# Patient Record
Sex: Female | Born: 1945 | Race: White | Hispanic: No | Marital: Married | Smoking: Former smoker
Health system: Southern US, Community
[De-identification: ages and names within clinical notes are randomized; demographics above are authoritative.]

## PROBLEM LIST (undated history)

## (undated) DIAGNOSIS — F32A Depression, unspecified: Secondary | ICD-10-CM

## (undated) DIAGNOSIS — M1990 Unspecified osteoarthritis, unspecified site: Secondary | ICD-10-CM

## (undated) DIAGNOSIS — Z973 Presence of spectacles and contact lenses: Secondary | ICD-10-CM

## (undated) DIAGNOSIS — F419 Anxiety disorder, unspecified: Secondary | ICD-10-CM

## (undated) DIAGNOSIS — E785 Hyperlipidemia, unspecified: Secondary | ICD-10-CM

## (undated) DIAGNOSIS — K5730 Diverticulosis of large intestine without perforation or abscess without bleeding: Secondary | ICD-10-CM

## (undated) DIAGNOSIS — K589 Irritable bowel syndrome without diarrhea: Secondary | ICD-10-CM

## (undated) DIAGNOSIS — I671 Cerebral aneurysm, nonruptured: Secondary | ICD-10-CM

## (undated) DIAGNOSIS — M1712 Unilateral primary osteoarthritis, left knee: Secondary | ICD-10-CM

## (undated) DIAGNOSIS — F329 Major depressive disorder, single episode, unspecified: Secondary | ICD-10-CM

## (undated) HISTORY — PX: VAGINAL HYSTERECTOMY: SUR661

## (undated) HISTORY — PX: TOTAL KNEE ARTHROPLASTY: SHX125

## (undated) HISTORY — PX: COLONOSCOPY: SHX174

## (undated) HISTORY — PX: TONSILLECTOMY AND ADENOIDECTOMY: SUR1326

## (undated) HISTORY — DX: Unspecified osteoarthritis, unspecified site: M19.90

## (undated) HISTORY — DX: Hyperlipidemia, unspecified: E78.5

## (undated) HISTORY — DX: Major depressive disorder, single episode, unspecified: F32.9

## (undated) HISTORY — DX: Depression, unspecified: F32.A

## (undated) HISTORY — PX: LUMBAR FUSION: SHX111

## (undated) HISTORY — DX: Anxiety disorder, unspecified: F41.9

## (undated) HISTORY — PX: ANEURYSM COILING: SHX5349

## (undated) HISTORY — PX: RECTAL PROLAPSE REPAIR: SHX759

---

## 1999-01-30 ENCOUNTER — Other Ambulatory Visit: Admission: RE | Admit: 1999-01-30 | Discharge: 1999-01-30 | Payer: Self-pay | Admitting: Family Medicine

## 2000-08-18 ENCOUNTER — Other Ambulatory Visit: Admission: RE | Admit: 2000-08-18 | Discharge: 2000-08-18 | Payer: Self-pay | Admitting: Family Medicine

## 2001-04-27 ENCOUNTER — Encounter: Admission: RE | Admit: 2001-04-27 | Discharge: 2001-04-27 | Payer: Self-pay | Admitting: Family Medicine

## 2001-04-27 ENCOUNTER — Encounter: Payer: Self-pay | Admitting: Family Medicine

## 2003-03-21 ENCOUNTER — Other Ambulatory Visit: Admission: RE | Admit: 2003-03-21 | Discharge: 2003-03-21 | Payer: Self-pay | Admitting: Family Medicine

## 2004-03-15 DIAGNOSIS — I671 Cerebral aneurysm, nonruptured: Secondary | ICD-10-CM

## 2004-03-15 HISTORY — DX: Cerebral aneurysm, nonruptured: I67.1

## 2004-08-27 ENCOUNTER — Ambulatory Visit: Payer: Self-pay | Admitting: Family Medicine

## 2004-09-02 ENCOUNTER — Encounter: Admission: RE | Admit: 2004-09-02 | Discharge: 2004-09-02 | Payer: Self-pay | Admitting: Family Medicine

## 2004-09-03 ENCOUNTER — Ambulatory Visit: Payer: Self-pay | Admitting: Family Medicine

## 2004-09-03 ENCOUNTER — Encounter: Admission: RE | Admit: 2004-09-03 | Discharge: 2004-09-03 | Payer: Self-pay | Admitting: Family Medicine

## 2004-12-03 ENCOUNTER — Ambulatory Visit: Payer: Self-pay | Admitting: Family Medicine

## 2004-12-23 ENCOUNTER — Encounter: Payer: Self-pay | Admitting: Family Medicine

## 2005-01-08 ENCOUNTER — Ambulatory Visit: Payer: Self-pay | Admitting: Family Medicine

## 2005-01-12 ENCOUNTER — Ambulatory Visit: Payer: Self-pay | Admitting: Family Medicine

## 2005-01-16 ENCOUNTER — Encounter: Admission: RE | Admit: 2005-01-16 | Discharge: 2005-01-16 | Payer: Self-pay | Admitting: Family Medicine

## 2005-01-21 ENCOUNTER — Ambulatory Visit (HOSPITAL_COMMUNITY): Admission: RE | Admit: 2005-01-21 | Discharge: 2005-01-22 | Payer: Self-pay | Admitting: Interventional Radiology

## 2005-04-06 ENCOUNTER — Encounter: Payer: Self-pay | Admitting: Family Medicine

## 2005-04-06 ENCOUNTER — Ambulatory Visit: Payer: Self-pay | Admitting: Family Medicine

## 2005-04-07 ENCOUNTER — Encounter: Payer: Self-pay | Admitting: Family Medicine

## 2005-04-07 ENCOUNTER — Other Ambulatory Visit: Admission: RE | Admit: 2005-04-07 | Discharge: 2005-04-07 | Payer: Self-pay | Admitting: Family Medicine

## 2005-04-07 LAB — CONVERTED CEMR LAB

## 2005-05-18 ENCOUNTER — Ambulatory Visit (HOSPITAL_COMMUNITY): Admission: RE | Admit: 2005-05-18 | Discharge: 2005-05-18 | Payer: Self-pay | Admitting: Interventional Radiology

## 2005-05-25 ENCOUNTER — Ambulatory Visit: Payer: Self-pay | Admitting: Family Medicine

## 2005-06-07 ENCOUNTER — Inpatient Hospital Stay (HOSPITAL_COMMUNITY): Admission: RE | Admit: 2005-06-07 | Discharge: 2005-06-11 | Payer: Self-pay | Admitting: Orthopedic Surgery

## 2005-07-14 ENCOUNTER — Ambulatory Visit: Payer: Self-pay | Admitting: Family Medicine

## 2005-07-28 ENCOUNTER — Ambulatory Visit: Payer: Self-pay | Admitting: Family Medicine

## 2005-10-27 ENCOUNTER — Ambulatory Visit: Payer: Self-pay | Admitting: Family Medicine

## 2006-01-07 ENCOUNTER — Ambulatory Visit (HOSPITAL_COMMUNITY): Admission: RE | Admit: 2006-01-07 | Discharge: 2006-01-07 | Payer: Self-pay | Admitting: Interventional Radiology

## 2006-01-19 ENCOUNTER — Ambulatory Visit: Payer: Self-pay | Admitting: Family Medicine

## 2006-01-26 ENCOUNTER — Ambulatory Visit: Payer: Self-pay | Admitting: Family Medicine

## 2006-01-26 LAB — CONVERTED CEMR LAB
AST: 34 units/L (ref 0–37)
BUN: 16 mg/dL (ref 6–23)
Basophils Relative: 1 % (ref 0.0–1.0)
Calcium: 9.5 mg/dL (ref 8.4–10.5)
Chloride: 103 meq/L (ref 96–112)
Cholesterol: 191 mg/dL (ref 0–200)
Creatinine, Ser: 0.8 mg/dL (ref 0.4–1.2)
Eosinophil percent: 1.7 % (ref 0.0–5.0)
HCT: 41.5 % (ref 36.0–46.0)
HDL: 74 mg/dL (ref >39.0)
Hemoglobin: 13.9 g/dL (ref 12.0–15.0)
Lymphocytes Relative: 31.7 % (ref 12.0–46.0)
MCHC: 33.4 g/dL (ref 30.0–36.0)
MCV: 98.7 fL (ref 78.0–100.0)
Monocytes Absolute: 0.5 10*3/uL (ref 0.2–0.7)
Neutro Abs: 3.5 10*3/uL (ref 1.4–7.7)
Neutrophils Relative %: 56.9 % (ref 43.0–77.0)
Triglyceride fasting, serum: 67 mg/dL (ref 0–149)
VLDL: 13 mg/dL (ref 0–40)

## 2006-02-09 ENCOUNTER — Ambulatory Visit: Payer: Self-pay | Admitting: Family Medicine

## 2006-04-13 ENCOUNTER — Ambulatory Visit: Payer: Self-pay | Admitting: Family Medicine

## 2006-04-13 LAB — CONVERTED CEMR LAB
Total CHOL/HDL Ratio: 3.1
Triglycerides: 83 mg/dL (ref 0–149)
VLDL: 17 mg/dL (ref 0–40)

## 2006-04-20 ENCOUNTER — Ambulatory Visit: Payer: Self-pay | Admitting: Family Medicine

## 2006-08-02 ENCOUNTER — Ambulatory Visit (HOSPITAL_COMMUNITY): Admission: RE | Admit: 2006-08-02 | Discharge: 2006-08-02 | Payer: Self-pay | Admitting: Interventional Radiology

## 2006-09-01 ENCOUNTER — Ambulatory Visit: Payer: Self-pay | Admitting: Family Medicine

## 2006-09-20 ENCOUNTER — Encounter: Payer: Self-pay | Admitting: Family Medicine

## 2006-10-21 ENCOUNTER — Ambulatory Visit: Payer: Self-pay | Admitting: Family Medicine

## 2006-10-26 ENCOUNTER — Encounter: Payer: Self-pay | Admitting: Family Medicine

## 2006-10-27 LAB — CONVERTED CEMR LAB
Cholesterol: 230 mg/dL (ref 0–200)
VLDL: 12 mg/dL (ref 0–40)

## 2006-11-02 ENCOUNTER — Telehealth: Payer: Self-pay | Admitting: Family Medicine

## 2007-01-24 ENCOUNTER — Telehealth: Payer: Self-pay | Admitting: Family Medicine

## 2007-02-17 ENCOUNTER — Ambulatory Visit: Payer: Self-pay | Admitting: Family Medicine

## 2007-02-17 LAB — CONVERTED CEMR LAB
Glucose, Urine, Semiquant: NEGATIVE
Ketones, urine, test strip: NEGATIVE
Urobilinogen, UA: 0.2
pH: 6

## 2007-02-28 ENCOUNTER — Encounter: Payer: Self-pay | Admitting: Family Medicine

## 2007-02-28 ENCOUNTER — Other Ambulatory Visit: Admission: RE | Admit: 2007-02-28 | Discharge: 2007-02-28 | Payer: Self-pay | Admitting: Family Medicine

## 2007-02-28 ENCOUNTER — Ambulatory Visit: Payer: Self-pay | Admitting: Family Medicine

## 2007-02-28 DIAGNOSIS — M67919 Unspecified disorder of synovium and tendon, unspecified shoulder: Secondary | ICD-10-CM | POA: Insufficient documentation

## 2007-02-28 DIAGNOSIS — M719 Bursopathy, unspecified: Secondary | ICD-10-CM

## 2007-02-28 LAB — CONVERTED CEMR LAB
Albumin: 4.3 g/dL (ref 3.5–5.2)
Alkaline Phosphatase: 47 units/L (ref 39–117)
BUN: 13 mg/dL (ref 6–23)
Basophils Absolute: 0 10*3/uL (ref 0.0–0.1)
Cholesterol: 174 mg/dL (ref 0–200)
Creatinine, Ser: 0.6 mg/dL (ref 0.4–1.2)
Eosinophils Absolute: 0.1 10*3/uL (ref 0.0–0.6)
GFR calc Af Amer: 132 mL/min
GFR calc non Af Amer: 109 mL/min
HCT: 39 % (ref 36.0–46.0)
HDL: 79.5 mg/dL (ref >39.0)
Hemoglobin: 13.4 g/dL (ref 12.0–15.0)
LDL Cholesterol: 84 mg/dL (ref 0–99)
Lymphocytes Relative: 31.3 % (ref 12.0–46.0)
MCHC: 34.3 g/dL (ref 30.0–36.0)
MCV: 96.5 fL (ref 78.0–100.0)
Monocytes Absolute: 0.4 10*3/uL (ref 0.2–0.7)
Monocytes Relative: 7.5 % (ref 3.0–11.0)
Neutro Abs: 3.5 10*3/uL (ref 1.4–7.7)
Neutrophils Relative %: 59.3 % (ref 43.0–77.0)
Potassium: 4.6 meq/L (ref 3.5–5.1)
RDW: 11.7 % (ref 11.5–14.6)
Sodium: 141 meq/L (ref 135–145)
TSH: 1.31 microintl units/mL (ref 0.35–5.50)
Total Bilirubin: 0.7 mg/dL (ref 0.3–1.2)

## 2007-03-07 ENCOUNTER — Encounter: Payer: Self-pay | Admitting: Family Medicine

## 2007-05-18 ENCOUNTER — Encounter: Payer: Self-pay | Admitting: Family Medicine

## 2007-05-18 ENCOUNTER — Encounter: Admission: RE | Admit: 2007-05-18 | Discharge: 2007-05-18 | Payer: Self-pay | Admitting: Family Medicine

## 2007-05-24 ENCOUNTER — Telehealth: Payer: Self-pay | Admitting: Family Medicine

## 2007-10-10 ENCOUNTER — Encounter: Payer: Self-pay | Admitting: Family Medicine

## 2007-10-10 ENCOUNTER — Ambulatory Visit: Payer: Self-pay | Admitting: Family Medicine

## 2007-10-10 ENCOUNTER — Other Ambulatory Visit: Admission: RE | Admit: 2007-10-10 | Discharge: 2007-10-10 | Payer: Self-pay | Admitting: Family Medicine

## 2007-11-08 ENCOUNTER — Telehealth: Payer: Self-pay | Admitting: Family Medicine

## 2007-12-19 ENCOUNTER — Telehealth: Payer: Self-pay | Admitting: Family Medicine

## 2008-03-27 ENCOUNTER — Telehealth: Payer: Self-pay | Admitting: Family Medicine

## 2008-03-29 ENCOUNTER — Telehealth: Payer: Self-pay | Admitting: Family Medicine

## 2008-03-29 ENCOUNTER — Ambulatory Visit: Payer: Self-pay | Admitting: Family Medicine

## 2008-04-03 ENCOUNTER — Ambulatory Visit: Payer: Self-pay | Admitting: Family Medicine

## 2008-04-03 ENCOUNTER — Encounter: Payer: Self-pay | Admitting: Family Medicine

## 2008-04-03 ENCOUNTER — Other Ambulatory Visit: Admission: RE | Admit: 2008-04-03 | Discharge: 2008-04-03 | Payer: Self-pay | Admitting: *Deleted

## 2008-04-03 ENCOUNTER — Telehealth: Payer: Self-pay

## 2008-04-03 DIAGNOSIS — S335XXA Sprain of ligaments of lumbar spine, initial encounter: Secondary | ICD-10-CM

## 2008-04-03 DIAGNOSIS — S339XXA Sprain of unspecified parts of lumbar spine and pelvis, initial encounter: Secondary | ICD-10-CM | POA: Insufficient documentation

## 2008-04-04 LAB — CONVERTED CEMR LAB
Albumin: 4.2 g/dL (ref 3.5–5.2)
BUN: 21 mg/dL (ref 6–23)
Basophils Absolute: 0 10*3/uL (ref 0.0–0.1)
Basophils Relative: 1.1 % (ref 0.0–3.0)
Calcium: 9.6 mg/dL (ref 8.4–10.5)
Cholesterol: 180 mg/dL (ref 0–200)
Creatinine, Ser: 0.7 mg/dL (ref 0.4–1.2)
Eosinophils Absolute: 0.1 10*3/uL (ref 0.0–0.7)
Eosinophils Relative: 3.3 % (ref 0.0–5.0)
GFR calc Af Amer: 109 mL/min
GFR calc non Af Amer: 90 mL/min
HCT: 38.4 % (ref 36.0–46.0)
HDL: 82.7 mg/dL (ref >39.0)
MCHC: 34.4 g/dL (ref 30.0–36.0)
MCV: 99.3 fL (ref 78.0–100.0)
Monocytes Absolute: 0.3 10*3/uL (ref 0.1–1.0)
Platelets: 251 10*3/uL (ref 150–400)
RBC: 3.86 M/uL — ABNORMAL LOW (ref 3.87–5.11)
TSH: 0.94 microintl units/mL (ref 0.35–5.50)
Total Bilirubin: 0.8 mg/dL (ref 0.3–1.2)
WBC: 4.5 10*3/uL (ref 4.5–10.5)

## 2008-04-09 ENCOUNTER — Encounter: Payer: Self-pay | Admitting: Family Medicine

## 2008-05-29 ENCOUNTER — Ambulatory Visit: Payer: Self-pay | Admitting: Family Medicine

## 2008-06-13 ENCOUNTER — Telehealth: Payer: Self-pay | Admitting: Family Medicine

## 2008-06-20 ENCOUNTER — Ambulatory Visit: Payer: Self-pay | Admitting: Family Medicine

## 2008-06-20 ENCOUNTER — Telehealth: Payer: Self-pay | Admitting: Family Medicine

## 2008-06-20 DIAGNOSIS — J069 Acute upper respiratory infection, unspecified: Secondary | ICD-10-CM | POA: Insufficient documentation

## 2008-06-21 ENCOUNTER — Ambulatory Visit: Payer: Self-pay | Admitting: Family Medicine

## 2008-06-27 ENCOUNTER — Ambulatory Visit: Payer: Self-pay | Admitting: Internal Medicine

## 2008-07-03 ENCOUNTER — Telehealth: Payer: Self-pay | Admitting: Family Medicine

## 2008-07-03 LAB — CONVERTED CEMR LAB
Bilirubin Urine: NEGATIVE
Blood in Urine, dipstick: NEGATIVE
Glucose, Urine, Semiquant: NEGATIVE
Protein, U semiquant: NEGATIVE
Specific Gravity, Urine: 1.02
pH: 7

## 2008-07-09 ENCOUNTER — Encounter: Payer: Self-pay | Admitting: Family Medicine

## 2008-07-10 ENCOUNTER — Encounter: Payer: Self-pay | Admitting: Family Medicine

## 2008-07-16 ENCOUNTER — Encounter: Payer: Self-pay | Admitting: Family Medicine

## 2008-08-09 ENCOUNTER — Encounter: Payer: Self-pay | Admitting: Family Medicine

## 2008-10-25 ENCOUNTER — Telehealth: Payer: Self-pay | Admitting: Family Medicine

## 2008-10-29 ENCOUNTER — Encounter: Payer: Self-pay | Admitting: Family Medicine

## 2008-10-29 ENCOUNTER — Encounter: Admission: RE | Admit: 2008-10-29 | Discharge: 2008-10-29 | Payer: Self-pay | Admitting: Internal Medicine

## 2008-11-06 ENCOUNTER — Ambulatory Visit: Payer: Self-pay | Admitting: Family Medicine

## 2008-11-06 DIAGNOSIS — M109 Gout, unspecified: Secondary | ICD-10-CM | POA: Insufficient documentation

## 2009-03-31 ENCOUNTER — Ambulatory Visit: Payer: Self-pay | Admitting: Family Medicine

## 2009-04-15 ENCOUNTER — Other Ambulatory Visit: Admission: RE | Admit: 2009-04-15 | Discharge: 2009-04-15 | Payer: Self-pay | Admitting: Internal Medicine

## 2009-04-15 ENCOUNTER — Ambulatory Visit: Payer: Self-pay | Admitting: Family Medicine

## 2009-04-15 DIAGNOSIS — B369 Superficial mycosis, unspecified: Secondary | ICD-10-CM | POA: Insufficient documentation

## 2009-04-15 DIAGNOSIS — N6019 Diffuse cystic mastopathy of unspecified breast: Secondary | ICD-10-CM | POA: Insufficient documentation

## 2009-04-15 LAB — HM PAP SMEAR

## 2009-04-24 LAB — CONVERTED CEMR LAB
Albumin: 4.5 g/dL (ref 3.5–5.2)
Basophils Relative: 1.2 % (ref 0.0–3.0)
Bilirubin, Direct: 0.1 mg/dL (ref 0.0–0.3)
CO2: 29 meq/L (ref 19–32)
Chloride: 104 meq/L (ref 96–112)
Cholesterol: 187 mg/dL (ref 0–200)
Creatinine, Ser: 0.7 mg/dL (ref 0.4–1.2)
Eosinophils Absolute: 0.1 10*3/uL (ref 0.0–0.7)
Glucose, Bld: 92 mg/dL (ref 70–99)
Glucose, Urine, Semiquant: NEGATIVE
MCHC: 33.2 g/dL (ref 30.0–36.0)
MCV: 101 fL — ABNORMAL HIGH (ref 78.0–100.0)
Monocytes Absolute: 0.3 10*3/uL (ref 0.1–1.0)
Neutro Abs: 2.6 10*3/uL (ref 1.4–7.7)
Neutrophils Relative %: 59.3 % (ref 43.0–77.0)
Nitrite: NEGATIVE
Pap Smear: NEGATIVE
RBC: 3.99 M/uL (ref 3.87–5.11)
Specific Gravity, Urine: 1.02
Total CHOL/HDL Ratio: 2
Total Protein: 6.8 g/dL (ref 6.0–8.3)
Triglycerides: 55 mg/dL (ref 0.0–149.0)
pH: 6.5

## 2009-05-01 ENCOUNTER — Ambulatory Visit: Payer: Self-pay | Admitting: Family Medicine

## 2009-05-21 ENCOUNTER — Telehealth: Payer: Self-pay | Admitting: Family Medicine

## 2009-05-29 ENCOUNTER — Telehealth: Payer: Self-pay | Admitting: Family Medicine

## 2009-06-03 ENCOUNTER — Telehealth: Payer: Self-pay | Admitting: Family Medicine

## 2009-10-09 ENCOUNTER — Ambulatory Visit: Payer: Self-pay | Admitting: Family Medicine

## 2009-10-09 DIAGNOSIS — T148XXA Other injury of unspecified body region, initial encounter: Secondary | ICD-10-CM | POA: Insufficient documentation

## 2009-11-23 ENCOUNTER — Ambulatory Visit (HOSPITAL_COMMUNITY): Admission: RE | Admit: 2009-11-23 | Discharge: 2009-11-23 | Payer: Self-pay | Admitting: Family Medicine

## 2009-11-24 ENCOUNTER — Telehealth (INDEPENDENT_AMBULATORY_CARE_PROVIDER_SITE_OTHER): Payer: Self-pay | Admitting: *Deleted

## 2010-02-02 ENCOUNTER — Telehealth: Payer: Self-pay | Admitting: Family Medicine

## 2010-03-26 ENCOUNTER — Encounter: Payer: Self-pay | Admitting: Family Medicine

## 2010-04-04 ENCOUNTER — Encounter: Payer: Self-pay | Admitting: Interventional Radiology

## 2010-04-06 ENCOUNTER — Encounter: Payer: Self-pay | Admitting: Internal Medicine

## 2010-04-14 NOTE — Assessment & Plan Note (Signed)
Summary: 2 wk rov/jlp   Vital Signs:  Patient profile:   65 year old female Menstrual status:  partial hysterectomy Weight:      117 pounds O2 Sat:      98 % on Room air Temp:     98.3 degrees F oral Pulse rate:   74 / minute Pulse rhythm:   regular BP sitting:   132 / 72  (left arm) Cuff size:   regular  O2 Flow:  Room air CC: check her breast, bump on her bottom & left shoulder pain Is Patient Diabetic? No     Menstrual Status partial hysterectomy Last PAP Result NEGATIVE FOR INTRAEPITHELIAL LESIONS OR MALIGNANCY.   History of Present Illness: This 65 year old white female returned for reexamination of the breast, 2 weeks previously she was found to have a cystic mastitis in the left breast and has returned for followup examination. She states she is improved and almost no tenderness at this time. The problem with the cellulitis on the left labia has resolved according to patient Her other complaint at this time is pain in the left shoulder on movement but also causes pain at night Headaches are much improved and less frequent Irritable bowel syndrome is well controlled with medication, hyocyaminee  Allergies: 1)  ! 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 Anxiety obesity  Past Surgical History: Last updated: 09/20/2006 Total knee replacement Tonsillectomy Fx Ankle Caesarean section Rectal Prolapse  Risk Factors: Smoking Status: quit (09/20/2006)  Review of Systems  The patient denies anorexia, fever, weight loss, weight gain, vision loss, decreased hearing, hoarseness, chest pain, syncope, dyspnea on exertion, peripheral edema, prolonged cough, headaches, hemoptysis, abdominal pain, melena, hematochezia, severe indigestion/heartburn, hematuria, incontinence, genital sores, muscle weakness, suspicious skin lesions,  transient blindness, difficulty walking, depression, unusual weight change, abnormal bleeding, enlarged lymph nodes, angioedema, breast masses, and testicular masses.    Physical Exam  General:  Well-developed,well-nourished,in no acute distress; alert,appropriate and cooperative throughout examination Breasts:  breast are normal to examination bilaterally very minimal tenderness in her right lower quadrant of the left wrist but no evidence of fibrocystic breast Lungs:  Normal respiratory effort, chest expands symmetrically. Lungs are clear to auscultation, no crackles or wheezes. Heart:  Normal rate and regular rhythm. S1 and S2 normal without gallop, murmur, click, rub or other extra sounds. Genitalia:  absent uterus, no evidence of infection the lesion on the left labia   Impression & Recommendations:  Problem # 1:  FUNGAL DERMATITIS (ICD-111.9) Assessment Improved  The following medications were removed from the medication list:    Lotrisone 1-0.05 % Crea (Clotrimazole-betamethasone) .Marland Kitchen... 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 Take 1 three times a day as needed 2)  Aspirin 81 Mg Tbec (Aspirin) .... Take 1 tablet by mouth every morning 3)  Imipramine Hcl  50 Mg Tabs (Imipramine hcl) .... Take 3 at bedtime 4)  Lipitor 20 Mg Tabs (Atorvastatin calcium) .... Once daily 5)  Diphenoxylate-atropine 2.5-0.025 Mg Tabs (Diphenoxylate-atropine) .... 2 tablets now,then 2 tablets four times day for diarrhea 6)  Diclofenac Sodium 75 Mg Tbec (Diclofenac sodium) .Marland Kitchen.. 1 tab two times a day after meals for arthritis 7)  Premarin 0.625 Mg/gm Crea (Estrogens, conjugated) .... Insert 1/2 applicator hs and apply externally as instructed 8)  Lorcet 10/650 10-650 Mg Tabs (Hydrocodone-acetaminophen) .Marland Kitchen.. 1 q4h as needed pain 9)  Doxycycline Hyclate 100 Mg Caps (Doxycycline hyclate) .Marland Kitchen.. 1 two times a day for infection breast 10)  Hyoscyamine Sulfate Cr 0.375 Mg Xr12h-tab (Hyoscyamine sulfate) .Marland Kitchen.. 1 two times a day for ibs  Patient Instructions: 1)  cystic mastitis and breast have improved and no further medication is needed 2)  Local dermatitis and saline this improved 3)  Injected the left a.c. bursa with Depo-Medrol 120 mg +1.5 cc lidocaine 1% 4)  Return when needed   Orders Added: 1)  Est. Patient Level IV [68864] 2)  Joint Aspirate / Injection, Large [20610] 3)  Depo- Medrol 39m [J1030] 4)  Depo- Medrol 842m[J1040]

## 2010-04-14 NOTE — Progress Notes (Signed)
Summary: LMTCB  Phone Note Call from Patient   Caller: Patient Call For: Emeterio Reeve MD Summary of Call: pt left message on voice mail to speak to [facility].  Called her back and left message to call back Triage. [phone] Initial call taken by: Deanna Artis CMA,  May 21, 2009 11:41 AM  Follow-up for Phone Call        duplicate mess see next mess.  Follow-up by: Levora Angel, RN,  May 21, 2009 12:17 PM

## 2010-04-14 NOTE — Progress Notes (Signed)
  Phone Note Outgoing Call   Summary of Call: Per MD called pt and suggested that she come in and see Dr Charlett Blake or someone else at the office. Patient states she will call back tomorrow if she is not feeling any better. Initial call taken by: Gardenia Phlegm RMA,  November 24, 2009 3:19 PM

## 2010-04-14 NOTE — Assessment & Plan Note (Signed)
Summary: neck injury   Vital Signs:  Patient profile:   65 year old female Menstrual status:  partial hysterectomy Weight:      115 pounds Temp:     97.6 degrees F Pulse rate:   80 / minute BP sitting:   134 / 76  (left arm)  Vitals Entered By: Levora Angel, RN (October 09, 2009 2:23 PM) CC: earlier today was closing car trunk lid and it hit her on back of neck.   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 Continues to need to take diclofenac Problem of diarrhea and then much improved Continues to the alprazolam for stress of work and marital problems     Allergies: 1)  ! 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 Anxiety obesity  Past Surgical History: Last updated: 09/20/2006 Total knee replacement Tonsillectomy Fx Ankle Caesarean section Rectal Prolapse  Risk Factors: Smoking Status: quit (09/20/2006)  Review of Systems      See HPI  The patient denies anorexia, fever, weight loss, weight gain, vision loss, decreased hearing, hoarseness, chest pain, syncope, dyspnea on exertion, peripheral edema, prolonged cough, headaches, hemoptysis, abdominal pain, melena, hematochezia, severe indigestion/heartburn, hematuria, incontinence, genital sores, muscle weakness, suspicious skin lesions, transient blindness, difficulty walking, depression, unusual weight change, abnormal bleeding, enlarged lymph nodes, angioedema, breast masses, and testicular masses.    Physical Exam  General:  Well-developed,well-nourished,in no acute distress;  alert,appropriate and cooperative throughout examination Head:  Normocephalic and atraumatic without obvious abnormalities. No apparent alopecia or balding. Eyes:  No corneal or conjunctival inflammation noted. EOMI. Perrla. Funduscopic exam benign, without hemorrhages, exudates or papilledema. Vision grossly normal. Ears:  External ear exam shows no significant lesions or deformities.  Otoscopic examination reveals clear canals, tympanic membranes are intact bilaterally without bulging, retraction, inflammation or discharge. Hearing is grossly normal bilaterally. Nose:  External nasal examination shows no deformity or inflammation. Nasal mucosa are pink and moist without lesions or exudates. Mouth:  Oral mucosa and oropharynx without lesions or exudates.  Teeth in good repair. Neck:  tenderness over the lower area of the cervical spine light tenderness but no peripheral elbow complete range of motion Lungs:  Normal respiratory effort, chest expands symmetrically. Lungs are clear to auscultation, no crackles or wheezes. Heart:  Normal rate and regular rhythm. S1 and S2 normal without gallop, murmur, click, rub or other extra sounds. Msk:  neck examination reveals slight tenderness lower cervical spine no hematoma no purpura #4 range of motion   Impression & Recommendations:  Problem # 1:  CONTUSION OF UNSPECIFIED SITE (ICD-924.9) Assessment New symptomatic treatment no major injury  Problem # 2:  ANXIETY (ICD-300.00) Assessment: Unchanged  Her updated medication list for this problem includes:    Alprazolam 1 Mg Tabs (Alprazolam) .Marland Kitchen... Take 1 three times a day as needed    Imipramine Hcl 50 Mg Tabs (Imipramine hcl) .Marland Kitchen... Take 3 at bedtime  Problem # 3:  HEADACHE (ICD-784.0) Assessment: Unchanged  Her updated medication list for this problem includes:    Aspirin 81 Mg Tbec (Aspirin) .Marland Kitchen... Take 1 tablet by mouth every morning  Diclofenac Sodium 75 Mg Tbec (Diclofenac sodium) .Marland Kitchen... 1 tab  two times a day after meals for arthritis    Lorcet 10/650 10-650 Mg Tabs (Hydrocodone-acetaminophen) .Marland Kitchen... 1 q4h as needed pain  Complete Medication List: 1)  Alprazolam 1 Mg Tabs (Alprazolam) .... Take 1 three times a day as needed 2)  Aspirin 81 Mg Tbec (Aspirin) .... Take 1 tablet by mouth every morning 3)  Imipramine Hcl 50 Mg Tabs (Imipramine hcl) .... Take 3 at bedtime 4)  Lipitor 20 Mg Tabs (Atorvastatin calcium) .... Once daily 5)  Diphenoxylate-atropine 2.5-0.025 Mg Tabs (Diphenoxylate-atropine) .... 2 tablets now,then 2 tablets four times day for diarrhea 6)  Diclofenac Sodium 75 Mg Tbec (Diclofenac sodium) .Marland Kitchen.. 1 tab two times a day after meals for arthritis 7)  Premarin 0.625 Mg/gm Crea (Estrogens, conjugated) .... Insert 1/2 applicator hs and apply externally as instructed 8)  Lorcet 10/650 10-650 Mg Tabs (Hydrocodone-acetaminophen) .Marland Kitchen.. 1 q4h as needed pain 9)  Doxycycline Hyclate 100 Mg Caps (Doxycycline hyclate) .Marland Kitchen.. 1 two times a day for infection breast 10)  Hyoscyamine Sulfate Cr 0.375 Mg Xr12h-tab (Hyoscyamine sulfate) .Marland Kitchen.. 1 two times a day for ibs  Patient Instructions: 1)  only have a contusion of the lower neck 2)  Use analgesics as needed pain 4 use ice packs 20 minutes rest and use heat 3)  Continue other medications as prescribed Prescriptions: LORCET 10/650 10-650 MG  TABS (HYDROCODONE-ACETAMINOPHEN) 1 q4h as needed pain  #100 x 5   Entered and Authorized by:   Emeterio Reeve MD   Signed by:   Emeterio Reeve MD on 10/09/2009   Method used:   Print then Give to Patient   RxID:   [id number] ALPRAZOLAM 1 MG TABS (ALPRAZOLAM) take 1 three times a day as needed  #90 x 5   Entered and Authorized by:   Emeterio Reeve MD   Signed by:   Emeterio Reeve MD on 10/09/2009   Method used:   Print then Give to Patient   RxID:   [id number]

## 2010-04-14 NOTE — Progress Notes (Signed)
Summary: Rx estradiol 1 mg   Phone Note Call from Patient Call back at Home Phone [phone]   Caller: Patient Call For: Emeterio Reeve MD Summary of Call: said Dr Joni Fears was going to give her Rx for hormone pills and she never got. [facility] Initial call taken by: Chipper Oman, RN,  May 21, 2009 11:46 AM  Follow-up for Phone Call        pt called by dr Joni Fears  Follow-up by: Levora Angel, RN,  May 21, 2009 5:14 PM

## 2010-04-14 NOTE — Assessment & Plan Note (Signed)
Summary: cpx/cjr/pt rescd//ccm   Vital Signs:  Patient profile:   65 year old female Height:      59.5 inches Weight:      117 pounds BMI:     23.32 Temp:     97.5 degrees F Pulse rate:   72 / minute Pulse rhythm:   regular Resp:     12 per minute BP sitting:   110 / 78  Vitals Entered By: Deanna Artis CMA (April 15, 2009 1:36 PM) CC: cpx Is Patient Diabetic? No Pain Assessment Patient in pain? no        History of Present Illness: this 65 year old white married female is in for complete physical examination and discuss her multiple problems plan refill meds for medications. She had good experience with colonoscopic exam at [facility] gastroenterology and no abnormalities were found. Patient continues to have problems with IBS with diarrhea being the primary problem Patient had right knee replacement and function has not returned to 100% as desired Continues to have some pain in addition to this she continues to have some arthritic pain in the shoulders back hips and both knees Instructed to schedule a mammogram since she has not had one in several years. Complains of some vaginal itching and perineal region approaching the anus Continues to have considerable stress with her job as well as with her husband  Current Medications (verified): 1)  Alprazolam 1 Mg Tabs (Alprazolam) .... Take 1 Three Times A Day As Needed 2)  Aspirin 81 Mg Tbec (Aspirin) .... Take 1 Tablet By Mouth Every Morning 3)  Imipramine Hcl 50 Mg Tabs (Imipramine Hcl) .... Take 3 At Bedtime 4)  Lipitor 20 Mg  Tabs (Atorvastatin Calcium) .... Once Daily 5)  Diphenoxylate-Atropine 2.5-0.025 Mg  Tabs (Diphenoxylate-Atropine) .... 2 Tablets Now,then 2 Tablets Four Times Day 6)  Diclofenac Sodium 75 Mg  Tbec (Diclofenac Sodium) .Marland Kitchen.. 1 Tab Two Times A Day After Meals For Arthritis 7)  Premarin 0.625 Mg/gm  Crea (Estrogens, Conjugated) .... Insert 1/2 Applicator Hs and Apply Externally As Instructed 8)  Lorcet 10/650  10-650 Mg  Tabs (Hydrocodone-Acetaminophen) .Marland Kitchen.. 1 Q4h As Needed Pain  Allergies (verified): 1)  ! 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 Eyes:  Denies blurring, discharge, double vision, eye irritation, eye pain, halos, itching, light sensitivity, red eye, vision loss-1 eye, and vision loss-both eyes. ENT:  Denies decreased hearing, difficulty swallowing, ear discharge, earache, hoarseness, nasal congestion, nosebleeds, postnasal drainage, ringing in ears, sinus pressure, and sore throat. CV:  Denies bluish discoloration of lips or nails, chest pain or discomfort, difficulty breathing at night, difficulty breathing while lying down, fainting, fatigue, leg cramps with exertion, lightheadness, near fainting, palpitations, shortness of breath with exertion, swelling of feet, swelling of hands, and weight gain. Resp:  Denies chest discomfort, chest pain with inspiration, cough, coughing up blood, excessive snoring, hypersomnolence, morning headaches, pleuritic, shortness of breath, sputum productive, and wheezing. GI:  Complains of diarrhea. GU:  Complains of discharge. MS:  See HPI; Complains of joint pain. Derm:  Denies changes in color of skin, changes  in nail beds, dryness, excessive perspiration, flushing, hair loss, insect bite(s), itching, lesion(s), poor wound healing, and rash. Neuro:  Denies brief paralysis, difficulty with concentration, disturbances in coordination, falling down,  headaches, inability to speak, memory loss, numbness, poor balance, seizures, sensation of room spinning, tingling, tremors, visual disturbances, and weakness. Psych:  Complains of anxiety and depression.  Physical Exam  General:  Well-developed,well-nourished,in no acute distress; alert,appropriate and cooperative throughout examination Head:  Normocephalic and atraumatic without obvious abnormalities. No apparent alopecia or balding. Eyes:  No corneal or conjunctival inflammation noted. EOMI. Perrla. Funduscopic exam benign, without hemorrhages, exudates or papilledema. Vision grossly normal. Ears:  External ear exam shows no significant lesions or deformities.  Otoscopic examination reveals clear canals, tympanic membranes are intact bilaterally without bulging, retraction, inflammation or discharge. Hearing is grossly normal bilaterally. Nose:  External nasal examination shows no deformity or inflammation. Nasal mucosa are pink and moist without lesions or exudates. Mouth:  Oral mucosa and oropharynx without lesions or exudates.  Teeth in good repair. Neck:  No deformities, masses, or tenderness noted. Chest Wall:  No deformities, masses, or tenderness noted. Breasts:  fibrocystic breast left inner lower quadrant Lungs:  Normal respiratory effort, chest expands symmetrically. Lungs are clear to auscultation, no crackles or wheezes. Heart:  Normal rate and regular rhythm. S1 and S2 normal without gallop, murmur, click, rub or other extra sounds. Abdomen:  slightly hyperactive bowel soundsno masses, no guarding, no rigidity, no rebound tenderness, no abdominal hernia, no inguinal hernia, no hepatomegaly, and no splenomegaly.   Rectal:  perianal erythema I. fungal in nature otherwise rectal negative Genitalia:  Normal introitus for age, no external lesions, no vaginal discharge, mucosa pink and moist, no vaginal or cervical lesions, no vaginal atrophy, no friaility or hemorrhage, normal uterus  size and position, no adnexal masses or tenderness Msk:  scar right knee prosthesis; minimal tenderness subdeltoid bursa on the right Pulses:  R and L carotid,radial,femoral,dorsalis pedis and posterior tibial pulses are full and equal bilaterally Extremities:  No clubbing, cyanosis, edema, or deformity noted with normal full range of motion of all joints.   Neurologic:  No cranial nerve deficits noted. Station and gait are normal. Plantar reflexes are down-going bilaterally. DTRs are symmetrical throughout. Sensory, motor and coordinative functions appear intact. Skin:  scaly erythematous perianal skin between the vagina and Cervical Nodes:  No lymphadenopathy noted Axillary Nodes:  No palpable lymphadenopathy Inguinal Nodes:  No significant adenopathy Psych:  Cognition and judgment appear intact. Alert and cooperative with normal attention span and concentration. No apparent delusions, illusions, hallucinations   Impression & Recommendations:  Problem # 1:  PHYSICAL EXAMINATION (ICD-V70.0) Assessment Unchanged  Problem # 2:  IRRITABLE BOWEL SYNDROME (ICD-564.1) Assessment: New Hyoscyamine .375 two times a day,also take Lomotil his diarrhea is  Problem # 3:  FIBROCYSTIC BREAST DISEASE (ICD-610.1) Assessment: New doxycycline 100 mg b.i.d. 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 Stormy Card  Problem # 5:  BURSITIS, SUBDELTOID (ICD-726.10) Assessment: Improved  Problem # 6:  ANXIETY (ICD-300.00) Assessment: Unchanged  Her updated medication list for this problem includes:    Alprazolam 1 Mg Tabs (Alprazolam) .Marland Kitchen... Take 1 three times a day as needed    Imipramine Hcl 50 Mg Tabs (Imipramine hcl) .Marland Kitchen... Take 3 at bedtime  Problem # 7:  FUNGAL DERMATITIS (ICD-111.9) Assessment: New  Her updated medication list for this problem includes:    Lotrisone 1-0.05 % Crea (Clotrimazole-betamethasone) .Marland KitchenMarland KitchenMarland KitchenMarland Kitchen  Apply two  times a day edge of vagina two times a day for 10 days  Orders: Prescription Created Electronically [phone])  Complete Medication List: 1)  Alprazolam 1 Mg Tabs (Alprazolam) .... Take 1 three times a day as needed 2)  Aspirin 81 Mg Tbec (Aspirin) .... Take 1 tablet by mouth every morning 3)  Imipramine Hcl 50 Mg Tabs (Imipramine hcl) .... Take 3 at bedtime 4)  Lipitor 20 Mg Tabs (Atorvastatin calcium) .... Once daily 5)  Diphenoxylate-atropine 2.5-0.025 Mg Tabs (Diphenoxylate-atropine) .... 2 tablets now,then 2 tablets four times day for diarrhea 6)  Diclofenac Sodium 75 Mg Tbec (Diclofenac sodium) .Marland Kitchen.. 1 tab two times a day after meals for arthritis 7)  Premarin 0.625 Mg/gm Crea (Estrogens, conjugated) .... Insert 1/2 applicator hs and apply externally as instructed 8)  Lorcet 10/650 10-650 Mg Tabs (Hydrocodone-acetaminophen) .Marland Kitchen.. 1 q4h as needed pain 9)  Lotrisone 1-0.05 % Crea (Clotrimazole-betamethasone) .... Apply two times a day edge of vagina two times a day for 10 days 10)  Doxycycline Hyclate 100 Mg Caps (Doxycycline hyclate) .Marland Kitchen.. 1 two times a day for infection breast 11)  Hyoscyamine Sulfate Cr 0.375 Mg Xr12h-tab (Hyoscyamine sulfate) .Marland Kitchen.. 1 two times a day for ibs  Patient Instructions: 1)  you have cystic mastitis and I am treating her with doxycycline as well as your decrease in caffeine intake 2)  Perianal itching to be treated with Lotrisone b.i.d. 3)  Continue other medications as instructed 4)  Return in 2 weeks for reexamination of breast and pelvic Prescriptions: HYOSCYAMINE SULFATE CR 0.375 MG XR12H-TAB (HYOSCYAMINE SULFATE) 1 two times a day for IBS  #60 x 11   Entered and Authorized by:   Emeterio Reeve MD   Signed by:   Emeterio Reeve MD on 04/15/2009   Method used:   Electronically to        [facility]. [phone]* (retail)       [address]       [place], [place]  [address]       Ph: [phone]       Fax:  [phone]   RxID:   [id number] DIPHENOXYLATE-ATROPINE 2.5-0.025 MG  TABS (DIPHENOXYLATE-ATROPINE) 2 tablets now,then 2 tablets four times day for diarrhea  #60 x 5   Entered and Authorized by:   Emeterio Reeve MD   Signed by:   Emeterio Reeve MD on 04/15/2009   Method used:   Print then Give to Patient   RxID:   [id number] LORCET 10/650 10-650 MG  TABS (HYDROCODONE-ACETAMINOPHEN) 1 q4h as needed pain  #100 x 5   Entered and Authorized by:   Emeterio Reeve MD   Signed by:   Emeterio Reeve MD on 04/15/2009   Method used:   Print then Give to Patient   RxID:   [id number] ALPRAZOLAM 1 MG TABS (ALPRAZOLAM) take 1 three times a day as needed  #90 x 5   Entered and Authorized by:   Emeterio Reeve MD   Signed by:   Emeterio Reeve MD on 04/15/2009   Method used:   Print then Give to Patient   RxID:   [id number] DOXYCYCLINE HYCLATE 100 MG CAPS (DOXYCYCLINE HYCLATE) 1 two times a day for infection breast  #30 x 1   Entered and Authorized by:   Emeterio Reeve MD   Signed by:  Emeterio Reeve MD on 04/15/2009   Method used:   Electronically to        [facility]. [phone]* (retail)       [address]       [place], [place]  [address]       Ph: [phone]       Fax: [phone]   RxID:   [id number] [place] 1-0.05 % CREA (CLOTRIMAZOLE-BETAMETHASONE) apply two times a day edge of vagina two times a day for 10 days  #30 gms x 5   Entered and Authorized by:   Emeterio Reeve MD   Signed by:   Emeterio Reeve MD on 04/15/2009   Method used:   Electronically to        [facility]. [phone]* (retail)       [address]       [place], [place]  [address]       Ph: [phone]       Fax: [phone]   RxID:   [id number] PREMARIN 0.625 MG/GM  CREA (ESTROGENS, CONJUGATED) insert 1/2 applicator hs and apply externally as instructed  #45 gms x  11   Entered and Authorized by:   Emeterio Reeve MD   Signed by:   Emeterio Reeve MD on 04/15/2009   Method used:   Electronically to        [facility] [phone]* (retail)       [address]       [place], [place]  [address]       Ph: [phone]       Fax: [phone]   RxID:   [id number] DICLOFENAC SODIUM 75 MG  TBEC (DICLOFENAC SODIUM) 1 tab two times a day after meals for arthritis  #60 x 11   Entered and Authorized by:   Emeterio Reeve MD   Signed by:   Emeterio Reeve MD on 04/15/2009   Method used:   Electronically to        [facility]. [phone]* (retail)       [place], [place]  [address]       Ph: [phone]       Fax: [phone]   RxID:   [id number] LIPITOR 20 MG  TABS (ATORVASTATIN CALCIUM) once daily  #30 x 11   Entered and Authorized by:   Emeterio Reeve MD   Signed by:   Emeterio Reeve MD on 04/15/2009   Method used:   Electronically to        [facility] [phone]* (retail)       [place], [place]  [address]       Ph: [phone]       Fax: [phone]   RxID:   [id number] IMIPRAMINE HCL 50 MG TABS (IMIPRAMINE HCL) take 3 at bedtime  #180 x 11   Entered and Authorized by:   Emeterio Reeve MD   Signed by:   Emeterio Reeve MD on 04/15/2009   Method used:   Electronically to  [facility] [address] (retail)       [address]       [place], [place]  [address]       Ph: [phone] or [phone]       Fax: [phone]   RxID:   [id number]

## 2010-04-14 NOTE — Progress Notes (Signed)
Summary: wants mammogram  Phone Note Call from Patient Call back at Home Phone [phone]   Caller: Patient---triage vm Reason for Call: Referral Summary of Call: wants an order to get a mammogram. having left breast soreness. please advise. Initial call taken by: Despina Arias,  February 02, 2010 1:24 PM  Follow-up for Phone Call        she needs an OV first to examine the breast  Follow-up by: Laurey Morale MD,  February 02, 2010 3:40 PM  Additional Follow-up for Phone Call Additional follow up Details #1::        Pt states she prefers to see Dr. Joni Fears when he returns.  States she will call [facility] to see if she can get an appoinment for mammogram without a referral. Additional Follow-up by: Candace Cruise,  February 02, 2010 3:52 PM

## 2010-04-14 NOTE — Progress Notes (Signed)
Summary: questions about premarin   Phone Note Call from Patient   Caller: Patient Summary of Call: questins about hormone pill premarin  Initial call taken by: Levora Angel, RN,  June 03, 2009 9:28 AM  Follow-up for Phone Call        call back number [phone]  Follow-up by: Levora Angel, RN,  June 03, 2009 9:29 AM  Additional Follow-up for Phone Call Additional follow up Details #1::        notified by dr Joni Fears and to stop the premarin  and use the generic estradiol  Additional Follow-up by: Levora Angel, RN,  June 03, 2009 9:40 AM

## 2010-04-14 NOTE — Progress Notes (Signed)
Summary: sadness on Estradiol  Phone Note Call from Patient   Caller: Patient Call For: Cassandra Reeve MD Summary of Call: [facility] from voice mail left earlier.  Pt. thinks Estradiol is making her sad.  Is there anything else she can take that will not do this? [phone] Initial call taken by: Deanna Artis CMA,  May 29, 2009 4:16 PM  Follow-up for Phone Call        CHANGE TO PREMARIN 0 0.625 instead of estradiol   Pt notifed.  Sent to [facility] ([facility]) Deanna Artis CMA  May 29, 2009 5:00 PM  Follow-up by: Levora Angel, RN,  May 29, 2009 4:57 PM    New/Updated Medications: PREMARIN 0.625 MG TABS (ESTROGENS CONJUGATED) one by mouth daily Prescriptions: PREMARIN 0.625 MG TABS (ESTROGENS CONJUGATED) one by mouth daily  #30 x 5   Entered by:   Deanna Artis CMA   Authorized by:   Cassandra Reeve MD   Signed by:   Deanna Artis CMA on 05/29/2009   Method used:   Electronically to        [facility] [place] (retail)       [address]       [place], [place]  [address]       Ph: [phone]       Fax: [phone]   RxID:   [id number]

## 2010-04-15 ENCOUNTER — Encounter: Payer: Self-pay | Admitting: Family Medicine

## 2010-04-15 ENCOUNTER — Telehealth: Payer: Self-pay | Admitting: *Deleted

## 2010-04-15 ENCOUNTER — Ambulatory Visit (INDEPENDENT_AMBULATORY_CARE_PROVIDER_SITE_OTHER): Payer: BC Managed Care – PPO | Admitting: Family Medicine

## 2010-04-15 VITALS — BP 168/104 | HR 122 | Wt 112.0 lb

## 2010-04-15 DIAGNOSIS — IMO0001 Reserved for inherently not codable concepts without codable children: Secondary | ICD-10-CM

## 2010-04-15 DIAGNOSIS — L309 Dermatitis, unspecified: Secondary | ICD-10-CM

## 2010-04-15 DIAGNOSIS — N390 Urinary tract infection, site not specified: Secondary | ICD-10-CM

## 2010-04-15 DIAGNOSIS — L280 Lichen simplex chronicus: Secondary | ICD-10-CM | POA: Insufficient documentation

## 2010-04-15 DIAGNOSIS — L259 Unspecified contact dermatitis, unspecified cause: Secondary | ICD-10-CM

## 2010-04-15 DIAGNOSIS — R030 Elevated blood-pressure reading, without diagnosis of hypertension: Secondary | ICD-10-CM

## 2010-04-15 LAB — POCT URINALYSIS DIPSTICK
Blood, UA: NEGATIVE
Leukocytes, UA: NEGATIVE
Protein, UA: NEGATIVE
Urobilinogen, UA: 0.2
pH, UA: 7.5

## 2010-04-15 MED ORDER — TRIAMCINOLONE ACETONIDE 0.5 % EX CREA: TOPICAL_CREAM | Freq: Three times a day (TID) | CUTANEOUS | Status: AC

## 2010-04-15 MED ORDER — METHYLPREDNISOLONE (PAK) 4 MG PO TABS: 4.0000 mg | ORAL_TABLET | Freq: Every day | ORAL | Status: DC

## 2010-04-15 NOTE — Patient Instructions (Signed)
Elevated blood pressure, to return for nurse visit in 1 week for BP Take  Medrol dosepak for rash, also apply cream to rash on back and leg 3 times daily thinly but thoroughly Refill medications  When needed

## 2010-04-15 NOTE — Telephone Encounter (Signed)
Please schedule pt for 3:30

## 2010-04-15 NOTE — Telephone Encounter (Signed)
Pt has a rash on her back that is painful and REALLY wants to see Dr. Joni Fears today.

## 2010-04-15 NOTE — Progress Notes (Signed)
  Subjective:    Patient ID: Cassandra Alexander, female    DOB: 18-Mar-1945, 65 y.o.   MRN: [id number]  HPIPt  Had onset rash , 2 lesions on left lateral thigh, nonpruritic then  today hd erythematous, nonvesicular rash over rt side of back, almost follow s dermatomes, slightly pruitic Relates she  Had suspecte lesion r brast under nipple, to repeat ultrasound i 6 months Also BP elevated today 168/104, repeated 160/100 Has some tenderness over left lower abdomen with episodes of diarrhea,alternate with constipation No bleeding Notes estradiol has been very helpful regarding libido, vaginal dryness, general well being    Review of Systems  Constitutional: Positive for fatigue.  Eyes: Negative.   Respiratory: Negative.   Cardiovascular: Negative.        Blood pressure elevaed withouy previous hpertension  Gastrointestinal:       Nx diverticulitis,has some tenderness left lower abdomen No masses  Genitourinary: Negative.   Musculoskeletal: Negative.   Neurological: Positive for headaches.  Hematological: Negative.   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  HENT:  Head: Normocephalic.  Cardiovascular: Normal rate, regular rhythm and normal heart sounds.  Exam reveals no gallop and no friction rub.   No murmur heard. Pulmonary/Chest: Right breast exhibits tenderness. Right breast exhibits no inverted nipple, no mass, no nipple discharge and no skin change. Left breast exhibits no inverted nipple, no mass, no nipple discharge, no skin change and no tenderness. Breasts are symmetrical.    Abdominal: There is tenderness.       Tenderness over descending colon, slightly incrased bowel sounds  Musculoskeletal:       Rt knee replaced, tender left knee  Skin: Rash noted.       Erythematous  neurodermatitis over back, 2 nonspecific rash left thigh          Assessment & Plan:  Neurodematitis, to tx with medrol dospk and triamcinilone cream 0.5% tid Anxiety ans stress to be treated with alprazolone . 0.25 am an midafternoon and 1.0 mg hs Irritable bowel take align once per day Elevated Blood pressure return 1 week for nurse recheck, low salt diet

## 2010-04-16 ENCOUNTER — Other Ambulatory Visit: Payer: Self-pay | Admitting: Family Medicine

## 2010-04-16 DIAGNOSIS — R52 Pain, unspecified: Secondary | ICD-10-CM

## 2010-04-21 ENCOUNTER — Ambulatory Visit (INDEPENDENT_AMBULATORY_CARE_PROVIDER_SITE_OTHER): Payer: BC Managed Care – PPO | Admitting: Family Medicine

## 2010-04-21 ENCOUNTER — Encounter: Payer: Self-pay | Admitting: Family Medicine

## 2010-04-21 VITALS — BP 120/92 | HR 66 | Temp 98.1°F

## 2010-04-21 DIAGNOSIS — F32A Depression, unspecified: Secondary | ICD-10-CM

## 2010-04-21 DIAGNOSIS — F419 Anxiety disorder, unspecified: Secondary | ICD-10-CM

## 2010-04-21 DIAGNOSIS — F329 Major depressive disorder, single episode, unspecified: Secondary | ICD-10-CM

## 2010-04-21 DIAGNOSIS — F341 Dysthymic disorder: Secondary | ICD-10-CM

## 2010-04-21 MED ORDER — CITALOPRAM HYDROBROMIDE 10 MG PO TABS: 10.0000 mg | ORAL_TABLET | Freq: Every day | ORAL | Status: AC

## 2010-04-22 NOTE — Progress Notes (Signed)
  Subjective:    Patient ID: Cassandra Alexander, female    DOB: 20-Jul-1945, 65 y.o.   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 I think this has some RA and her hypertension She continues to have pain of both knees, even the knee that was replaced We discussed the fact that medication might help in controlling her emotions as well as her alcoholic intake We spent 59-93 minutes discussing all the aspects of her life in the past and will we can expect in the future with her improvement in her relationship with her husband    Review of Systemssee history of present illness no new complaints     Objective:   Physical Exam  Constitutional: She is oriented to person, place, and time. She appears well-developed and well-nourished. No distress.  Cardiovascular: Normal rate, regular rhythm, normal heart sounds and intact distal pulses.  Exam reveals no gallop and no friction rub.   No murmur heard. Pulmonary/Chest: Effort normal and breath sounds normal. No respiratory distress. She has no wheezes. She has no rales. She exhibits no tenderness.  Musculoskeletal:       Tenderness both knees bilaterally  Neurological: She is alert and oriented to person, place, and time.  Skin: She is not diaphoretic.  Psychiatric:       Patient i  appears stressedand depressed          Assessment & Plan:  My impression is that her blood pressure is labile but recommend that she continue to take her blood pressure at home on a regular basis and notify me of the results if the pressure runs consistently above 140 the plan is that she decreases her alcoholic intake. I am to treat her with citalopramand that she starts attending [place] meetings and she is to return to see me in 6 weeks Continue her other medications as prescribed

## 2010-04-22 NOTE — Patient Instructions (Signed)
Continue to take your blood pressure on a regular basis which is usually in the evening. Start taking citalopram to help relieve some of the stress and depression and help with the intake of alcohol Start going  To  AA meetings SND as you can arrange to do so Return in 4-6 weeks

## 2010-05-19 ENCOUNTER — Ambulatory Visit (INDEPENDENT_AMBULATORY_CARE_PROVIDER_SITE_OTHER): Payer: BC Managed Care – PPO | Admitting: Family Medicine

## 2010-05-19 ENCOUNTER — Encounter: Payer: Self-pay | Admitting: Family Medicine

## 2010-05-19 VITALS — BP 112/82 | HR 89 | Temp 98.9°F | Wt 111.0 lb

## 2010-05-19 DIAGNOSIS — IMO0001 Reserved for inherently not codable concepts without codable children: Secondary | ICD-10-CM

## 2010-05-19 DIAGNOSIS — F419 Anxiety disorder, unspecified: Secondary | ICD-10-CM

## 2010-05-19 DIAGNOSIS — F341 Dysthymic disorder: Secondary | ICD-10-CM

## 2010-05-19 DIAGNOSIS — R030 Elevated blood-pressure reading, without diagnosis of hypertension: Secondary | ICD-10-CM

## 2010-05-19 DIAGNOSIS — F329 Major depressive disorder, single episode, unspecified: Secondary | ICD-10-CM

## 2010-05-21 ENCOUNTER — Other Ambulatory Visit: Payer: Self-pay | Admitting: Family Medicine

## 2010-05-27 NOTE — Patient Instructions (Signed)
Very pleased your blood pressure is normal and you do not need a hypertensive treatment Continue your regular medications as I have instructed you

## 2010-05-27 NOTE — Progress Notes (Signed)
  Subjective:    Patient ID: Cassandra Alexander, female    DOB: 1946/02/10, 65 y.o.   MRN: [id number] This 65 year old married female has had elevated blood pressure but is now normal and has been normal her readings at home. Today we have spent some time discussing alcohol problems or alcohol intake and some of the warnings of increased alcoholic intake and also some of the problems associated with this. HPI    Review of Systemsnegative review of system otherwise     Objective:   Physical Exampatient appears somewhat anxiousblood pressure 112/82 heart and lungs clear no edema      Assessment & Plan:  Patient does not have hypertension continue her medication for anxiety depression and continue medications for  Irritable bowel,Continue the alprazolam as stated above and be very careful about your alcoholic intake

## 2010-06-01 ENCOUNTER — Encounter: Payer: Self-pay | Admitting: Family Medicine

## 2010-06-02 ENCOUNTER — Other Ambulatory Visit: Payer: Self-pay | Admitting: Family Medicine

## 2010-06-03 MED ORDER — ALPRAZOLAM 1 MG PO TABS: 1.0000 mg | ORAL_TABLET | Freq: Three times a day (TID) | ORAL | Status: DC | PRN

## 2010-06-08 ENCOUNTER — Other Ambulatory Visit: Payer: Self-pay | Admitting: Family Medicine

## 2010-06-09 ENCOUNTER — Other Ambulatory Visit (INDEPENDENT_AMBULATORY_CARE_PROVIDER_SITE_OTHER): Payer: BC Managed Care – PPO | Admitting: Family Medicine

## 2010-06-09 DIAGNOSIS — Z0000 Encounter for general adult medical examination without abnormal findings: Secondary | ICD-10-CM

## 2010-06-09 DIAGNOSIS — E785 Hyperlipidemia, unspecified: Secondary | ICD-10-CM

## 2010-06-09 LAB — LIPID PANEL
Cholesterol: 211 mg/dL — ABNORMAL HIGH (ref 0–200)
HDL: 109.3 mg/dL (ref >39.00)
Total CHOL/HDL Ratio: 2
VLDL: 12.2 mg/dL (ref 0.0–40.0)

## 2010-06-09 LAB — BASIC METABOLIC PANEL
BUN: 14 mg/dL (ref 6–23)
Calcium: 9.1 mg/dL (ref 8.4–10.5)
GFR: 123.71 mL/min (ref >60.00)
Glucose, Bld: 101 mg/dL — ABNORMAL HIGH (ref 70–99)

## 2010-06-09 LAB — HEPATIC FUNCTION PANEL
AST: 21 U/L (ref 0–37)
Total Bilirubin: 0.4 mg/dL (ref 0.3–1.2)

## 2010-06-09 LAB — CBC WITH DIFFERENTIAL/PLATELET
Basophils Absolute: 0 10*3/uL (ref 0.0–0.1)
Eosinophils Absolute: 0.1 10*3/uL (ref 0.0–0.7)
HCT: 38.3 % (ref 36.0–46.0)
Lymphocytes Relative: 40 % (ref 12.0–46.0)
Lymphs Abs: 1.6 10*3/uL (ref 0.7–4.0)
Monocytes Relative: 9 % (ref 3.0–12.0)
Platelets: 239 10*3/uL (ref 150.0–400.0)
RDW: 13.1 % (ref 11.5–14.6)

## 2010-06-09 LAB — POCT URINALYSIS DIPSTICK
Blood, UA: NEGATIVE
Spec Grav, UA: 1.02
Urobilinogen, UA: 0.2
pH, UA: 7

## 2010-06-09 LAB — LDL CHOLESTEROL, DIRECT: Direct LDL: 83.4 mg/dL

## 2010-06-16 ENCOUNTER — Encounter: Payer: Self-pay | Admitting: Family Medicine

## 2010-06-16 ENCOUNTER — Ambulatory Visit (INDEPENDENT_AMBULATORY_CARE_PROVIDER_SITE_OTHER): Payer: BC Managed Care – PPO | Admitting: Family Medicine

## 2010-06-16 VITALS — BP 140/80 | HR 76 | Temp 98.2°F | Resp 16 | Ht 60.0 in | Wt 109.0 lb

## 2010-06-16 DIAGNOSIS — F32A Depression, unspecified: Secondary | ICD-10-CM

## 2010-06-16 DIAGNOSIS — F329 Major depressive disorder, single episode, unspecified: Secondary | ICD-10-CM

## 2010-06-16 DIAGNOSIS — Z0289 Encounter for other administrative examinations: Secondary | ICD-10-CM

## 2010-06-16 DIAGNOSIS — M1990 Unspecified osteoarthritis, unspecified site: Secondary | ICD-10-CM

## 2010-06-16 DIAGNOSIS — M129 Arthropathy, unspecified: Secondary | ICD-10-CM

## 2010-06-16 DIAGNOSIS — Z0282 Encounter for adoption services: Secondary | ICD-10-CM

## 2010-06-16 DIAGNOSIS — I671 Cerebral aneurysm, nonruptured: Secondary | ICD-10-CM

## 2010-06-16 DIAGNOSIS — Z780 Asymptomatic menopausal state: Secondary | ICD-10-CM

## 2010-06-16 DIAGNOSIS — F419 Anxiety disorder, unspecified: Secondary | ICD-10-CM

## 2010-06-16 DIAGNOSIS — F341 Dysthymic disorder: Secondary | ICD-10-CM

## 2010-06-16 DIAGNOSIS — N951 Menopausal and female climacteric states: Secondary | ICD-10-CM

## 2010-06-16 NOTE — Patient Instructions (Signed)
You are doing fine except for the arthritis in back and knees, try diclofenac again. Get Align to help IBS For the bruising get Vit C tabs 500 mg AM and PM Lab results are good Will refill medications

## 2010-06-23 ENCOUNTER — Other Ambulatory Visit: Payer: Self-pay | Admitting: Family Medicine

## 2010-07-05 NOTE — Progress Notes (Signed)
  Subjective:    Patient ID: Cassandra Alexander, female    DOB: November 22, 1945, 65 y.o.   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   HENT: Negative.   Eyes: Negative.   Cardiovascular: Negative.   Gastrointestinal: Positive for constipation.       Past past history of girdle bowel syndrome controlled with Levbid  Genitourinary: Negative.   Musculoskeletal: Positive for back pain and arthralgias.       History of knee replacement  Skin: Negative.   Neurological: Negative.   Hematological: Negative.   Psychiatric/Behavioral: Negative.        Objective:   Physical Exam the patient is a well well well-nourished white female who appears in no distress and very pleasant however appears somewhat depressed HEENT negative carotid pulses good thyroid is nonpalpable Lungs clear to palpation percussion and auscultation no rales are heard no dullness  Heart no cardiomegaly heart sounds are good without murmur for pulseless and good bilaterally Breast no masses palpable no tenderness nipples normal axilla clear Abdomen liver spleen and kidneys are nonpalpable no tenderness slightly increased bowel sounds Pelvic introitus small clitoris normal absent uterus no masses no tenderness Rectal examination negative Scar left knee from knee replacement, both knees are tender medially and laterally pain on flexion Neurological examination negative Skin negative Back examination  negative no point tenderness no SI tenderness          Assessment & Plan:  Preventative physical examination reveals a healthy female Arthritis knees and back to try diclofenac 75 mg b.i.d. If not relieved take hydrocodone Anxiety depression to be treated with Celexa and alprazolam

## 2010-07-28 ENCOUNTER — Other Ambulatory Visit: Payer: Self-pay

## 2010-07-28 MED ORDER — ESTRADIOL 1 MG PO TABS: 1.0000 mg | ORAL_TABLET | Freq: Every day | ORAL | Status: DC

## 2010-07-28 NOTE — Telephone Encounter (Signed)
Pt walked into the office requesting estradiol 10m; rx sent to [facility] on [address]

## 2010-07-31 NOTE — Discharge Summary (Signed)
NAMESAIDI, SANTACROCE                 ACCOUNT NO.:  [id number]   MEDICAL RECORD NO.:  [id number]          PATIENT TYPE:  INP   LOCATION:  3106                         FACILITY:  [facility]   PHYSICIAN:  Sanjeev K. Deveshwar, M.D.DATE OF BIRTH:  [birth year]   DATE OF ADMISSION:  01/21/2005  DATE OF DISCHARGE:  01/22/2005                                 DISCHARGE SUMMARY   CHIEF COMPLAINT:  Cerebral aneurysm.   HISTORY OF PRESENT ILLNESS:  This is a very pleasant, 65 year old female  referred to Dr. Estanislado Pandy from Drs. Joni Fears and [place].  The patient recently  had an MRI as well as an MRA due to memory problems.  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  The patient was  referred to Dr. Estanislado Pandy for possible treatment.  Dr. Estanislado Pandy saw the  patient in consultation on 12/23/2004.  Treatment options were discussed as  well as risks and benefits.  The patient made a decision to proceed with  coiling of the aneurysm.  She was admitted to [facility] on  01/21/2005 for that procedure.   PAST MEDICAL HISTORY:  Significant for anxiety and depression.  She has DJD.  Apparently she has been having a lot of problems with right knee pain.   PAST SURGICAL HISTORY:  Significant for a tonsillectomy.  She has a history  of a fractured ankle. She has had arthroscopic surgery of her knee.  She has  had two cesarean sections.  She has had surgical repair of a rectal  prolapse.   ALLERGIES:  PENICILLIN and MOBIC.   MEDICATIONS AT THE TIME OF ADMISSION:  1.  Included imipramine 150 mg daily.  2.  Diclofenac 75 mg b.i.d.  3.  Xanax 1 mg t.i.d. p.r.n.  4.  Hydrocodone q.4 h. p.r.n.  5.  Crestor daily.   SOCIAL HISTORY:  The patient is married. She has a son and a daughter.  She  lives in [place] with her husband.  She quit smoking 12 years ago.  She  has one or two beers per evening.  She works as a Publishing copy.  She has a very  stressful job.   FAMILY  HISTORY:  The patient's mother is alive at age 63; she has obesity  and hypertension.  Her father is alive at age 22.  He has arthritis.   HOSPITAL COURSE:  As noted, this patient was admitted to [facility]  on 01/21/2005 for coiling of a 6-to-7-mm, intracranial, basilar,  communicating, artery aneurysm.  The patient underwent coiling on the day of  admission performed by Dr. Estanislado Pandy under general anesthesia.  She  tolerated this well.  The patient was placed on IV heparin and admitted to  the neurointensive care unit.  Apparently she had some bleeding from her  groin following the procedure.  Sheath had to be replaced with a larger size  sheath in order to control the bleeding.  Afterwards the patient did fine,  except she had very severe right knee pain.  The patient had previously had  an MRI of  the right knee performed on January 16, 2005.  Dr. Estanislado Pandy  reviewed this study.  The official reading showed full thickness cartilage  loss in the medial tibial plateau as well as mid femoral condyle with  marginal spur formation and subchondral edema, as well as some erosion.  There was also a partially ruptured Baker's cyst.  Dr. Estanislado Pandy recommended  further followup with her primary care physician and possibly an orthopedic  surgeon for further treatment of her knee pain.  She did receive IV Demerol  in the hospital for her pain, as well as Vicodin.   On 01/21/2005 the right groin sheath was removed.  Hemostasis was obtained.  The patient remained on bed rest for 6 hours.  The plan, at this time is for  her to ambulate after her bedrest is completed.  If she is feeling able, and  she appears to be medically stable, the plan is to proceed with discharge.  The patient is significantly improved at this time.   LABORATORY DATA:  A PTT on 10th was 51, an INR was 0.9.  A CBC on the 10th  revealed a hemoglobin of 11.8, hematocrit 34.5, WBC is 7400, platelets  254,000.  Chemistries  on the 10th revealed BUN 6, creatinine 0.6, potassium  3.6, glucose 114.  A CBC on admission revealed a hemoglobin of 14.8,  hematocrit 43.3.  WBC 7500 and platelets 323,000.  Chemistry profile on  admission was normal except for a glucose of 110.   DISCHARGE MEDICATIONS:  The patient was told to resume her home medications.  She is to stay on Plavix 75 mg daily for at least 2 weeks.  She is to  continue on aspirin 325 mg daily indefinitely.  The patient was told not to  drive or do anything strenuous for 2 weeks.  She is to have a repeat  angiogram in 3 months.  She will followup with Dr. Estanislado Pandy at 2 p.m.  Friday, November 24.  She is to followup with Dr. Joni Fears her primary care  physician for routine medical problems as well as for further  recommendations regarding her knee pain.   PROBLEM LIST AT THE TIME OF DISCHARGE:  1.  Status post coiling of a 6.7 mm intracranial, posterior communicating      artery aneurysm with placement of a stent performed by Dr. Estanislado Pandy on      01/21/2005 under general anesthesia.  2.  History of anxiety and depression.  3.  History of memory problems, dizziness, and presyncope.  4.  History of multiple surgeries.  5.  Remote tobacco history.  6.  Mild anemia following her procedure.      Mikey Bussing, P.A.    ______________________________  Fritz Pickerel. Estanislado Pandy, M.D.    DR/MEDQ  D:  01/22/2005  T:  01/22/2005  Job:  [id number]   cc:   Shanna Cisco., M.D.  [address] [place]  [place] [address]   Elizabeth Sauer, M.D.  Fax: [phone]

## 2010-07-31 NOTE — Consult Note (Signed)
Cassandra Alexander, LANDSBERG                 ACCOUNT NO.:  [id number]   MEDICAL RECORD NO.:  [id number]          PATIENT TYPE:  OUT   LOCATION:  XRAY                         FACILITY:  [facility]   PHYSICIAN:  Sanjeev K. Deveshwar, M.D.DATE OF BIRTH:  July 12, 1945   DATE OF CONSULTATION:  12/23/2004  DATE OF DISCHARGE:                                   CONSULTATION   HISTORY OF PRESENT ILLNESS:  This is a very pleasant 65 year old female who  was referred to Dr. Estanislado Pandy from Dr. Joni Fears.  The patient recently had  an MRI as well as an MRA which was initially performed due to ongoing memory  problems.  The MRA showed a 6 x 7 mm intracranial posterior communicating  artery aneurysm.  Arrangements were made to have the patient evaluated by  Dr. Estanislado Pandy as well as by Elizabeth Sauer, M.D.   PAST MEDICAL HISTORY:  Significant for anxiety and depression.  She has some  degenerative joint disease, but otherwise has been fairly healthy.   PAST SURGICAL HISTORY:  The patient is status post tonsillectomy.  She has a  history of fractured ankle.  She has had arthroscopic surgery of her knee.  She has had two cesarean sections.  She has had surgical repair of a rectal  prolapse.   ALLERGIES:  PENICILLIN, MOBIC.   CURRENT MEDICATIONS:  1.  Wellbutrin XL 150 mg every other day.  2.  Tramadol 50 mg daily.  3.  Diclofenac 75 mg b.i.d.  4.  Imipramine 150 mg at bedtime as well as Xanax p.r.n.   SOCIAL HISTORY:  The patient is married.  She has a son and daughter.  She  lives in [place] with her husband.  She quit smoking 12 years ago.  She  has one to two beers per night.  She works as a Cabin crew.  She has a very  stressful job.   FAMILY HISTORY:  Her mother is a live at age 14.  She has obesity and  hypertension.  Her father is alive at age 15.  He has arthritis.   REVIEW OF SYSTEMS:  Essentially negative except for ongoing knee pain,  occasional dizziness and presyncope.   IMPRESSION:  1.  6-7 mm  intracranial posterior communicating artery aneurysm by MRA.  2.  History of anxiety and depression.  3.  Reported memory problems, dizziness, and presyncope.  4.  History of multiple surgeries.  5.  Remote tobacco history.   PLAN:  The patient met with Dr. Estanislado Pandy today.  They had a long talk  regarding the patient's aneurysm, the possible treatment options, as well as  risks and benefits.  Dr. Estanislado Pandy did review the patient's MRA and showed  her the location of the aneurysm.  Treatment options included continued  monitoring with yearly MRA's versus endovascular coiling, versus open  surgery.  The risks and benefits of all three options were discussed.  The  patient stated that she would like to proceed with coiling.  She is due to  see Dr. Carloyn Manner tomorrow.  We will be in touch with the patient within  the next  day or so to give her possible dates for endovascular coiling of the  aneurysm if she is firm in her decision to proceed.  She can be reached by  her cell phone, the number is [phone].   Greater than 40 minutes was spent on this consult today.      Mikey Bussing, P.A.    ______________________________  Fritz Pickerel. Estanislado Pandy, M.D.    DR/MEDQ  D:  12/23/2004  T:  12/23/2004  Job:  [id number]   cc:   Shanna Cisco., M.D.  [address] [place]  [place] [address]   Elizabeth Sauer, M.D.  Fax: [phone]

## 2010-07-31 NOTE — Op Note (Signed)
NAMEKHALESSI, BLOUGH                 ACCOUNT NO.:  [id number]   MEDICAL RECORD NO.:  [id number]           PATIENT TYPE:   LOCATION:                                 FACILITY:   PHYSICIAN:  Gaynelle Arabian, M.D.         DATE OF BIRTH:   DATE OF PROCEDURE:  06/07/2005  DATE OF DISCHARGE:                                 OPERATIVE REPORT   PREOPERATIVE DIAGNOSIS:  Osteoarthritis right knee.   POSTOPERATIVE DIAGNOSIS:  Osteoarthritis right knee.   PROCEDURE:  Right total knee arthroplasty.   SURGEON:  Gaynelle Arabian, M.D.   ASSISTANT:  Alexzandrew L. Dara Lords, P.A.   ANESTHESIA:  General with postop Marcaine pain pump.   ESTIMATED BLOOD LOSS:  Minimal.   DRAIN:  Hemovac x1   TOURNIQUET TIME:  34 minutes at 300 mmHg.   COMPLICATIONS:  None.   CONDITION:  Stable to recovery.   BRIEF CLINICAL NOTE:  Cassandra Alexander is a 65 year old female who has developed end-  stage osteoarthritis of the right knee.  She has failed nonoperative  management including injections.  She presents now for total knee  arthroplasty.   PROCEDURE IN DETAIL:  After successful administration of general anesthetic  a tourniquet is placed high in her right thigh and her right lower extremity  prepped and draped in the usual sterile fashion.  Extremity is wrapped in  Esmarch, knee flexed, tourniquet inflated to 300 mmHg.  Midline incision is  made with a 10-blade through subcutaneous tissue to the level of the  extensor mechanism.  Fresh blade is used to make a medial parapatellar  arthrotomy.  Then the soft tissue of the proximal medial tibia  subperiosteally elevated through the joint line with the knife and into the  semimembranosus bursa with the Cobb elevator.   Soft tissue laterally is elevated with attention being paid to avoid the  patellar tendon on tibial tubercle.  Patella subluxed laterally, knee flexed  90 degrees, ACL and PCL removed.  Drill was used to create a starting hole  in the distal femur and the  canal was thoroughly irrigated.  A 5-degree  right valgus alignment guide was placed; and referencing off the posterior  condyles rotation is marked on the block and it is removed 10 mm up the  distal femur.  Distal femoral resection is made an oscillating saw.  Sizing  blocks placed in a size 2 is most appropriate.  Size 2 cutting block is  placed and rotation is marked at the epicondylar axis.  The anterior,  posterior, and chamfer cuts were made.   Tibia subluxed forward and the menisci removed.  Extramedullary tibial  alignment guide is placed referencing proximally at the medial aspect of the  tibial tubercle and distally along the second metatarsal axis and tibial  crest.  The block is pinned to remove 10 mm of the nondeficient lateral  side.  Tibial resection is made with an oscillating saw.  Size 2 is the most  appropriate tibial component and the proximal tibia is prepared with the  modular drill and keel punch  for a size 2.  Femoral preparation is completed  the intercondylar cut.   Size 2 mobile bearing tibial trial with a size 2 posterior stabilized  femoral trial and a 10-mm, posterior stabilized, rotating platform insert  trial were placed.  With the 10 she fully extends, but has a tiny bit of  varus valgus placement with the 12.5 which still allows her full extension  with excellent stability throughout full range of motion.  Patella was then  everted and thickness measured to be 20 mm.  Freehand resection is taken to  11 mm, 35 template is placed, lug holes are drilled, trial patella is placed  and it tracks normally.  There were no osteophytes to remove off the  posterior femur.  All trials removed, and the cut bone surfaces are prepared  with pulsatile lavage.  They were cemented next with implantation in a size  2 mobile bearing tibial tray, size 2 posterior stabilized femur, and 35  patella are cemented into place; and the patella is held with a clamp.  A  trial 12/5  insert is placed and the knee held in full extension, and all  extruded cement removed.  Once the cement is fully hardened, then a  permanent 12.5-mm, posterior stabilized, rotating platform insert is placed  into the tibial tray.  Wound is copiously irrigated with saline solution;  and the extensor mechanism closed over Hemovac drain with interrupted #1  PDS.  Flexion against gravity is 135 degrees.  Tourniquet is released at a  total time of 34 minutes.  Minor bleeding stopped with cautery.  Subcu is  closed with interrupted 2-0 Vicryl and subcuticular running 4-0 Monocryl.  The catheter for Marcaine pain pump is placed; and the pump is initiated.  Steri-Strips and bulky sterile dressing are applied; and the drain is hooked  to suction.  She is placed into a knee immobilizer, awakened, and  transported to recovery in stable condition.      Gaynelle Arabian, M.D.  Electronically Signed     FA/MEDQ  D:  06/07/2005  T:  06/09/2005  Job:  [id number]

## 2010-07-31 NOTE — Discharge Summary (Signed)
Cassandra Alexander, Cassandra Alexander                 ACCOUNT NO.:  [id number]   MEDICAL RECORD NO.:  [id number]          PATIENT TYPE:  INP   LOCATION:  1509                         FACILITY:  [facility]   PHYSICIAN:  Gaynelle Arabian, M.D.    DATE OF BIRTH:  1945-06-02   DATE OF ADMISSION:  06/07/2005  DATE OF DISCHARGE:  06/11/2005                                 DISCHARGE SUMMARY   ADMISSION DIAGNOSES:  1.  Osteoarthritis, right knee.  2.  History of stress headaches.  3.  Depression.  4.  Hypercholesterolemia.  5.  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  6.  Postmenopausal.   DISCHARGE DIAGNOSES:  1.  Osteoarthritis, right knee status post right total knee arthroplasty.  2.  Postoperative blood loss anemia, did not require transfusion.  3.  Postoperative hyponatremia improved.  4.  History of stress headaches.  5.  Depression.  6.  Hypercholesterolemia.  7.  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   PROCEDURES:  On June 07, 2005, right total knee surgery by Dr. Wynelle Link and  assistant Dara Lords, PA-C.   ANESTHESIA:  General. .  Postoperative Marcaine pain pump.   TOURNIQUET TIME:  34 minutes.   CONSULTATIONS:  None.   BRIEF HISTORY:  Cassandra Alexander is a 65 year old female who developed end-stage  arthritis of the right knee, who failed nonoperative management, including  injections, and now presents for total knee arthroplasty.   LABORATORY DATA:  Preop hemoglobin 12.0, hematocrit 36.7, white cell count  4.9.  Serial H&H's are followed.  Hemoglobin dropped down to 9.7 to 8.4,  last noted at 8.2 with a hematocrit of 24.3.  PT/PTT 12.3 and 29,  respectively on admission with INR 0.9.  Serial pro times as follows:  PT/INR 20/7.2 and 2.5.  Chemistries on admission:  All within normal limits  except for elevated AST and ALT of 43 and 60, respectively.  B-mets are as  follows:  Sodium dropped from 139 to 129 back up to 139.  Glucose went up  from  84 to 190 back down to 116.  Preop UA:  Amber color, small bilirubin,  trace ketones, otherwise, negative.  Blood type A positive.  EKG June 02, 2005, normal sinus rhythm when compared.  No significant change since last  tracing of January 20, 2005, per Dr. Angelena Sole.   HOSPITAL COURSE:  Admitted to [facility], tolerated procedure well  and was later transferred to recovery room.  When got to floor, had severe  amounts of pain postoperatively.  Was started on PCA with analgesics.  Day  #1, still had a fair amount of pain.  OxyContin was added.  Hemovac drain  was pulled, increased to high dose PCA.  Had a little bit better pain  control once the OxyContin was added.  She was doing a little bit better on  day #2.  Slowly progressing with physical therapy.  Dressing was changed.  Incision looked good.  Started to make progress with therapy, and she  actually got up and walked about 65  feet by day #2, after a slow start on  day #1.  She was weaned of her p.m. medications by the afternoon of day #2.  PCA was discontinued and IV's were heparin blocked.  By day #3, she had had  a rough night after day #2 because of the amount of activity and the way she  progressed with therapy.  She was still having a fair amount of pain.  Had  no bowel movements, and therefore, suppository was used.  Her hemoglobin had  dropped down a little bit further, down to 8.4.  She was placed on  Trinsicon, but she had no symptoms of anemia at that point.  Incision looked  good.  I rechecked her H&H the following day.  Hemoglobin essentially  stabilized at less than 8.2.  She had been up ambulating 110 feet and  getting up and down stairs.  She was seen on rounds at June 11, 2005, and  was discharged home.   DISCHARGE PLAN:  1.  Discharge home on June 11, 2005.  2.  For discharge diagnoses please see above.   DISCHARGE MEDICATIONS:  1.  Coumadin for DVT prophylaxis.  2.  OxyContin 10 mg q.12 h.   3.  Percocet p.r.n. pain.  4.  Trinsicon t.i.d. x3 weeks.  5.  Robaxin p.r.n. spasms.   DISCHARGE INSTRUCTIONS:  1.  Diet:  Low cholesterol diet.  2.  Activity:  Weightbearing as tolerated.  Total knee protocol.  3.  Home Health PT.  Home Health nursing.  4.  Knee immobilizer at all times.  5.  May start showering.   FOLLOWUP:  She is to follow up in the office in about two weeks.  Contact  the office at [phone].  Call for appointment.   DISPOSITION:  Home.   CONDITION ON DISCHARGE:  Improved.      Alexzandrew L. Dara Lords, P.A.      Gaynelle Arabian, M.D.  Electronically Signed    ALP/MEDQ  D:  06/11/2005  T:  06/12/2005  Job:  [id number]   cc:   Shanna Cisco., M.D.  [address] [place]  [place] [address]

## 2010-07-31 NOTE — H&P (Signed)
NAMEGEORGENA, Cassandra Alexander NO.:  [id number]   MEDICAL RECORD NO.:  [id number]          PATIENT TYPE:  INP   LOCATION:  1509                         FACILITY:  [facility]   PHYSICIAN:  Gaynelle Arabian, M.D.    DATE OF BIRTH:  [birth year]   DATE OF ADMISSION:  06/07/2005  DATE OF DISCHARGE:                                HISTORY & PHYSICAL   PRIMARY CARE PHYSICIAN:  Frankey Shown, M.D.   DATE OF ADMISSION:  Date of office visit and history and physical June 01, 2005, date of admission June 07, 2005.   CHIEF COMPLAINT:  Right knee pain.   HISTORY OF PRESENT ILLNESS:  The patient is a 65 year old female who has  been seen by Dr. Pilar Plate Aluisio for ongoing right knee pain.  She is known to  have significant arthritic changes.  She has been treated conservatively in  the past along with injections, however, she has recently undergone a series  of Synvisc, which did not help or provide much benefit.  Due to the  significant pain, it is felt she would benefit from undergoing knee  replacement.  The risks and benefits have been discussed.  She has elected  to proceed with surgery.  She does have a significant history of brain  aneurysm.  She had an angiogram about 2 weeks ago and said that everything  looks good and there has been no changes. She has had a previous coiling  procedure for the brain aneurysm last year.  The risks and benefits were  discussed and the patient is subsequently admitted to the hospital.   ALLERGIES:  PENICILLIN, childhood reaction, she does not remember.   CURRENT MEDICATIONS:  1.  Lipitor 40 mg daily.  2.  Imipramine 150 mg at night.  3.  Alprazolam 1 mg at night.  4.  Hydrocodone p.r.n. pain.  5.  Previous aspirin therapy but is not currently taking that now.   PAST MEDICAL HISTORY:  1.  Stress headaches.  2.  Depression.  3.  Hypercholesterolemia.  4.  History of brain aneurysm.  5.  Osteoarthritis.  6.  Postmenopausal.   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 Repair of prolapsed rectum and a  coiling procedure for a brain aneurysm in 2006.   SOCIAL HISTORY:  Married, works as a Cabin crew.  Nonsmoker.  Sometimes one to  two alcoholic drinks per day.  Two children.  Husband will be assisting with  care after surgery.   FAMILY HISTORY:  Father 13 with history of arthritis.  Mother with history  of hypertension and also bilateral knee replacements.  Uncle prostate  cancer, deceased in his 12's.   REVIEW OF SYSTEMS:  GENERAL:  No fevers, chills, night sweats.  NEUROLOGICAL:  No seizures, syncope or paralysis. RESPIRATORY:  No  productive cough, hemoptysis or shortness of breath.  CARDIOVASCULAR:  No  chest pain, angina or orthopnea. GASTROINTESTINAL:  No nausea, vomiting,  diarrhea, constipation, no blood or mucous in the stool.  GENITOURINARY:  No  dysuria,  hematuria, discharge.  MUSCULOSKELETAL:  Right knee as above.   PHYSICAL EXAMINATION:  VITAL SIGNS:  Pulse 76, respirations 12, blood  pressure 138/76.  GENERAL APPEARANCE:  65 year old white female, petite frame, well-nourished,  well-developed, in no acute distress.  Mildly anxious.  Good historian.  HEENT:  Normocephalic, atraumatic. Pupils equal, round, reactive. Oropharynx  clear. Extraocular movements intact. Previous surgical scar on her nose.  NECK:  Supple.  CHEST:  Clear anterior and posterior chest wall.  No rhonchi, rales or  wheezing.  HEART:  Regular rate and rhythm. No murmurs.  S1 and S2 noted.  ABDOMEN:  Soft, nontender, bowel sounds present.  RECTAL, BREASTS, GENITALIA:  Not done, not pertinent to present illness.  EXTREMITIES:  Right knee:  The right knee shows a slight varus malalignment  deformity, range of motion of 5 to 115 degrees, marked crepitus is noted, no  effusion.   IMPRESSION:  1.  Osteoarthritis right knee.  2.  History of stress headaches.  3.   Depression.  4.  Hypercholesterolemia.  5.  History of brain aneurysm, status post coiling procedure 2006.  6.  Postmenopausal.   PLAN:  Patient will be admitted to [facility] to undergo a right  total knee arthroplasty.  Surgery will be performed by Dr. Gaynelle Arabian.      Alexzandrew L. Dara Lords, P.A.      Gaynelle Arabian, M.D.  Electronically Signed    ALP/MEDQ  D:  06/06/2005  T:  06/08/2005  Job:  [id number]   cc:   Shanna Cisco., M.D.  [address] [place]  [place] [address]

## 2010-10-01 ENCOUNTER — Other Ambulatory Visit: Payer: Self-pay

## 2010-10-01 NOTE — Telephone Encounter (Signed)
rx sent in on 07/28/2010 30x6 rf

## 2010-10-20 ENCOUNTER — Other Ambulatory Visit: Payer: Self-pay | Admitting: Family Medicine

## 2010-10-21 ENCOUNTER — Other Ambulatory Visit: Payer: Self-pay

## 2010-10-21 MED ORDER — HYDROCODONE-ACETAMINOPHEN 10-500 MG PO TABS: 1.0000 | ORAL_TABLET | ORAL | Status: AC | PRN

## 2010-10-21 NOTE — Telephone Encounter (Signed)
rx sent to pharmacy

## 2010-10-27 ENCOUNTER — Ambulatory Visit: Payer: BC Managed Care – PPO | Admitting: Family Medicine

## 2010-10-28 ENCOUNTER — Ambulatory Visit (INDEPENDENT_AMBULATORY_CARE_PROVIDER_SITE_OTHER): Payer: BC Managed Care – PPO | Admitting: Family Medicine

## 2010-10-28 ENCOUNTER — Encounter: Payer: Self-pay | Admitting: Family Medicine

## 2010-10-28 VITALS — BP 130/84 | HR 80 | Temp 98.7°F | Wt 109.0 lb

## 2010-10-28 DIAGNOSIS — M129 Arthropathy, unspecified: Secondary | ICD-10-CM

## 2010-10-28 DIAGNOSIS — M1990 Unspecified osteoarthritis, unspecified site: Secondary | ICD-10-CM

## 2010-10-28 DIAGNOSIS — N952 Postmenopausal atrophic vaginitis: Secondary | ICD-10-CM

## 2010-10-28 DIAGNOSIS — F419 Anxiety disorder, unspecified: Secondary | ICD-10-CM

## 2010-10-28 DIAGNOSIS — F341 Dysthymic disorder: Secondary | ICD-10-CM

## 2010-10-28 DIAGNOSIS — G8929 Other chronic pain: Secondary | ICD-10-CM

## 2010-10-28 DIAGNOSIS — K589 Irritable bowel syndrome without diarrhea: Secondary | ICD-10-CM

## 2010-10-28 DIAGNOSIS — J029 Acute pharyngitis, unspecified: Secondary | ICD-10-CM

## 2010-10-28 DIAGNOSIS — F329 Major depressive disorder, single episode, unspecified: Secondary | ICD-10-CM

## 2010-10-28 MED ORDER — METHYLPREDNISOLONE ACETATE 80 MG/ML IJ SUSP
120.0000 mg | Freq: Once | INTRAMUSCULAR | Status: AC
  Administered 2010-10-28: 120 mg via INTRAMUSCULAR

## 2010-10-28 NOTE — Progress Notes (Signed)
  Subjective:    Patient ID: Cassandra Alexander, female    DOB: January 21, 1946, 65 y.o.   MRN: [id number] This 65 year old married female is in today complaining of sore throat and anterior lymphadenopathy all over the past 2 weeks and now improved and also complains of arthritic pain especially persisting pain in the right sacroiliac joint as well as pain in the right knee which had been replaced and is a chronic problem with pain. 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 2 months No other complaints on systems review  HPI    Review of Systems see history of present illness     Objective:   Physical Exam patient is a well developed well-nourished white female in no distress, pleasant and cooperative HEENT reveals ears to be negative minimal nasal congestion pharynx minimally erythematous all anterior lymph nodes  on the right Lungs clear to palpation percussion and auscultation Are of cardiomegaly heart sounds good without murmurs regular rhythm Abdomen liver spleen kidneys are nonpalpable no masses no tenderness normal bowel sounds Spine marked tenderness over the right sacroiliac joint        Assessment & Plan:  Acute pharyngitis -i anabiotic needed Chronic arthritic pain especially right knee also sacroiliac inflammation Depo-Medrol 120 mg IM plus continue hydrocodone acetaminophen Postmenopausal syndrome continue Estrace 1 mg daily Anxiety and depression continue alprazolam plus tofranil IBS better controlled use lomotil as needed

## 2010-10-28 NOTE — Patient Instructions (Signed)
Pharyngitis is improved, no antibiotic needed For your arthritic pain have given you depomedrol 120 mg IM Pleased you have control the line intake. I think you can continue the nicotine gum but if you would like to leave all 45 days and see if your throat or mouth feels better

## 2010-12-24 ENCOUNTER — Other Ambulatory Visit: Payer: Self-pay | Admitting: Family Medicine

## 2010-12-28 ENCOUNTER — Other Ambulatory Visit: Payer: Self-pay | Admitting: Family Medicine

## 2010-12-29 NOTE — Telephone Encounter (Signed)
Refill alprazolam to [facility]--see below. She had 2 refills on it, but it has expired. Thanks.

## 2011-01-14 ENCOUNTER — Telehealth: Payer: Self-pay | Admitting: *Deleted

## 2011-01-14 NOTE — Telephone Encounter (Signed)
Pt wants to know when her last colonoscopy was performed, and where.  She wants to call and reschedule a repeat.

## 2011-01-15 NOTE — Telephone Encounter (Signed)
Called and spoke with pt and gave her information on her last colonoscopy ([facility]).  Made pt aware that effective 01/14/11 Dr. Joni Fears has retired and pt can be seen for an acute need at this office until she decides which pcp she was would like to re-establish with. Pt states she is having abdominal pain and diarrhea and has a family history of colon cancer.  Offered pt an acute appt but she states she cannot come in on Monday.  Pt states she will call the endoscopy center and call our office if an ov is needed.

## 2011-05-05 ENCOUNTER — Other Ambulatory Visit: Payer: Self-pay | Admitting: Family Medicine

## 2011-05-07 NOTE — Telephone Encounter (Signed)
I don't see this on her current med list

## 2011-05-07 NOTE — Telephone Encounter (Signed)
Pt has not established with new pcp.  Pls advise.

## 2011-07-31 ENCOUNTER — Other Ambulatory Visit: Payer: Self-pay | Admitting: Family Medicine

## 2012-10-27 ENCOUNTER — Encounter: Payer: Self-pay | Admitting: Internal Medicine

## 2012-10-27 ENCOUNTER — Ambulatory Visit (INDEPENDENT_AMBULATORY_CARE_PROVIDER_SITE_OTHER): Payer: Medicare PPO | Admitting: Internal Medicine

## 2012-10-27 VITALS — BP 158/90 | HR 74 | Temp 97.2°F | Resp 18

## 2012-10-27 DIAGNOSIS — F329 Major depressive disorder, single episode, unspecified: Secondary | ICD-10-CM

## 2012-10-27 DIAGNOSIS — R079 Chest pain, unspecified: Secondary | ICD-10-CM

## 2012-10-27 DIAGNOSIS — E785 Hyperlipidemia, unspecified: Secondary | ICD-10-CM

## 2012-10-27 DIAGNOSIS — F411 Generalized anxiety disorder: Secondary | ICD-10-CM

## 2012-10-27 DIAGNOSIS — F3289 Other specified depressive episodes: Secondary | ICD-10-CM
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
## 2013-08-23 ENCOUNTER — Other Ambulatory Visit: Payer: Self-pay | Admitting: Family Medicine

## 2013-08-23 DIAGNOSIS — R1310 Dysphagia, unspecified: Secondary | ICD-10-CM

## 2013-08-28 ENCOUNTER — Ambulatory Visit
Admission: RE | Admit: 2013-08-28 | Discharge: 2013-08-28 | Disposition: A | Discharge status: Home / Self Care | Payer: Medicare FFS | Source: Ambulatory Visit | Attending: Family Medicine | Admitting: Family Medicine

## 2013-08-28 DIAGNOSIS — R1310 Dysphagia, unspecified: Secondary | ICD-10-CM

## 2014-03-18 ENCOUNTER — Other Ambulatory Visit (HOSPITAL_COMMUNITY): Payer: Self-pay | Admitting: Interventional Radiology

## 2014-03-18 DIAGNOSIS — I671 Cerebral aneurysm, nonruptured: Secondary | ICD-10-CM

## 2014-03-25 ENCOUNTER — Ambulatory Visit (HOSPITAL_COMMUNITY)
Admission: RE | Admit: 2014-03-25 | Discharge: 2014-03-25 | Disposition: A | Discharge status: Home / Self Care | Payer: Medicare FFS | Source: Ambulatory Visit | Attending: Interventional Radiology | Admitting: Interventional Radiology

## 2014-03-25 DIAGNOSIS — I671 Cerebral aneurysm, nonruptured: Secondary | ICD-10-CM

## 2015-02-13 ENCOUNTER — Telehealth (HOSPITAL_COMMUNITY): Payer: Self-pay

## 2015-02-13 NOTE — Telephone Encounter (Signed)
Called pt to schedule f/u MRI, pt stated that she was in too much pain from her back and wanted to wait until she felt better. She said that she would give [place] a call when she was ready to do MRI. AW

## 2015-02-19 ENCOUNTER — Other Ambulatory Visit (HOSPITAL_COMMUNITY): Payer: Self-pay | Admitting: Neurosurgery

## 2015-03-04 ENCOUNTER — Encounter (HOSPITAL_COMMUNITY)
Admission: RE | Admit: 2015-03-04 | Discharge: 2015-03-04 | Disposition: A | Discharge status: Home / Self Care | Payer: Medicare PPO | Source: Ambulatory Visit | Attending: Neurosurgery | Admitting: Neurosurgery

## 2015-03-04 ENCOUNTER — Encounter (HOSPITAL_COMMUNITY): Payer: Self-pay

## 2015-03-04 DIAGNOSIS — Z0183 Encounter for blood typing: Secondary | ICD-10-CM | POA: Diagnosis not present

## 2015-03-04 DIAGNOSIS — Z01812 Encounter for preprocedural laboratory examination: Secondary | ICD-10-CM | POA: Diagnosis present

## 2015-03-04 DIAGNOSIS — M4310 Spondylolisthesis, site unspecified: Secondary | ICD-10-CM | POA: Diagnosis not present

## 2015-03-04 HISTORY — DX: Diverticulosis of large intestine without perforation or abscess without bleeding: K57.30

## 2015-03-04 HISTORY — DX: Irritable bowel syndrome, unspecified: K58.9

## 2015-03-04 LAB — CBC WITH DIFFERENTIAL/PLATELET
BASOS ABS: 0 10*3/uL (ref 0.0–0.1)
Basophils Relative: 0 %
Eosinophils Absolute: 0 10*3/uL (ref 0.0–0.7)
Eosinophils Relative: 0 %
HEMATOCRIT: 40.2 % (ref 36.0–46.0)
HEMOGLOBIN: 13 g/dL (ref 12.0–15.0)
LYMPHS PCT: 40 %
Lymphs Abs: 1.6 10*3/uL (ref 0.7–4.0)
MCH: 30 pg (ref 26.0–34.0)
MCHC: 32.3 g/dL (ref 30.0–36.0)
MCV: 92.8 fL (ref 78.0–100.0)
MONO ABS: 0.4 10*3/uL (ref 0.1–1.0)
MONOS PCT: 9 %
NEUTROS ABS: 2 10*3/uL (ref 1.7–7.7)
Neutrophils Relative %: 51 %
Platelets: 219 10*3/uL (ref 150–400)
RBC: 4.33 MIL/uL (ref 3.87–5.11)
RDW: 13.8 % (ref 11.5–15.5)
WBC: 4 10*3/uL (ref 4.0–10.5)

## 2015-03-04 LAB — ABO/RH: ABO/RH(D): A POS

## 2015-03-04 LAB — TYPE AND SCREEN
ABO/RH(D): A POS
Antibody Screen: NEGATIVE

## 2015-03-04 LAB — SURGICAL PCR SCREEN
MRSA, PCR: NEGATIVE
Staphylococcus aureus: NEGATIVE

## 2015-03-04 NOTE — Progress Notes (Signed)
PCP is L. Donnie Coffin  Patient denied having any acute cardiac or pulmonary issues

## 2015-03-04 NOTE — Pre-Procedure Instructions (Signed)
Cassandra Alexander  03/04/2015     Your procedure is scheduled on : Friday March 14, 2015 at 3:00 PM.  Report to [facility] at 1:00 PM.  Call this number if you have problems the morning of surgery: [phone]    Remember:  Do not eat food or drink liquids after midnight.  Take these medicines the morning of surgery with A SIP OF WATER : Alprazolam (Xanax) if needed, Hydrocodone if needed   Stop taking any vitamins, herbal medications, Ibuprofen, Advil, Motrin, Aleve, etc on Friday December 23rd   Do not wear jewelry, make-up or nail polish.  Do not wear lotions, powders, or perfumes.    Do not shave 48 hours prior to surgery.    Do not bring valuables to the hospital.  [facility] is not responsible for any belongings or valuables.  Contacts, dentures or bridgework may not be worn into surgery.  Leave your suitcase in the car.  After surgery it may be brought to your room.  For patients admitted to the hospital, discharge time will be determined by your treatment team.  Patients discharged the day of surgery will not be allowed to drive home.   Name and phone number of your driver:    Special instructions:  Shower using CHG soap the night before and the morning of your surgery  Please read over the following fact sheets that you were given. Pain Booklet, Coughing and Deep Breathing, Blood Transfusion Information, MRSA Information and Surgical Site Infection Prevention

## 2015-03-13 MED ORDER — DEXAMETHASONE SODIUM PHOSPHATE 10 MG/ML IJ SOLN
10.0000 mg | INTRAMUSCULAR | Status: AC
  Administered 2015-03-14: 10 mg via INTRAVENOUS
  Filled 2015-03-13: qty 1

## 2015-03-13 MED ORDER — VANCOMYCIN HCL IN DEXTROSE 1-5 GM/200ML-% IV SOLN
1000.0000 mg | INTRAVENOUS | Status: AC
  Administered 2015-03-14: 1000 mg via INTRAVENOUS
  Filled 2015-03-13: qty 200

## 2015-03-14 ENCOUNTER — Inpatient Hospital Stay (HOSPITAL_COMMUNITY): Payer: Medicare PPO | Admitting: Anesthesiology

## 2015-03-14 ENCOUNTER — Encounter (HOSPITAL_COMMUNITY)
Admission: RE | Disposition: A | Discharge status: Home / Self Care | Payer: Medicare FFS | Source: Ambulatory Visit | Attending: Neurosurgery

## 2015-03-14 ENCOUNTER — Inpatient Hospital Stay (HOSPITAL_COMMUNITY)
Admission: RE | Admit: 2015-03-14 | Discharge: 2015-03-17 | DRG: 460 | Disposition: A | Discharge status: Home / Self Care | Payer: Medicare PPO | Source: Ambulatory Visit | Attending: Neurosurgery | Admitting: Neurosurgery

## 2015-03-14 ENCOUNTER — Encounter (HOSPITAL_COMMUNITY): Payer: Self-pay | Admitting: General Practice

## 2015-03-14 ENCOUNTER — Inpatient Hospital Stay (HOSPITAL_COMMUNITY): Payer: Medicare PPO

## 2015-03-14 DIAGNOSIS — Z888 Allergy status to other drugs, medicaments and biological substances status: Secondary | ICD-10-CM

## 2015-03-14 DIAGNOSIS — M4316 Spondylolisthesis, lumbar region: Secondary | ICD-10-CM | POA: Diagnosis not present

## 2015-03-14 DIAGNOSIS — Z419 Encounter for procedure for purposes other than remedying health state, unspecified: Secondary | ICD-10-CM

## 2015-03-14 DIAGNOSIS — F329 Major depressive disorder, single episode, unspecified: Secondary | ICD-10-CM | POA: Diagnosis present

## 2015-03-14 DIAGNOSIS — Z880 Allergy status to penicillin: Secondary | ICD-10-CM | POA: Diagnosis not present

## 2015-03-14 DIAGNOSIS — E785 Hyperlipidemia, unspecified: Secondary | ICD-10-CM | POA: Diagnosis present

## 2015-03-14 DIAGNOSIS — R509 Fever, unspecified: Secondary | ICD-10-CM | POA: Diagnosis not present

## 2015-03-14 DIAGNOSIS — Z96651 Presence of right artificial knee joint: Secondary | ICD-10-CM | POA: Diagnosis present

## 2015-03-14 DIAGNOSIS — F419 Anxiety disorder, unspecified: Secondary | ICD-10-CM | POA: Diagnosis not present

## 2015-03-14 DIAGNOSIS — Z87891 Personal history of nicotine dependence: Secondary | ICD-10-CM

## 2015-03-14 DIAGNOSIS — K589 Irritable bowel syndrome without diarrhea: Secondary | ICD-10-CM | POA: Diagnosis present

## 2015-03-14 DIAGNOSIS — Z79899 Other long term (current) drug therapy: Secondary | ICD-10-CM

## 2015-03-14 DIAGNOSIS — M549 Dorsalgia, unspecified: Secondary | ICD-10-CM | POA: Diagnosis present

## 2015-03-14 SURGERY — POSTERIOR LUMBAR FUSION 1 LEVEL
Anesthesia: General | Site: Back

## 2015-03-14 MED ORDER — HYDROMORPHONE HCL 1 MG/ML IJ SOLN
INTRAMUSCULAR | Status: AC
  Filled 2015-03-14: qty 1

## 2015-03-14 MED ORDER — LACTATED RINGERS IV SOLN
INTRAVENOUS | Status: DC
  Administered 2015-03-14 (×3): via INTRAVENOUS

## 2015-03-14 MED ORDER — NEOSTIGMINE METHYLSULFATE 10 MG/10ML IV SOLN
INTRAVENOUS | Status: DC | PRN
  Administered 2015-03-14: 4 mg via INTRAVENOUS

## 2015-03-14 MED ORDER — STERILE WATER FOR INJECTION IJ SOLN
INTRAMUSCULAR | Status: AC
  Filled 2015-03-14: qty 20

## 2015-03-14 MED ORDER — ASPIRIN EC 81 MG PO TBEC
81.0000 mg | DELAYED_RELEASE_TABLET | Freq: Every day | ORAL | Status: DC
Start: 2015-03-14 — End: 2015-03-17
  Administered 2015-03-14 – 2015-03-17 (×4): 81 mg via ORAL
  Filled 2015-03-14 (×4): qty 1

## 2015-03-14 MED ORDER — THROMBIN 20000 UNITS EX SOLR
CUTANEOUS | Status: DC | PRN
  Administered 2015-03-14: 16:00:00 via TOPICAL

## 2015-03-14 MED ORDER — PROPOFOL 10 MG/ML IV BOLUS
INTRAVENOUS | Status: AC
  Filled 2015-03-14: qty 20

## 2015-03-14 MED ORDER — PHENYLEPHRINE HCL 10 MG/ML IJ SOLN
INTRAMUSCULAR | Status: AC
  Filled 2015-03-14: qty 2

## 2015-03-14 MED ORDER — GLYCOPYRROLATE 0.2 MG/ML IJ SOLN
INTRAMUSCULAR | Status: AC
  Filled 2015-03-14: qty 8

## 2015-03-14 MED ORDER — DIAZEPAM 5 MG PO TABS
ORAL_TABLET | ORAL | Status: AC
  Filled 2015-03-14: qty 1

## 2015-03-14 MED ORDER — SODIUM CHLORIDE 0.9 % IR SOLN
Status: DC | PRN
  Administered 2015-03-14: 15:00:00

## 2015-03-14 MED ORDER — LIDOCAINE HCL (CARDIAC) 20 MG/ML IV SOLN
INTRAVENOUS | Status: DC | PRN
  Administered 2015-03-14: 80 mg via INTRAVENOUS

## 2015-03-14 MED ORDER — HYDROMORPHONE HCL 1 MG/ML IJ SOLN
0.5000 mg | INTRAMUSCULAR | Status: DC | PRN
  Administered 2015-03-14 – 2015-03-15 (×2): 1 mg via INTRAVENOUS
  Filled 2015-03-14 (×2): qty 1

## 2015-03-14 MED ORDER — ACETAMINOPHEN 325 MG PO TABS
650.0000 mg | ORAL_TABLET | ORAL | Status: DC | PRN
  Administered 2015-03-16: 650 mg via ORAL
  Filled 2015-03-14: qty 2

## 2015-03-14 MED ORDER — ROCURONIUM BROMIDE 100 MG/10ML IV SOLN
INTRAVENOUS | Status: DC | PRN
  Administered 2015-03-14: 15 mg via INTRAVENOUS
  Administered 2015-03-14: 25 mg via INTRAVENOUS

## 2015-03-14 MED ORDER — DIPHENHYDRAMINE HCL 50 MG/ML IJ SOLN
INTRAMUSCULAR | Status: DC | PRN
  Administered 2015-03-14: 12.5 mg via INTRAVENOUS

## 2015-03-14 MED ORDER — OXYCODONE-ACETAMINOPHEN 5-325 MG PO TABS
ORAL_TABLET | ORAL | Status: AC
  Filled 2015-03-14: qty 2

## 2015-03-14 MED ORDER — FENTANYL CITRATE (PF) 250 MCG/5ML IJ SOLN
INTRAMUSCULAR | Status: AC
  Filled 2015-03-14: qty 5

## 2015-03-14 MED ORDER — MIDAZOLAM HCL 5 MG/5ML IJ SOLN
INTRAMUSCULAR | Status: DC | PRN
  Administered 2015-03-14: 2 mg via INTRAVENOUS

## 2015-03-14 MED ORDER — MEPERIDINE HCL 25 MG/ML IJ SOLN: 6.2500 mg | INTRAMUSCULAR | Status: DC | PRN

## 2015-03-14 MED ORDER — HYDROMORPHONE HCL 1 MG/ML IJ SOLN
0.2500 mg | INTRAMUSCULAR | Status: DC | PRN
  Administered 2015-03-14 (×4): 0.5 mg via INTRAVENOUS

## 2015-03-14 MED ORDER — ESTRADIOL 1 MG PO TABS
0.5000 mg | ORAL_TABLET | ORAL | Status: DC
  Administered 2015-03-17: 0.5 mg via ORAL
  Filled 2015-03-14: qty 0.5

## 2015-03-14 MED ORDER — ROCURONIUM BROMIDE 50 MG/5ML IV SOLN
INTRAVENOUS | Status: AC
  Filled 2015-03-14: qty 1

## 2015-03-14 MED ORDER — OXYCODONE-ACETAMINOPHEN 5-325 MG PO TABS
1.0000 | ORAL_TABLET | ORAL | Status: DC | PRN
  Administered 2015-03-14 – 2015-03-17 (×9): 2 via ORAL
  Filled 2015-03-14 (×9): qty 2

## 2015-03-14 MED ORDER — BUPIVACAINE HCL (PF) 0.25 % IJ SOLN
INTRAMUSCULAR | Status: DC | PRN
  Administered 2015-03-14: 20 mL

## 2015-03-14 MED ORDER — PHENYLEPHRINE HCL 10 MG/ML IJ SOLN
INTRAMUSCULAR | Status: DC | PRN
  Administered 2015-03-14: 40 ug via INTRAVENOUS
  Administered 2015-03-14 (×2): 80 ug via INTRAVENOUS

## 2015-03-14 MED ORDER — PROPOFOL 10 MG/ML IV BOLUS
INTRAVENOUS | Status: DC | PRN
  Administered 2015-03-14: 160 mg via INTRAVENOUS

## 2015-03-14 MED ORDER — GLYCOPYRROLATE 0.2 MG/ML IJ SOLN
INTRAMUSCULAR | Status: DC | PRN
  Administered 2015-03-14: 0.6 mg via INTRAVENOUS

## 2015-03-14 MED ORDER — LIDOCAINE HCL (CARDIAC) 20 MG/ML IV SOLN
INTRAVENOUS | Status: AC
  Filled 2015-03-14: qty 5

## 2015-03-14 MED ORDER — SODIUM CHLORIDE 0.9 % IV SOLN
250.0000 mL | INTRAVENOUS | Status: DC
  Administered 2015-03-14: 250 mL via INTRAVENOUS

## 2015-03-14 MED ORDER — FENTANYL CITRATE (PF) 100 MCG/2ML IJ SOLN
INTRAMUSCULAR | Status: AC
  Administered 2015-03-14 (×2): 50 ug via INTRAVENOUS
  Administered 2015-03-14: 150 ug via INTRAVENOUS
  Administered 2015-03-14: 100 ug via INTRAVENOUS
  Filled 2015-03-14: qty 2

## 2015-03-14 MED ORDER — VANCOMYCIN HCL 1000 MG IV SOLR
INTRAVENOUS | Status: DC | PRN
Start: 2015-03-14 — End: 2015-03-14
  Administered 2015-03-14: 1000 mg via TOPICAL

## 2015-03-14 MED ORDER — MENTHOL 3 MG MT LOZG: 1.0000 | LOZENGE | OROMUCOSAL | Status: DC | PRN

## 2015-03-14 MED ORDER — VANCOMYCIN HCL IN DEXTROSE 750-5 MG/150ML-% IV SOLN
750.0000 mg | Freq: Once | INTRAVENOUS | Status: AC
  Administered 2015-03-15: 750 mg via INTRAVENOUS
  Filled 2015-03-14: qty 150

## 2015-03-14 MED ORDER — SUCCINYLCHOLINE CHLORIDE 20 MG/ML IJ SOLN
INTRAMUSCULAR | Status: AC
  Filled 2015-03-14: qty 1

## 2015-03-14 MED ORDER — METOCLOPRAMIDE HCL 5 MG/ML IJ SOLN
10.0000 mg | Freq: Once | INTRAMUSCULAR | Status: DC | PRN
Start: 2015-03-14 — End: 2015-03-14

## 2015-03-14 MED ORDER — ARTIFICIAL TEARS OP OINT
TOPICAL_OINTMENT | OPHTHALMIC | Status: DC | PRN
  Administered 2015-03-14: 1 via OPHTHALMIC

## 2015-03-14 MED ORDER — ONDANSETRON HCL 4 MG/2ML IJ SOLN: 4.0000 mg | INTRAMUSCULAR | Status: DC | PRN

## 2015-03-14 MED ORDER — 0.9 % SODIUM CHLORIDE (POUR BTL) OPTIME
TOPICAL | Status: DC | PRN
  Administered 2015-03-14: 1000 mL

## 2015-03-14 MED ORDER — HYDROCODONE-ACETAMINOPHEN 5-325 MG PO TABS
1.0000 | ORAL_TABLET | ORAL | Status: DC | PRN
  Administered 2015-03-16 – 2015-03-17 (×5): 2 via ORAL
  Filled 2015-03-14 (×5): qty 2

## 2015-03-14 MED ORDER — PHENYLEPHRINE 40 MCG/ML (10ML) SYRINGE FOR IV PUSH (FOR BLOOD PRESSURE SUPPORT)
PREFILLED_SYRINGE | INTRAVENOUS | Status: AC
  Filled 2015-03-14: qty 20

## 2015-03-14 MED ORDER — ONDANSETRON HCL 4 MG/2ML IJ SOLN
INTRAMUSCULAR | Status: DC | PRN
  Administered 2015-03-14: 4 mg via INTRAVENOUS

## 2015-03-14 MED ORDER — VANCOMYCIN HCL 1000 MG IV SOLR
INTRAVENOUS | Status: AC
  Filled 2015-03-14: qty 1000

## 2015-03-14 MED ORDER — SODIUM CHLORIDE 0.9 % IJ SOLN: 3.0000 mL | INTRAMUSCULAR | Status: DC | PRN

## 2015-03-14 MED ORDER — NEOSTIGMINE METHYLSULFATE 10 MG/10ML IV SOLN
INTRAVENOUS | Status: AC
  Filled 2015-03-14: qty 4

## 2015-03-14 MED ORDER — ARTIFICIAL TEARS OP OINT
TOPICAL_OINTMENT | OPHTHALMIC | Status: AC
  Filled 2015-03-14: qty 7

## 2015-03-14 MED ORDER — FENTANYL CITRATE (PF) 100 MCG/2ML IJ SOLN
50.0000 ug | Freq: Once | INTRAMUSCULAR | Status: AC
Start: 2015-03-14 — End: 2015-03-14
  Administered 2015-03-14: 50 ug via INTRAVENOUS
  Filled 2015-03-14: qty 1

## 2015-03-14 MED ORDER — BSS IO SOLN
15.0000 mL | Freq: Once | INTRAOCULAR | Status: AC
Start: 2015-03-14 — End: 2015-03-14
  Administered 2015-03-14: 15 mL
  Filled 2015-03-14: qty 15

## 2015-03-14 MED ORDER — SUCCINYLCHOLINE CHLORIDE 20 MG/ML IJ SOLN
INTRAMUSCULAR | Status: DC | PRN
  Administered 2015-03-14: 100 mg via INTRAVENOUS

## 2015-03-14 MED ORDER — ALPRAZOLAM 0.5 MG PO TABS
1.0000 mg | ORAL_TABLET | Freq: Three times a day (TID) | ORAL | Status: DC | PRN
  Administered 2015-03-14 – 2015-03-16 (×4): 1 mg via ORAL
  Filled 2015-03-14 (×5): qty 2

## 2015-03-14 MED ORDER — PHENOL 1.4 % MT LIQD
1.0000 | OROMUCOSAL | Status: DC | PRN
Start: 2015-03-14 — End: 2015-03-17

## 2015-03-14 MED ORDER — DIAZEPAM 5 MG PO TABS
5.0000 mg | ORAL_TABLET | Freq: Four times a day (QID) | ORAL | Status: DC | PRN
Start: 2015-03-14 — End: 2015-03-17
  Administered 2015-03-14: 5 mg via ORAL
  Administered 2015-03-15: 10 mg via ORAL
  Administered 2015-03-15 (×2): 5 mg via ORAL
  Filled 2015-03-14 (×2): qty 1
  Filled 2015-03-14: qty 2

## 2015-03-14 MED ORDER — KETOROLAC TROMETHAMINE 0.5 % OP SOLN
1.0000 [drp] | Freq: Three times a day (TID) | OPHTHALMIC | Status: AC | PRN
  Administered 2015-03-14: 1 [drp] via OPHTHALMIC
  Filled 2015-03-14: qty 3

## 2015-03-14 MED ORDER — SODIUM CHLORIDE 0.9 % IJ SOLN
3.0000 mL | Freq: Two times a day (BID) | INTRAMUSCULAR | Status: DC
  Administered 2015-03-14 – 2015-03-17 (×6): 3 mL via INTRAVENOUS

## 2015-03-14 MED ORDER — MIDAZOLAM HCL 2 MG/2ML IJ SOLN
INTRAMUSCULAR | Status: AC
  Filled 2015-03-14: qty 2

## 2015-03-14 MED ORDER — ACETAMINOPHEN 650 MG RE SUPP: 650.0000 mg | RECTAL | Status: DC | PRN

## 2015-03-14 MED ORDER — IMIPRAMINE HCL 50 MG PO TABS
150.0000 mg | ORAL_TABLET | Freq: Every day | ORAL | Status: DC
  Administered 2015-03-14 – 2015-03-16 (×3): 150 mg via ORAL
  Filled 2015-03-14 (×4): qty 3

## 2015-03-14 SURGICAL SUPPLY — 60 items
APL SKNCLS STERI-STRIP NONHPOA (GAUZE/BANDAGES/DRESSINGS) ×1
BAG DECANTER FOR FLEXI CONT (MISCELLANEOUS) ×2 IMPLANT
BENZOIN TINCTURE PRP APPL 2/3 (GAUZE/BANDAGES/DRESSINGS) ×2 IMPLANT
BLADE CLIPPER SURG (BLADE) IMPLANT
BRUSH SCRUB EZ PLAIN DRY (MISCELLANEOUS) ×2 IMPLANT
BUR CUTTER 7.0 ROUND (BURR) ×2 IMPLANT
BUR MATCHSTICK NEURO 3.0 LAGG (BURR) ×2 IMPLANT
CANISTER SUCT 3000ML PPV (MISCELLANEOUS) ×2 IMPLANT
CAP LCK SPNE (Orthopedic Implant) ×4 IMPLANT
CAP LOCK SPINE RADIUS (Orthopedic Implant) IMPLANT
CAP LOCKING (Orthopedic Implant) ×8 IMPLANT
CONT SPEC 4OZ CLIKSEAL STRL BL (MISCELLANEOUS) ×2 IMPLANT
COVER BACK TABLE 60X90IN (DRAPES) ×2 IMPLANT
DECANTER SPIKE VIAL GLASS SM (MISCELLANEOUS) ×2 IMPLANT
DEVICE INTERBODY ELEVATE 23X8 (Cage) ×4 IMPLANT
DRAPE C-ARM 42X72 X-RAY (DRAPES) ×5 IMPLANT
DRAPE LAPAROTOMY 100X72X124 (DRAPES) ×2 IMPLANT
DRAPE POUCH INSTRU U-SHP 10X18 (DRAPES) ×2 IMPLANT
DRAPE PROXIMA HALF (DRAPES) IMPLANT
DRAPE SURG 17X23 STRL (DRAPES) ×8 IMPLANT
DRSG OPSITE POSTOP 4X8 (GAUZE/BANDAGES/DRESSINGS) ×1 IMPLANT
DURAPREP 26ML APPLICATOR (WOUND CARE) ×2 IMPLANT
ELECT REM PT RETURN 9FT ADLT (ELECTROSURGICAL) ×2
ELECTRODE REM PT RTRN 9FT ADLT (ELECTROSURGICAL) ×1 IMPLANT
EVACUATOR 1/8 PVC DRAIN (DRAIN) ×1 IMPLANT
GAUZE SPONGE 4X4 12PLY STRL (GAUZE/BANDAGES/DRESSINGS) ×2 IMPLANT
GAUZE SPONGE 4X4 16PLY XRAY LF (GAUZE/BANDAGES/DRESSINGS) ×1 IMPLANT
GLOVE ECLIPSE 9.0 STRL (GLOVE) ×4 IMPLANT
GLOVE EXAM NITRILE LRG STRL (GLOVE) IMPLANT
GLOVE EXAM NITRILE MD LF STRL (GLOVE) IMPLANT
GLOVE EXAM NITRILE XL STR (GLOVE) IMPLANT
GLOVE EXAM NITRILE XS STR PU (GLOVE) IMPLANT
GOWN STRL REUS W/ TWL LRG LVL3 (GOWN DISPOSABLE) IMPLANT
GOWN STRL REUS W/ TWL XL LVL3 (GOWN DISPOSABLE) ×2 IMPLANT
GOWN STRL REUS W/TWL 2XL LVL3 (GOWN DISPOSABLE) IMPLANT
GOWN STRL REUS W/TWL LRG LVL3 (GOWN DISPOSABLE)
GOWN STRL REUS W/TWL XL LVL3 (GOWN DISPOSABLE) ×4
KIT BASIN OR (CUSTOM PROCEDURE TRAY) ×2 IMPLANT
KIT ROOM TURNOVER OR (KITS) ×2 IMPLANT
LIQUID BAND (GAUZE/BANDAGES/DRESSINGS) ×2 IMPLANT
MILL MEDIUM DISP (BLADE) ×1 IMPLANT
NEEDLE HYPO 22GX1.5 SAFETY (NEEDLE) ×2 IMPLANT
NS IRRIG 1000ML POUR BTL (IV SOLUTION) ×2 IMPLANT
PACK LAMINECTOMY NEURO (CUSTOM PROCEDURE TRAY) ×2 IMPLANT
ROD RADIUS 40MM (Neuro Prosthesis/Implant) ×4 IMPLANT
ROD SPNL 40X5.5XNS TI RDS (Neuro Prosthesis/Implant) IMPLANT
SCREW 5.75 X 635 (Screw) ×2 IMPLANT
SCREW 5.75X40M (Screw) ×2 IMPLANT
SPACER SPNL XLORDOTIC 23X8X (Cage) IMPLANT
SPCR SPNL XLORDOTIC 23X8X (Cage) ×2 IMPLANT
SPONGE SURGIFOAM ABS GEL 100 (HEMOSTASIS) ×2 IMPLANT
STRIP CLOSURE SKIN 1/2X4 (GAUZE/BANDAGES/DRESSINGS) ×3 IMPLANT
SUT VIC AB 0 CT1 18XCR BRD8 (SUTURE) ×2 IMPLANT
SUT VIC AB 0 CT1 8-18 (SUTURE) ×2
SUT VIC AB 2-0 CT1 18 (SUTURE) ×2 IMPLANT
SUT VIC AB 3-0 SH 8-18 (SUTURE) ×4 IMPLANT
TOWEL OR 17X24 6PK STRL BLUE (TOWEL DISPOSABLE) ×2 IMPLANT
TOWEL OR 17X26 10 PK STRL BLUE (TOWEL DISPOSABLE) ×2 IMPLANT
TRAY FOLEY W/METER SILVER 14FR (SET/KITS/TRAYS/PACK) ×2 IMPLANT
WATER STERILE IRR 1000ML POUR (IV SOLUTION) ×2 IMPLANT

## 2015-03-14 NOTE — H&P (Signed)
  SEPTEMBER MORMILE is an 69 y.o. female.   Chief Complaint: Back pain HPI: 69 year old female with progressive lumbar pain with radiation to both lower extremities failing conservative management. Workup demonstrates evidence of dynamic spondylolisthesis at L4-L5 with marked facet arthropathy and moderately severe stenosis. Patient presents now for lumbar decompression and fusion.  Past Medical History  Diagnosis Date  . Depression   . Osteoarthritis   . Hyperlipidemia   . Anxiety   . Diverticula of colon   . IBS (irritable bowel syndrome)     Past Surgical History  Procedure Laterality Date  . Tonsillectomy    . Replacement total knee Right   . Cesarean section      X 2  . Rectal prolapse repair    . Colonoscopy      History reviewed. No pertinent family history. Social History:  reports that she quit smoking about 25 years ago. She has never used smokeless tobacco. She reports that she drinks alcohol. She reports that she does not use illicit drugs.  Allergies:  Allergies  Allergen Reactions  . Cortisone Swelling    Caused facial swelling and headache after receiving cortisone injection  . Penicillins Other (See Comments)    Unspecified     Medications Prior to Admission  Medication Sig Dispense Refill  . ALPRAZolam (XANAX) 1 MG tablet TAKE 1 TABLET BY MOUTH THREE TIMES DAILY AS NEEDED 90 tablet 5  . aspirin 81 MG tablet Take 81 mg by mouth daily.      Marland Kitchen estradiol (ESTRACE) 0.5 MG tablet Take 1 tablet by mouth 2 (two) times a week.  11  . HYDROcodone-acetaminophen (NORCO) 10-325 MG tablet Take 1 tablet by mouth every 6 (six) hours as needed for moderate pain or severe pain.    Marland Kitchen imipramine (TOFRANIL) 50 MG tablet TAKE 3 TABLETS AT BEDTIME 180 tablet 6    No results found for this or any previous visit (from the past 48 hour(s)). No results found.  Pertinent items noted in HPI and remainder of comprehensive ROS otherwise negative.  Blood pressure 121/79, pulse 80,  temperature 97.8 F (36.6 C), temperature source Oral, resp. rate 16, height 5' (1.524 m), weight 53.071 kg (117 lb), SpO2 98 %.  The patient is awake and alert. She is oriented and appropriate. His cranial nerve function is intact. Her speech is fluent. Her mood judgment and insight are intact. Motor and sensory examination of her extremities are normal. Deep tendon reflexes aren't hypoactive in both lower extremities but symmetric. There is no evidence of long track signs. Gait is mildly antalgic. Posture is somewhat flexed. Examination head ears eyes and throat is marked. Chest and abdomen are benign. Extremities are free from injury or deformity. Assessment/Plan L4-5 degenerative unstable spondylolisthesis with stenosis. Plan bilateral L4-5 decompressive laminotomies and foraminotomies followed by posterior lumbar interbody fusion utilizing interbody expandable cages and local autograft coupled with posterior lateral arthrodesis utilizing nonsegmental pedicle screw instrumentation. Risks and benefits of been explained. Patient wishes to proceed.  Rebbeca Sheperd A 03/14/2015, 2:35 PM

## 2015-03-14 NOTE — Progress Notes (Signed)
Patient reports receiving cortisone injections in the past and with the last injection in July, she experienced facial swelling.  Patient has ordered 10 mg Decadron.  Dr. Annette Stable made aware of patient's allergy to cortisone and states that patient can still receive Decadron.

## 2015-03-14 NOTE — Anesthesia Postprocedure Evaluation (Signed)
Anesthesia Post Note  Patient: Cassandra Alexander  Procedure(s) Performed: Procedure(s) (LRB): Posterior Lumbar Interbody Fusion Lumbar Four- Lumbar Five (N/A)  Patient location during evaluation: PACU Anesthesia Type: General Level of consciousness: awake and alert and sedated Pain management: pain level controlled Vital Signs Assessment: post-procedure vital signs reviewed and stable Respiratory status: spontaneous breathing, nonlabored ventilation, respiratory function stable and patient connected to nasal cannula oxygen Cardiovascular status: blood pressure returned to baseline and stable Postop Assessment: no signs of nausea or vomiting Anesthetic complications: no    Last Vitals:  Filed Vitals:   03/14/15 1729 03/14/15 1730  BP:    Pulse: 64 64  Temp:    Resp: 10 10    Last Pain:  Filed Vitals:   03/14/15 1731  PainSc: 10-Worst pain ever                 Juliocesar Blasius,JAMES TERRILL

## 2015-03-14 NOTE — Progress Notes (Signed)
Pt arrived on unit

## 2015-03-14 NOTE — Op Note (Signed)
Date of procedure: 03/14/2015  Date of dictation: Same  Service: Neurosurgery  Preoperative diagnosis: Grade 1 L4-5 degenerative, unstable spondylolisthesis with stenosis and neurogenic claudication  Postoperative diagnosis: Same  Procedure Name: Bilateral L4-L5 decompressive laminotomies with bilateral L4 and L5 decompressive foraminotomies, more than would be required for simple interbody fusion alone.  L4-5 posterior lumbar interbody fusion utilizing interbody expandable cages and local autograft  L4-5 posterior lateral arthrodesis utilizing nonsegmental pedicle screw instrumentation and local autograft.  Surgeon:Trenice Mesa A.Blayne Frankie, M.D.  Asst. Surgeon: Arnoldo Morale  Anesthesia: General  Indication: 69 year old female with progressive back and bilateral lower extremity pain. Workup demonstrates evidence of an unstable degenerative spondylolisthesis at L4-5 with stenosis. The patient has failed conservative management and presents now for lumbar decompression and fusion in hopes of improving her symptoms.  Operative note: After induction of anesthesia, patient position prone onto Wilson frame and appropriately padded. Lumbar region prepped and draped sterilely. Incision made overlying L4-5. Dissection performed bilaterally. Retractor placed. Fluoroscopy used. Levels confirmed. Bilateral decompressive laminotomies and foraminotomies performed using high-speed drill and Kerrison rongeurs in excess of oh would be required for simple interbody fusion alone. Bilateral discectomies then performed at L4-5. Disc spaces prepared for interbody fusion. Disc space distracted to 8 mm. Cage inserted on the contralateral size using an 8 mm x 12 [employer] expandable cage packed with morselized autograft. Cage expanded full extent. Distractor removed from patient's contralateral side. Disc space prepared for interbody fusion on that side. Morselize autograft packed into the interspace. A second cage was then impacted  into place and expanded to its full extent. Pedicles of L4 and L5 were identified using surface landmarks and intraoperative fluoroscopy. Each pedicle was probed and tapped. Each tap hole was probed and found to be solidly within bone. 5.75 mm radius ran screws from [employer] were placed bilaterally. Residual facets and transverse processes were decorticated. Morselize autograft was packed posterior laterally. Short segment titanium rod placed over the screw heads. Locking caps placed or the screws. Locking caps engaged with the construct under compression. Final images revealed good position of the bone graft and hardware at the proper operative level with normal alignment of the spine. Wound was irrigated one final time. Hemostasis was achieved with the bipolar cautery. Gelfoam was placed topically over the laminotomy defects. Vancomycin powder was placed in the deep wound space. Wounds and close in layers with Vicryl sutures. Steri-Strips and sterile dressing were applied. No apparent complications. Patient tolerated the procedure well and she returns to the recovery room postop.

## 2015-03-14 NOTE — Anesthesia Preprocedure Evaluation (Addendum)
Anesthesia Evaluation  Patient identified by MRN, date of birth, ID band Patient awake    Reviewed: Allergy & Precautions, NPO status , Patient's Chart, lab work & pertinent test results  Airway Mallampati: II  TM Distance: >3 FB Neck ROM: Full    Dental  (+) Caps, Dental Advisory Given, Teeth Intact,    Pulmonary former smoker,    Pulmonary exam normal breath sounds clear to auscultation       Cardiovascular Normal cardiovascular exam Rhythm:Regular Rate:Normal     Neuro/Psych  Headaches, PSYCHIATRIC DISORDERS Anxiety Depression Hx/o ruptured cerebral aneurysm    GI/Hepatic Neg liver ROS, Hx/o Diverticulosis   Endo/Other  Hyperlipidemia  Renal/GU negative Renal ROS  negative genitourinary   Musculoskeletal  (+) Arthritis , Spondylolisthesis L4-L5   Abdominal   Peds  Hematology negative hematology ROS (+)   Anesthesia Other Findings   Reproductive/Obstetrics                           Anesthesia Physical Anesthesia Plan  ASA: III  Anesthesia Plan: General   Post-op Pain Management:    Induction: Intravenous  Airway Management Planned: Oral ETT  Additional Equipment:   Intra-op Plan:   Post-operative Plan: Extubation in OR  Informed Consent: I have reviewed the patients History and Physical, chart, labs and discussed the procedure including the risks, benefits and alternatives for the proposed anesthesia with the patient or authorized representative who has indicated his/her understanding and acceptance.   Dental advisory given  Plan Discussed with: Anesthesiologist, CRNA and Surgeon  Anesthesia Plan Comments:         Anesthesia Quick Evaluation

## 2015-03-14 NOTE — Transfer of Care (Signed)
Immediate Anesthesia Transfer of Care Note  Patient: Cassandra Alexander  Procedure(s) Performed: Procedure(s): Posterior Lumbar Interbody Fusion Lumbar Four- Lumbar Five (N/A)  Patient Location: PACU  Anesthesia Type:General  Level of Consciousness: awake  Airway & Oxygen Therapy: Patient Spontanous Breathing and Patient connected to face mask oxygen  Post-op Assessment: Report given to RN and Post -op Vital signs reviewed and stable  Post vital signs: Reviewed and stable  Last Vitals:  Filed Vitals:   03/14/15 1250  BP: 121/79  Pulse: 80  Temp: 36.6 C  Resp: 16    Complications: No apparent anesthesia complications

## 2015-03-14 NOTE — Brief Op Note (Signed)
03/14/2015  5:05 PM  PATIENT:  Cassandra Alexander  68 y.o. female  PRE-OPERATIVE DIAGNOSIS:  Spondylolisthesis  POST-OPERATIVE DIAGNOSIS:  spondylolisthesis  PROCEDURE:  Procedure(s): Posterior Lumbar Interbody Fusion Lumbar Four- Lumbar Five (N/A)  SURGEON:  Surgeon(s) and Role:    * Earnie Larsson, MD - Primary  PHYSICIAN ASSISTANT:   ASSISTANTS: Jenkins   ANESTHESIA:   general  EBL:  Total I/O In: 1500 [I.V.:1500] Out: 250 [Urine:100; Blood:150]  BLOOD ADMINISTERED:none  DRAINS: none   LOCAL MEDICATIONS USED:  MARCAINE     SPECIMEN:  No Specimen  DISPOSITION OF SPECIMEN:  N/A  COUNTS:  YES  TOURNIQUET:  * No tourniquets in log *  DICTATION: .Dragon Dictation  PLAN OF CARE: Admit to inpatient   PATIENT DISPOSITION:  PACU - hemodynamically stable.   Delay start of Pharmacological VTE agent (>24hrs) due to surgical blood loss or risk of bleeding: yes

## 2015-03-14 NOTE — Progress Notes (Signed)
ANTIBIOTIC CONSULT NOTE - INITIAL  Pharmacy Consult for vancomycin Indication: surgical prophylaxis  Allergies  Allergen Reactions  . Cortisone Swelling    Caused facial swelling and headache after receiving cortisone injection  . Penicillins Other (See Comments)    Unspecified     Patient Measurements: Height: 5' (152.4 cm) Weight: 117 lb (53.071 kg) IBW/kg (Calculated) : 45.5   Vital Signs: Temp: 97.7 F (36.5 C) (12/30 1846) Temp Source: Oral (12/30 1250) BP: 146/71 mmHg (12/30 1846) Pulse Rate: 82 (12/30 1846) Intake/Output from previous day:   Intake/Output from this shift:    Labs: No results for input(s): WBC, HGB, PLT, LABCREA, CREATININE in the last 72 hours. CrCl cannot be calculated (Patient has no serum creatinine result on file.). No results for input(s): VANCOTROUGH, VANCOPEAK, VANCORANDOM, GENTTROUGH, GENTPEAK, GENTRANDOM, TOBRATROUGH, TOBRAPEAK, TOBRARND, AMIKACINPEAK, AMIKACINTROU, AMIKACIN in the last 72 hours.   Microbiology: Recent Results (from the past 720 hour(s))  Surgical pcr screen     Status: None   Collection Time: 03/04/15  1:48 PM  Result Value Ref Range Status   MRSA, PCR NEGATIVE NEGATIVE Final   Staphylococcus aureus NEGATIVE NEGATIVE Final    Comment:        The Xpert SA Assay (FDA approved for NASAL specimens in patients over 68 years of age), is one component of a comprehensive surveillance program.  Test performance has been validated by [facility] for patients greater than or equal to 30 year old. It is not intended to diagnose infection nor to guide or monitor treatment.     Medical History: Past Medical History  Diagnosis Date  . Depression   . Osteoarthritis   . Hyperlipidemia   . Anxiety   . Diverticula of colon   . IBS (irritable bowel syndrome)      Assessment: 69 yo female s/p spinal surgery. Pharmacy consulted to dose vancomycin for surgical prophylaxis (no drain in place). Vancomycin 1031m given at  ~ 2:30pm.  -no known history of renal dysfunction (SCr= 0.5 in 2012)  Goal of Therapy:  Vancomycin trough level 10-15 mcg/ml  Plan:  -Vancomycin 7556mIV at 2:30am on 03/15/15 -Will sign off. Please contact pharmacy with any other needs  AnHildred LaserPhSherian Rein 03/14/2015 7:29 PM

## 2015-03-14 NOTE — Anesthesia Procedure Notes (Signed)
Procedure Name: Intubation Date/Time: 03/07/2015 2:43 PM Performed by: Lavell Luster Pre-anesthesia Checklist: Patient identified, Emergency Drugs available, Suction available, Patient being monitored and Timeout performed Patient Re-evaluated:Patient Re-evaluated prior to inductionOxygen Delivery Method: Circle system utilized Preoxygenation: Pre-oxygenation with 100% oxygen Intubation Type: IV induction Ventilation: Mask ventilation without difficulty Laryngoscope Size: 3 and Mac Grade View: Grade III Tube type: Oral Tube size: 7.5 mm Number of attempts: 1 Airway Equipment and Method: Stylet Placement Confirmation: ETT inserted through vocal cords under direct vision,  positive ETCO2 and breath sounds checked- equal and bilateral Secured at: 22 cm Tube secured with: Tape Dental Injury: Teeth and Oropharynx as per pre-operative assessment

## 2015-03-15 NOTE — Progress Notes (Signed)
Postop day 1. Patient doing very well. States that her back feels tired after being up with therapy. Preoperative lower extremity pain and severe back pain improved however. Patient does not feel ready for discharge home today however.  Afebrile. Vitals are stable. She is awake and alert. She is oriented and appropriate. Her motor and sensory function of her extremities are normal. Her wound is clean and dry. Her abdomen is soft. Chest is clear.  Overall doing well following L4-5 decompression and fusion. Continue efforts at mobilization. Possible discharge home tomorrow.

## 2015-03-15 NOTE — Progress Notes (Signed)
Foley cath D/c'd patient ambulated 168fet with brace on using walker. Tolerated activity well.

## 2015-03-15 NOTE — Evaluation (Signed)
Occupational Therapy Evaluation Patient Details Name: Cassandra Alexander MRN: [id number] DOB: [birth year] Today's Date: 03/15/2015    History of Present Illness 69 y.o. female post L4-L5 decompression and fusion due to spondylolisthesis, facet arthropathy and moderately severe stenosis on 03/14/15.    Clinical Impression   Pt admitted with above. Pt independent with ADLs, PTA. Feel pt will benefit from acute OT to increase independence and reinforce precautions with functional activity prior to d/c.     Follow Up Recommendations  No OT follow up;Supervision - Intermittent    Equipment Recommendations  3 in 1 bedside comode    Recommendations for Other Services       Precautions / Restrictions Precautions Precautions: Back Precaution Booklet Issued: No Precaution Comments: educated/reviewed back precautions Required Braces or Orthoses: Spinal Brace Spinal Brace: Lumbar corset;Applied in sitting position Restrictions Weight Bearing Restrictions: No      Mobility Bed Mobility Overal bed mobility: Needs Assistance Bed Mobility: Rolling;Sidelying to Sit;Sit to Sidelying Rolling: Supervision Sidelying to sit: Supervision     Sit to sidelying: Supervision General bed mobility comments: cues for technique  Transfers Overall transfer level: Needs assistance Equipment used: Rolling walker (2 wheeled) Transfers: Sit to/from Stand Sit to Stand: Min guard         General transfer comment: cue for hand placement    Balance        Used RW in session. Balance not formally assessed.                                    ADL Overall ADL's : Needs assistance/impaired     Grooming: Wash/dry face;Set up;Sitting;Supervision/safety           Upper Body Dressing : Set up;Supervision/safety;Sitting   Lower Body Dressing: Min guard;Sit to/from stand   Toilet Transfer: Min guard;Ambulation;RW (sit to stand from bed)           Functional mobility during  ADLs: Min guard;Rolling walker General ADL Comments: Discussed incorporating precautions into functional activities. Educated on LB ADL technique and AE. Discussed back brace.      Vision     Perception     Praxis      Pertinent Vitals/Pain Pain Assessment: 0-10 Pain Score:  (8-9) Pain Location: back Pain Intervention(s): Monitored during session;Repositioned (notified nurse)     Hand Dominance     Extremity/Trunk Assessment Upper Extremity Assessment Upper Extremity Assessment: Overall WFL for tasks assessed   Lower Extremity Assessment Lower Extremity Assessment: Defer to PT evaluation       Communication Communication Communication: No difficulties   Cognition Arousal/Alertness: Awake/alert Behavior During Therapy: WFL for tasks assessed/performed (seemed to be anxious) Overall Cognitive Status: Within Functional Limits for tasks assessed, however did pull up on RW despite cues not to do so.                    General Comments       Exercises       Shoulder Instructions      Home Living Family/patient expects to be discharged to:: Private residence Living Arrangements: Spouse/significant other;Children Available Help at Discharge: Family;Available PRN/intermittently Type of Home: House Home Access: Level entry     Home Layout: Two level Alternate Level Stairs-Number of Steps: 12 Alternate Level Stairs-Rails: Can reach both Bathroom Shower/Tub: Tub/shower unit         Home Equipment: Walker - 2 wheels  Prior Functioning/Environment Level of Independence: Independent             OT Diagnosis: Acute pain   OT Problem List: Decreased knowledge of use of DME or AE;Decreased knowledge of precautions;Pain;Decreased activity tolerance; Decreased safety awareness   OT Treatment/Interventions: Self-care/ADL training;DME and/or AE instruction;Therapeutic activities;Patient/family education;Balance training    OT Goals(Current  goals can be found in the care plan section) Acute Rehab OT Goals Patient Stated Goal: not stated OT Goal Formulation: With patient Time For Goal Achievement: 03/22/15 Potential to Achieve Goals: Good ADL Goals Pt Will Perform Lower Body Dressing: sit to/from stand;with set-up Pt Will Transfer to Toilet: ambulating;with set-up Pt Will Perform Toileting - Clothing Manipulation and hygiene: with set-up;sit to/from stand Pt Will Perform Tub/Shower Transfer: Tub transfer;with supervision;with set-up;ambulating;rolling walker;3 in 1 Additional ADL Goal #1: Pt will independently verbalize 3/3 precautions and maintain during session.  OT Frequency: Min 2X/week   Barriers to D/C:            Co-evaluation              End of Session Equipment Utilized During Treatment: Gait belt;Rolling walker;Back brace Nurse Communication: Other (comment) (seemed anxious; pain)  Activity Tolerance: Patient limited by pain Patient left: with bed alarm set;with call bell/phone within reach;in bed   Time: 6728-9791 OT Time Calculation (min): 20 min Charges:  OT General Charges $OT Visit: 1 Procedure OT Evaluation $Initial OT Evaluation Tier I: 1 Procedure G-CodesBenito Mccreedy OTR/L [id number] 03/15/2015, 3:21 PM

## 2015-03-15 NOTE — Progress Notes (Signed)
Physical Therapy Evaluation Patient Details Name: Cassandra Alexander MRN: [id number] DOB: 1946/03/07 Today's Date: 03/15/2015   History of Present Illness  69 yo female post L4-L5 decompression and fusion due to spondylolisthesis, facet arthropathy and moderately severe stenosis on 03/14/15.   Clinical Impression  Patient reports pain is now only in her low back, not down legs.  Demonstrates MIN Assist overall for bed mobility plus cues for log roll technique, Supervision assist for transfers and gait including stairs.  Patient and family education completed, will benefit from additional PT services at home, to transition care, will maintain on PT caseload while still in hospital, but is able to transition when medically appropriate.    Follow Up Recommendations Home health PT    Equipment Recommendations  3in1 (PT)    Recommendations for Other Services OT consult     Precautions / Restrictions Precautions Precautions: Back Precaution Booklet Issued: Yes (comment) Precaution Comments: Education provided to patient, spouse, and daughter Required Braces or Orthoses: Spinal Brace Spinal Brace: Lumbar corset;Applied in sitting position Restrictions Weight Bearing Restrictions: No      Mobility  Bed Mobility Overal bed mobility: Needs Assistance Bed Mobility: Rolling;Sidelying to Sit Rolling: Supervision (Cues for alignment) Sidelying to sit: Min assist       General bed mobility comments: Log roll technique, bed flat, no rail.  Transfers Overall transfer level: Needs assistance Equipment used: Rolling walker (2 wheeled) Transfers: Sit to/from Stand Sit to Stand: Supervision         General transfer comment: cues for hand placement  Ambulation/Gait Ambulation/Gait assistance: Supervision Ambulation Distance (Feet): 100 Feet Assistive device: Rolling walker (2 wheeled) Gait Pattern/deviations: Step-through pattern;Antalgic Gait velocity: Mildly decreased       Stairs Stairs: Yes Stairs assistance: Min guard Stair Management: Two rails Number of Stairs: 10 General stair comments: alternating steps  Wheelchair Mobility    Modified Rankin (Stroke Patients Only)       Balance Overall balance assessment: Modified Independent                                           Pertinent Vitals/Pain Pain Assessment: 0-10 Pain Score: 6  Pain Location: low back incision Pain Intervention(s): Monitored during session;Premedicated before session;Limited activity within patient's tolerance    Home Living Family/patient expects to be discharged to:: Private residence Living Arrangements: Spouse/significant other;Children Available Help at Discharge: Family;Available PRN/intermittently Type of Home: House Home Access: Level entry     Home Layout: Two level Home Equipment: Walker - 2 wheels      Prior Function Level of Independence: Independent               Hand Dominance        Extremity/Trunk Assessment   Upper Extremity Assessment: Overall WFL for tasks assessed;Defer to OT evaluation           Lower Extremity Assessment: Overall WFL for tasks assessed         Communication   Communication: No difficulties  Cognition Arousal/Alertness: Awake/alert Behavior During Therapy: WFL for tasks assessed/performed Overall Cognitive Status: Within Functional Limits for tasks assessed                      General Comments General comments (skin integrity, edema, etc.): Lumbar spine incision (covered)    Exercises General Exercises - Lower Extremity Heel Slides: AROM;Both;10 reps;Supine  Assessment/Plan    PT Assessment Patient needs continued PT services;All further PT needs can be met in the next venue of care  PT Diagnosis Difficulty walking;Acute pain   PT Problem List Decreased strength;Pain;Decreased activity tolerance;Decreased mobility;Decreased knowledge of precautions  PT  Treatment Interventions DME instruction;Gait training;Stair training;Functional mobility training;Therapeutic exercise;Therapeutic activities;Patient/family education   PT Goals (Current goals can be found in the Care Plan section) Acute Rehab PT Goals Patient Stated Goal: To not have pain anymore PT Goal Formulation: With patient Time For Goal Achievement: 03/29/15 Potential to Achieve Goals: Good    Frequency Min 5X/week   Barriers to discharge        Co-evaluation               End of Session Equipment Utilized During Treatment: Gait belt Activity Tolerance: Patient tolerated treatment well;Patient limited by pain Patient left: in bed;with call bell/phone within reach;with family/visitor present Nurse Communication: Mobility status         Time: 3338-3291 PT Time Calculation (min) (ACUTE ONLY): 40 min   Charges:   PT Evaluation $Initial PT Evaluation Tier I: 1 Procedure PT Treatments $Gait Training: 8-22 mins $Therapeutic Activity: 8-22 mins   PT G Codes:        Lecretia Buczek L 03/20/15, 11:12 AM

## 2015-03-16 NOTE — Progress Notes (Signed)
Patient with temp of 102.5. Prn tylenol given  Use of incentive spirometry encouraged. Provider made aware.

## 2015-03-16 NOTE — Progress Notes (Signed)
Subjective: Patient reports sore today  Objective: Vital signs in last 24 hours: Temp:  [98.2 F (36.8 C)-102.5 F (39.2 C)] 98.6 F (37 C) (01/01 1031) Pulse Rate:  [78-101] 81 (01/01 1031) Resp:  [16-18] 16 (01/01 1031) BP: (115-157)/(53-110) 115/61 mmHg (01/01 1031) SpO2:  [92 %-100 %] 92 % (01/01 1031)  Intake/Output from previous day: 12/31 0701 - 01/01 0700 In: 240 [P.O.:240] Out: -  Intake/Output this shift:    Physical Exam: Strength full.  Sitting up in bed.  Lab Results: No results for input(s): WBC, HGB, HCT, PLT in the last 72 hours. BMET No results for input(s): NA, K, CL, CO2, GLUCOSE, BUN, CREATININE, CALCIUM in the last 72 hours.  Studies/Results: Dg Lumbar Spine 2-3 Views  03/14/2015  CLINICAL DATA:  Anterior and posterior lumbar fusion. EXAM: DG C-ARM 61-120 MIN; LUMBAR SPINE - 2-3 VIEW COMPARISON:  02/19/2015 and MRI of 02/07/2015. FINDINGS: AP and lateral views. This demonstrates trans pedicle screw fixation at L4-5. Loss of intervertebral disc height at L5-S1. No acute hardware complication. IMPRESSION: Intraoperative imaging of L4-5 fixation. Electronically Signed   By: Abigail Miyamoto M.D.   On: 03/14/2015 16:53   Dg C-arm 1-60 Min  03/14/2015  CLINICAL DATA:  Anterior and posterior lumbar fusion. EXAM: DG C-ARM 61-120 MIN; LUMBAR SPINE - 2-3 VIEW COMPARISON:  02/19/2015 and MRI of 02/07/2015. FINDINGS: AP and lateral views. This demonstrates trans pedicle screw fixation at L4-5. Loss of intervertebral disc height at L5-S1. No acute hardware complication. IMPRESSION: Intraoperative imaging of L4-5 fixation. Electronically Signed   By: Abigail Miyamoto M.D.   On: 03/14/2015 16:53    Assessment/Plan: Fever to 102.5 last night.  Observe today.  Continue PT.    LOS: 2 days    Shontell Prosser D, MD 03/16/2015, 11:02 AM

## 2015-03-16 NOTE — Progress Notes (Signed)
Physical Therapy Treatment Patient Details Name: Cassandra Alexander MRN: [id number] DOB: 12-25-1945 Today's Date: 03/16/2015    History of Present Illness 70 y.o. female post L4-L5 decompression and fusion due to spondylolisthesis, facet arthropathy and moderately severe stenosis on 03/14/15.     PT Comments    Pt making steady progress. Need increased time to process information/cues today. Cues on safety (hand placement with transfers, to not walk away from walker, to turn with walker to sit on bed when back in from walking in hallway- pt walked up to bed facing it with walker and stopped-unsure what to do). Will have spouse and family assist at home to reinforce back precautions and safety.    Follow Up Recommendations  Home health PT     Equipment Recommendations  3in1 (PT)    Recommendations for Other Services OT consult     Precautions / Restrictions Precautions Precautions: Back Precaution Booklet Issued:  (one in room) Precaution Comments: reinforced back precautions throughout session Required Braces or Orthoses: Spinal Brace Spinal Brace: Lumbar corset;Applied in sitting position;Other (comment) Spinal Brace Comments: min assist to don brace at edge of bed, supervision to doff brace Restrictions Weight Bearing Restrictions: No    Mobility  Bed Mobility Overal bed mobility: Needs Assistance Bed Mobility: Rolling;Sidelying to Sit;Sit to Sidelying Rolling: Supervision Sidelying to sit: Supervision     Sit to sidelying: Supervision General bed mobility comments: bed flat and no rails. cues on sequencing and technique.   Transfers Overall transfer level: Needs assistance Equipment used: Rolling walker (2 wheeled) Transfers: Sit to/from Stand Sit to Stand: Min guard         General transfer comment: cues for hand placement and sequencing/technique  Ambulation/Gait Ambulation/Gait assistance: Supervision Ambulation Distance (Feet): 150 Feet Assistive device:  Rolling walker (2 wheeled) Gait Pattern/deviations: Step-through pattern;Decreased stride length Gait velocity: decreased Gait velocity interpretation: Below normal speed for age/gender General Gait Details: cues on posture and walker position with gait       Cognition Arousal/Alertness: Awake/alert Behavior During Therapy: WFL for tasks assessed/performed Overall Cognitive Status: No family/caregiver present to determine baseline cognitive functioning (slow processing)       Memory: Decreased recall of precautions               Pertinent Vitals/Pain Pain Assessment: 0-10 Pain Score: 5  Pain Location: back Pain Descriptors / Indicators: Aching;Sore Pain Intervention(s): Limited activity within patient's tolerance;Monitored during session;Premedicated before session;Repositioned     PT Goals (current goals can now be found in the care plan section) Acute Rehab PT Goals Patient Stated Goal: not stated PT Goal Formulation: With patient Time For Goal Achievement: 03/29/15 Potential to Achieve Goals: Good Progress towards PT goals: Progressing toward goals    Frequency  Min 5X/week    PT Plan Current plan remains appropriate    End of Session Equipment Utilized During Treatment: Gait belt;Back brace Activity Tolerance: Patient tolerated treatment well Patient left: with nursing/sitter in room;with call bell/phone within reach;with SCD's reapplied     Time: 1554-1610 PT Time Calculation (min) (ACUTE ONLY): 16 min  Charges:  $Gait Training: 8-22 mins           Willow Ora 03/16/2015, 4:13 PM  Willow Ora, PTA, [phone] 03/16/2015, 4:16 PM

## 2015-03-16 NOTE — Progress Notes (Signed)
Occupational Therapy Treatment Patient Details Name: Cassandra Alexander MRN: [id number] DOB: Jun 03, 1945 Today's Date: 03/16/2015    History of present illness 70 y.o. female post L4-L5 decompression and fusion due to spondylolisthesis, facet arthropathy and moderately severe stenosis on 03/14/15.    OT comments  Education provided in session. Plan for one more session to review/reinforce precautions prior to d/c home.  Follow Up Recommendations  No OT follow up;Supervision - Intermittent    Equipment Recommendations  3 in 1 bedside comode    Recommendations for Other Services      Precautions / Restrictions Precautions Precautions: Back Precaution Booklet Issued:  (one in room) Precaution Comments: educated/reviewed back precautions Required Braces or Orthoses: Spinal Brace Spinal Brace: Lumbar corset;Applied in sitting position Restrictions Weight Bearing Restrictions: No       Mobility Bed Mobility Overal bed mobility: Needs Assistance Bed Mobility: Rolling;Sidelying to Sit;Sit to Sidelying Rolling: Supervision Sidelying to sit: Supervision     Sit to sidelying: Supervision General bed mobility comments: trendelenburg position and assist given for pt to scoot towards HOB.   Transfers Overall transfer level: Needs assistance   Transfers: Sit to/from Stand Sit to Stand: Min guard         General transfer comment: cue for hand placement. Stood with and without RW in front.    Balance       Stepped over simulated tub with Min guard assist.                             ADL Overall ADL's : Needs assistance/impaired                 Upper Body Dressing : Minimal assistance;Sitting Upper Body Dressing Details (indicate cue type and reason): back brace Lower Body Dressing: Sitting/lateral leans;Supervision/safety Lower Body Dressing Details (indicate cue type and reason): donned/doffed sock Toilet Transfer: Min guard;Ambulation;RW (sit to stand from  bed)       Tub/ Shower Transfer: Tub transfer;Ambulation;Rolling walker (Min guard with exception of OT helping position RW after pt had stepped out of simulated tub)   Functional mobility during ADLs: Rolling walker;Min guard (Min guard-supervision (also helped position RW after pt had stepped out of simulated tub)  General ADL Comments: Discussed incorporating precautions into functional activites. Cues for precautions in session. Educated on back brace. Encouraged pt to get up and walk with nursing. Educated on use of bag on walker and discussed safety with tub transfer.       Vision                     Perception     Praxis      Cognition  Awake/Alert Behavior During Therapy: WFL for tasks assessed/performed Overall Cognitive Status: No family/caregiver present to determine baseline cognitive functioning (slow processing and decreased short term memory/recall of precautions)                       Extremity/Trunk Assessment               Exercises     Shoulder Instructions       General Comments      Pertinent Vitals/ Pain       Pain Assessment: 0-10 Pain Score: 7  Pain Location: back and neck Pain Intervention(s): Monitored during session;Repositioned  Home Living  Prior Functioning/Environment              Frequency Min 2X/week     Progress Toward Goals  OT Goals(current goals can now be found in the care plan section)  Progress towards OT goals: Progressing toward goals  Acute Rehab OT Goals Patient Stated Goal: not stated OT Goal Formulation: With patient Time For Goal Achievement: 03/22/15 Potential to Achieve Goals: Good ADL Goals Pt Will Perform Lower Body Dressing: sit to/from stand;with set-up Pt Will Transfer to Toilet: ambulating;with set-up Pt Will Perform Toileting - Clothing Manipulation and hygiene: with set-up;sit to/from stand Pt Will Perform  Tub/Shower Transfer: Tub transfer;with supervision;with set-up;ambulating;rolling walker;3 in 1 Additional ADL Goal #1: Pt will independently verbalize 3/3 precautions and maintain during session.  Plan Discharge plan remains appropriate    Co-evaluation                 End of Session Equipment Utilized During Treatment: Gait belt;Rolling walker;Back brace   Activity Tolerance Patient tolerated treatment well (however, did not want to sit up due to pain)   Patient Left in bed;with call bell/phone within reach;with bed alarm set   Nurse Communication          Time: 3353-3174 OT Time Calculation (min): 18 min  Charges: OT General Charges $OT Visit: 1 Procedure OT Treatments $Self Care/Home Management : 8-22 mins  Teisha Trowbridge L 03/16/2015, 1:47 PM

## 2015-03-17 MED ORDER — HYDROCODONE-ACETAMINOPHEN 5-325 MG PO TABS: 1.0000 | ORAL_TABLET | ORAL | Status: DC | PRN

## 2015-03-17 NOTE — Progress Notes (Signed)
Physical Therapy Treatment Patient Details Name: Cassandra Alexander MRN: [id number] DOB: Apr 20, 1945 Today's Date: 03/17/2015    History of Present Illness 71 y.o. female post L4-L5 decompression and fusion due to spondylolisthesis, facet arthropathy and moderately severe stenosis on 03/14/15.     PT Comments    Patient eager to go home. Required cues multiple times as assisted to bathroom, walking in room to gather her items to pack for home, and when donning her clothes for trip home. Husband present throughout and asking appropriate questions. Reinforced need for her to use RW as she was slightly unsteady without RW (?either meds or weakness). Educated re: importance of adhering to back precautions to allow back time to heal. Discussed that physician did not order HHPT and not to hesitate to contact physician office if they have questions. RN in to go over d/c instructions and pt/husband reported no further PT questions.    Follow Up Recommendations  Home health PT (although noted Dr. Ellene Route did not agree; not ordered)     Equipment Recommendations  None recommended by PT    Recommendations for Other Services       Precautions / Restrictions Precautions Precautions: Back Precaution Booklet Issued:  (one in room) Precaution Comments: pt able to state 3/3; required moderate cues to adhere to these Required Braces or Orthoses: Spinal Brace Spinal Brace: Lumbar corset;Applied in sitting position Spinal Brace Comments: Set-up for donning of brace seated EOB, reinforcement for brace wearing orders  Restrictions Weight Bearing Restrictions: No    Mobility  Bed Mobility Overal bed mobility: Needs Assistance Bed Mobility: Rolling;Sidelying to Sit Rolling: Min guard Sidelying to sit: Supervision       General bed mobility comments: bed flat and no rails. reinforecment for sequencing and technique.   Transfers Overall transfer level: Needs assistance Equipment used: Rolling walker (2  wheeled);None Transfers: Sit to/from Stand Sit to Stand: Min guard;Supervision         General transfer comment: supervision with RW; slightly unsteady without   Ambulation/Gait Ambulation/Gait assistance: Min assist;Supervision Ambulation Distance (Feet): 30 Feet Assistive device: Rolling walker (2 wheeled);None Gait Pattern/deviations: Step-through pattern;Shuffle;Decreased stride length Gait velocity: decreased   General Gait Details: cues to prevent twisting as reaching for items to pack; slightly unsteady without RW   Stairs            Wheelchair Mobility    Modified Rankin (Stroke Patients Only)       Balance           Standing balance support: No upper extremity supported Standing balance-Leahy Scale: Fair                      Cognition Arousal/Alertness: Awake/alert Behavior During Therapy: WFL for tasks assessed/performed Overall Cognitive Status: Impaired/Different from baseline (slow processing; husband "I think it's the percocet") Area of Impairment: Attention;Memory;Following commands;Problem solving;Safety/judgement   Current Attention Level: Sustained Memory: Decreased recall of precautions;Decreased short-term memory Following Commands: Follows one step commands consistently;Follows one step commands with increased time Safety/Judgement: Decreased awareness of safety   Problem Solving: Slow processing;Requires verbal cues;Requires tactile cues General Comments: reinforced need for husband to be close by with all mobility and to continue to use RW    Exercises      General Comments General comments (skin integrity, edema, etc.): Husband present. Both frustrated at slow timing to discharge. Pt wanting to get dressed for going home. Agreed to assist her to bathroom and monitor her dressing.  Pertinent Vitals/Pain Pain Assessment: Faces Faces Pain Scale: Hurts even more Pain Location: back Pain Descriptors / Indicators:  Operative site guarding;Grimacing Pain Intervention(s): Limited activity within patient's tolerance;Monitored during session;Repositioned (pt rather exasperated and did not ask to rate)    Home Living                      Prior Function            PT Goals (current goals can now be found in the care plan section) Acute Rehab PT Goals Patient Stated Goal: go home ASAP Time For Goal Achievement: 03/29/15 Progress towards PT goals: Progressing toward goals    Frequency  Min 5X/week    PT Plan Current plan remains appropriate    Co-evaluation             End of Session Equipment Utilized During Treatment: Back brace Activity Tolerance: Patient tolerated treatment well Patient left: in bed;with call bell/phone within reach;with nursing/sitter in room;with family/visitor present     Time: 5102-5852 PT Time Calculation (min) (ACUTE ONLY): 21 min  Charges:  $Therapeutic Activity: 8-22 mins                    G Codes:      Farris Blash 04/04/2015, 4:28 PM Pager [phone]

## 2015-03-17 NOTE — Discharge Summary (Signed)
Physician Discharge Summary  Patient ID: Cassandra Alexander MRN: [id number] DOB/AGE: 1945-12-21 70 y.o.  Admit date: 03/14/2015 Discharge date: 03/17/2015  Admission Diagnoses:Spondylolisthesis L4-L5 with lumbar radiculopathy  Discharge Diagnoses: Spondylolisthesis L4-L5 with lumbar radiculopathy Active Problems:   Degenerative spondylolisthesis   Discharged Condition: good  Hospital Course: She was admitted to undergo surgery which she tolerated well area she is ambulatory. She is anxious to go home at this time.  Consults: None  Significant Diagnostic Studies: None  Treatments: surgery: Decompression L4-L5 with posterior lumbar interbody arthrodesis peek spacers local autograft allograft pedicle screw fixation L4-L5.  Discharge Exam: Blood pressure 122/60, pulse 81, temperature 98.6 F (37 C), temperature source Oral, resp. rate 20, height 5' (1.524 m), weight 53.071 kg (117 lb), SpO2 96 %. incision is clean and dry. Station and gait are normal. Motor function is intact in lower extremities.  Disposition: Discharge home.  Discharge Instructions    Call MD for:  redness, tenderness, or signs of infection (pain, swelling, redness, odor or green/yellow discharge around incision site)    Complete by:  As directed      Call MD for:  severe uncontrolled pain    Complete by:  As directed      Call MD for:  temperature >100.4    Complete by:  As directed      Diet - low sodium heart healthy    Complete by:  As directed      Increase activity slowly    Complete by:  As directed             Medication List    TAKE these medications        ALPRAZolam 1 MG tablet  Commonly known as:  XANAX  TAKE 1 TABLET BY MOUTH THREE TIMES DAILY AS NEEDED     aspirin 81 MG tablet  Take 81 mg by mouth daily.     estradiol 0.5 MG tablet  Commonly known as:  ESTRACE  Take 1 tablet by mouth 2 (two) times a week.     HYDROcodone-acetaminophen 10-325 MG tablet  Commonly known as:  NORCO   Take 1 tablet by mouth every 6 (six) hours as needed for moderate pain or severe pain.     HYDROcodone-acetaminophen 5-325 MG tablet  Commonly known as:  NORCO/VICODIN  Take 1-2 tablets by mouth every 4 (four) hours as needed for moderate pain.     imipramine 50 MG tablet  Commonly known as:  TOFRANIL  TAKE 3 TABLETS AT BEDTIME         Signed: Assunta Pupo J 03/17/2015, 2:51 PM

## 2015-03-17 NOTE — Care Management Important Message (Signed)
Important Message  Patient Details  Name: Cassandra Alexander MRN: [id number] Date of Birth: Nov 12, 1945   Medicare Important Message Given:  Yes    Louanne Belton 03/17/2015, 12:42 [place] Message  Patient Details  Name: Cassandra Alexander MRN: [id number] Date of Birth: 1945-07-12   Medicare Important Message Given:  Yes    Nirav Sweda, Neal Dy 03/17/2015, 12:42 PM

## 2015-03-17 NOTE — Care Management Note (Signed)
Case Management Note  Patient Details  Name: MERSADIES PETREE MRN: [id number] Date of Birth: [birth year]  Subjective/Objective:                    Action/Plan: Plan is to discharge patient home with her husband. Patient has orders for rolling walker and 3 in 1. Patient states she has a walker at home and she does not want the 3 in 1. PT/OT recommending home health. CM spoke with Dr Ellene Route and he doesn't feel patient needs this at this time. Bedside RN updated.   Expected Discharge Date:                  Expected Discharge Plan:  Home/Self Care  In-House Referral:     Discharge planning Services  CM Consult  Post Acute Care Choice:    Choice offered to:     DME Arranged:    DME Agency:     HH Arranged:    [place] Agency:     Status of Service:  Completed, signed off  Medicare Important Message Given:  Yes Date Medicare IM Given:    Medicare IM give by:    Date Additional Medicare IM Given:    Additional Medicare Important Message give by:     If discussed at [place] of Stay Meetings, dates discussed:    Additional Comments:  Pollie Friar, RN 03/17/2015, 2:56 PM

## 2015-03-17 NOTE — Progress Notes (Signed)
Patient is discharged from room 5M18 at this time. Alert and in stable condition. IV site d/c'd. Instructions read to patient and husband with understanding verbalized. Left unit via wheelchair with all belongings at side.

## 2015-03-17 NOTE — Progress Notes (Signed)
Occupational Therapy Treatment Patient Details Name: Cassandra Alexander MRN: [id number] DOB: 1945/06/13 Today's Date: 03/17/2015    History of present illness 70 y.o. female post L4-L5 decompression and fusion due to spondylolisthesis, facet arthropathy and moderately severe stenosis on 03/14/15.    OT comments  Patient progressing towards OT goals, continue plan of care for now. Pt continues to require cueing to adhere to back precautions and reinforcement for importance of mobility. Pt with slow processing and difficulty comprehending importance of lumbar corset during ALL OOB mobility. Discharge plan changed to [facility], pt will benefit from post acute OT for safety and independence at home.    Follow Up Recommendations  Home health OT;Supervision - Intermittent    Equipment Recommendations  3 in 1 bedside comode    Recommendations for Other Services  None at this time   Precautions / Restrictions Precautions Precautions: Back Precaution Comments: reinforced back precautions throughout session Required Braces or Orthoses: Spinal Brace Spinal Brace: Lumbar corset;Applied in sitting position;Other (comment) Spinal Brace Comments: Set-up for donning of brace seated EOB, reinforcement for brace wearing orders  Restrictions Weight Bearing Restrictions: No    Mobility Bed Mobility Overal bed mobility: Needs Assistance Bed Mobility: Rolling;Sidelying to Sit Rolling: Supervision Sidelying to sit: Supervision       General bed mobility comments: bed flat and no rails. reinforecment for sequencing and technique.   Transfers Overall transfer level: Needs assistance Equipment used: Rolling walker (2 wheeled) Transfers: Sit to/from Stand Sit to Stand: Min guard General transfer comment: cues for hand placement and sequencing/technique    Balance Overall balance assessment: Needs assistance Sitting-balance support: No upper extremity supported;Feet supported Sitting balance-Leahy Scale:  Good     Standing balance support: Bilateral upper extremity supported;During functional activity Standing balance-Leahy Scale: Good   ADL Overall ADL's : Needs assistance/impaired     Grooming: Set up;Supervision/safety;Oral care;Standing Grooming Details (indicate cue type and reason): at sink, education on making sure she adheres to back precautions during grooming tasks   Upper Body Bathing Details (indicate cue type and reason): Pt able to don lumbar corset with set-up assist  Toilet Transfer: Min guard;Ambulation;RW   Toileting- Clothing Manipulation and Hygiene: Supervision/safety;Sitting/lateral lean     Tub/Shower Transfer Details (indicate cue type and reason): Educated patient on how to position 3-n-1 in tub/shower and importance of sitting for showers secondary to lumbar corset wearing orders  Functional mobility during ADLs: Rolling walker;Min guard General ADL Comments: Reinforced back precautions during functional tasks. Pt able to recall 4/4 back precautions (no bending, arching, twisting, lifting). Pt had a hard time comprehending need to sit during showers, therapist went over this multiple times.      Cognition   Behavior During Therapy: WFL for tasks assessed/performed Overall Cognitive Status: No family/caregiver present to determine baseline cognitive functioning (slow processing and difficulty understanding brace wearing orders ) Memory: Decreased recall of precautions (functionally, pt able to verbalize precautions)                  Pertinent Vitals/ Pain       Pain Assessment: 0-10 Pain Score: 8  Pain Location: back Pain Descriptors / Indicators: Aching;Sore Pain Intervention(s): Limited activity within patient's tolerance;Monitored during session;Repositioned;Patient requesting pain meds-RN notified   Frequency Min 2X/week     Progress Toward Goals  OT Goals(current goals can now befound in the care plan section)  Progress towards OT goals:  Progressing toward goals     Plan Discharge plan needs to be updated  End of Session Equipment Utilized During Treatment: Rolling walker;Back brace   Activity Tolerance Patient tolerated treatment well   Patient Left in chair;with call bell/phone within reach  Nurse Communication Other (comment) (need for pain meds and pt seated up in chair)     Time: 7121-9758 OT Time Calculation (min): 29 min  Charges: OT General Charges $OT Visit: 1 Procedure OT Treatments $Self Care/Home Management : 23-37 mins  Chrys Racer , MS, OTR/L, CLT Pager: [phone]  03/17/2015, 10:21 AM

## 2015-03-18 MED FILL — Sodium Chloride IV Soln 0.9%: INTRAVENOUS | Qty: 1000 | Status: AC

## 2015-03-18 MED FILL — Heparin Sodium (Porcine) Inj 1000 Unit/ML: INTRAMUSCULAR | Qty: 30 | Status: AC

## 2015-04-17 DIAGNOSIS — M4310 Spondylolisthesis, site unspecified: Secondary | ICD-10-CM | POA: Diagnosis not present

## 2015-05-14 DIAGNOSIS — M4310 Spondylolisthesis, site unspecified: Secondary | ICD-10-CM | POA: Diagnosis not present

## 2015-06-18 DIAGNOSIS — M4310 Spondylolisthesis, site unspecified: Secondary | ICD-10-CM | POA: Diagnosis not present

## 2015-06-18 DIAGNOSIS — I10 Essential (primary) hypertension: Secondary | ICD-10-CM | POA: Diagnosis not present

## 2015-06-24 DIAGNOSIS — M6248 Contracture of muscle, other site: Secondary | ICD-10-CM | POA: Diagnosis not present

## 2015-06-24 DIAGNOSIS — M545 Low back pain: Secondary | ICD-10-CM | POA: Diagnosis not present

## 2015-06-24 DIAGNOSIS — M2560 Stiffness of unspecified joint, not elsewhere classified: Secondary | ICD-10-CM | POA: Diagnosis not present

## 2015-06-24 DIAGNOSIS — M6281 Muscle weakness (generalized): Secondary | ICD-10-CM | POA: Diagnosis not present

## 2015-06-24 DIAGNOSIS — M150 Primary generalized (osteo)arthritis: Secondary | ICD-10-CM | POA: Diagnosis not present

## 2015-06-26 DIAGNOSIS — M6281 Muscle weakness (generalized): Secondary | ICD-10-CM | POA: Diagnosis not present

## 2015-06-26 DIAGNOSIS — M6248 Contracture of muscle, other site: Secondary | ICD-10-CM | POA: Diagnosis not present

## 2015-06-26 DIAGNOSIS — M545 Low back pain: Secondary | ICD-10-CM | POA: Diagnosis not present

## 2015-06-26 DIAGNOSIS — M150 Primary generalized (osteo)arthritis: Secondary | ICD-10-CM | POA: Diagnosis not present

## 2015-06-26 DIAGNOSIS — M2560 Stiffness of unspecified joint, not elsewhere classified: Secondary | ICD-10-CM | POA: Diagnosis not present

## 2015-07-07 ENCOUNTER — Emergency Department (HOSPITAL_COMMUNITY): Payer: PPO

## 2015-07-07 ENCOUNTER — Encounter (HOSPITAL_COMMUNITY): Payer: Self-pay | Admitting: Emergency Medicine

## 2015-07-07 ENCOUNTER — Observation Stay (HOSPITAL_COMMUNITY)
Admission: EM | Admit: 2015-07-07 | Discharge: 2015-07-08 | Disposition: A | Discharge status: Home / Self Care | Payer: PPO | Attending: Internal Medicine | Admitting: Internal Medicine

## 2015-07-07 DIAGNOSIS — R200 Anesthesia of skin: Secondary | ICD-10-CM | POA: Diagnosis not present

## 2015-07-07 DIAGNOSIS — Z87891 Personal history of nicotine dependence: Secondary | ICD-10-CM | POA: Insufficient documentation

## 2015-07-07 DIAGNOSIS — R51 Headache: Secondary | ICD-10-CM | POA: Diagnosis not present

## 2015-07-07 DIAGNOSIS — G459 Transient cerebral ischemic attack, unspecified: Principal | ICD-10-CM | POA: Diagnosis present

## 2015-07-07 DIAGNOSIS — R519 Headache, unspecified: Secondary | ICD-10-CM

## 2015-07-07 DIAGNOSIS — F329 Major depressive disorder, single episode, unspecified: Secondary | ICD-10-CM | POA: Insufficient documentation

## 2015-07-07 DIAGNOSIS — F418 Other specified anxiety disorders: Secondary | ICD-10-CM | POA: Diagnosis not present

## 2015-07-07 DIAGNOSIS — F419 Anxiety disorder, unspecified: Secondary | ICD-10-CM | POA: Insufficient documentation

## 2015-07-07 DIAGNOSIS — Z7982 Long term (current) use of aspirin: Secondary | ICD-10-CM | POA: Diagnosis not present

## 2015-07-07 DIAGNOSIS — Z96651 Presence of right artificial knee joint: Secondary | ICD-10-CM | POA: Diagnosis not present

## 2015-07-07 DIAGNOSIS — Z8679 Personal history of other diseases of the circulatory system: Secondary | ICD-10-CM | POA: Insufficient documentation

## 2015-07-07 DIAGNOSIS — R202 Paresthesia of skin: Secondary | ICD-10-CM | POA: Diagnosis not present

## 2015-07-07 DIAGNOSIS — R208 Other disturbances of skin sensation: Secondary | ICD-10-CM | POA: Diagnosis not present

## 2015-07-07 DIAGNOSIS — E785 Hyperlipidemia, unspecified: Secondary | ICD-10-CM | POA: Insufficient documentation

## 2015-07-07 DIAGNOSIS — Z8673 Personal history of transient ischemic attack (TIA), and cerebral infarction without residual deficits: Secondary | ICD-10-CM | POA: Diagnosis not present

## 2015-07-07 DIAGNOSIS — G44219 Episodic tension-type headache, not intractable: Secondary | ICD-10-CM

## 2015-07-07 DIAGNOSIS — G44229 Chronic tension-type headache, not intractable: Secondary | ICD-10-CM | POA: Diagnosis not present

## 2015-07-07 DIAGNOSIS — M50222 Other cervical disc displacement at C5-C6 level: Secondary | ICD-10-CM | POA: Diagnosis not present

## 2015-07-07 DIAGNOSIS — M50223 Other cervical disc displacement at C6-C7 level: Secondary | ICD-10-CM | POA: Diagnosis not present

## 2015-07-07 DIAGNOSIS — Z9889 Other specified postprocedural states: Secondary | ICD-10-CM

## 2015-07-07 DIAGNOSIS — Z880 Allergy status to penicillin: Secondary | ICD-10-CM | POA: Diagnosis not present

## 2015-07-07 DIAGNOSIS — R29818 Other symptoms and signs involving the nervous system: Secondary | ICD-10-CM | POA: Diagnosis not present

## 2015-07-07 DIAGNOSIS — F32A Depression, unspecified: Secondary | ICD-10-CM | POA: Diagnosis present

## 2015-07-07 HISTORY — DX: Headache, unspecified: R51.9

## 2015-07-07 HISTORY — DX: Transient cerebral ischemic attack, unspecified: G45.9

## 2015-07-07 HISTORY — DX: Anesthesia of skin: R20.0

## 2015-07-07 LAB — I-STAT CHEM 8, ED
BUN: 12 mg/dL (ref 6–20)
Calcium, Ion: 1.17 mmol/L (ref 1.13–1.30)
Chloride: 100 mmol/L — ABNORMAL LOW (ref 101–111)
Creatinine, Ser: 0.6 mg/dL (ref 0.44–1.00)
Glucose, Bld: 152 mg/dL — ABNORMAL HIGH (ref 65–99)
HCT: 42 % (ref 36.0–46.0)
Hemoglobin: 14.3 g/dL (ref 12.0–15.0)
Potassium: 3.7 mmol/L (ref 3.5–5.1)
Sodium: 140 mmol/L (ref 135–145)
TCO2: 27 mmol/L (ref 0–100)

## 2015-07-07 LAB — CBC
HCT: 39 % (ref 36.0–46.0)
Hemoglobin: 12.7 g/dL (ref 12.0–15.0)
MCH: 29.4 pg (ref 26.0–34.0)
MCHC: 32.6 g/dL (ref 30.0–36.0)
MCV: 90.3 fL (ref 78.0–100.0)
Platelets: 266 10*3/uL (ref 150–400)
RBC: 4.32 MIL/uL (ref 3.87–5.11)
RDW: 14.3 % (ref 11.5–15.5)
WBC: 7.7 10*3/uL (ref 4.0–10.5)

## 2015-07-07 LAB — COMPREHENSIVE METABOLIC PANEL
ALT: 18 U/L (ref 14–54)
AST: 25 U/L (ref 15–41)
Albumin: 3.8 g/dL (ref 3.5–5.0)
Alkaline Phosphatase: 58 U/L (ref 38–126)
Anion gap: 10 (ref 5–15)
BUN: 10 mg/dL (ref 6–20)
CO2: 28 mmol/L (ref 22–32)
Calcium: 9.1 mg/dL (ref 8.9–10.3)
Chloride: 103 mmol/L (ref 101–111)
Creatinine, Ser: 0.67 mg/dL (ref 0.44–1.00)
GFR calc Af Amer: >60 mL/min (ref >60)
GFR calc non Af Amer: >60 mL/min (ref >60)
Glucose, Bld: 157 mg/dL — ABNORMAL HIGH (ref 65–99)
Potassium: 3.8 mmol/L (ref 3.5–5.1)
Sodium: 141 mmol/L (ref 135–145)
Total Bilirubin: 0.3 mg/dL (ref 0.3–1.2)
Total Protein: 6.5 g/dL (ref 6.5–8.1)

## 2015-07-07 LAB — DIFFERENTIAL
Basophils Absolute: 0 10*3/uL (ref 0.0–0.1)
Basophils Relative: 0 %
Eosinophils Absolute: 0 10*3/uL (ref 0.0–0.7)
Eosinophils Relative: 0 %
Lymphocytes Relative: 40 %
Lymphs Abs: 3.1 10*3/uL (ref 0.7–4.0)
Monocytes Absolute: 0.4 10*3/uL (ref 0.1–1.0)
Monocytes Relative: 5 %
Neutro Abs: 4.2 10*3/uL (ref 1.7–7.7)
Neutrophils Relative %: 55 %

## 2015-07-07 LAB — I-STAT TROPONIN, ED: Troponin i, poc: 0 ng/mL (ref 0.00–0.08)

## 2015-07-07 LAB — PROTIME-INR
INR: 1.06 (ref 0.00–1.49)
Prothrombin Time: 14 seconds (ref 11.6–15.2)

## 2015-07-07 LAB — APTT: aPTT: 28 seconds (ref 24–37)

## 2015-07-07 MED ORDER — SODIUM CHLORIDE 0.9 % IV SOLN
INTRAVENOUS | Status: AC
  Administered 2015-07-07: 20:00:00 via INTRAVENOUS

## 2015-07-07 MED ORDER — ALPRAZOLAM 0.5 MG PO TABS
1.0000 mg | ORAL_TABLET | Freq: Three times a day (TID) | ORAL | Status: DC | PRN
  Administered 2015-07-07: 1 mg via ORAL
  Filled 2015-07-07: qty 2

## 2015-07-07 MED ORDER — ACETAMINOPHEN 325 MG PO TABS: 650.0000 mg | ORAL_TABLET | ORAL | Status: DC | PRN

## 2015-07-07 MED ORDER — ASPIRIN EC 81 MG PO TBEC
81.0000 mg | DELAYED_RELEASE_TABLET | Freq: Every day | ORAL | Status: DC
  Administered 2015-07-07: 81 mg via ORAL
  Filled 2015-07-07 (×2): qty 1

## 2015-07-07 MED ORDER — STROKE: EARLY STAGES OF RECOVERY BOOK
Freq: Once | Status: AC
  Administered 2015-07-07: 20:00:00
  Filled 2015-07-07: qty 1

## 2015-07-07 MED ORDER — IMIPRAMINE HCL 50 MG PO TABS
150.0000 mg | ORAL_TABLET | Freq: Every day | ORAL | Status: DC
  Administered 2015-07-07: 150 mg via ORAL
  Filled 2015-07-07: qty 3

## 2015-07-07 MED ORDER — ACETAMINOPHEN 650 MG RE SUPP: 650.0000 mg | RECTAL | Status: DC | PRN

## 2015-07-07 MED ORDER — HYDROCODONE-ACETAMINOPHEN 5-325 MG PO TABS
1.0000 | ORAL_TABLET | Freq: Four times a day (QID) | ORAL | Status: DC | PRN
  Administered 2015-07-07 – 2015-07-08 (×2): 1 via ORAL
  Filled 2015-07-07: qty 2
  Filled 2015-07-07: qty 1

## 2015-07-07 NOTE — ED Notes (Signed)
Pt asking that she does not get admitted. Pt reassured and stated that she wants to go home. MD made aware of the patient's decision

## 2015-07-07 NOTE — Consult Note (Signed)
Requesting Physician: Dr. Wilson Singer    Chief Complaint: Left sided tingling  History obtained from:  Patient     HPI:                                                                                                                                         Cassandra Alexander is an 70 y.o. female who has been having left sided tingling which is off and on for the past three weeks. She states she has had HA and has had flashing lights in her vision before but not consistent. Over the past three weeks her left shoulder will go numb and in the course of 1 minute it will spread down her arm into her left leg. She has not been paying much attention to this but today her husband wanted her to be seen in ED.  Currently her symptoms have resolved. She is very anxious and states the symptoms can occur when she is anxious. She has no facial involvement. She has had a hysterectomy but still has her ovaries. She is unsure about onset of menopause.   Date last known well: unknown Time last known well: unknown tPA Given: No: unknown     Past Medical History  Diagnosis Date  . Depression   . Osteoarthritis   . Hyperlipidemia   . Anxiety   . Diverticula of colon   . IBS (irritable bowel syndrome)     Past Surgical History  Procedure Laterality Date  . Tonsillectomy    . Replacement total knee Right   . Cesarean section      X 2  . Rectal prolapse repair    . Colonoscopy      No family history on file. Social History:  reports that she quit smoking about 25 years ago. She has never used smokeless tobacco. She reports that she drinks alcohol. She reports that she does not use illicit drugs.  Allergies:  Allergies  Allergen Reactions  . Cortisone Swelling    Caused facial swelling and headache after receiving cortisone injection  . Penicillins Other (See Comments)    Unspecified     Medications:                                                                                                                            No current facility-administered medications for this  encounter.   Current Outpatient Prescriptions  Medication Sig Dispense Refill  . ALPRAZolam (XANAX) 1 MG tablet TAKE 1 TABLET BY MOUTH THREE TIMES DAILY AS NEEDED 90 tablet 5  . aspirin 81 MG tablet Take 81 mg by mouth daily.      Marland Kitchen estradiol (ESTRACE) 0.5 MG tablet Take 1 tablet by mouth 2 (two) times a week.  11  . HYDROcodone-acetaminophen (NORCO) 10-325 MG tablet Take 1 tablet by mouth every 6 (six) hours as needed for moderate pain or severe pain.    Marland Kitchen HYDROcodone-acetaminophen (NORCO/VICODIN) 5-325 MG tablet Take 1-2 tablets by mouth every 4 (four) hours as needed for moderate pain. 60 tablet 0  . imipramine (TOFRANIL) 50 MG tablet TAKE 3 TABLETS AT BEDTIME 180 tablet 6      ROS:                                                                                                                                       History obtained from the patient  General ROS: negative for - chills, fatigue, fever, night sweats, weight gain or weight loss Psychological ROS: negative for - behavioral disorder, hallucinations, memory difficulties, mood swings or suicidal ideation Ophthalmic ROS: negative for - blurry vision, double vision, eye pain or loss of vision ENT ROS: negative for - epistaxis, nasal discharge, oral lesions, sore throat, tinnitus or vertigo Allergy and Immunology ROS: negative for - hives or itchy/watery eyes Hematological and Lymphatic ROS: negative for - bleeding problems, bruising or swollen lymph nodes Endocrine ROS: negative for - galactorrhea, hair pattern changes, polydipsia/polyuria or temperature intolerance Respiratory ROS: negative for - cough, hemoptysis, shortness of breath or wheezing Cardiovascular ROS: negative for - chest pain, dyspnea on exertion, edema or irregular heartbeat Gastrointestinal ROS: negative for - abdominal pain, diarrhea, hematemesis, nausea/vomiting or stool  incontinence Genito-Urinary ROS: negative for - dysuria, hematuria, incontinence or urinary frequency/urgency Musculoskeletal ROS: negative for - joint swelling or muscular weakness Neurological ROS: as noted in HPI Dermatological ROS: negative for rash and skin lesion changes  Neurologic Examination:                                                                                                      Blood pressure 129/80, pulse 73, temperature 97.8 F (36.6 C), temperature source Oral, resp. rate 18, height 4' 11.84" (1.52 m), weight 54.2 kg (119 lb 7.8 oz), SpO2 99 %.  HEENT-  Normocephalic, no lesions, without obvious abnormality.  Normal external eye and  conjunctiva.  Normal TM's bilaterally.  Normal auditory canals and external ears. Normal external nose, mucus membranes and septum.  Normal pharynx. Cardiovascular- S1, S2 normal, pulses palpable throughout   Lungs- chest clear, no wheezing, rales, normal symmetric air entry Abdomen- normal findings: bowel sounds normal Extremities- no edema Lymph-no adenopathy palpable Musculoskeletal-no joint tenderness, deformity or swelling Skin-warm and dry, no hyperpigmentation, vitiligo, or suspicious lesions  Neurological Examination Mental Status: Alert, oriented, thought content appropriate.  Speech fluent without evidence of aphasia.  Able to follow 3 step commands without difficulty. Cranial Nerves: II: Discs flat bilaterally; Visual fields grossly normal, pupils equal, round, reactive to light and accommodation III,IV, VI: ptosis not present, extra-ocular motions intact bilaterally V,VII: smile symmetric, facial light touch sensation normal bilaterally VIII: hearing normal bilaterally IX,X: uvula rises symmetrically XI: bilateral shoulder shrug XII: midline tongue extension Motor: Right : Upper extremity   5/5    Left:     Upper extremity   5/5  Lower extremity   5/5     Lower extremity   5/5 Tone and bulk:normal tone throughout; no  atrophy noted Sensory: Pinprick and light touch intact throughout, bilaterally Deep Tendon Reflexes: 2+ and symmetric throughout Plantars: Right: downgoing   Left: downgoing Cerebellar: normal finger-to-nose, normal rapid alternating movements and normal heel-to-shin test Gait: normal gait and station       Lab Results: Basic Metabolic Panel:  Recent Labs Lab 07/07/15 1415  NA 140  K 3.7  CL 100*  GLUCOSE 152*  BUN 12  CREATININE 0.60    Liver Function Tests: No results for input(s): AST, ALT, ALKPHOS, BILITOT, PROT, ALBUMIN in the last 168 hours. No results for input(s): LIPASE, AMYLASE in the last 168 hours. No results for input(s): AMMONIA in the last 168 hours.  CBC:  Recent Labs Lab 07/07/15 1414 07/07/15 1415  WBC 7.7  --   NEUTROABS 4.2  --   HGB 12.7 14.3  HCT 39.0 42.0  MCV 90.3  --   PLT 266  --     Cardiac Enzymes: No results for input(s): CKTOTAL, CKMB, CKMBINDEX, TROPONINI in the last 168 hours.  Lipid Panel: No results for input(s): CHOL, TRIG, HDL, CHOLHDL, VLDL, LDLCALC in the last 168 hours.  CBG: No results for input(s): GLUCAP in the last 168 hours.  Microbiology: Results for orders placed or performed during the hospital encounter of 03/04/15  Surgical pcr screen     Status: None   Collection Time: 03/04/15  1:48 PM  Result Value Ref Range Status   MRSA, PCR NEGATIVE NEGATIVE Final   Staphylococcus aureus NEGATIVE NEGATIVE Final    Comment:        The Xpert SA Assay (FDA approved for NASAL specimens in patients over 49 years of age), is one component of a comprehensive surveillance program.  Test performance has been validated by [facility] for patients greater than or equal to 16 year old. It is not intended to diagnose infection nor to guide or monitor treatment.     Coagulation Studies:  Recent Labs  07/07/15 1414  LABPROT 14.0  INR 1.06    Imaging: Ct Head Wo Contrast  07/07/2015  CLINICAL DATA:  Code  stroke. Left-sided weakness, progressively worsening. EXAM: CT HEAD WITHOUT CONTRAST TECHNIQUE: Contiguous axial images were obtained from the base of the skull through the vertex without intravenous contrast. 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 This is unchanged since 2010. No acute intracranial abnormality.  Specifically, no hemorrhage, hydrocephalus, mass lesion, acute infarction, or significant intracranial injury. No acute calvarial abnormality. Visualized paranasal sinuses and mastoids clear. Orbital soft tissues unremarkable. IMPRESSION: No acute intracranial abnormality. Critical Value/emergent results were called by telephone at the time of interpretation on 07/07/2015 at 2:24 pm to Dr. Leonel Ramsay, who verbally acknowledged these results. Electronically Signed   By: Rolm Baptise M.D.   On: 07/07/2015 14:26       Assessment and plan discussed with with attending physician and they are in agreement.    Etta Quill PA-C [facility] [phone]  07/07/2015, 2:29 PM   Assessment: 71 y.o. female with intermittent left arm and leg numbness which does not involve the face. Time of onset not clear but believed to be three weeks ago. Currently her symptoms have fully resolved. The symptoms are associated with headache, but she does not have a history of complex migraines, and at 69 it would be unusual to have her first. I am concerned that this may represent stuttering lacunar TIA, and if there is no other reason found for this I think this needs to be worked up. Cervical spine disease could be considered, but if no evidence of this then I would pursue a TIA workup. Less likely could be partial seizure, but I feel this is much less likely.   Stroke Risk Factors - hyperlipidemia  MRI brain/C-spine - if no acute findigns, would pursue TIA workup as follows:  1. HgbA1c, fasting lipid panel 2. CTA head and neck.  3. Frequent neuro  checks 4. Echocardiogram 5. Carotid dopplers not needed given CTA 6. Prophylactic therapy-Antiplatelet med: Aspirin - dose 387m PO or 3090mPR 7. Risk factor modification 8. Telemetry monitoring 9. PT consult, OT consult, Speech consult 10. please page stroke NP  Or  PA  Or MD  M-F from 8am -4 pm starting 4/25 as this patient will be followed by the stroke team at this point.   You can look them up on [email or web address]   11. EEG  McRoland RackMD [facility] [phone] 7pm- 7am, please page neurology [place] call [place]

## 2015-07-07 NOTE — H&P (Signed)
History and Physical    Cassandra Alexander BWI:[id number] DOB: 30-Dec-1945 DOA: 07/07/2015  Referring MD/NP/PA: Dr. Ronnald Ramp   PCP: Donnie Coffin, MD   Outpatient Specialists: none   Patient coming from: home   Chief Complaint: left side numbness and tingling   HPI: Cassandra Alexander is a 70 y.o. female with past medical history significant for headaches, anxiety and depression. Patient presented to [facility] with complaints of ongoing left side numbness, tingling, arm and leg for past few weeks prior to this admission. Patient reports symptoms are more prominent when she is walking and not while she is at rest. Her symptoms can last for short period of time versus more than an hour depending on how long she is walking. Symptoms spontaneously resolve. No specific aggravating factors. No reports of sensation loss. No reports of weakness. No reports of falls. Patient has no associated slurred speech, no facial asymmetry. No reports of chest pain or shortness of breath or palpitations.  ED Course:  In ED, patient was hemodynamically stable. CT head and neck showed no acute intracranial findings. MRI of the cervical spine shows disc osteophyte complex protruding into the left neural foramen at C5-6 compressing the left C6 nerve otherwise no other significant abnormalities. Patient was seen by neurology in consultation. She is admitted for TIA workup.    Review of Systems:  Constitutional: Negative for fever, chills, diaphoresis, activity change, appetite change and fatigue.  HENT: Negative for ear pain, nosebleeds, congestion, facial swelling, rhinorrhea, neck pain, neck stiffness and ear discharge.   Eyes: Negative for pain, discharge, redness, itching and visual disturbance.  Respiratory: Negative for cough, choking, chest tightness, shortness of breath, wheezing and stridor.   Cardiovascular: Negative for chest pain, palpitations and leg swelling.  Gastrointestinal: Negative for abdominal  distention.  Genitourinary: Negative for dysuria, urgency, frequency, hematuria, flank pain, decreased urine volume, difficulty urinating and dyspareunia.  Musculoskeletal: Negative for back pain, joint swelling, arthralgias and gait problem.  Neurological: per HPI Hematological: Negative for adenopathy. Does not bruise/bleed easily.  Psychiatric/Behavioral: Negative for hallucinations, behavioral problems, confusion, dysphoric mood, decreased concentration and agitation.   Past Medical History  Diagnosis Date  . Depression   . Osteoarthritis   . Hyperlipidemia   . Anxiety   . Diverticula of colon   . IBS (irritable bowel syndrome)     Past Surgical History  Procedure Laterality Date  . Tonsillectomy    . Replacement total knee Right   . Cesarean section      X 2  . Rectal prolapse repair    . Colonoscopy       reports that she quit smoking about 25 years ago. She has never used smokeless tobacco. She reports that she drinks alcohol. She reports that she does not use illicit drugs.  Allergies  Allergen Reactions  . Cortisone Swelling    Caused facial swelling and headache after receiving cortisone injection  . Penicillins Other (See Comments)    Unspecified    Family history: hypertension in mother   Prior to Admission medications   Medication Sig Start Date End Date Taking? Authorizing Provider  ALPRAZolam Duanne Moron) 1 MG tablet TAKE 1 TABLET BY MOUTH THREE TIMES DAILY AS NEEDED 12/24/10   Hoover Browns., MD  aspirin 81 MG tablet Take 81 mg by mouth daily.      Historical Provider, MD  estradiol (ESTRACE) 0.5 MG tablet Take 1 tablet by mouth 2 (two) times a week. 01/27/15   Historical  Provider, MD  HYDROcodone-acetaminophen (NORCO) 10-325 MG tablet Take 1 tablet by mouth every 6 (six) hours as needed for moderate pain or severe pain.    Historical Provider, MD  HYDROcodone-acetaminophen (NORCO/VICODIN) 5-325 MG tablet Take 1-2 tablets by mouth every 4 (four)  hours as needed for moderate pain. 03/17/15   Kristeen Miss, MD  imipramine (TOFRANIL) 50 MG tablet TAKE 3 TABLETS AT BEDTIME 06/08/10   Hoover Browns., MD    Physical Exam: Filed Vitals:   07/07/15 1445 07/07/15 1449 07/07/15 1700 07/07/15 1701  BP: 129/72 137/73  153/86  Pulse: 69 66  68  Temp:  97.9 F (36.6 C) 97.8 F (36.6 C) 97.8 F (36.6 C)  TempSrc:  Oral  Axillary  Resp: 14 17  16   Height:      Weight:      SpO2: 98% 98%  98%    Constitutional: NAD, calm, comfortable Filed Vitals:   07/07/15 1445 07/07/15 1449 07/07/15 1700 07/07/15 1701  BP: 129/72 137/73  153/86  Pulse: 69 66  68  Temp:  97.9 F (36.6 C) 97.8 F (36.6 C) 97.8 F (36.6 C)  TempSrc:  Oral  Axillary  Resp: 14 17  16   Height:      Weight:      SpO2: 98% 98%  98%   Eyes: PERRL, lids and conjunctivae normal ENMT: Mucous membranes are moist. Posterior pharynx clear of any exudate or lesions.Normal dentition.  Neck: normal, supple, no masses, no thyromegaly Respiratory: clear to auscultation bilaterally, no wheezing, no crackles. Normal respiratory effort. No accessory muscle use.  Cardiovascular: Regular rate and rhythm, no murmurs / rubs / gallops. No extremity edema. 2+ pedal pulses. No carotid bruits.  Abdomen: no tenderness, no masses palpated. No hepatosplenomegaly. Bowel sounds positive.  Musculoskeletal: no clubbing / cyanosis. No joint deformity upper and lower extremities. Good ROM, no contractures. Normal muscle tone.  Skin: no rashes, lesions, ulcers. No induration Psychiatric: Normal judgment and insight. Alert and oriented x 3. Normal mood.   Neurologic:  Mental Status: Alert, oriented, thought content appropriate. No evidence of aphasia. Able to follow 3 step commands without difficulty. Cranial Nerves: II: Discs flat bilaterally; Visual fields grossly normal, pupils equal, round, reactive to light and accommodation III,IV, VI: ptosis not present, extra-ocular motions intact  bilaterally V,VII: smile symmetric, facial light touch sensation normal bilaterally VIII: hearing normal bilaterally IX,X: uvula rises symmetrically XI: bilateral shoulder shrug XII: midline tongue extension without atrophy or fasciculations  Motor: Right :Upper extremity 5/5Left: Upper extremity 5/5 Lower extremity 5/5Lower extremity 5/5 Tone and bulk:normal tone throughout; no atrophy noted Sensory: Pinprick and light touch intact throughout, bilaterally Deep Tendon Reflexes:  Right: Upper Extremity Left: Upper extremity   biceps (C-5 to C-6) 2/4 biceps (C-5 to C-6) 2/4 tricep (C7) 2/4triceps (C7) 2/4 Brachioradialis (C6) 2/4Brachioradialis (C6) 2/4  Lower Extremity Lower Extremity  quadriceps (L-2 to L-4) 2/4 quadriceps (L-2 to L-4) 2/4 Achilles (S1) 2/4Achilles (S1) 2/4  Plantars: Right: downgoingLeft: downgoing Cerebellar: normal finger-to-nose, normal heel-to-shin test   Labs on Admission: I have personally reviewed following labs and imaging studies  CBC:  Recent Labs Lab 07/07/15 1414 07/07/15 1415  WBC 7.7  --   NEUTROABS 4.2  --   HGB 12.7 14.3  HCT 39.0 42.0  MCV 90.3  --   PLT 266  --    Basic Metabolic Panel:  Recent Labs Lab 07/07/15 1414 07/07/15 1415  NA 141 140  K 3.8 3.7  CL 103  100*  CO2 28  --   GLUCOSE 157* 152*  BUN 10 12  CREATININE 0.67 0.60  CALCIUM 9.1  --    GFR: Estimated Creatinine Clearance: 51.7 mL/min (by C-G formula based on Cr of 0.6). Liver Function Tests:  Recent Labs Lab 07/07/15 1414  AST 25  ALT 18  ALKPHOS 58  BILITOT 0.3  PROT 6.5  ALBUMIN 3.8   No results for input(s): LIPASE, AMYLASE in the last 168  hours. No results for input(s): AMMONIA in the last 168 hours. Coagulation Profile:  Recent Labs Lab 07/07/15 1414  INR 1.06   Cardiac Enzymes: No results for input(s): CKTOTAL, CKMB, CKMBINDEX, TROPONINI in the last 168 hours. BNP (last 3 results) No results for input(s): PROBNP in the last 8760 hours. HbA1C: No results for input(s): HGBA1C in the last 72 hours. CBG: No results for input(s): GLUCAP in the last 168 hours. Lipid Profile: No results for input(s): CHOL, HDL, LDLCALC, TRIG, CHOLHDL, LDLDIRECT in the last 72 hours. Thyroid Function Tests: No results for input(s): TSH, T4TOTAL, FREET4, T3FREE, THYROIDAB in the last 72 hours. Anemia Panel: No results for input(s): VITAMINB12, FOLATE, FERRITIN, TIBC, IRON, RETICCTPCT in the last 72 hours. Urine analysis:    Component Value Date/Time   COLORURINE yellow 03/31/2009 1031   APPEARANCEUR Clear 03/31/2009 1031   LABSPEC 1.020 03/31/2009 1031   PHURINE 6.5 03/31/2009 1031   HGBUR negative 03/31/2009 1031   BILIRUBINUR 1+ 06/09/2010   BILIRUBINUR 2+ 03/31/2009 1031   PROTEINUR trace 06/09/2010   UROBILINOGEN 0.2 06/09/2010   UROBILINOGEN 0.2 03/31/2009 1031   NITRITE n 06/09/2010   NITRITE negative 03/31/2009 1031   LEUKOCYTESUR n 06/09/2010   Sepsis Labs: @LABRCNTIP (procalcitonin:4,lacticidven:4) )No results found for this or any previous visit (from the past 240 hour(s)).   Radiological Exams on Admission: Ct Head Wo Contrast 07/07/2015   No acute intracranial abnormality. Critical Value/emergent results were called by telephone at the time of interpretation on 07/07/2015 at 2:24 pm to Dr. Leonel Ramsay, who verbally acknowledged these results. Electronically Signed   By: Rolm Baptise M.D.   On: 07/07/2015 14:26   Mr Brain Wo Contrast 07/07/2015   No acute infarct or intracranial hemorrhage. Post endovascular treatment of right carotid aneurysm. This is not adequately assessed on the current exam. 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 Remote tiny posterior right centrum semiovale infarct. Mild chronic microvascular changes. Mild global atrophy without hydrocephalus. Mild mucosal thickening right sphenoid sinus with minimal mucosal thickening ethmoid sinus air cells bilaterally. MR cervical spine dictated separately. Electronically Signed   By: Genia Del M.D.   On: 07/07/2015 16:34   Mr Cervical Spine Wo Contrast 07/07/2015  1. Disc osteophyte complex protrudes into the left neural foramen at C5-6 compressing the left C6 nerve. 2. No other significant abnormality. Electronically Signed   By: Lorriane Shire M.D.   On: 07/07/2015 16:58    EKG: Independently reviewed. Sinus rhythm HR 70  Assessment/Plan  Principal Problem:   Left sided numbness / TIA (transient ischemic attack) TIA work up initiated:  - Aspirin daily - CT head showed no acute intracranial findings - MRI cervical spine showed compression of left C6 nerve, possibly contributing to patient's symptoms - MRI brain showed no acute infarction or intracranial hemorrhage. She does have remote tiny posterior right centrum semiovale infarct - 2D ECHO - pending  - Carotid doppler - pending  - HgbA1c, Lipid panel - pending.  -  Therapy: PT/OT pending  Other Stroke Risk Factors : history of CVA - Appreciate neurology following  Active Problems:   Anxiety and depression - Resume Xanax every 8 hours as needed    Headache  - Continue imipramine  DVT prophylaxis: SCD's bilaterally  Code Status: full code Family Communication: no family at the bedside  Disposition Plan: admission to telemetry, observation for TI A work up [employer] called: Stroke team Admission status: Observation    Leisa Lenz MD [facility] Pager [phone]  If 7PM-7AM, please contact night-coverage [email or web address] Password [email or web address]  07/07/2015, 5:11 PM

## 2015-07-07 NOTE — ED Notes (Addendum)
Pt reports intermittent headache with associated left arm numbness and left leg numbness which has been going on x 3 weeks. NAD at this time. Pt alert x4. Pt reports that numbness has stayed since 11:00 this morning. Spoke with MD Wilson Singer and he said to activate code stroke.

## 2015-07-07 NOTE — Code Documentation (Signed)
70yo female arriving to [facility] via private vehicle at 1345.  Patient with several week h/o left arm tingling.  Patient with h/o aneurysm coiling several years ago.  Code stroke called on patient arrival.  Patient to CT.  Stroke team to the bedside.  Patient to Trauma C.  NIHSS 0, see documentation for details and code stroke times.  Patient reports tingling has resolved by the time of exam.  No acute stroke treatment at this time.  Code stroke canceled per MD.  Bedside handoff with ED RN Suezanne Jacquet.

## 2015-07-07 NOTE — Progress Notes (Signed)
Pt arrived to 5C19. Alert and oriented x4. Husband at bedside. Pt placed on tele box 5C15. Bed alarm on and call bell within reach. Will continue to monitor.

## 2015-07-07 NOTE — ED Provider Notes (Signed)
CSN: [id number]     Arrival date & time 07/07/15  1345 History   First MD Initiated Contact with Patient 07/07/15 1406     Chief Complaint  Patient presents with  . Code Stroke    @EDPCLEARED @ (Consider location/radiation/quality/duration/timing/severity/associated sxs/prior Treatment) HPI 70 y.o. 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 She states that her symptoms are becoming more frequent. She states that her headache typically comes on after her symptoms began and lasted typically between a few minutes to a few hours. No history of recent head trauma. No history of migraines. She denies any associated visual changes, photophobia or phonophobia. She denies any associated weakness in her left hemibody. She states that she had an episode of left-sided numbness this morning similar to previous and given the increasing frequency she presents to the emergency department for further evaluation. On arrival the patient states that her left-sided numbness symptoms have resolved and currently she has no complaints. She Is not taking any medications for her symptoms. No history of prior CVA. She does take 81 mg aspirin daily.   Past Medical History  Diagnosis Date  . Depression   . Osteoarthritis   . Hyperlipidemia   . Anxiety   . Diverticula of colon   . IBS (irritable bowel syndrome)    Past Surgical History  Procedure Laterality Date  . Tonsillectomy    . Replacement total knee Right   . Cesarean section      X 2  . Rectal prolapse repair    . Colonoscopy     No family history on file. Social History  Substance Use Topics  . Smoking status: Former Smoker    Quit date: 03/15/1990  . Smokeless tobacco: Never Used  . Alcohol Use: 0.0  oz/week     Comment: occ..    OB History    No data available     Review of Systems  Constitutional: Positive for activity change. Negative for fever and chills.  HENT: Negative for congestion, rhinorrhea and sinus pressure.   Respiratory: Negative for cough, chest tightness and shortness of breath.   Cardiovascular: Negative for chest pain.  Gastrointestinal: Negative for nausea, vomiting, abdominal pain and diarrhea.  Genitourinary: Negative for dysuria and difficulty urinating.  Musculoskeletal: Positive for back pain (low back pain) and arthralgias (left shoulder arthritis). Negative for neck pain and neck stiffness.  Skin: Negative for rash and wound.  Neurological: Positive for numbness (left hemibody) and headaches. Negative for dizziness, seizures, syncope, facial asymmetry, speech difficulty, weakness and light-headedness.  Psychiatric/Behavioral: Negative for confusion.  All other systems reviewed and are negative.     Allergies  Cortisone and Penicillins  Home Medications   Prior to Admission medications   Medication Sig Start Date End Date Taking? Authorizing Provider  ALPRAZolam Duanne Moron) 1 MG tablet TAKE 1 TABLET BY MOUTH THREE TIMES DAILY AS NEEDED 12/24/10   Hoover Browns., MD  aspirin 81 MG tablet Take 81 mg by mouth daily.      Historical Provider, MD  estradiol (ESTRACE) 0.5 MG tablet Take 1 tablet by mouth 2 (two) times a week. 01/27/15   Historical Provider, MD  HYDROcodone-acetaminophen (NORCO) 10-325 MG tablet Take 1 tablet by mouth every 6 (six) hours as needed for moderate pain or severe  pain.    Historical Provider, MD  HYDROcodone-acetaminophen (NORCO/VICODIN) 5-325 MG tablet Take 1-2 tablets by mouth every 4 (four) hours as needed for moderate pain. 03/17/15   Kristeen Miss, MD  imipramine (TOFRANIL) 50 MG tablet TAKE 3 TABLETS AT BEDTIME 06/08/10   Hoover Browns., MD   BP 153/86 mmHg  Pulse 68  Temp(Src) 97.8 F (36.6 C)  (Axillary)  Resp 16  Ht 4' 11.84" (1.52 m)  Wt 54.2 kg  BMI 23.46 kg/m2  SpO2 98% Physical Exam  Constitutional: She is oriented to person, place, and time. She appears well-developed and well-nourished. No distress.  HENT:  Head: Normocephalic and atraumatic.  Nose: Nose normal.  Mouth/Throat: Oropharynx is clear and moist.  Eyes: Conjunctivae and EOM are normal. Pupils are equal, round, and reactive to light.  Neck: Neck supple.  Cardiovascular: Normal rate, regular rhythm, normal heart sounds and intact distal pulses.   Pulmonary/Chest: Effort normal and breath sounds normal. She exhibits no tenderness.  Abdominal: Soft. She exhibits no distension. There is no tenderness.  Musculoskeletal: She exhibits no edema or tenderness.  Neurological: She is alert and oriented to person, place, and time. She has normal strength. No cranial nerve deficit or sensory deficit. Coordination and gait normal. GCS eye subscore is 4. GCS verbal subscore is 5. GCS motor subscore is 6.  Reflex Scores:      Tricep reflexes are 2+ on the right side and 2+ on the left side.      Bicep reflexes are 2+ on the right side and 2+ on the left side.      Brachioradialis reflexes are 2+ on the right side and 2+ on the left side.      Patellar reflexes are 2+ on the right side and 2+ on the left side.      Achilles reflexes are 2+ on the right side and 2+ on the left side. Intact finger to nose  Skin: Skin is warm and dry. No rash noted. She is not diaphoretic.  Nursing note and vitals reviewed.   ED Course  Procedures (including critical care time) Labs Review Labs Reviewed  COMPREHENSIVE METABOLIC PANEL - Abnormal; Notable for the following:    Glucose, Bld 157 (*)    All other components within normal limits  I-STAT CHEM 8, ED - Abnormal; Notable for the following:    Chloride 100 (*)    Glucose, Bld 152 (*)    All other components within normal limits  PROTIME-INR  APTT  CBC  DIFFERENTIAL  I-STAT  TROPOININ, ED  CBG MONITORING, ED    Imaging Review Ct Head Wo Contrast  07/07/2015  CLINICAL DATA:  Code stroke. Left-sided weakness, progressively worsening. EXAM: CT HEAD WITHOUT CONTRAST TECHNIQUE: Contiguous axial images were obtained from the base of the skull through the vertex without intravenous contrast. 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 This is unchanged since 2010. No acute intracranial abnormality. Specifically, no hemorrhage, hydrocephalus, mass lesion, acute infarction, or significant intracranial injury. No acute calvarial abnormality. Visualized paranasal sinuses and mastoids clear. Orbital soft tissues unremarkable. IMPRESSION: No acute intracranial abnormality. Critical Value/emergent results were called by telephone at the time of interpretation on 07/07/2015 at 2:24 pm to Dr. Leonel Ramsay, who verbally acknowledged these results. Electronically Signed   By: Rolm Baptise M.D.   On: 07/07/2015 14:26   Mr Brain Wo Contrast  07/07/2015  CLINICAL DATA:  70 year old female with hyperlipidemia presenting with left-sided tingling  off and on for the past 3 weeks. Headache with flashing lights. Symptoms have resolved. 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 Subsequent encounter. EXAM: MRI HEAD WITHOUT CONTRAST TECHNIQUE: Multiplanar, multiecho pulse sequences of the brain and surrounding structures were obtained without intravenous contrast. COMPARISON:  07/07/2015 head CT. 08/02/2006 cerebral angiogram. 09/03/2004 and 09/02/2004 MR. FINDINGS: No acute infarct or intracranial hemorrhage. Post endovascular treatment of right carotid aneurysm. This is not adequately assessed on the current exam. 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 Major intracranial vascular structures appear patent. Remote tiny left caudate head infarct. Remote tiny posterior right centrum semiovale  infarct. Mild chronic microvascular changes. Mild global atrophy without hydrocephalus. No intracranial mass lesion noted on this unenhanced exam. Mild mucosal thickening right sphenoid sinus with minimal mucosal thickening ethmoid sinus air cells bilaterally. Cervical medullary junction unremarkable. IMPRESSION: No acute infarct or intracranial hemorrhage. Post endovascular treatment of right carotid aneurysm. This is not adequately assessed on the current exam. 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 Remote tiny posterior right centrum semiovale infarct. Mild chronic microvascular changes. Mild global atrophy without hydrocephalus. Mild mucosal thickening right sphenoid sinus with minimal mucosal thickening ethmoid sinus air cells bilaterally. MR cervical spine dictated separately. Electronically Signed   By: Genia Del M.D.   On: 07/07/2015 16:34   Mr Cervical Spine Wo Contrast  07/07/2015  CLINICAL DATA:  Intermittent left shoulder numbness extending down her left arm and leg. EXAM: MRI CERVICAL SPINE WITHOUT CONTRAST TECHNIQUE: Multiplanar, multisequence MR imaging of the cervical spine was performed. No intravenous contrast was administered. COMPARISON:  None. FINDINGS: The cervical spinal cord appears normal with no mass lesion or myelopathy or spinal cord compression. Paraspinal soft tissues are normal. Craniocervical junction through C2-3:  Normal. C3-4: Tiny uncinate spurs at the level of the right lateral recess with no neural impingement. Otherwise normal. C4-5:  Normal disc.  Slight right facet arthritis. C5-6: Disc space narrowing with 1 mm retrolisthesis with a prominent disc osteophyte complex protruding into the left neural foramen which should affect the left C6 nerve. Tiny uncinate spurs to the right with no neural impingement. C6-7: Tiny broad-based disc bulge with no neural impingement. Widely patent neural foramina. C7-T1 on  through T3-4:  No significant abnormality. IMPRESSION: 1. Disc osteophyte complex protrudes into the left neural foramen at C5-6 compressing the left C6 nerve. 2. No other significant abnormality. Electronically Signed   By: Lorriane Shire M.D.   On: 07/07/2015 16:58   I have personally reviewed and evaluated these images and lab results as part of my medical decision-making.   EKG Interpretation   Date/Time:  Monday July 07 2015 14:44:14 EDT Ventricular Rate:  69 PR Interval:  152 QRS Duration: 86 QT Interval:  444 QTC Calculation: 476 R Axis:   31 Text Interpretation:  Sinus rhythm Low voltage, precordial leads No  significant change since last tracing Confirmed by Wilson Singer  MD, [place]  ([id number]) on 07/07/2015 3:05:17 PM      MDM  70 y.o. female with a hx of DDD and recent surgery in her lumbar spine for radiculopathy Presents to the emergency department noting a several week history of intermittent left-sided numbness in her left upper extremity and left lower extremity accompanied by intermittent right-sided headaches. No history of migraines nor head trauma. Physical exam reassuring, neurologically intact. She was placed in the code stroke pathway initially and was evaluated at  the bedside in concert with our neurology consultants. CT head showed no acute abnormality. Code stroke labs returned reassuringly with no significant abnormalities noted. Given surgical history the decision was made to perform MR brain as well as MR cervical spine to evaluate for cervical stenosis which may be precipitating her symptoms. While awaiting MRI results the patient was admitted to the hospitalist service for TIA obs. This plan was discussed with the patient and her husband at the bedside and she stated both understanding and agreement.   Final diagnoses:  Transient cerebral ischemia, unspecified transient cerebral ischemia type  Right-sided headache  Numbness and tingling in left upper extremity   Numbness in left leg       Zenovia Jarred, DO 07/07/15 1730  Virgel Manifold, MD 07/13/15 2124

## 2015-07-08 ENCOUNTER — Observation Stay (HOSPITAL_COMMUNITY): Payer: PPO

## 2015-07-08 ENCOUNTER — Ambulatory Visit (HOSPITAL_COMMUNITY): Payer: Medicare Other

## 2015-07-08 DIAGNOSIS — G44219 Episodic tension-type headache, not intractable: Secondary | ICD-10-CM | POA: Diagnosis not present

## 2015-07-08 DIAGNOSIS — I6521 Occlusion and stenosis of right carotid artery: Secondary | ICD-10-CM | POA: Diagnosis not present

## 2015-07-08 DIAGNOSIS — G44229 Chronic tension-type headache, not intractable: Secondary | ICD-10-CM

## 2015-07-08 DIAGNOSIS — R200 Anesthesia of skin: Secondary | ICD-10-CM | POA: Diagnosis not present

## 2015-07-08 DIAGNOSIS — F418 Other specified anxiety disorders: Secondary | ICD-10-CM | POA: Diagnosis not present

## 2015-07-08 DIAGNOSIS — G459 Transient cerebral ischemic attack, unspecified: Secondary | ICD-10-CM | POA: Diagnosis not present

## 2015-07-08 LAB — LIPID PANEL
CHOL/HDL RATIO: 3.5 ratio
Cholesterol: 177 mg/dL (ref 0–200)
HDL: 51 mg/dL (ref >40)
LDL Cholesterol: 107 mg/dL — ABNORMAL HIGH (ref 0–99)
TRIGLYCERIDES: 97 mg/dL (ref <150)
VLDL: 19 mg/dL (ref 0–40)

## 2015-07-08 LAB — FOLATE: Folate: 21.2 ng/mL (ref >5.9)

## 2015-07-08 LAB — VITAMIN B12: Vitamin B-12: 323 pg/mL (ref 180–914)

## 2015-07-08 LAB — TSH: TSH: 3.116 u[IU]/mL (ref 0.350–4.500)

## 2015-07-08 MED ORDER — IOPAMIDOL (ISOVUE-370) INJECTION 76%
INTRAVENOUS | Status: AC
  Administered 2015-07-08: 50 mL
  Filled 2015-07-08: qty 50

## 2015-07-08 NOTE — Discharge Summary (Signed)
Physician Discharge Summary  SHERON TALLMAN QVZ:[id number] DOB: 21-Jan-1946 DOA: 07/07/2015  PCP: Donnie Coffin, MD  Admit date: 07/07/2015 Discharge date: 07/08/2015  Recommendations for Outpatient Follow-up:  1. No changes in medications on discharge  Discharge Diagnoses:  Principal Problem:   Left sided numbness Active Problems:   TIA (transient ischemic attack)   Anxiety and depression   Headache  Discharge Condition: stable; Patient declined to have echocardiogram, EEG, carotid Doppler.  Diet recommendation: as tolerated   History of present illness:   Per HPI "70 y.o. female with past medical history significant for headaches, anxiety and depression. Patient presented to [facility] with complaints of ongoing left side numbness, tingling, arm and leg for past few weeks prior to this admission. Patient reports symptoms are more prominent when she is walking and not while she is at rest. Her symptoms can last for short period of time versus more than an hour depending on how long she is walking. Symptoms spontaneously resolve. No specific aggravating factors. No reports of sensation loss. No reports of weakness. No reports of falls. Patient has no associated slurred speech, no facial asymmetry. No reports of chest pain or shortness of breath or palpitations.  ED Course:  In ED, patient was hemodynamically stable. CT head and neck showed no acute intracranial findings. MRI of the cervical spine shows disc osteophyte complex protruding into the left neural foramen at C5-6 compressing the left C6 nerve otherwise no other significant abnormalities. Patient was seen by neurology in consultation. She is admitted for TIA workup."   Hospital Course:   Assessment/Plan  Principal Problem:  Left sided numbness / TIA (transient ischemic attack) TIA work up initiated:  - Aspirin daily - CT head showed no acute intracranial findings - MRI cervical spine showed compression of left  C6 nerve, possibly contributing to patient's symptoms - MRI brain showed no acute infarction or intracranial hemorrhage. She does have remote tiny posterior right centrum semiovale infarct - 2D ECHO - pt declined to have it  - Carotid doppler - pt declined to have it  - HgbA1c- pending, can be followed on outpt basis, Lipid panel - LDL 107, acceptable - Therapy: PT/OT - pt declined to have evaluation  - Other Stroke Risk Factors : history of CVA - Per neurology, patient okay for discharge and outpatient follow-up with primary care and neurosurgery  Active Problems:  Anxiety and depression - Continue Xanax per hopme dose    Headache - Continue imipramine  DVT prophylaxis: SCD's bilaterally  Code Status: full code Family Communication: no family at the bedside  Disposition Plan: admission to telemetry, observation for TI A work up [employer] called: Stroke team Admission status: Observation     Signed:  Leisa Lenz, MD  [facility] 07/08/2015, 12:04 PM  Pager #: [phone]  Time spent in minutes: more than 30 minutes   Discharge Exam: Filed Vitals:   07/08/15 0500 07/08/15 0913  BP: 120/74 141/70  Pulse: 78 75  Temp: 97.7 F (36.5 C) 97.6 F (36.4 C)  Resp: 18 20   Filed Vitals:   07/08/15 0100 07/08/15 0300 07/08/15 0500 07/08/15 0913  BP: 130/70 129/67 120/74 141/70  Pulse: 72 77 78 75  Temp:   97.7 F (36.5 C) 97.6 F (36.4 C)  TempSrc:   Oral Oral  Resp: 18 18 18 20   Height:      Weight:      SpO2: 95% 92% 93% 99%    General: Pt is alert, follows  commands appropriately, not in acute distress Cardiovascular: Regular rate and rhythm, S1/S2 +, no murmurs Respiratory: Clear to auscultation bilaterally, no wheezing, no crackles, no rhonchi Abdominal: Soft, non tender, non distended, bowel sounds +, no guarding Extremities: no edema, no cyanosis, pulses palpable bilaterally DP and PT Neuro: Grossly nonfocal  Discharge Instructions  Discharge  Instructions    Call MD for:  difficulty breathing, headache or visual disturbances    Complete by:  As directed      Call MD for:  persistant dizziness or light-headedness    Complete by:  As directed      Call MD for:  persistant nausea and vomiting    Complete by:  As directed      Call MD for:  severe uncontrolled pain    Complete by:  As directed      Diet - low sodium heart healthy    Complete by:  As directed      Increase activity slowly    Complete by:  As directed             Medication List    TAKE these medications        ALPRAZolam 1 MG tablet  Commonly known as:  XANAX  TAKE 1 TABLET BY MOUTH THREE TIMES DAILY AS NEEDED     aspirin 81 MG tablet  Take 81 mg by mouth daily.     HYDROcodone-acetaminophen 5-325 MG tablet  Commonly known as:  NORCO/VICODIN  Take 1-2 tablets by mouth every 4 (four) hours as needed for moderate pain.     imipramine 50 MG tablet  Commonly known as:  TOFRANIL  TAKE 3 TABLETS AT BEDTIME           Follow-up Information    Follow up with Donnie Coffin, MD. Schedule an appointment as soon as possible for a visit in 1 week.   Specialty:  Family Medicine   Why:  Follow up appt after recent hospitalization   Contact information:   [address] [place] [address] [phone]        The results of significant diagnostics from this hospitalization (including imaging, microbiology, ancillary and laboratory) are listed below for reference.    Significant Diagnostic Studies: Ct Angio Head W/cm &/or Wo Cm  07/08/2015  CLINICAL DATA:  70 year old female with intermittent left side tingling for 3 weeks. Headache and visual changes. 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 Initial encounter. EXAM: CT ANGIOGRAPHY HEAD AND NECK TECHNIQUE: Multidetector CT imaging of the head and neck was performed using the standard protocol during bolus administration of intravenous  contrast. Multiplanar CT image reconstructions and MIPs were obtained to evaluate the vascular anatomy. Carotid stenosis measurements (when applicable) are obtained utilizing NASCET criteria, using the distal internal carotid diameter as the denominator. CONTRAST:  50 mL Isovue 370 COMPARISON:  Brain MRI 07/07/2015. Post treatment cerebral angiogram 08/02/2006. FINDINGS: CTA NECK Skeleton: No acute osseous abnormality identified. Incidental torus mandibularis. Minimal paranasal sinus mucosal thickening. Mastoids and tympanic cavities are clear. Other neck: Mild to moderate centrilobular emphysema. No superior mediastinal Lymphadenopathy.Negative thyroid, larynx, pharynx, parapharyngeal spaces, retropharyngeal space, sublingual space, submandibular glands and parotid glands. No cervical lymphadenopathy Aortic arch: 4 vessel arch configuration, diminutive left vertebral artery arises directly from the arch. No significant arch atherosclerosis. Right carotid system: Mild brachiocephalic artery calcified plaque but no stenosis. Negative right CCA and cervical right ICA aside from mild tortuosity. Left carotid system: Normal left CCA  origin. Negative left CCA and cervical left ICA aside from mild tortuosity. Vertebral arteries: No proximal right subclavian artery stenosis. Dominant right vertebral artery. Normal right vertebral artery origin. Mildly tortuous right V1 segment. The right vertebral remains dominant and normal to the skullbase. Non dominant left vertebral artery is diminutive and arises directly from the aortic arch. Is straw and by thyrocervical trunk branches at C5-C6, with subsequent increased caliber. A remains patent to the skullbase. CTA HEAD Posterior circulation: No distal vertebral artery stenosis. Dominant right vertebral artery. Normal right PICA origin. Dominant left AICA origin is normal. Normal vertebrobasilar junction. The basilar artery is mildly irregular but patent without stenosis. SCA  origins are patent. Fetal type bilateral PCA origins, more so the right. Bilateral PCA branches are mildly irregular without significant stenosis. Anterior circulation: Both ICA siphons are patent. No siphon stenosis. Right ICA siphon vascular stent is patent and present from the cavernous to the supraclinoid segment. Dense coil pack in the region of the right ICA terminus. No superimposed aneurysmal enhancement is identified. The right posterior communicating artery origin is obscured, but the vessel remains patent. Normal left posterior communicating artery. Normal carotid termini, MCA and ACA origins. The anterior communicating artery is mildly irregular but otherwise normal. Bilateral ACA branches are normal. Left MCA M1 segment, bifurcation, and left MCA branches are normal. Right MCA M1 segment, bifurcation, and right MCA branches are normal. Both MCA systems appear somewhat hyperplastic compared to the PCA vasculature. Venous sinuses: Patent. Anatomic variants: Dominant right vertebral artery. Non dominant left vertebral artery arises directly from the arch, and is quite diminutive until joined by thyrocervical branches at the C5-C6 level. Bilateral fetal type PCA origins. Delayed phase: No abnormal enhancement identified. Stable and negative CT appearance of the brain. IMPRESSION: 1. Negative for emergent large vessel occlusion. 2. 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 3. Evidence of intracranial atherosclerosis with mild irregularity of the basilar artery, bilateral PCAs, and anterior communicating artery, but no significant intracranial stenosis. 4. Normal arterial findings in the neck aside from mild tortuosity. 5. Stable and negative CT appearance of the brain. 6. Centrilobular emphysema. Electronically Signed   By: Genevie Ann M.D.   On: 07/08/2015 07:22   Ct Head Wo Contrast  07/07/2015  CLINICAL DATA:  Code stroke. Left-sided weakness, progressively worsening.  EXAM: CT HEAD WITHOUT CONTRAST TECHNIQUE: Contiguous axial images were obtained from the base of the skull through the vertex without intravenous contrast. 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 This is unchanged since 2010. No acute intracranial abnormality. Specifically, no hemorrhage, hydrocephalus, mass lesion, acute infarction, or significant intracranial injury. No acute calvarial abnormality. Visualized paranasal sinuses and mastoids clear. Orbital soft tissues unremarkable. IMPRESSION: No acute intracranial abnormality. Critical Value/emergent results were called by telephone at the time of interpretation on 07/07/2015 at 2:24 pm to Dr. Leonel Ramsay, who verbally acknowledged these results. Electronically Signed   By: Rolm Baptise M.D.   On: 07/07/2015 14:26   Ct Angio Neck W/cm &/or Wo/cm  07/08/2015  CLINICAL DATA:  70 year old female with intermittent left side tingling for 3 weeks. Headache and visual changes. 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 Initial encounter. EXAM: CT ANGIOGRAPHY HEAD AND NECK TECHNIQUE: Multidetector CT imaging of the head and neck was performed using the standard protocol during bolus administration of intravenous contrast. Multiplanar CT image reconstructions and MIPs were obtained to evaluate the vascular anatomy.  Carotid stenosis measurements (when applicable) are obtained utilizing NASCET criteria, using the distal internal carotid diameter as the denominator. CONTRAST:  50 mL Isovue 370 COMPARISON:  Brain MRI 07/07/2015. Post treatment cerebral angiogram 08/02/2006. FINDINGS: CTA NECK Skeleton: No acute osseous abnormality identified. Incidental torus mandibularis. Minimal paranasal sinus mucosal thickening. Mastoids and tympanic cavities are clear. Other neck: Mild to moderate centrilobular emphysema. No superior mediastinal Lymphadenopathy.Negative thyroid, larynx,  pharynx, parapharyngeal spaces, retropharyngeal space, sublingual space, submandibular glands and parotid glands. No cervical lymphadenopathy Aortic arch: 4 vessel arch configuration, diminutive left vertebral artery arises directly from the arch. No significant arch atherosclerosis. Right carotid system: Mild brachiocephalic artery calcified plaque but no stenosis. Negative right CCA and cervical right ICA aside from mild tortuosity. Left carotid system: Normal left CCA origin. Negative left CCA and cervical left ICA aside from mild tortuosity. Vertebral arteries: No proximal right subclavian artery stenosis. Dominant right vertebral artery. Normal right vertebral artery origin. Mildly tortuous right V1 segment. The right vertebral remains dominant and normal to the skullbase. Non dominant left vertebral artery is diminutive and arises directly from the aortic arch. Is straw and by thyrocervical trunk branches at C5-C6, with subsequent increased caliber. A remains patent to the skullbase. CTA HEAD Posterior circulation: No distal vertebral artery stenosis. Dominant right vertebral artery. Normal right PICA origin. Dominant left AICA origin is normal. Normal vertebrobasilar junction. The basilar artery is mildly irregular but patent without stenosis. SCA origins are patent. Fetal type bilateral PCA origins, more so the right. Bilateral PCA branches are mildly irregular without significant stenosis. Anterior circulation: Both ICA siphons are patent. No siphon stenosis. Right ICA siphon vascular stent is patent and present from the cavernous to the supraclinoid segment. Dense coil pack in the region of the right ICA terminus. No superimposed aneurysmal enhancement is identified. The right posterior communicating artery origin is obscured, but the vessel remains patent. Normal left posterior communicating artery. Normal carotid termini, MCA and ACA origins. The anterior communicating artery is mildly irregular but  otherwise normal. Bilateral ACA branches are normal. Left MCA M1 segment, bifurcation, and left MCA branches are normal. Right MCA M1 segment, bifurcation, and right MCA branches are normal. Both MCA systems appear somewhat hyperplastic compared to the PCA vasculature. Venous sinuses: Patent. Anatomic variants: Dominant right vertebral artery. Non dominant left vertebral artery arises directly from the arch, and is quite diminutive until joined by thyrocervical branches at the C5-C6 level. Bilateral fetal type PCA origins. Delayed phase: No abnormal enhancement identified. Stable and negative CT appearance of the brain. IMPRESSION: 1. Negative for emergent large vessel occlusion. 2. 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 3. Evidence of intracranial atherosclerosis with mild irregularity of the basilar artery, bilateral PCAs, and anterior communicating artery, but no significant intracranial stenosis. 4. Normal arterial findings in the neck aside from mild tortuosity. 5. Stable and negative CT appearance of the brain. 6. Centrilobular emphysema. Electronically Signed   By: Genevie Ann M.D.   On: 07/08/2015 07:22   Mr Brain Wo Contrast  07/07/2015  CLINICAL DATA:  70 year old female with hyperlipidemia presenting with left-sided tingling off and on for the past 3 weeks. Headache with flashing lights. Symptoms have resolved. 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 Subsequent encounter. EXAM: MRI HEAD WITHOUT CONTRAST TECHNIQUE: Multiplanar, multiecho pulse sequences of the brain and surrounding structures were obtained without intravenous contrast. COMPARISON:  07/07/2015 head CT. 08/02/2006 cerebral angiogram. 09/03/2004 and 09/02/2004 MR. FINDINGS: No  acute infarct or intracranial hemorrhage. Post endovascular treatment of right carotid aneurysm. This is not adequately assessed on the current exam. 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 Major intracranial vascular structures appear patent. Remote tiny left caudate head infarct. Remote tiny posterior right centrum semiovale infarct. Mild chronic microvascular changes. Mild global atrophy without hydrocephalus. No intracranial mass lesion noted on this unenhanced exam. Mild mucosal thickening right sphenoid sinus with minimal mucosal thickening ethmoid sinus air cells bilaterally. Cervical medullary junction unremarkable. IMPRESSION: No acute infarct or intracranial hemorrhage. Post endovascular treatment of right carotid aneurysm. This is not adequately assessed on the current exam. 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 Remote tiny posterior right centrum semiovale infarct. Mild chronic microvascular changes. Mild global atrophy without hydrocephalus. Mild mucosal thickening right sphenoid sinus with minimal mucosal thickening ethmoid sinus air cells bilaterally. MR cervical spine dictated separately. Electronically Signed   By: Genia Del M.D.   On: 07/07/2015 16:34   Mr Cervical Spine Wo Contrast  07/07/2015  CLINICAL DATA:  Intermittent left shoulder numbness extending down her left arm and leg. EXAM: MRI CERVICAL SPINE WITHOUT CONTRAST TECHNIQUE: Multiplanar, multisequence MR imaging of the cervical spine was performed. No intravenous contrast was administered. COMPARISON:  None. FINDINGS: The cervical spinal cord appears normal with no mass lesion or myelopathy or spinal cord compression. Paraspinal soft tissues are normal. Craniocervical junction through C2-3:  Normal. C3-4: Tiny uncinate spurs at the level of the right lateral recess with no neural impingement. Otherwise normal. C4-5:  Normal disc.  Slight right facet arthritis. C5-6: Disc space narrowing with 1 mm retrolisthesis with a prominent disc osteophyte complex protruding into the left neural foramen which should affect the left C6  nerve. Tiny uncinate spurs to the right with no neural impingement. C6-7: Tiny broad-based disc bulge with no neural impingement. Widely patent neural foramina. C7-T1 on through T3-4:  No significant abnormality. IMPRESSION: 1. Disc osteophyte complex protrudes into the left neural foramen at C5-6 compressing the left C6 nerve. 2. No other significant abnormality. Electronically Signed   By: Lorriane Shire M.D.   On: 07/07/2015 16:58    Microbiology: No results found for this or any previous visit (from the past 240 hour(s)).   Labs: Basic Metabolic Panel:  Recent Labs Lab 07/07/15 1414 07/07/15 1415  NA 141 140  K 3.8 3.7  CL 103 100*  CO2 28  --   GLUCOSE 157* 152*  BUN 10 12  CREATININE 0.67 0.60  CALCIUM 9.1  --    Liver Function Tests:  Recent Labs Lab 07/07/15 1414  AST 25  ALT 18  ALKPHOS 58  BILITOT 0.3  PROT 6.5  ALBUMIN 3.8   No results for input(s): LIPASE, AMYLASE in the last 168 hours. No results for input(s): AMMONIA in the last 168 hours. CBC:  Recent Labs Lab 07/07/15 1414 07/07/15 1415  WBC 7.7  --   NEUTROABS 4.2  --   HGB 12.7 14.3  HCT 39.0 42.0  MCV 90.3  --   PLT 266  --    Cardiac Enzymes: No results for input(s): CKTOTAL, CKMB, CKMBINDEX, TROPONINI in the last 168 hours. BNP: BNP (last 3 results) No results for input(s): BNP in the last 8760 hours.  ProBNP (last 3 results) No results for input(s): PROBNP in the last 8760 hours.  CBG: No results for input(s): GLUCAP in the last 168 hours.

## 2015-07-08 NOTE — Progress Notes (Signed)
STROKE TEAM PROGRESS NOTE   HISTORY OF PRESENT ILLNESS Cassandra Alexander is an 70 y.o. female who has been having left sided tingling which is off and on for the past three weeks. She states she has had HA and has had flashing lights in her vision before but not consistent. Over the past three weeks her left shoulder will go numb and in the course of 1 minute it will spread down her arm into her left leg. She has not been paying much attention to this but today her husband wanted her to be seen in ED. Currently her symptoms have resolved. She is very anxious and states the symptoms can occur when she is anxious. She has no facial involvement. She has had a hysterectomy but still has her ovaries. She is unsure about onset of menopause. Her last known well was unknown. Patient was not administered IV t-PA secondary to unknown last known well. She was admitted for further evaluation and treatment.   SUBJECTIVE (INTERVAL HISTORY) No family is at the bedside. She had EEG canceled. She reports feeling anxious but symptoms all resolved. She is asking to be discharged soon and can not wait any longer.    OBJECTIVE Temp:  [97.6 F (36.4 C)-98.1 F (36.7 C)] 97.6 F (36.4 C) (04/25 0913) Pulse Rate:  [66-80] 75 (04/25 0913) Cardiac Rhythm:  [-] Normal sinus rhythm (04/25 0703) Resp:  [14-20] 20 (04/25 0913) BP: (120-158)/(67-88) 141/70 mmHg (04/25 0913) SpO2:  [92 %-99 %] 99 % (04/25 0913) Weight:  [54.2 kg (119 lb 7.8 oz)-56.444 kg (124 lb 7 oz)] 56.444 kg (124 lb 7 oz) (04/24 2042)  CBC:   Recent Labs Lab 07/07/15 1414 07/07/15 1415  WBC 7.7  --   NEUTROABS 4.2  --   HGB 12.7 14.3  HCT 39.0 42.0  MCV 90.3  --   PLT 266  --     Basic Metabolic Panel:   Recent Labs Lab 07/07/15 1414 07/07/15 1415  NA 141 140  K 3.8 3.7  CL 103 100*  CO2 28  --   GLUCOSE 157* 152*  BUN 10 12  CREATININE 0.67 0.60  CALCIUM 9.1  --     Lipid Panel:     Component Value Date/Time   CHOL 177  07/08/2015 0444   TRIG 97 07/08/2015 0444   TRIG 67 01/26/2006 1026   HDL 51 07/08/2015 0444   CHOLHDL 3.5 07/08/2015 0444   CHOLHDL 2.6 CALC 01/26/2006 1026   VLDL 19 07/08/2015 0444   LDLCALC 107* 07/08/2015 0444   HgbA1c: No results found for: HGBA1C Urine Drug Screen: No results found for: LABOPIA, COCAINSCRNUR, LABBENZ, AMPHETMU, THCU, LABBARB    IMAGING I have personally reviewed the radiological images below and agree with the radiology interpretations.  Ct Head Wo Contrast 07/07/2015   No acute intracranial abnormality.  Ct Angio Head & Neck W/cm &/or Wo/cm 07/08/2015   1. Negative for emergent large vessel occlusion. 2. 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 3. Evidence of intracranial atherosclerosis with mild irregularity of the basilar artery, bilateral PCAs, and anterior communicating artery, but no significant intracranial stenosis. 4. Normal arterial findings in the neck aside from mild tortuosity. 5. Stable and negative CT appearance of the brain. 6. Centrilobular emphysema.   Mr Brain Wo Contrast 07/07/2015  No acute infarct or intracranial hemorrhage. Post endovascular treatment of right carotid aneurysm. This is not adequately assessed on the current exam. 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 Remote tiny posterior right centrum semiovale infarct. Mild chronic microvascular changes. Mild global atrophy without hydrocephalus. Mild mucosal thickening right sphenoid sinus with minimal mucosal thickening ethmoid sinus air cells bilaterally. MR cervical spine dictated separately.   Mr Cervical Spine Wo Contrast 07/07/2015  1. Disc osteophyte complex protrudes into the left neural foramen at C5-6 compressing the left C6 nerve. 2. No other significant abnormality.    PHYSICAL EXAM  Temp:  [97.6 F (36.4 C)] 97.6 F (36.4 C) (04/25 0913) Pulse Rate:  [75] 75 (04/25  0913) Resp:  [20] 20 (04/25 0913) BP: (141)/(70) 141/70 mmHg (04/25 0913) SpO2:  [99 %] 99 % (04/25 0913)  General - Well nourished, well developed, in no apparent distress, but anxious, asking to go home.  Ophthalmologic - Sharp disc margins OU.   Cardiovascular - Regular rate and rhythm with no murmur.  Mental Status -  Level of arousal and orientation to time, place, and person were intact. Language including expression, naming, repetition, comprehension was assessed and found intact. Fund of Knowledge was assessed and was intact.  Cranial Nerves II - XII - II - Visual field intact OU. III, IV, VI - Extraocular movements intact. V - Facial sensation intact bilaterally. VII - Facial movement intact bilaterally. VIII - Hearing & vestibular intact bilaterally. X - Palate elevates symmetrically. XI - Chin turning & shoulder shrug intact bilaterally. XII - Tongue protrusion intact.  Motor Strength - The patient's strength was normal in all extremities and pronator drift was absent.  Bulk was normal and fasciculations were absent.   Motor Tone - Muscle tone was assessed at the neck and appendages and was normal.  Reflexes - The patient's reflexes were 1+ in all extremities and she had no pathological reflexes.  Sensory - Light touch, temperature/pinprick were assessed and were symmetrical.    Coordination - The patient had normal movements in the hands and feet with no ataxia or dysmetria.  Tremor was absent.  Gait and Station - The patient's transfers, posture, gait, station, and turns were observed as normal   ASSESSMENT/PLAN Cassandra Alexander is a 70 y.o. female with history of headaches, anxiety and depression presenting with L sided tingling involving arm and leg with HA. She did not receive IV t-PA due to unknown last known well.   Complicated Migraine vs Anxiety State  MRI head  No acute stroke  MRI cervical spine osteophyte compressing L C6 nerve  CTA head & neck   No emergent large vessel stenosis, aneurysm coiling secure  2D Echo  cancelled   EEG canceled. Recommend completion work up to rule out seizure but she does not want to wait.   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 Annette Stable  Anxiety and depression  [facility] day # 1  Neurology will sign off. Please  call with questions. No neuro follow up needed at this time. Thanks for the consult.  Rosalin Hawking, MD PhD Stroke Neurology 07/09/2015 6:32 AM     To contact Stroke Continuity provider, please refer to [email or web address] After hours, contact General Neurology

## 2015-07-08 NOTE — Discharge Instructions (Signed)
Transient Ischemic Attack A transient ischemic attack (TIA) is a "warning stroke" that causes stroke-like symptoms. Unlike a stroke, a TIA does not cause permanent damage to the brain. The symptoms of a TIA can happen very fast and do not last long. It is important to know the symptoms of a TIA and what to do. This can help prevent a major stroke or death. CAUSES  A TIA is caused by a temporary blockage in an artery in the brain or neck (carotid artery). The blockage does not allow the brain to get the blood supply it needs and can cause different symptoms. The blockage can be caused by either:  A blood clot.  Fatty buildup (plaque) in a neck or brain artery. RISK FACTORS  High blood pressure (hypertension).  High cholesterol.  Diabetes mellitus.  Heart disease.  The buildup of plaque in the blood vessels (peripheral artery disease or atherosclerosis).  The buildup of plaque in the blood vessels that provide blood and oxygen to the brain (carotid artery stenosis).  An abnormal heart rhythm (atrial fibrillation).  Obesity.  Using any tobacco products, including cigarettes, chewing tobacco, or electronic cigarettes.  Taking oral contraceptives, especially in combination with using tobacco.  Physical inactivity.  A diet high in fats, salt (sodium), and calories.  Excessive alcohol use.  Use of illegal drugs (especially cocaine and methamphetamine).  Being female.  Being African American.  Being over the age of 53 years.  Family history of stroke.  Previous history of blood clots, stroke, TIA, or heart attack.  Sickle cell disease. SIGNS AND SYMPTOMS  TIA symptoms are the same as a stroke but are temporary. These symptoms usually develop suddenly, or may be newly present upon waking from sleep:  Sudden weakness or numbness of the face, arm, or leg, especially on one side of the body.  Sudden trouble walking or difficulty moving arms or legs.  Sudden  confusion.  Sudden personality changes.  Trouble speaking (aphasia) or understanding.  Difficulty swallowing.  Sudden trouble seeing in one or both eyes.  Double vision.  Dizziness.  Loss of balance or coordination.  Sudden severe headache with no known cause.  Trouble reading or writing.  Loss of bowel or bladder control.  Loss of consciousness. DIAGNOSIS  Your health care provider may be able to determine the presence or absence of a TIA based on your symptoms, history, and physical exam. CT scan of the brain is usually performed to help identify a TIA. Other tests may include:  Electrocardiography (ECG).  Continuous heart monitoring.  Echocardiography.  Carotid ultrasonography.  MRI.  A scan of the brain circulation.  Blood tests. TREATMENT  Since the symptoms of TIA are the same as a stroke, it is important to seek treatment as soon as possible. You may need a medicine to dissolve a blood clot (thrombolytic) if that is the cause of the TIA. This medicine cannot be given if too much time has passed. Treatment may also include:   Rest, oxygen, fluids through an IV tube, and medicines to thin the blood (anticoagulants).  Measures will be taken to prevent short-term and long-term complications, including infection from breathing foreign material into the lungs (aspiration pneumonia), blood clots in the legs, and falls.  Procedures to either remove plaque in the carotid arteries or dilate carotid arteries that have narrowed due to plaque. Those procedures are:  Carotid endarterectomy.  Carotid angioplasty and stenting.  Medicines and diet may be used to address diabetes, high blood pressure, and  other underlying risk factors. HOME CARE INSTRUCTIONS   Take medicines only as directed by your health care provider. Follow the directions carefully. Medicines may be used to control risk factors for a stroke. Be sure you understand all your medicine instructions.  You  may be told to take aspirin or the anticoagulant warfarin. Warfarin needs to be taken exactly as instructed.  Taking too much or too little warfarin is dangerous. Too much warfarin increases the risk of bleeding. Too little warfarin continues to allow the risk for blood clots. While taking warfarin, you will need to have regular blood tests to measure your blood clotting time. A PT blood test measures how long it takes for blood to clot. Your PT is used to calculate another value called an INR. Your PT and INR help your health care provider to adjust your dose of warfarin. The dose can change for many reasons. It is critically important that you take warfarin exactly as prescribed.  Many foods, especially foods high in vitamin K can interfere with warfarin and affect the PT and INR. Foods high in vitamin K include spinach, kale, broccoli, cabbage, collard and turnip greens, Brussels sprouts, peas, cauliflower, seaweed, and parsley, as well as beef and pork liver, green tea, and soybean oil. You should eat a consistent amount of foods high in vitamin K. Avoid major changes in your diet, or notify your health care provider before changing your diet. Arrange a visit with a dietitian to answer your questions.  Many medicines can interfere with warfarin and affect the PT and INR. You must tell your health care provider about any and all medicines you take; this includes all vitamins and supplements. Be especially cautious with aspirin and anti-inflammatory medicines. Do not take or discontinue any prescribed or over-the-counter medicine except on the advice of your health care provider or pharmacist.  Warfarin can have side effects, such as excessive bruising or bleeding. You will need to hold pressure over cuts for longer than usual. Your health care provider or pharmacist will discuss other potential side effects.  Avoid sports or activities that may cause injury or bleeding.  Be careful when shaving,  flossing your teeth, or handling sharp objects.  Alcohol can change the body's ability to handle warfarin. It is best to avoid alcoholic drinks or consume only very small amounts while taking warfarin. Notify your health care provider if you change your alcohol intake.  Notify your dentist or other health care providers before procedures.  Eat a diet that includes 5 or more servings of fruits and vegetables each day. This may reduce the risk of stroke. Certain diets may be prescribed to address high blood pressure, high cholesterol, diabetes, or obesity.  A diet low in sodium, saturated fat, trans fat, and cholesterol is recommended to manage high blood pressure.  A diet low in saturated fat, trans fat, and cholesterol, and high in fiber may control cholesterol levels.  A controlled-carbohydrate, controlled-sugar diet is recommended to manage diabetes.  A reduced-calorie diet that is low in sodium, saturated fat, trans fat, and cholesterol is recommended to manage obesity.  Maintain a healthy weight.  Stay physically active. It is recommended that you get at least 30 minutes of activity on most or all days.  Do not use any tobacco products, including cigarettes, chewing tobacco, or electronic cigarettes. If you need help quitting, ask your health care provider.  Limit alcohol intake to no more than 1 drink per day for nonpregnant women and 2 drinks  per day for men. One drink equals 12 ounces of beer, 5 ounces of wine, or 1 ounces of hard liquor.  Do not abuse drugs.  A safe home environment is important to reduce the risk of falls. Your health care provider may arrange for specialists to evaluate your home. Having grab bars in the bedroom and bathroom is often important. Your health care provider may arrange for equipment to be used at home, such as raised toilets and a seat for the shower.  Follow all instructions for follow-up with your health care provider. This is very important.  This includes any referrals and lab tests. Proper follow-up can prevent a stroke or another TIA from occurring. PREVENTION  The risk of a TIA can be decreased by appropriately treating high blood pressure, high cholesterol, diabetes, heart disease, and obesity, and by quitting smoking, limiting alcohol, and staying physically active. SEEK MEDICAL CARE IF:  You have personality changes.  You have difficulty swallowing.  You are seeing double.  You have dizziness.  You have a fever. SEEK IMMEDIATE MEDICAL CARE IF:  Any of the following symptoms may represent a serious problem that is an emergency. Do not wait to see if the symptoms will go away. Get medical help right away. Call your local emergency services (911 in [place] Do not drive yourself to the hospital.  You have sudden weakness or numbness of the face, arm, or leg, especially on one side of the body.  You have sudden trouble walking or difficulty moving arms or legs.  You have sudden confusion.  You have trouble speaking (aphasia) or understanding.  You have sudden trouble seeing in one or both eyes.  You have a loss of balance or coordination.  You have a sudden, severe headache with no known cause.  You have new chest pain or an irregular heartbeat.  You have a partial or total loss of consciousness. MAKE SURE YOU:   Understand these instructions.  Will watch your condition.  Will get help right away if you are not doing well or get worse.   This information is not intended to replace advice given to you by your health care provider. Make sure you discuss any questions you have with your health care provider.   Document Released: 12/09/2004 Document Revised: 03/22/2014 Document Reviewed: 06/06/2013 Elsevier Interactive Patient Education [employer].

## 2015-07-08 NOTE — Care Management Note (Signed)
Case Management Note  Patient Details  Name: Cassandra Alexander MRN: [id number] Date of Birth: 1945/03/17  Subjective/Objective:                    Action/Plan:  Patient presented with left-sided numbness and tingling. Lives at home with spouse. Will follow for discharge needs pending PT/OT evals and physician orders.  Expected Discharge Date:                  Expected Discharge Plan:     In-House Referral:     Discharge planning Services     Post Acute Care Choice:    Choice offered to:     DME Arranged:    DME Agency:     HH Arranged:    HH Agency:     Status of Service:  In process, will continue to follow  Medicare Important Message Given:    Date Medicare IM Given:    Medicare IM give by:    Date Additional Medicare IM Given:    Additional Medicare Important Message give by:     If discussed at [place] of Stay Meetings, dates discussed:    Additional Comments:  Rolm Baptise, RN 07/08/2015, 11:43 AM [phone]

## 2015-07-08 NOTE — Progress Notes (Addendum)
Pt d/c to home by car with family. Assessment stable. All questions answered. Pt refused to be taken downstairs by wheelchair

## 2015-07-08 NOTE — Progress Notes (Signed)
PT Cancellation Note  Patient Details Name: Cassandra Alexander MRN: [id number] DOB: December 02, 1945   Cancelled Treatment:    Reason Eval/Treat Not Completed: PT screened, no needs identified, will sign off; patient able to demonstrate independent mobility in the room and reports no deficits and ready for d/c home.  Discussed outpatient PT follow up for cervical and lower back symptoms.  Patient to check into insurance coverage reports had some outpatient PT but was out of network and states will check on what is in network for her and follow up with her neurosurgeion.   Reginia Naas 07/08/2015, 1:42 PM  [place], [place] 07/08/2015

## 2015-07-09 DIAGNOSIS — Z9889 Other specified postprocedural states: Secondary | ICD-10-CM

## 2015-07-09 DIAGNOSIS — Z8679 Personal history of other diseases of the circulatory system: Secondary | ICD-10-CM | POA: Insufficient documentation

## 2015-07-09 LAB — HEMOGLOBIN A1C
HEMOGLOBIN A1C: 6.1 % — AB (ref 4.8–5.6)
MEAN PLASMA GLUCOSE: 128 mg/dL

## 2015-07-15 ENCOUNTER — Other Ambulatory Visit (HOSPITAL_COMMUNITY): Payer: Self-pay | Admitting: Interventional Radiology

## 2015-07-15 DIAGNOSIS — R202 Paresthesia of skin: Secondary | ICD-10-CM

## 2015-07-15 DIAGNOSIS — R200 Anesthesia of skin: Secondary | ICD-10-CM

## 2015-07-18 ENCOUNTER — Ambulatory Visit (HOSPITAL_COMMUNITY)
Admission: RE | Admit: 2015-07-18 | Discharge: 2015-07-18 | Disposition: A | Discharge status: Home / Self Care | Payer: Medicare Other | Source: Ambulatory Visit | Attending: Interventional Radiology | Admitting: Interventional Radiology

## 2015-07-18 ENCOUNTER — Other Ambulatory Visit (HOSPITAL_COMMUNITY): Payer: Self-pay | Admitting: Interventional Radiology

## 2015-07-18 DIAGNOSIS — R200 Anesthesia of skin: Secondary | ICD-10-CM

## 2015-07-18 DIAGNOSIS — I671 Cerebral aneurysm, nonruptured: Secondary | ICD-10-CM | POA: Diagnosis not present

## 2015-07-18 DIAGNOSIS — R202 Paresthesia of skin: Principal | ICD-10-CM

## 2015-07-18 DIAGNOSIS — I663 Occlusion and stenosis of cerebellar arteries: Secondary | ICD-10-CM | POA: Diagnosis not present

## 2015-07-21 ENCOUNTER — Other Ambulatory Visit: Payer: Self-pay | Admitting: Radiology

## 2015-07-22 ENCOUNTER — Other Ambulatory Visit (HOSPITAL_COMMUNITY): Payer: Self-pay | Admitting: Interventional Radiology

## 2015-07-22 ENCOUNTER — Ambulatory Visit (HOSPITAL_COMMUNITY)
Admission: RE | Admit: 2015-07-22 | Discharge: 2015-07-22 | Disposition: A | Discharge status: Home / Self Care | Payer: PPO | Source: Ambulatory Visit | Attending: Interventional Radiology | Admitting: Interventional Radiology

## 2015-07-22 DIAGNOSIS — I663 Occlusion and stenosis of cerebellar arteries: Secondary | ICD-10-CM | POA: Diagnosis not present

## 2015-07-22 DIAGNOSIS — I672 Cerebral atherosclerosis: Secondary | ICD-10-CM | POA: Diagnosis not present

## 2015-07-22 DIAGNOSIS — Z8679 Personal history of other diseases of the circulatory system: Secondary | ICD-10-CM | POA: Diagnosis not present

## 2015-07-22 DIAGNOSIS — Z880 Allergy status to penicillin: Secondary | ICD-10-CM | POA: Insufficient documentation

## 2015-07-22 DIAGNOSIS — R202 Paresthesia of skin: Secondary | ICD-10-CM

## 2015-07-22 DIAGNOSIS — Z7982 Long term (current) use of aspirin: Secondary | ICD-10-CM | POA: Diagnosis not present

## 2015-07-22 DIAGNOSIS — I671 Cerebral aneurysm, nonruptured: Secondary | ICD-10-CM | POA: Diagnosis not present

## 2015-07-22 DIAGNOSIS — R209 Unspecified disturbances of skin sensation: Secondary | ICD-10-CM | POA: Diagnosis not present

## 2015-07-22 DIAGNOSIS — R200 Anesthesia of skin: Secondary | ICD-10-CM

## 2015-07-22 LAB — BASIC METABOLIC PANEL
ANION GAP: 9 (ref 5–15)
BUN: 10 mg/dL (ref 6–20)
CALCIUM: 9.3 mg/dL (ref 8.9–10.3)
CO2: 31 mmol/L (ref 22–32)
Chloride: 104 mmol/L (ref 101–111)
Creatinine, Ser: 0.64 mg/dL (ref 0.44–1.00)
Glucose, Bld: 96 mg/dL (ref 65–99)
Potassium: 4 mmol/L (ref 3.5–5.1)
Sodium: 144 mmol/L (ref 135–145)

## 2015-07-22 LAB — CBC
HCT: 41.8 % (ref 36.0–46.0)
HEMOGLOBIN: 13.3 g/dL (ref 12.0–15.0)
MCH: 29 pg (ref 26.0–34.0)
MCHC: 31.8 g/dL (ref 30.0–36.0)
MCV: 91.1 fL (ref 78.0–100.0)
Platelets: 210 10*3/uL (ref 150–400)
RBC: 4.59 MIL/uL (ref 3.87–5.11)
RDW: 14.5 % (ref 11.5–15.5)
WBC: 5.7 10*3/uL (ref 4.0–10.5)

## 2015-07-22 LAB — APTT: APTT: 28 s (ref 24–37)

## 2015-07-22 LAB — PROTIME-INR
INR: 1.05 (ref 0.00–1.49)
PROTHROMBIN TIME: 13.9 s (ref 11.6–15.2)

## 2015-07-22 MED ORDER — MIDAZOLAM HCL 2 MG/2ML IJ SOLN
INTRAMUSCULAR | Status: AC | PRN
Start: 2015-07-22 — End: 2015-07-22
  Administered 2015-07-22: 1 mg via INTRAVENOUS

## 2015-07-22 MED ORDER — LIDOCAINE HCL 1 % IJ SOLN
INTRAMUSCULAR | Status: AC
  Administered 2015-07-22: 10 mL
  Filled 2015-07-22: qty 20

## 2015-07-22 MED ORDER — FENTANYL CITRATE (PF) 100 MCG/2ML IJ SOLN
INTRAMUSCULAR | Status: AC
  Filled 2015-07-22: qty 2

## 2015-07-22 MED ORDER — HEPARIN SODIUM (PORCINE) 1000 UNIT/ML IJ SOLN
INTRAMUSCULAR | Status: AC | PRN
  Administered 2015-07-22: 1000 [IU] via INTRAVENOUS

## 2015-07-22 MED ORDER — SODIUM CHLORIDE 0.9 % IV SOLN
Freq: Once | INTRAVENOUS | Status: AC
  Administered 2015-07-22: 10:00:00 via INTRAVENOUS

## 2015-07-22 MED ORDER — FENTANYL CITRATE (PF) 100 MCG/2ML IJ SOLN
INTRAMUSCULAR | Status: AC | PRN
  Administered 2015-07-22: 25 ug via INTRAVENOUS

## 2015-07-22 MED ORDER — MIDAZOLAM HCL 2 MG/2ML IJ SOLN
INTRAMUSCULAR | Status: AC
Start: 2015-07-22 — End: 2015-07-23
  Filled 2015-07-22: qty 2

## 2015-07-22 MED ORDER — HEPARIN SODIUM (PORCINE) 1000 UNIT/ML IJ SOLN
INTRAMUSCULAR | Status: AC
  Filled 2015-07-22: qty 1

## 2015-07-22 MED ORDER — IOPAMIDOL (ISOVUE-300) INJECTION 61%
INTRAVENOUS | Status: AC
  Administered 2015-07-22: 70 mL
  Filled 2015-07-22: qty 150

## 2015-07-22 MED ORDER — SODIUM CHLORIDE 0.9 % IV SOLN: INTRAVENOUS | Status: AC

## 2015-07-22 NOTE — Sedation Documentation (Signed)
Patient is resting comfortably. 

## 2015-07-22 NOTE — Procedures (Signed)
S/P  4 vessel cerebral arteriogram. RT CFa approach. Findings. 1.Obliterated RT Pcom aneurysm. 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

## 2015-07-22 NOTE — Sedation Documentation (Addendum)
IR tech holding pressure to R groin and placing exoseal

## 2015-07-22 NOTE — Discharge Instructions (Signed)
Angiogram, Care After °Refer to this sheet in the next few weeks. These instructions provide you with information about caring for yourself after your procedure. Your health care provider may also give you more specific instructions. Your treatment has been planned according to current medical practices, but problems sometimes occur. Call your health care provider if you have any problems or questions after your procedure. °WHAT TO EXPECT AFTER THE PROCEDURE °After your procedure, it is typical to have the following: °· Bruising at the catheter insertion site that usually fades within 1-2 weeks. °· Blood collecting in the tissue (hematoma) that may be painful to the touch. It should usually decrease in size and tenderness within 1-2 weeks. °HOME CARE INSTRUCTIONS °· Take medicines only as directed by your health care provider. °· You may shower 24-48 hours after the procedure or as directed by your health care provider. Remove the bandage (dressing) and gently wash the site with plain soap and water. Pat the area dry with a clean towel. Do not rub the site, because this may cause bleeding. °· Do not take baths, swim, or use a hot tub until your health care provider approves. °· Check your insertion site every day for redness, swelling, or drainage. °· Do not apply powder or lotion to the site. °· Do not lift over 10 lb (4.5 kg) for 5 days after your procedure or as directed by your health care provider. °· Ask your health care provider when it is okay to: °¨ Return to work or school. °¨ Resume usual physical activities or sports. °¨ Resume sexual activity. °· Do not drive home if you are discharged the same day as the procedure. Have someone else drive you. °· You may drive 24 hours after the procedure unless otherwise instructed by your health care provider. °· Do not operate machinery or power tools for 24 hours after the procedure or as directed by your health care provider. °· If your procedure was done as an  outpatient procedure, which means that you went home the same day as your procedure, a responsible adult should be with you for the first 24 hours after you arrive home. °· Keep all follow-up visits as directed by your health care provider. This is important. °SEEK MEDICAL CARE IF: °· You have a fever. °· You have chills. °· You have increased bleeding from the catheter insertion site. Hold pressure on the site. °SEEK IMMEDIATE MEDICAL CARE IF: °· You have unusual pain at the catheter insertion site. °· You have redness, warmth, or swelling at the catheter insertion site. °· You have drainage (other than a small amount of blood on the dressing) from the catheter insertion site. °· The catheter insertion site is bleeding, and the bleeding does not stop after 30 minutes of holding steady pressure on the site. °· The area near or just beyond the catheter insertion site becomes pale, cool, tingly, or numb. °  °This information is not intended to replace advice given to you by your health care provider. Make sure you discuss any questions you have with your health care provider. °  °Document Released: 09/17/2004 Document Revised: 03/22/2014 Document Reviewed: 08/02/2012 °Elsevier Interactive Patient Education ©2016 [employer] ° °

## 2015-07-22 NOTE — H&P (Signed)
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 She was lost to f/u for several years and now presents with 1 month history of left sided paresthesias. Recent CT angio of head and neck on 07/08/15 revealed no emergent large vessel occlusion with stable ICA aneurysm. There was some intracranial atherosclerosis with mild irregularity of the basilar, bilateral PCA's and ant communicating arteries. Following recent discussions with Dr. Estanislado Pandy she presents again today for f/u cerebral arteriogram to assess degree of vascular disease. She denies fever, CP,dyspnea, cough, abd pain,N/V or bleeding. She does have occ HA's and back pain. She is anxious.   Allergies: Cortisone and Penicillins  Medications: Prior to Admission medications   Medication Sig Start Date End Date Taking? Authorizing Provider  ALPRAZolam (XANAX) 1 MG tablet TAKE 1 TABLET BY MOUTH THREE TIMES DAILY AS NEEDED Patient taking differently: TAKE 2 TABLETS BY MOUTH DAILY AT BEDTIME 12/24/10  Yes Hoover Browns., MD  aspirin EC 81 MG tablet Take 81 mg by mouth at bedtime.   Yes Historical Provider, MD  CHIA SEED PO Take 15 mLs by mouth daily.   Yes Historical Provider, MD  HYDROcodone-acetaminophen (NORCO) 10-325 MG tablet Take 1 tablet by mouth 3 (three) times daily as needed (pain).  06/09/15  Yes Historical Provider, MD  imipramine (TOFRANIL) 50 MG tablet TAKE 3 TABLETS AT BEDTIME 06/08/10  Yes Hoover Browns., MD  nicotine polacrilex (NICORETTE) 2 MG gum Take 2 mg by mouth every 2 (two) hours.   Yes Historical Provider, MD  HYDROcodone-acetaminophen (NORCO/VICODIN) 5-325 MG tablet Take 1-2 tablets by mouth every 4 (four) hours as needed for moderate pain. Patient not taking: Reported on  07/18/2015 03/17/15   Kristeen Miss, MD     Vital Signs:   Physical Exam awake/alert; chest- CTA bilat; heart- RRR; abd- soft,+BS,NT; ext- no edema; no focal neuro findings at present  Imaging: No results found.  Labs:  CBC:  Recent Labs  03/04/15 1348 07/07/15 1414 07/07/15 1415 07/22/15 0930  WBC 4.0 7.7  --  5.7  HGB 13.0 12.7 14.3 13.3  HCT 40.2 39.0 42.0 41.8  PLT 219 266  --  210    COAGS:  Recent Labs  07/07/15 1414 07/22/15 0930  INR 1.06 1.05  APTT 28 28    BMP:  Recent Labs  07/07/15 1414 07/07/15 1415  NA 141 140  K 3.8 3.7  CL 103 100*  CO2 28  --   GLUCOSE 157* 152*  BUN 10 12  CALCIUM 9.1  --   CREATININE 0.67 0.60  GFRNONAA >60  --   GFRAA >60  --     LIVER FUNCTION TESTS:  Recent Labs  07/07/15 1414  BILITOT 0.3  AST 25  ALT 18  ALKPHOS 58  PROT 6.5  ALBUMIN 3.8    Assessment and Plan:  Pt with 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 Estanislado Pandy. She was lost to f/u for several years and now presents with 1 month history of left sided paresthesias. Recent CT angio of head and neck on 07/08/15 revealed no emergent large vessel occlusion with stable ICA aneurysm. There was some intracranial atherosclerosis with mild irregularity of the basilar, bilateral PCA's and ant communicating arteries. Following recent discussions with Dr.  Deveshwar she presents again today for f/u cerebral arteriogram to assess degree of vascular disease.Risks and benefits discussed with the patient/husband including, but not limited to bleeding, infection, vascular injury or contrast induced renal failure.All of the patient's questions were answered, patient is agreeable to proceed.Consent signed and in chart.    Electronically Signed: D. Rowe Robert 07/22/2015, 10:15 AM   I spent a total of 20 minutes at the the patient's bedside AND on the patient's hospital floor or unit, greater than 50% of which was counseling/coordinating  care for cerebral arteriogram

## 2015-07-22 NOTE — Sedation Documentation (Signed)
Awake and talking, RN encourages pt to close eyes and relax

## 2015-07-30 DIAGNOSIS — M50222 Other cervical disc displacement at C5-C6 level: Secondary | ICD-10-CM | POA: Diagnosis not present

## 2015-08-26 DIAGNOSIS — Z780 Asymptomatic menopausal state: Secondary | ICD-10-CM | POA: Diagnosis not present

## 2015-08-26 DIAGNOSIS — M545 Low back pain: Secondary | ICD-10-CM | POA: Diagnosis not present

## 2015-08-26 DIAGNOSIS — Z23 Encounter for immunization: Secondary | ICD-10-CM | POA: Diagnosis not present

## 2015-08-26 DIAGNOSIS — F411 Generalized anxiety disorder: Secondary | ICD-10-CM | POA: Diagnosis not present

## 2015-08-26 DIAGNOSIS — Z79899 Other long term (current) drug therapy: Secondary | ICD-10-CM | POA: Diagnosis not present

## 2015-09-12 DIAGNOSIS — Z1231 Encounter for screening mammogram for malignant neoplasm of breast: Secondary | ICD-10-CM | POA: Diagnosis not present

## 2015-11-20 DIAGNOSIS — M4310 Spondylolisthesis, site unspecified: Secondary | ICD-10-CM | POA: Diagnosis not present

## 2016-02-25 DIAGNOSIS — M545 Low back pain: Secondary | ICD-10-CM | POA: Diagnosis not present

## 2016-02-25 DIAGNOSIS — Z780 Asymptomatic menopausal state: Secondary | ICD-10-CM | POA: Diagnosis not present

## 2016-02-25 DIAGNOSIS — F320 Major depressive disorder, single episode, mild: Secondary | ICD-10-CM | POA: Diagnosis not present

## 2016-02-25 DIAGNOSIS — F411 Generalized anxiety disorder: Secondary | ICD-10-CM | POA: Diagnosis not present

## 2016-05-07 DIAGNOSIS — H43813 Vitreous degeneration, bilateral: Secondary | ICD-10-CM | POA: Diagnosis not present

## 2016-05-07 DIAGNOSIS — H2513 Age-related nuclear cataract, bilateral: Secondary | ICD-10-CM | POA: Diagnosis not present

## 2016-05-07 DIAGNOSIS — H353132 Nonexudative age-related macular degeneration, bilateral, intermediate dry stage: Secondary | ICD-10-CM | POA: Diagnosis not present

## 2016-06-07 DIAGNOSIS — H2513 Age-related nuclear cataract, bilateral: Secondary | ICD-10-CM | POA: Diagnosis not present

## 2016-06-07 DIAGNOSIS — H43813 Vitreous degeneration, bilateral: Secondary | ICD-10-CM | POA: Diagnosis not present

## 2016-06-07 DIAGNOSIS — H353132 Nonexudative age-related macular degeneration, bilateral, intermediate dry stage: Secondary | ICD-10-CM | POA: Diagnosis not present

## 2016-07-20 ENCOUNTER — Telehealth (HOSPITAL_COMMUNITY): Payer: Self-pay

## 2016-07-20 NOTE — Telephone Encounter (Signed)
Called to schedule f/u cta. Pt asked me to call her back. She just left the hospital with her mom. AW

## 2016-08-27 DIAGNOSIS — M545 Low back pain: Secondary | ICD-10-CM | POA: Diagnosis not present

## 2016-08-27 DIAGNOSIS — Z1211 Encounter for screening for malignant neoplasm of colon: Secondary | ICD-10-CM | POA: Diagnosis not present

## 2016-08-27 DIAGNOSIS — F320 Major depressive disorder, single episode, mild: Secondary | ICD-10-CM | POA: Diagnosis not present

## 2016-08-27 DIAGNOSIS — F411 Generalized anxiety disorder: Secondary | ICD-10-CM | POA: Diagnosis not present

## 2016-08-27 DIAGNOSIS — Z780 Asymptomatic menopausal state: Secondary | ICD-10-CM | POA: Diagnosis not present

## 2016-09-13 ENCOUNTER — Telehealth (HOSPITAL_COMMUNITY): Payer: Self-pay

## 2016-09-13 NOTE — Telephone Encounter (Signed)
Pt would like to wait until November to schedule after her daughter's wedding. She has a lot going on at the moment. AW

## 2016-10-19 DIAGNOSIS — Z1211 Encounter for screening for malignant neoplasm of colon: Secondary | ICD-10-CM | POA: Diagnosis not present

## 2016-10-19 DIAGNOSIS — Z9049 Acquired absence of other specified parts of digestive tract: Secondary | ICD-10-CM | POA: Diagnosis not present

## 2016-10-24 IMAGING — RF DG LUMBAR SPINE 2-3V
1 series · 2 of 2 positions shown · non-contrast
Comparison: 02/19/2015 and MRI of 02/07/2015.

CLINICAL DATA: Anterior and posterior lumbar fusion.

EXAM:
DG C-ARM 61-120 MIN; LUMBAR SPINE - 2-3 VIEW

[Series 1: run · 2 of 2 slices shown]
[im 1/2]
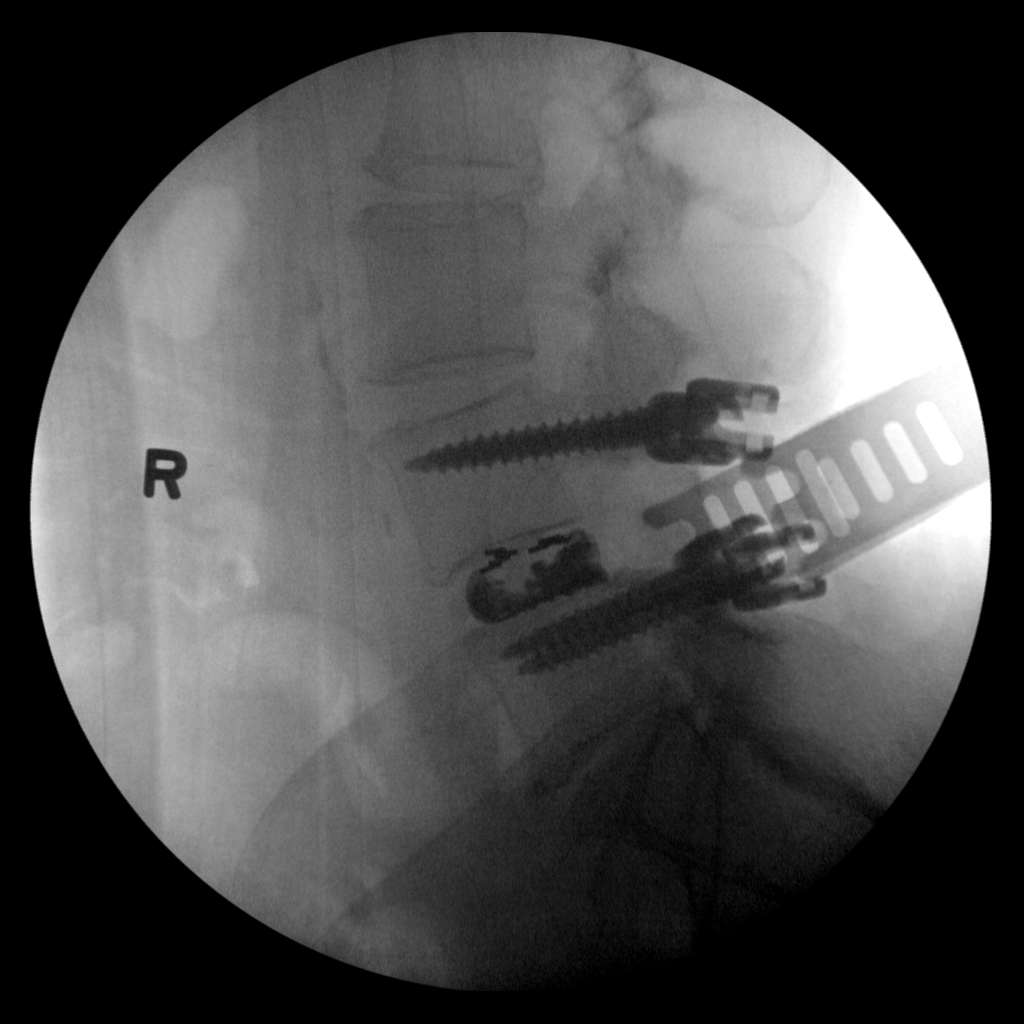
[im 2/2]
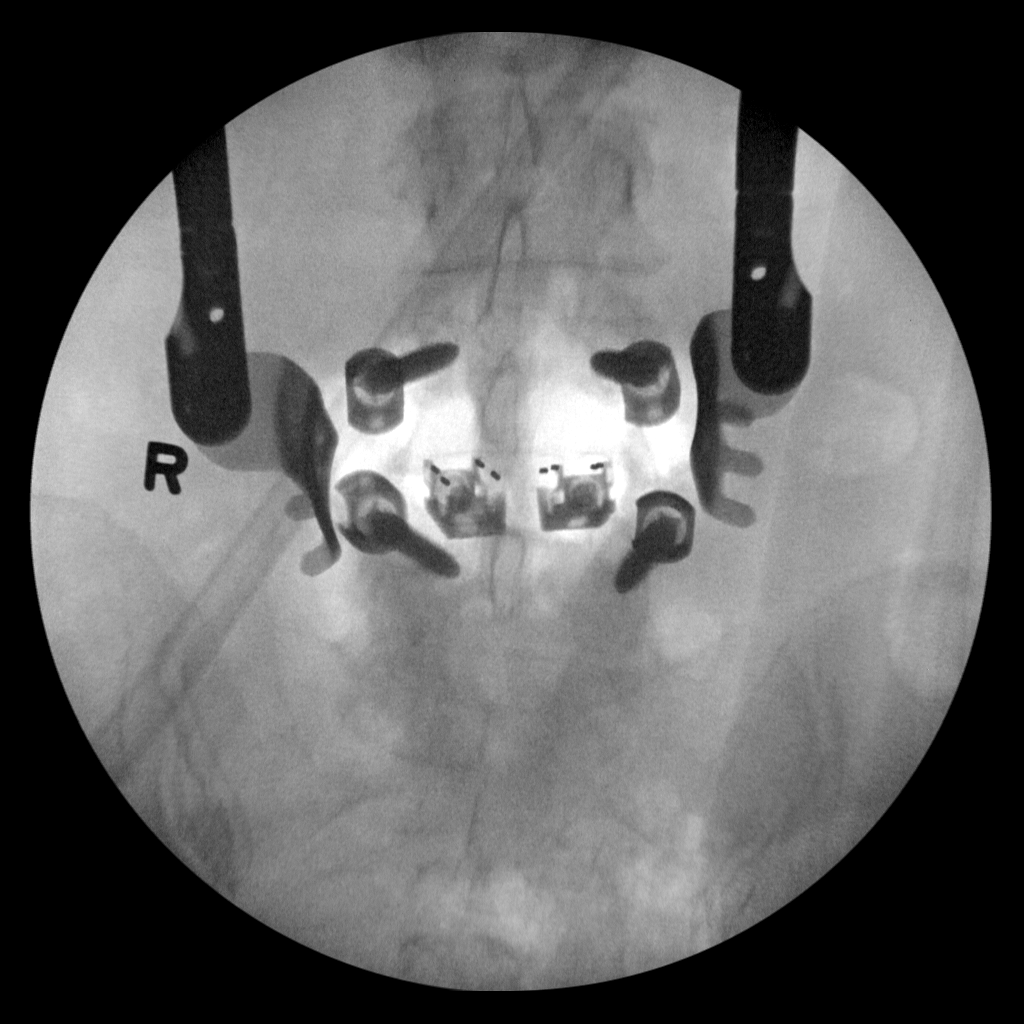

[2 of 2 positions shown; findings below may reference images not displayed]

FINDINGS: AP and lateral views. This demonstrates trans pedicle screw fixation
at L4-5. Loss of intervertebral disc height at L5-S1. No acute
hardware complication.
IMPRESSION: Intraoperative imaging of L4-5 fixation.

## 2016-10-25 DIAGNOSIS — Z1231 Encounter for screening mammogram for malignant neoplasm of breast: Secondary | ICD-10-CM | POA: Diagnosis not present

## 2016-10-31 DIAGNOSIS — Z1211 Encounter for screening for malignant neoplasm of colon: Secondary | ICD-10-CM | POA: Diagnosis not present

## 2016-10-31 DIAGNOSIS — Z1212 Encounter for screening for malignant neoplasm of rectum: Secondary | ICD-10-CM | POA: Diagnosis not present

## 2017-01-31 ENCOUNTER — Other Ambulatory Visit (HOSPITAL_COMMUNITY): Payer: Self-pay | Admitting: Interventional Radiology

## 2017-01-31 DIAGNOSIS — I729 Aneurysm of unspecified site: Secondary | ICD-10-CM

## 2017-01-31 DIAGNOSIS — I771 Stricture of artery: Secondary | ICD-10-CM

## 2017-02-16 IMAGING — CT CT HEAD W/O CM
1 of 2 series · 16 of 30 positions shown, 20 images · non-contrast
Comparison: 10/29/2008

CLINICAL DATA: Code stroke. Left-sided weakness, progressively
worsening.

EXAM:
CT HEAD WITHOUT CONTRAST
TECHNIQUE: Contiguous axial images were obtained from the base of the skull
through the vertex without intravenous contrast.

[Series 3: head 2.0 h70h · axial · 0.41mm/px · z∈[-87,+57]mm · 16 of 80 slices shown, 20 images]
[im 4/80  brain]
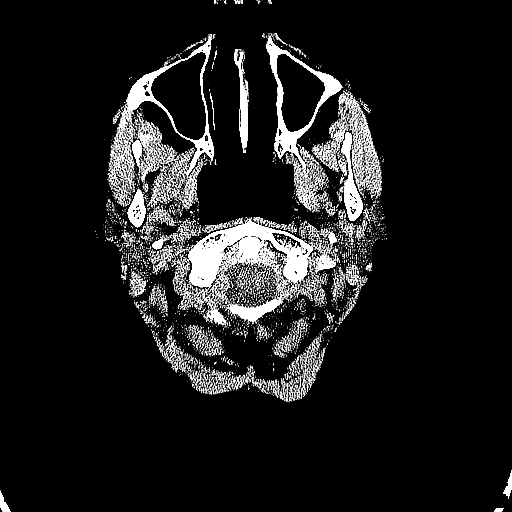
[im 4/80  bone]
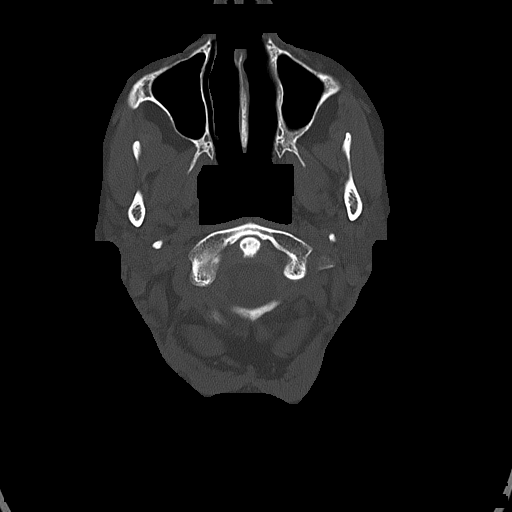
[im 8/80  brain]
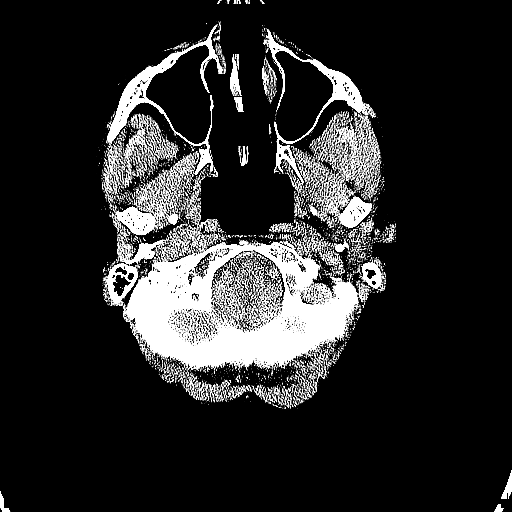
[im 12/80  brain]
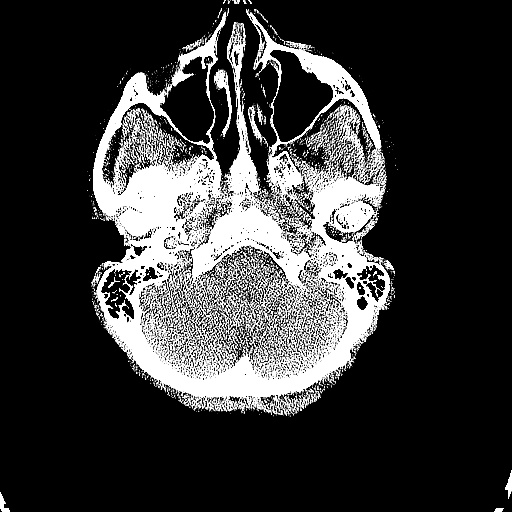
[im 20/80  brain]
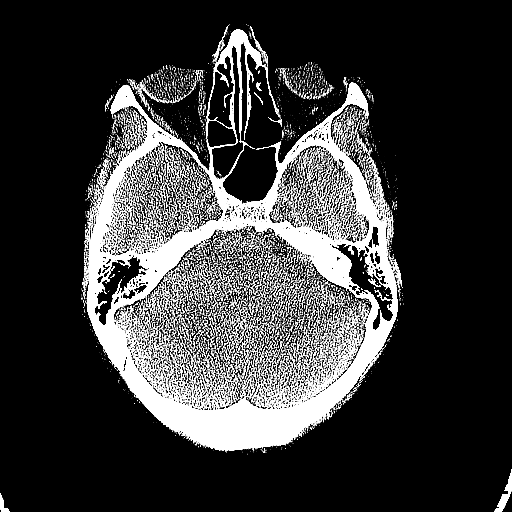
[im 24/80  brain]
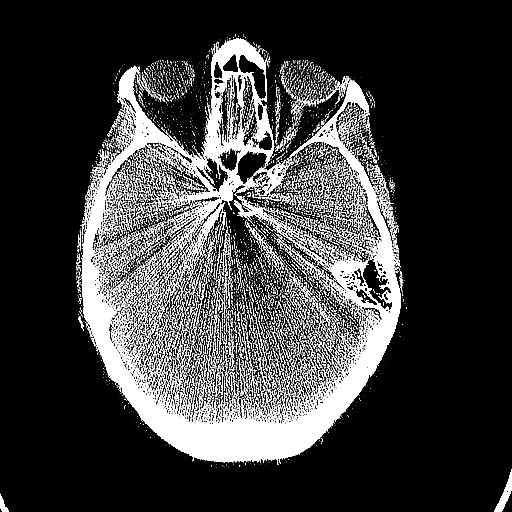
[im 24/80  bone]
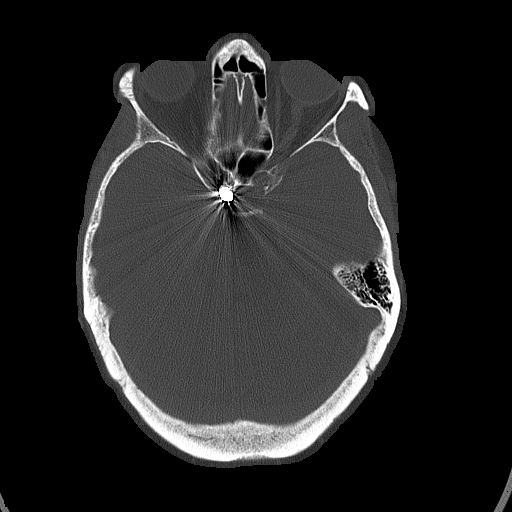
[im 28/80  brain]
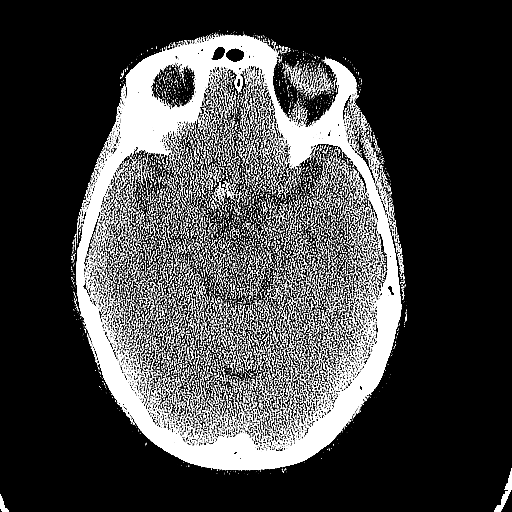
[im 32/80  brain]
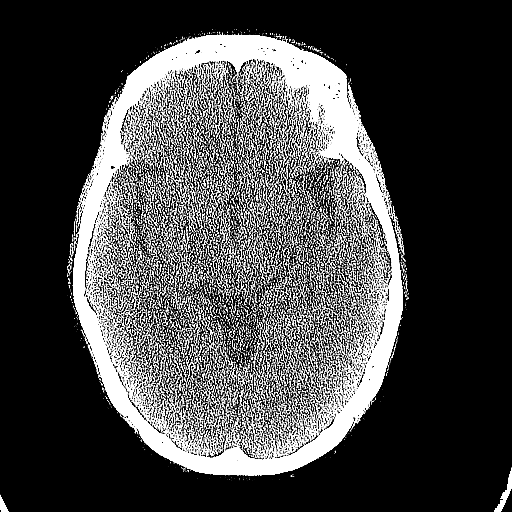
[im 36/80  brain]
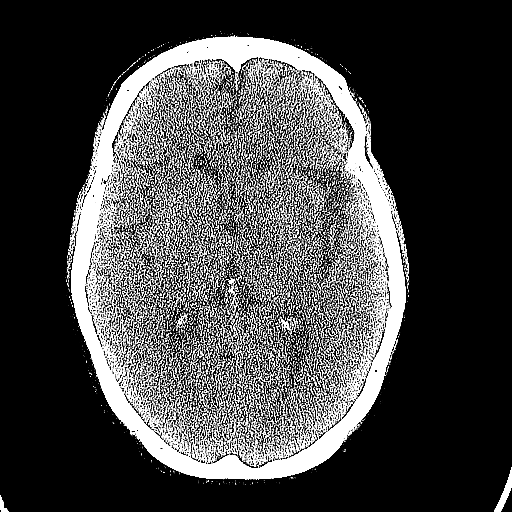
[im 44/80  brain]
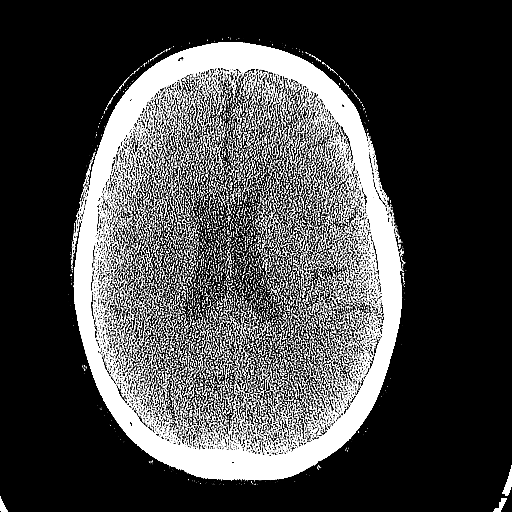
[im 44/80  bone]
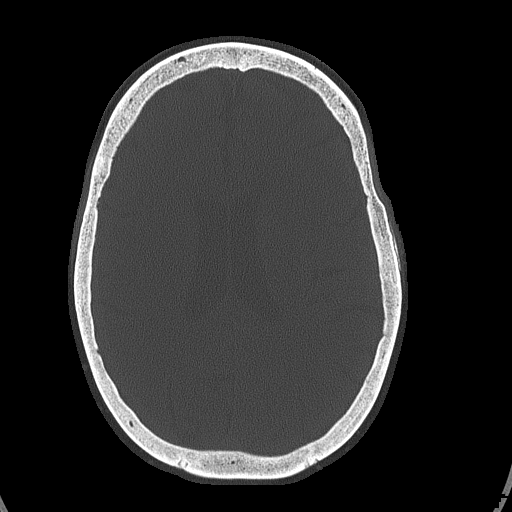
[im 48/80  brain]
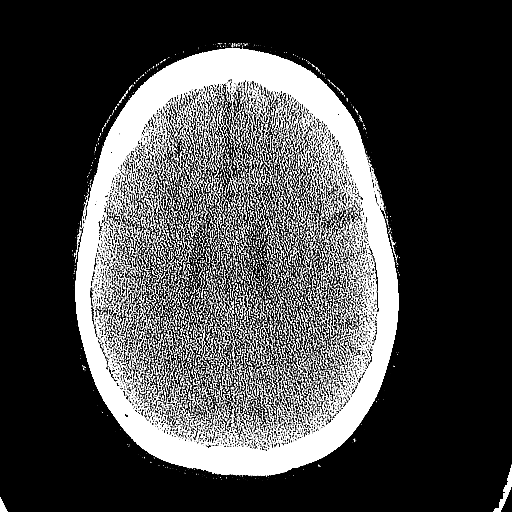
[im 52/80  brain]
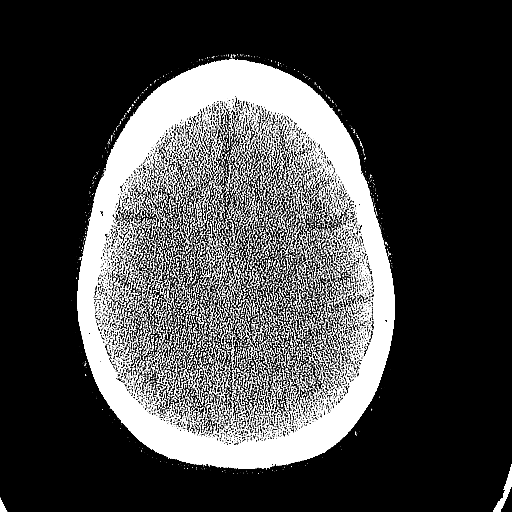
[im 56/80  brain]
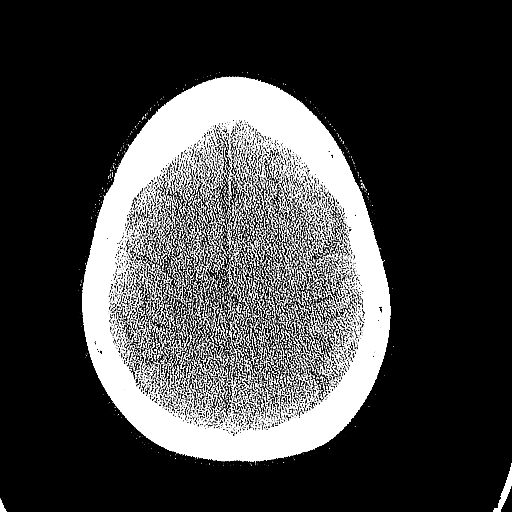
[im 60/80  brain]
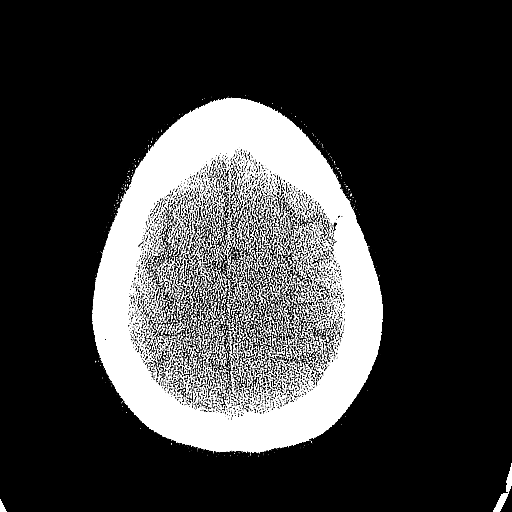
[im 60/80  bone]
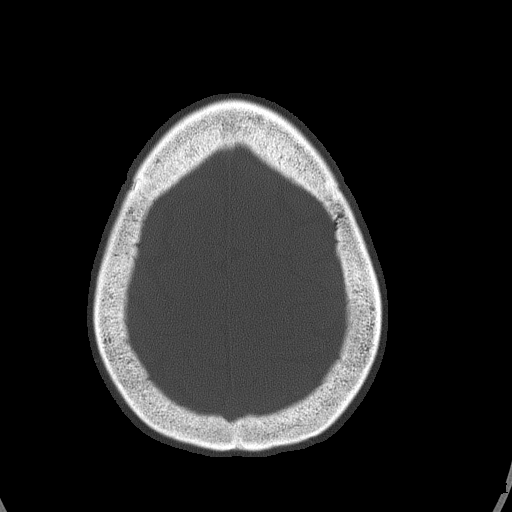
[im 68/80  brain]
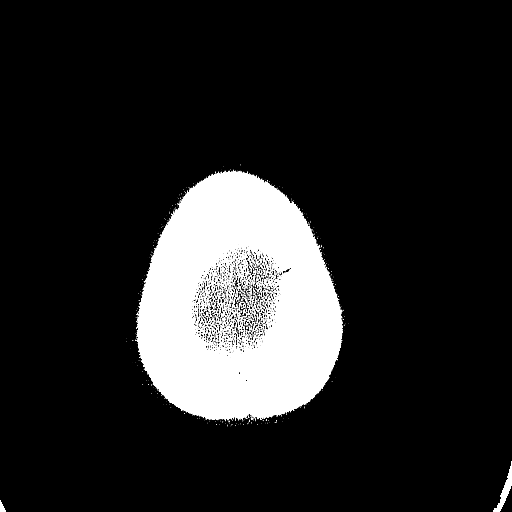
[im 72/80  brain]
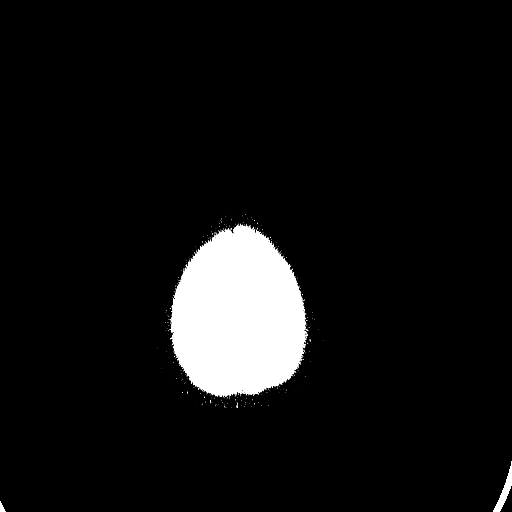
[im 76/80  brain]
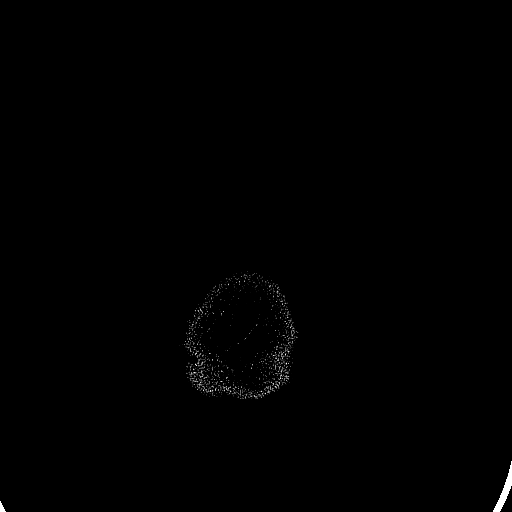

[16 of 30 positions shown; findings below may reference images not displayed]

FINDINGS: Previous aneurysm coiling in the region of the right ICA. This is
unchanged since 6191. No acute intracranial abnormality.
Specifically, no hemorrhage, hydrocephalus, mass lesion, acute
infarction, or significant intracranial injury. No acute calvarial
abnormality. Visualized paranasal sinuses and mastoids clear.
Orbital soft tissues unremarkable.
IMPRESSION: No acute intracranial abnormality.

Critical Value/emergent results were called by telephone at the time
of interpretation on 07/07/2015 at [DATE] to Dr. Tjihinga, who
verbally acknowledged these results.

## 2017-02-23 DIAGNOSIS — M25562 Pain in left knee: Secondary | ICD-10-CM | POA: Diagnosis not present

## 2017-03-22 DIAGNOSIS — M545 Low back pain: Secondary | ICD-10-CM | POA: Diagnosis not present

## 2017-03-22 DIAGNOSIS — M150 Primary generalized (osteo)arthritis: Secondary | ICD-10-CM | POA: Diagnosis not present

## 2017-03-22 DIAGNOSIS — F411 Generalized anxiety disorder: Secondary | ICD-10-CM | POA: Diagnosis not present

## 2017-03-22 DIAGNOSIS — N952 Postmenopausal atrophic vaginitis: Secondary | ICD-10-CM | POA: Diagnosis not present

## 2017-03-22 DIAGNOSIS — F320 Major depressive disorder, single episode, mild: Secondary | ICD-10-CM | POA: Diagnosis not present

## 2017-03-28 ENCOUNTER — Ambulatory Visit (HOSPITAL_COMMUNITY)
Admission: RE | Admit: 2017-03-28 | Discharge: 2017-03-28 | Disposition: A | Discharge status: Home / Self Care | Payer: PPO | Source: Ambulatory Visit | Attending: Interventional Radiology | Admitting: Interventional Radiology

## 2017-03-28 ENCOUNTER — Encounter (HOSPITAL_COMMUNITY): Payer: Self-pay

## 2017-03-28 ENCOUNTER — Ambulatory Visit (HOSPITAL_COMMUNITY): Payer: PPO

## 2017-03-28 DIAGNOSIS — I671 Cerebral aneurysm, nonruptured: Secondary | ICD-10-CM | POA: Diagnosis not present

## 2017-03-28 DIAGNOSIS — I729 Aneurysm of unspecified site: Secondary | ICD-10-CM

## 2017-03-28 DIAGNOSIS — I771 Stricture of artery: Secondary | ICD-10-CM | POA: Diagnosis not present

## 2017-03-28 DIAGNOSIS — I728 Aneurysm of other specified arteries: Secondary | ICD-10-CM | POA: Diagnosis not present

## 2017-03-28 LAB — POCT I-STAT CREATININE: CREATININE: 0.8 mg/dL (ref 0.44–1.00)

## 2017-03-28 MED ORDER — IOPAMIDOL (ISOVUE-370) INJECTION 76%
INTRAVENOUS | Status: AC
  Administered 2017-03-28: 50 mL
  Filled 2017-03-28: qty 50

## 2017-03-30 DIAGNOSIS — M1712 Unilateral primary osteoarthritis, left knee: Secondary | ICD-10-CM | POA: Diagnosis not present

## 2017-04-01 ENCOUNTER — Telehealth (HOSPITAL_COMMUNITY): Payer: Self-pay

## 2017-04-01 NOTE — Telephone Encounter (Signed)
Pt agreed to f/u in 1 yr with mri/mra. AW

## 2017-04-06 DIAGNOSIS — M1712 Unilateral primary osteoarthritis, left knee: Secondary | ICD-10-CM | POA: Diagnosis not present

## 2017-04-13 DIAGNOSIS — M1712 Unilateral primary osteoarthritis, left knee: Secondary | ICD-10-CM | POA: Diagnosis not present

## 2017-09-20 DIAGNOSIS — Z1159 Encounter for screening for other viral diseases: Secondary | ICD-10-CM | POA: Diagnosis not present

## 2017-09-20 DIAGNOSIS — M545 Low back pain: Secondary | ICD-10-CM | POA: Diagnosis not present

## 2017-09-20 DIAGNOSIS — M150 Primary generalized (osteo)arthritis: Secondary | ICD-10-CM | POA: Diagnosis not present

## 2017-09-20 DIAGNOSIS — N952 Postmenopausal atrophic vaginitis: Secondary | ICD-10-CM | POA: Diagnosis not present

## 2017-09-20 DIAGNOSIS — F411 Generalized anxiety disorder: Secondary | ICD-10-CM | POA: Diagnosis not present

## 2017-09-20 DIAGNOSIS — F320 Major depressive disorder, single episode, mild: Secondary | ICD-10-CM | POA: Diagnosis not present

## 2017-10-21 DIAGNOSIS — M25561 Pain in right knee: Secondary | ICD-10-CM | POA: Diagnosis not present

## 2017-10-21 DIAGNOSIS — M545 Low back pain: Secondary | ICD-10-CM | POA: Diagnosis not present

## 2017-10-24 DIAGNOSIS — M7631 Iliotibial band syndrome, right leg: Secondary | ICD-10-CM | POA: Diagnosis not present

## 2017-10-24 DIAGNOSIS — M1991 Primary osteoarthritis, unspecified site: Secondary | ICD-10-CM | POA: Diagnosis not present

## 2017-10-24 DIAGNOSIS — M419 Scoliosis, unspecified: Secondary | ICD-10-CM | POA: Diagnosis not present

## 2017-10-26 DIAGNOSIS — M25561 Pain in right knee: Secondary | ICD-10-CM | POA: Diagnosis not present

## 2017-10-28 DIAGNOSIS — M1991 Primary osteoarthritis, unspecified site: Secondary | ICD-10-CM | POA: Diagnosis not present

## 2017-10-28 DIAGNOSIS — M419 Scoliosis, unspecified: Secondary | ICD-10-CM | POA: Diagnosis not present

## 2017-10-28 DIAGNOSIS — M7631 Iliotibial band syndrome, right leg: Secondary | ICD-10-CM | POA: Diagnosis not present

## 2017-11-02 DIAGNOSIS — Z1231 Encounter for screening mammogram for malignant neoplasm of breast: Secondary | ICD-10-CM | POA: Diagnosis not present

## 2017-11-02 DIAGNOSIS — M1991 Primary osteoarthritis, unspecified site: Secondary | ICD-10-CM | POA: Diagnosis not present

## 2017-11-02 DIAGNOSIS — M7631 Iliotibial band syndrome, right leg: Secondary | ICD-10-CM | POA: Diagnosis not present

## 2017-11-02 DIAGNOSIS — M419 Scoliosis, unspecified: Secondary | ICD-10-CM | POA: Diagnosis not present

## 2017-11-03 DIAGNOSIS — M7631 Iliotibial band syndrome, right leg: Secondary | ICD-10-CM | POA: Diagnosis not present

## 2017-11-03 DIAGNOSIS — M1991 Primary osteoarthritis, unspecified site: Secondary | ICD-10-CM | POA: Diagnosis not present

## 2017-11-03 DIAGNOSIS — M419 Scoliosis, unspecified: Secondary | ICD-10-CM | POA: Diagnosis not present

## 2017-11-28 DIAGNOSIS — M1991 Primary osteoarthritis, unspecified site: Secondary | ICD-10-CM | POA: Diagnosis not present

## 2018-03-18 ENCOUNTER — Other Ambulatory Visit: Payer: Self-pay

## 2018-03-18 ENCOUNTER — Encounter (HOSPITAL_COMMUNITY): Payer: Self-pay | Admitting: *Deleted

## 2018-03-18 ENCOUNTER — Emergency Department (HOSPITAL_COMMUNITY)
Admission: EM | Admit: 2018-03-18 | Discharge: 2018-03-18 | Disposition: A | Discharge status: Home / Self Care | Payer: Medicare HMO | Attending: Emergency Medicine | Admitting: Emergency Medicine

## 2018-03-18 DIAGNOSIS — Z79899 Other long term (current) drug therapy: Secondary | ICD-10-CM | POA: Diagnosis not present

## 2018-03-18 DIAGNOSIS — M792 Neuralgia and neuritis, unspecified: Secondary | ICD-10-CM | POA: Diagnosis not present

## 2018-03-18 DIAGNOSIS — G518 Other disorders of facial nerve: Secondary | ICD-10-CM | POA: Diagnosis not present

## 2018-03-18 DIAGNOSIS — Z8673 Personal history of transient ischemic attack (TIA), and cerebral infarction without residual deficits: Secondary | ICD-10-CM | POA: Insufficient documentation

## 2018-03-18 DIAGNOSIS — B029 Zoster without complications: Secondary | ICD-10-CM | POA: Diagnosis not present

## 2018-03-18 DIAGNOSIS — Z87891 Personal history of nicotine dependence: Secondary | ICD-10-CM | POA: Insufficient documentation

## 2018-03-18 DIAGNOSIS — B0229 Other postherpetic nervous system involvement: Secondary | ICD-10-CM

## 2018-03-18 DIAGNOSIS — Z7982 Long term (current) use of aspirin: Secondary | ICD-10-CM | POA: Insufficient documentation

## 2018-03-18 DIAGNOSIS — G501 Atypical facial pain: Secondary | ICD-10-CM | POA: Diagnosis present

## 2018-03-18 MED ORDER — PREGABALIN 50 MG PO CAPS: 50.0000 mg | ORAL_CAPSULE | Freq: Three times a day (TID) | ORAL | 0 refills | Status: DC

## 2018-03-18 MED ORDER — NAPROXEN 375 MG PO TABS: 375.0000 mg | ORAL_TABLET | Freq: Two times a day (BID) | ORAL | 0 refills | Status: DC

## 2018-03-18 MED ORDER — HYDROCODONE-ACETAMINOPHEN 5-325 MG PO TABS: 1.0000 | ORAL_TABLET | Freq: Four times a day (QID) | ORAL | 0 refills | Status: DC | PRN

## 2018-03-18 MED ORDER — VALACYCLOVIR HCL 1 G PO TABS: 1000.0000 mg | ORAL_TABLET | Freq: Three times a day (TID) | ORAL | 0 refills | Status: DC

## 2018-03-18 NOTE — Discharge Instructions (Addendum)
Call Dr. Alroy Dust on Monday for follow up. Return here if symptoms worsen before then.

## 2018-03-18 NOTE — ED Triage Notes (Signed)
Pt seen @ [facility] @ 1800, meds called in to [facility] but they were unable to fill them d/t not having the meds.  [facility] was able to give her 2 pills of the abx rx'd.  Pt stated "They told me I had the shingles."

## 2018-03-18 NOTE — ED Notes (Signed)
Bed: [facility]PT1 Expected date:  Expected time:  Means of arrival:  Comments: 

## 2018-03-18 NOTE — ED Provider Notes (Signed)
[facility] DEPT Provider Note   CSN: [id number] Arrival date & time: 03/18/18  1950     History   Chief Complaint Chief Complaint  Patient presents with  . needs medication    HPI Cassandra Alexander is a 73 y.o. female who presents to the ED for pain. Patient reports she went to an Urgent Care yesterday and was prescribed mediation for shingles. She is not sure what the medications were but when she went to pick them up her pharmacy was closed. They told her to go to another store that the pharmacy was open. The patient went and they told her they did not have the medication. Patient reports severe facial pain where the shingles are and requesting pain medication.   HPI  Past Medical History:  Diagnosis Date  . Anxiety   . Depression   . Diverticula of colon   . Hyperlipidemia   . IBS (irritable bowel syndrome)   . Osteoarthritis     Patient Active Problem List   Diagnosis Date Noted  . S/P cerebral aneurysm operation   . TIA (transient ischemic attack) 07/07/2015  . Anxiety and depression 07/07/2015  . Headache 07/07/2015  . Left sided numbness 07/07/2015    Past Surgical History:  Procedure Laterality Date  . CESAREAN SECTION     X 2  . COLONOSCOPY    . RECTAL PROLAPSE REPAIR    . REPLACEMENT TOTAL KNEE Right   . TONSILLECTOMY       OB History   No obstetric history on file.      Home Medications    Prior to Admission medications   Medication Sig Start Date End Date Taking? Authorizing Provider  ALPRAZolam (XANAX) 1 MG tablet TAKE 1 TABLET BY MOUTH THREE TIMES DAILY AS NEEDED Patient taking differently: TAKE 2 TABLETS BY MOUTH DAILY AT BEDTIME 12/24/10   Hoover Browns., MD  aspirin EC 81 MG tablet Take 81 mg by mouth at bedtime.    [provider]  CHIA SEED PO Take 15 mLs by mouth daily.    [provider]  imipramine (TOFRANIL) 50 MG tablet TAKE 3 TABLETS AT BEDTIME 06/08/10   Hoover Browns., MD  naproxen (NAPROSYN) 375 MG tablet Take 1 tablet (375 mg total) by mouth 2 (two) times daily. 03/18/18   Ashley Murrain, NP  nicotine polacrilex (NICORETTE) 2 MG gum Take 2 mg by mouth every 2 (two) hours.    [provider]  pregabalin (LYRICA) 50 MG capsule Take 1 capsule (50 mg total) by mouth 3 (three) times daily. 03/18/18   Ashley Murrain, NP  valACYclovir (VALTREX) 1000 MG tablet Take 1 tablet (1,000 mg total) by mouth 3 (three) times daily. 03/18/18   Ashley Murrain, NP    Family History No family history on file.  Social History Social History   Tobacco Use  . Smoking status: Former Smoker    Last attempt to quit: 03/15/1990    Years since quitting: 28.0  . Smokeless tobacco: Never Used  Substance Use Topics  . Alcohol use: Yes    Comment: occ..   . Drug use: No     Allergies   Cortisone and Penicillins   Review of Systems Review of Systems  HENT: Positive for ear pain and facial swelling.   All other systems reviewed and are negative.    Physical Exam Updated Vital Signs BP (!) 160/103 (BP Location: Left Arm)   Pulse  77   Temp (!) 97.1 F (36.2 C) (Oral)   Resp 18   Ht 5' (1.524 m)   Wt 51 kg   SpO2 93%   BMI 21.97 kg/m   Physical Exam Vitals signs and nursing note reviewed.  Constitutional:      General: She is not in acute distress.    Appearance: She is well-developed.  HENT:     Head:     Comments: Vesicular lesion to the right side of the face at the lip and chin.     Right Ear: Tympanic membrane normal.     Left Ear: Tympanic membrane normal.     Nose: Nose normal.     Mouth/Throat:     Mouth: Mucous membranes are moist.  Eyes:     Conjunctiva/sclera: Conjunctivae normal.  Neck:     Musculoskeletal: Neck supple.  Cardiovascular:     Rate and Rhythm: Normal rate.  Pulmonary:     Effort: Pulmonary effort is normal.  Abdominal:     Palpations: Abdomen is soft.     Tenderness: There is no abdominal tenderness.    Musculoskeletal: Normal range of motion.  Skin:    General: Skin is warm and dry.  Neurological:     Mental Status: She is alert and oriented to person, place, and time.  Psychiatric:        Mood and Affect: Mood normal.      ED Treatments / Results  Labs (all labs ordered are listed, but only abnormal results are displayed) Labs Reviewed - No data to display  Radiology No results found.  Procedures Procedures (including critical care time)  Medications Ordered in ED Medications - No data to display   Initial Impression / Assessment and Plan / ED Course  I have reviewed the triage vital signs and the nursing notes. 73 y.o. female here for pain management for shingles of face stable for d/c and does not appear toxic. Eyes not affected, TM's normal. Patient to f/u with her PCP on Monday. Return precautions discussed. Dr. Zenia Resides in to see the patient.   Final Clinical Impressions(s) / ED Diagnoses   Final diagnoses:  Herpes zoster virus infection of face and ear nerves   Initially wrote for hydrocodone but patient reports she is in pain management and can not have Rx for hydrocodone and request Lyrica instead. Rx for Lyrica given and d/c hydrocodone.   ED Discharge Orders         Ordered    valACYclovir (VALTREX) 1000 MG tablet  3 times daily     03/18/18 2045    HYDROcodone-acetaminophen (NORCO/VICODIN) 5-325 MG tablet  Every 6 hours PRN,   Status:  Discontinued     03/18/18 2045    naproxen (NAPROSYN) 375 MG tablet  2 times daily     03/18/18 2045    pregabalin (LYRICA) 50 MG capsule  3 times daily     03/18/18 2055           [name] [place], [place] 03/18/18 4436    [name], MD 03/18/18 2059

## 2018-03-18 NOTE — ED Provider Notes (Signed)
Medical screening examination/treatment/procedure(s) were conducted as a shared visit with non-physician practitioner(s) and myself.  I personally evaluated the patient during the encounter.  None 73 year old female here after being diagnosed with shingles.  Went over medications.  On exam her face has a rash consistent with that.  Will refill her meds and discharged home   [name], MD 03/18/18 2041

## 2018-03-21 DIAGNOSIS — B029 Zoster without complications: Secondary | ICD-10-CM | POA: Diagnosis not present

## 2018-03-21 DIAGNOSIS — H6120 Impacted cerumen, unspecified ear: Secondary | ICD-10-CM | POA: Diagnosis not present

## 2018-03-22 DIAGNOSIS — B0239 Other herpes zoster eye disease: Secondary | ICD-10-CM | POA: Diagnosis not present

## 2018-03-27 DIAGNOSIS — R69 Illness, unspecified: Secondary | ICD-10-CM | POA: Diagnosis not present

## 2018-03-27 DIAGNOSIS — M545 Low back pain: Secondary | ICD-10-CM | POA: Diagnosis not present

## 2018-03-27 DIAGNOSIS — B029 Zoster without complications: Secondary | ICD-10-CM | POA: Diagnosis not present

## 2018-03-27 DIAGNOSIS — F320 Major depressive disorder, single episode, mild: Secondary | ICD-10-CM | POA: Diagnosis not present

## 2018-03-27 DIAGNOSIS — M150 Primary generalized (osteo)arthritis: Secondary | ICD-10-CM | POA: Diagnosis not present

## 2018-04-03 ENCOUNTER — Telehealth (HOSPITAL_COMMUNITY): Payer: Self-pay

## 2018-04-03 NOTE — Telephone Encounter (Signed)
Called to schedule f/u mri, no answer, left vm. AW

## 2018-04-10 DIAGNOSIS — M1712 Unilateral primary osteoarthritis, left knee: Secondary | ICD-10-CM | POA: Diagnosis not present

## 2018-04-12 DIAGNOSIS — B0229 Other postherpetic nervous system involvement: Secondary | ICD-10-CM | POA: Diagnosis not present

## 2018-05-03 DIAGNOSIS — M1712 Unilateral primary osteoarthritis, left knee: Secondary | ICD-10-CM | POA: Diagnosis not present

## 2018-05-05 NOTE — Patient Instructions (Addendum)
ANGLA DELAHUNT  1945/08/15     Your procedure is scheduled on:  05-16-2018   Report to [facility] Main  Entrance,  Report to admitting at  7:30 AM    Call this number if you have problems the morning of surgery [phone]        Remember: Do not eat food or drink liquids :After Midnight. This includes no water, candy, gum, mints.  BRUSH YOUR TEETH MORNING OF SURGERY AND RINSE YOUR MOUTH OUT         Take these medicines the morning of surgery with A SIP OF WATER:  Pregabalin (lyrica),  Hydrocodone                                     You may not have any metal on your body including hair pins and               piercings  Do not wear jewelry, make-up, lotions, powders or perfumes, deodorant              Do not wear nail polish.  Do not shave  48 hours prior to surgery.                   Do not bring valuables to the hospital. [place].  Contacts, dentures or bridgework may not be worn into surgery.  Leave suitcase in the car. After surgery it may be brought to your room.      _____________________________________________________________________            [facility] - Preparing for Surgery Before surgery, you can play an important role.  Because skin is not sterile, your skin needs to be as free of germs as possible.  You can reduce the number of germs on your skin by washing with CHG (chlorahexidine gluconate) soap before surgery.  CHG is an antiseptic cleaner which kills germs and bonds with the skin to continue killing germs even after washing. Please DO NOT use if you have an allergy to CHG or antibacterial soaps.  If your skin becomes reddened/irritated stop using the CHG and inform your nurse when you arrive at Short Stay. Do not shave (including legs and underarms) for at least 48 hours prior to the first CHG shower.  You may shave your face/neck. Please follow these instructions  carefully:  1.  Shower with CHG Soap the night before surgery and the  morning of Surgery.  2.  If you choose to wash your hair, wash your hair first as usual with your  normal  shampoo.  3.  After you shampoo, rinse your hair and body thoroughly to remove the  shampoo.                            4.  Use CHG as you would any other liquid soap.  You can apply chg directly  to the skin and wash                       Gently with a scrungie or clean washcloth.  5.  Apply the CHG Soap to your body ONLY FROM THE  NECK DOWN.   Do not use on face/ open                           Wound or open sores. Avoid contact with eyes, ears mouth and genitals (private parts).                       Wash face,  Genitals (private parts) with your normal soap.             6.  Wash thoroughly, paying special attention to the area where your surgery  will be performed.  7.  Thoroughly rinse your body with warm water from the neck down.  8.  DO NOT shower/wash with your normal soap after using and rinsing off  the CHG Soap.             9.  Pat yourself dry with a clean towel.            10.  Wear clean pajamas.            11.  Place clean sheets on your bed the night of your first shower and do not  sleep with pets. Day of Surgery : Do not apply any lotions/deodorants the morning of surgery.  Please wear clean clothes to the hospital/surgery center.  FAILURE TO FOLLOW THESE INSTRUCTIONS MAY RESULT IN THE CANCELLATION OF YOUR SURGERY PATIENT SIGNATURE_________________________________  NURSE SIGNATURE__________________________________  ________________________________________________________________________

## 2018-05-08 ENCOUNTER — Other Ambulatory Visit: Payer: Self-pay | Admitting: Orthopedic Surgery

## 2018-05-09 ENCOUNTER — Encounter (HOSPITAL_COMMUNITY): Payer: Self-pay

## 2018-05-09 ENCOUNTER — Other Ambulatory Visit: Payer: Self-pay

## 2018-05-09 ENCOUNTER — Encounter (HOSPITAL_COMMUNITY)
Admission: RE | Admit: 2018-05-09 | Discharge: 2018-05-09 | Disposition: A | Discharge status: Home / Self Care | Payer: Medicare HMO | Source: Ambulatory Visit | Attending: Orthopedic Surgery | Admitting: Orthopedic Surgery

## 2018-05-09 DIAGNOSIS — Z01812 Encounter for preprocedural laboratory examination: Secondary | ICD-10-CM | POA: Insufficient documentation

## 2018-05-09 HISTORY — DX: Presence of spectacles and contact lenses: Z97.3

## 2018-05-09 HISTORY — DX: Cerebral aneurysm, nonruptured: I67.1

## 2018-05-09 LAB — BASIC METABOLIC PANEL
ANION GAP: 6 (ref 5–15)
BUN: 12 mg/dL (ref 8–23)
CALCIUM: 9.1 mg/dL (ref 8.9–10.3)
CO2: 30 mmol/L (ref 22–32)
Chloride: 101 mmol/L (ref 98–111)
Creatinine, Ser: 0.56 mg/dL (ref 0.44–1.00)
GFR calc non Af Amer: >60 mL/min (ref >60)
Glucose, Bld: 90 mg/dL (ref 70–99)
Potassium: 4.4 mmol/L (ref 3.5–5.1)
Sodium: 137 mmol/L (ref 135–145)

## 2018-05-09 LAB — CBC
HCT: 41.6 % (ref 36.0–46.0)
Hemoglobin: 13 g/dL (ref 12.0–15.0)
MCH: 31.4 pg (ref 26.0–34.0)
MCHC: 31.3 g/dL (ref 30.0–36.0)
MCV: 100.5 fL — AB (ref 80.0–100.0)
NRBC: 0 % (ref 0.0–0.2)
PLATELETS: 256 10*3/uL (ref 150–400)
RBC: 4.14 MIL/uL (ref 3.87–5.11)
RDW: 14.6 % (ref 11.5–15.5)
WBC: 5.4 10*3/uL (ref 4.0–10.5)

## 2018-05-09 LAB — SURGICAL PCR SCREEN
MRSA, PCR: NEGATIVE
Staphylococcus aureus: NEGATIVE

## 2018-05-11 ENCOUNTER — Other Ambulatory Visit: Payer: Self-pay | Admitting: Orthopedic Surgery

## 2018-05-12 ENCOUNTER — Other Ambulatory Visit: Payer: Self-pay | Admitting: Orthopedic Surgery

## 2018-05-12 NOTE — Care Plan (Signed)
Met with patient in the office for H&P visit. She is planning to discharge to home with family to assist. Rolling walker ordered. She is set up for OPPT to start at [facility] on 05/19/18.  She is aware and agreeable to plan. Choice offered.    Ladell Heads, [place]

## 2018-05-16 ENCOUNTER — Encounter (HOSPITAL_COMMUNITY): Payer: Self-pay | Admitting: Certified Registered Nurse Anesthetist

## 2018-05-16 ENCOUNTER — Ambulatory Visit (HOSPITAL_COMMUNITY): Payer: Medicare HMO | Admitting: Physician Assistant

## 2018-05-16 ENCOUNTER — Encounter (HOSPITAL_COMMUNITY)
Admission: RE | Disposition: A | Discharge status: Home / Self Care | Payer: Self-pay | Source: Ambulatory Visit | Attending: Orthopedic Surgery

## 2018-05-16 ENCOUNTER — Observation Stay (HOSPITAL_COMMUNITY): Payer: Medicare HMO

## 2018-05-16 ENCOUNTER — Ambulatory Visit (HOSPITAL_COMMUNITY): Payer: Medicare HMO | Admitting: Certified Registered Nurse Anesthetist

## 2018-05-16 ENCOUNTER — Observation Stay (HOSPITAL_COMMUNITY)
Admission: RE | Admit: 2018-05-16 | Discharge: 2018-05-17 | Disposition: A | Discharge status: Home / Self Care | Payer: Medicare HMO | Source: Ambulatory Visit | Attending: Orthopedic Surgery | Admitting: Orthopedic Surgery

## 2018-05-16 ENCOUNTER — Other Ambulatory Visit: Payer: Self-pay

## 2018-05-16 DIAGNOSIS — R69 Illness, unspecified: Secondary | ICD-10-CM | POA: Diagnosis not present

## 2018-05-16 DIAGNOSIS — F419 Anxiety disorder, unspecified: Secondary | ICD-10-CM | POA: Diagnosis not present

## 2018-05-16 DIAGNOSIS — K589 Irritable bowel syndrome without diarrhea: Secondary | ICD-10-CM | POA: Diagnosis not present

## 2018-05-16 DIAGNOSIS — E785 Hyperlipidemia, unspecified: Secondary | ICD-10-CM | POA: Insufficient documentation

## 2018-05-16 DIAGNOSIS — Z981 Arthrodesis status: Secondary | ICD-10-CM | POA: Diagnosis not present

## 2018-05-16 DIAGNOSIS — Z87891 Personal history of nicotine dependence: Secondary | ICD-10-CM | POA: Diagnosis not present

## 2018-05-16 DIAGNOSIS — Z7982 Long term (current) use of aspirin: Secondary | ICD-10-CM | POA: Diagnosis not present

## 2018-05-16 DIAGNOSIS — Z79899 Other long term (current) drug therapy: Secondary | ICD-10-CM | POA: Diagnosis not present

## 2018-05-16 DIAGNOSIS — M1712 Unilateral primary osteoarthritis, left knee: Secondary | ICD-10-CM | POA: Diagnosis not present

## 2018-05-16 DIAGNOSIS — Z471 Aftercare following joint replacement surgery: Secondary | ICD-10-CM | POA: Diagnosis not present

## 2018-05-16 DIAGNOSIS — Z96651 Presence of right artificial knee joint: Secondary | ICD-10-CM | POA: Insufficient documentation

## 2018-05-16 DIAGNOSIS — Z96652 Presence of left artificial knee joint: Secondary | ICD-10-CM

## 2018-05-16 DIAGNOSIS — Z791 Long term (current) use of non-steroidal anti-inflammatories (NSAID): Secondary | ICD-10-CM | POA: Insufficient documentation

## 2018-05-16 DIAGNOSIS — F329 Major depressive disorder, single episode, unspecified: Secondary | ICD-10-CM | POA: Insufficient documentation

## 2018-05-16 DIAGNOSIS — G8918 Other acute postprocedural pain: Secondary | ICD-10-CM | POA: Diagnosis not present

## 2018-05-16 HISTORY — DX: Unilateral primary osteoarthritis, left knee: M17.12

## 2018-05-16 HISTORY — DX: Presence of left artificial knee joint: Z96.652

## 2018-05-16 HISTORY — PX: PARTIAL KNEE ARTHROPLASTY: SHX2174

## 2018-05-16 SURGERY — ARTHROPLASTY, KNEE, UNICOMPARTMENTAL
Anesthesia: Monitor Anesthesia Care | Site: Knee | Laterality: Left

## 2018-05-16 MED ORDER — PHENYLEPHRINE 40 MCG/ML (10ML) SYRINGE FOR IV PUSH (FOR BLOOD PRESSURE SUPPORT)
PREFILLED_SYRINGE | INTRAVENOUS | Status: DC | PRN
  Administered 2018-05-16 (×2): 80 ug via INTRAVENOUS

## 2018-05-16 MED ORDER — ALUM & MAG HYDROXIDE-SIMETH 200-200-20 MG/5ML PO SUSP: 30.0000 mL | ORAL | Status: DC | PRN

## 2018-05-16 MED ORDER — MENTHOL 3 MG MT LOZG: 1.0000 | LOZENGE | OROMUCOSAL | Status: DC | PRN

## 2018-05-16 MED ORDER — FENTANYL CITRATE (PF) 100 MCG/2ML IJ SOLN: 25.0000 ug | INTRAMUSCULAR | Status: DC | PRN

## 2018-05-16 MED ORDER — METHOCARBAMOL 500 MG IVPB - SIMPLE MED
500.0000 mg | Freq: Four times a day (QID) | INTRAVENOUS | Status: DC | PRN
  Filled 2018-05-16: qty 50

## 2018-05-16 MED ORDER — PROPOFOL 500 MG/50ML IV EMUL
INTRAVENOUS | Status: DC | PRN
  Administered 2018-05-16: 100 ug/kg/min via INTRAVENOUS

## 2018-05-16 MED ORDER — FENTANYL CITRATE (PF) 100 MCG/2ML IJ SOLN
INTRAMUSCULAR | Status: AC
  Administered 2018-05-16: 50 ug
  Filled 2018-05-16: qty 2

## 2018-05-16 MED ORDER — CITALOPRAM HYDROBROMIDE 20 MG PO TABS
10.0000 mg | ORAL_TABLET | Freq: Every day | ORAL | Status: DC
  Filled 2018-05-16: qty 1

## 2018-05-16 MED ORDER — METOCLOPRAMIDE HCL 5 MG PO TABS: 5.0000 mg | ORAL_TABLET | Freq: Three times a day (TID) | ORAL | Status: DC | PRN

## 2018-05-16 MED ORDER — TRANEXAMIC ACID-NACL 1000-0.7 MG/100ML-% IV SOLN
1000.0000 mg | Freq: Once | INTRAVENOUS | Status: AC
  Administered 2018-05-16: 1000 mg via INTRAVENOUS
  Filled 2018-05-16: qty 100

## 2018-05-16 MED ORDER — METOCLOPRAMIDE HCL 5 MG/ML IJ SOLN: 5.0000 mg | Freq: Three times a day (TID) | INTRAMUSCULAR | Status: DC | PRN

## 2018-05-16 MED ORDER — OXYCODONE HCL 5 MG PO TABS: 5.0000 mg | ORAL_TABLET | Freq: Once | ORAL | Status: DC | PRN

## 2018-05-16 MED ORDER — IMIPRAMINE HCL 50 MG PO TABS
150.0000 mg | ORAL_TABLET | Freq: Every day | ORAL | Status: DC
  Filled 2018-05-16: qty 3

## 2018-05-16 MED ORDER — DIPHENHYDRAMINE HCL 12.5 MG/5ML PO ELIX: 12.5000 mg | ORAL_SOLUTION | ORAL | Status: DC | PRN

## 2018-05-16 MED ORDER — PROPOFOL 10 MG/ML IV BOLUS
INTRAVENOUS | Status: AC
  Filled 2018-05-16: qty 60

## 2018-05-16 MED ORDER — ASPIRIN EC 325 MG PO TBEC: 325.0000 mg | DELAYED_RELEASE_TABLET | Freq: Two times a day (BID) | ORAL | 0 refills | Status: DC

## 2018-05-16 MED ORDER — HYDROMORPHONE HCL 1 MG/ML IJ SOLN: 0.5000 mg | INTRAMUSCULAR | Status: DC | PRN

## 2018-05-16 MED ORDER — TRANEXAMIC ACID-NACL 1000-0.7 MG/100ML-% IV SOLN
INTRAVENOUS | Status: DC | PRN
  Administered 2018-05-16: 1000 mg via INTRAVENOUS

## 2018-05-16 MED ORDER — ASPIRIN EC 325 MG PO TBEC
325.0000 mg | DELAYED_RELEASE_TABLET | Freq: Two times a day (BID) | ORAL | Status: DC
  Administered 2018-05-16 – 2018-05-17 (×2): 325 mg via ORAL
  Filled 2018-05-16 (×2): qty 1

## 2018-05-16 MED ORDER — ACETAMINOPHEN 500 MG PO TABS
1000.0000 mg | ORAL_TABLET | Freq: Four times a day (QID) | ORAL | Status: AC
  Administered 2018-05-16 – 2018-05-17 (×4): 1000 mg via ORAL
  Filled 2018-05-16 (×4): qty 2

## 2018-05-16 MED ORDER — SODIUM CHLORIDE 0.9 % IR SOLN
Status: DC | PRN
  Administered 2018-05-16: 1000 mL

## 2018-05-16 MED ORDER — OXYCODONE HCL 5 MG/5ML PO SOLN: 5.0000 mg | Freq: Once | ORAL | Status: DC | PRN

## 2018-05-16 MED ORDER — ONDANSETRON HCL 4 MG/2ML IJ SOLN: 4.0000 mg | Freq: Four times a day (QID) | INTRAMUSCULAR | Status: DC | PRN

## 2018-05-16 MED ORDER — ONDANSETRON HCL 4 MG/2ML IJ SOLN: 4.0000 mg | Freq: Once | INTRAMUSCULAR | Status: DC | PRN

## 2018-05-16 MED ORDER — OXYCODONE HCL 5 MG PO TABS
5.0000 mg | ORAL_TABLET | ORAL | Status: DC | PRN
  Filled 2018-05-16: qty 2

## 2018-05-16 MED ORDER — PHENYLEPHRINE 40 MCG/ML (10ML) SYRINGE FOR IV PUSH (FOR BLOOD PRESSURE SUPPORT)
PREFILLED_SYRINGE | INTRAVENOUS | Status: AC
  Filled 2018-05-16: qty 10

## 2018-05-16 MED ORDER — MIDAZOLAM HCL 2 MG/2ML IJ SOLN
INTRAMUSCULAR | Status: AC
  Administered 2018-05-16: 1 mg
  Filled 2018-05-16: qty 2

## 2018-05-16 MED ORDER — CEFAZOLIN SODIUM-DEXTROSE 2-4 GM/100ML-% IV SOLN
2.0000 g | Freq: Four times a day (QID) | INTRAVENOUS | Status: AC
  Administered 2018-05-16 (×2): 2 g via INTRAVENOUS
  Filled 2018-05-16 (×2): qty 100

## 2018-05-16 MED ORDER — DOCUSATE SODIUM 100 MG PO CAPS
100.0000 mg | ORAL_CAPSULE | Freq: Two times a day (BID) | ORAL | Status: DC
  Administered 2018-05-16 – 2018-05-17 (×2): 100 mg via ORAL
  Filled 2018-05-16 (×2): qty 1

## 2018-05-16 MED ORDER — OXYCODONE HCL 5 MG PO TABS
10.0000 mg | ORAL_TABLET | ORAL | Status: DC | PRN
  Administered 2018-05-16 – 2018-05-17 (×4): 10 mg via ORAL
  Filled 2018-05-16 (×3): qty 2

## 2018-05-16 MED ORDER — MAGNESIUM CITRATE PO SOLN: 1.0000 | Freq: Once | ORAL | Status: DC | PRN

## 2018-05-16 MED ORDER — POTASSIUM CHLORIDE IN NACL 20-0.45 MEQ/L-% IV SOLN
INTRAVENOUS | Status: DC
  Administered 2018-05-16 – 2018-05-17 (×2): via INTRAVENOUS
  Filled 2018-05-16 (×2): qty 1000

## 2018-05-16 MED ORDER — CEFAZOLIN SODIUM-DEXTROSE 2-4 GM/100ML-% IV SOLN
2.0000 g | INTRAVENOUS | Status: AC
  Administered 2018-05-16: 2 g via INTRAVENOUS
  Filled 2018-05-16: qty 100

## 2018-05-16 MED ORDER — DEXAMETHASONE SODIUM PHOSPHATE 10 MG/ML IJ SOLN
10.0000 mg | Freq: Once | INTRAMUSCULAR | Status: DC
  Filled 2018-05-16: qty 1

## 2018-05-16 MED ORDER — FENTANYL CITRATE (PF) 100 MCG/2ML IJ SOLN
INTRAMUSCULAR | Status: DC | PRN
  Administered 2018-05-16: 100 ug via INTRAVENOUS

## 2018-05-16 MED ORDER — PREGABALIN 50 MG PO CAPS
50.0000 mg | ORAL_CAPSULE | Freq: Two times a day (BID) | ORAL | Status: DC
  Administered 2018-05-16 – 2018-05-17 (×2): 50 mg via ORAL
  Filled 2018-05-16 (×2): qty 1

## 2018-05-16 MED ORDER — 0.9 % SODIUM CHLORIDE (POUR BTL) OPTIME
TOPICAL | Status: DC | PRN
  Administered 2018-05-16: 1000 mL

## 2018-05-16 MED ORDER — BUPIVACAINE HCL 0.25 % IJ SOLN
INTRAMUSCULAR | Status: AC
  Filled 2018-05-16: qty 1

## 2018-05-16 MED ORDER — LACTATED RINGERS IV SOLN
INTRAVENOUS | Status: DC
  Administered 2018-05-16 (×2): via INTRAVENOUS

## 2018-05-16 MED ORDER — KETOROLAC TROMETHAMINE 30 MG/ML IJ SOLN
INTRAMUSCULAR | Status: DC | PRN
  Administered 2018-05-16: 30 mg via INTRAMUSCULAR

## 2018-05-16 MED ORDER — ONDANSETRON HCL 4 MG/2ML IJ SOLN
INTRAMUSCULAR | Status: DC | PRN
  Administered 2018-05-16: 4 mg via INTRAVENOUS

## 2018-05-16 MED ORDER — ALPRAZOLAM 1 MG PO TABS
1.0000 mg | ORAL_TABLET | Freq: Every day | ORAL | Status: DC
  Filled 2018-05-16: qty 1

## 2018-05-16 MED ORDER — PHENOL 1.4 % MT LIQD
1.0000 | OROMUCOSAL | Status: DC | PRN
  Filled 2018-05-16: qty 177

## 2018-05-16 MED ORDER — SENNA-DOCUSATE SODIUM 8.6-50 MG PO TABS: 2.0000 | ORAL_TABLET | Freq: Every day | ORAL | 1 refills | Status: DC

## 2018-05-16 MED ORDER — BUPIVACAINE HCL 0.25 % IJ SOLN
INTRAMUSCULAR | Status: DC | PRN
  Administered 2018-05-16: 30 mL

## 2018-05-16 MED ORDER — METHOCARBAMOL 500 MG PO TABS
500.0000 mg | ORAL_TABLET | Freq: Four times a day (QID) | ORAL | Status: DC | PRN
  Administered 2018-05-16 – 2018-05-17 (×2): 500 mg via ORAL
  Filled 2018-05-16 (×2): qty 1

## 2018-05-16 MED ORDER — ONDANSETRON HCL 4 MG PO TABS: 4.0000 mg | ORAL_TABLET | Freq: Three times a day (TID) | ORAL | 0 refills | Status: DC | PRN

## 2018-05-16 MED ORDER — BACLOFEN 10 MG PO TABS: 10.0000 mg | ORAL_TABLET | Freq: Three times a day (TID) | ORAL | 0 refills | Status: DC

## 2018-05-16 MED ORDER — ZOLPIDEM TARTRATE 5 MG PO TABS: 5.0000 mg | ORAL_TABLET | Freq: Every evening | ORAL | Status: DC | PRN

## 2018-05-16 MED ORDER — POLYETHYLENE GLYCOL 3350 17 G PO PACK: 17.0000 g | PACK | Freq: Every day | ORAL | Status: DC | PRN

## 2018-05-16 MED ORDER — BISACODYL 10 MG RE SUPP: 10.0000 mg | Freq: Every day | RECTAL | Status: DC | PRN

## 2018-05-16 MED ORDER — TRANEXAMIC ACID-NACL 1000-0.7 MG/100ML-% IV SOLN
INTRAVENOUS | Status: AC
  Filled 2018-05-16: qty 100

## 2018-05-16 MED ORDER — OXYCODONE HCL 5 MG PO TABS: 5.0000 mg | ORAL_TABLET | ORAL | 0 refills | Status: DC | PRN

## 2018-05-16 MED ORDER — FENTANYL CITRATE (PF) 100 MCG/2ML IJ SOLN
INTRAMUSCULAR | Status: AC
  Filled 2018-05-16: qty 2

## 2018-05-16 MED ORDER — KETOROLAC TROMETHAMINE 30 MG/ML IJ SOLN
INTRAMUSCULAR | Status: AC
  Filled 2018-05-16: qty 1

## 2018-05-16 MED ORDER — CEFAZOLIN SODIUM-DEXTROSE 2-4 GM/100ML-% IV SOLN: 2.0000 g | INTRAVENOUS | Status: DC

## 2018-05-16 MED ORDER — ACETAMINOPHEN 325 MG PO TABS: 325.0000 mg | ORAL_TABLET | Freq: Four times a day (QID) | ORAL | Status: DC | PRN

## 2018-05-16 MED ORDER — ONDANSETRON HCL 4 MG PO TABS: 4.0000 mg | ORAL_TABLET | Freq: Four times a day (QID) | ORAL | Status: DC | PRN

## 2018-05-16 MED ORDER — CHLORHEXIDINE GLUCONATE 4 % EX LIQD: 60.0000 mL | Freq: Once | CUTANEOUS | Status: DC

## 2018-05-16 SURGICAL SUPPLY — 65 items
BAG SPEC THK2 15X12 ZIP CLS (MISCELLANEOUS) ×1
BAG ZIPLOCK 12X15 (MISCELLANEOUS) ×2 IMPLANT
BANDAGE ESMARK 6X9 LF (GAUZE/BANDAGES/DRESSINGS) ×1 IMPLANT
BEARING MENISCAL TIBIAL 3 SM L (Orthopedic Implant) ×1 IMPLANT
BLADE SURG 15 STRL LF DISP TIS (BLADE) ×1 IMPLANT
BLADE SURG 15 STRL SS (BLADE) ×2
BNDG CMPR 9X6 STRL LF SNTH (GAUZE/BANDAGES/DRESSINGS) ×1
BNDG CMPR MED 15X6 ELC VLCR LF (GAUZE/BANDAGES/DRESSINGS) ×1
BNDG ELASTIC 6X15 VLCR STRL LF (GAUZE/BANDAGES/DRESSINGS) ×2 IMPLANT
BNDG ESMARK 6X9 LF (GAUZE/BANDAGES/DRESSINGS) ×2
BOWL SMART MIX CTS (DISPOSABLE) ×2 IMPLANT
BRNG TIB SM 3 PHS 3 LT MEN (Orthopedic Implant) ×1 IMPLANT
CEMENT BONE R 1X40 (Cement) ×2 IMPLANT
CLSR STERI-STRIP ANTIMIC 1/2X4 (GAUZE/BANDAGES/DRESSINGS) ×2 IMPLANT
COVER SURGICAL LIGHT HANDLE (MISCELLANEOUS) ×2 IMPLANT
COVER WAND RF STERILE (DRAPES) IMPLANT
CUFF TOURN SGL QUICK 34 (TOURNIQUET CUFF) ×2
CUFF TRNQT CYL 34X4.125X (TOURNIQUET CUFF) ×1 IMPLANT
DECANTER SPIKE VIAL GLASS SM (MISCELLANEOUS) ×1 IMPLANT
DRAPE EXTREMITY T 121X128X90 (DISPOSABLE) ×2 IMPLANT
DRAPE POUCH INSTRU U-SHP 10X18 (DRAPES) ×2 IMPLANT
DRAPE U-SHAPE 47X51 STRL (DRAPES) ×2 IMPLANT
DRSG MEPILEX BORDER 4X8 (GAUZE/BANDAGES/DRESSINGS) ×2 IMPLANT
DRSG PAD ABDOMINAL 8X10 ST (GAUZE/BANDAGES/DRESSINGS) ×2 IMPLANT
DURAPREP 26ML APPLICATOR (WOUND CARE) ×4 IMPLANT
ELECT REM PT RETURN 15FT ADLT (MISCELLANEOUS) ×2 IMPLANT
FACESHIELD WRAPAROUND (MASK) ×2 IMPLANT
FACESHIELD WRAPAROUND OR TEAM (MASK) ×1 IMPLANT
GLOVE BIOGEL PI IND STRL 8 (GLOVE) ×2 IMPLANT
GLOVE BIOGEL PI INDICATOR 8 (GLOVE) ×2
GLOVE ORTHO TXT STRL SZ7.5 (GLOVE) ×2 IMPLANT
GLOVE SURG ORTHO 8.0 STRL STRW (GLOVE) ×2 IMPLANT
GOWN STRL REUS W/TWL 2XL LVL3 (GOWN DISPOSABLE) ×2 IMPLANT
GOWN STRL REUS W/TWL LRG LVL3 (GOWN DISPOSABLE) ×2 IMPLANT
HANDPIECE INTERPULSE COAX TIP (DISPOSABLE) ×2
HOLDER FOLEY CATH W/STRAP (MISCELLANEOUS) ×1 IMPLANT
HOOD PEEL AWAY FLYTE STAYCOOL (MISCELLANEOUS) ×4 IMPLANT
IMMOBILIZER KNEE 20 (SOFTGOODS)
IMMOBILIZER KNEE 20 THIGH 36 (SOFTGOODS) IMPLANT
IMMOBILIZER KNEE 22 UNIV (SOFTGOODS) ×2 IMPLANT
KIT BASIN OR (CUSTOM PROCEDURE TRAY) ×2 IMPLANT
NDL SAFETY ECLIPSE 18X1.5 (NEEDLE) ×1 IMPLANT
NEEDLE HYPO 18GX1.5 SHARP (NEEDLE) ×2
NS IRRIG 1000ML POUR BTL (IV SOLUTION) ×2 IMPLANT
PACK BLADE SAW RECIP 70 3 PT (BLADE) ×1 IMPLANT
PACK ICE MAXI GEL EZY WRAP (MISCELLANEOUS) ×2 IMPLANT
PACK TOTAL JOINT (CUSTOM PROCEDURE TRAY) ×2 IMPLANT
PEG FEMORAL PEGGED STRL SM (Knees) ×1 IMPLANT
PROTECTOR NERVE ULNAR (MISCELLANEOUS) ×2 IMPLANT
SET HNDPC FAN SPRY TIP SCT (DISPOSABLE) ×1 IMPLANT
STRIP CLOSURE SKIN 1/2X4 (GAUZE/BANDAGES/DRESSINGS) ×1 IMPLANT
SUCTION FRAZIER HANDLE 12FR (TUBING) ×1
SUCTION TUBE FRAZIER 12FR DISP (TUBING) ×1 IMPLANT
SUT VIC AB 0 CT1 36 (SUTURE) ×2 IMPLANT
SUT VIC AB 2-0 CT1 27 (SUTURE) ×2
SUT VIC AB 2-0 CT1 TAPERPNT 27 (SUTURE) ×1 IMPLANT
SUT VIC AB 3-0 SH 8-18 (SUTURE) ×2 IMPLANT
SYR 20CC LL (SYRINGE) ×2 IMPLANT
SYR 30ML LL (SYRINGE) ×2 IMPLANT
SYR 3ML LL SCALE MARK (SYRINGE) ×2 IMPLANT
TOWEL OR 17X26 10 PK STRL BLUE (TOWEL DISPOSABLE) ×2 IMPLANT
TOWEL OR NON WOVEN STRL DISP B (DISPOSABLE) ×2 IMPLANT
TRAY FOLEY MTR SLVR 16FR STAT (SET/KITS/TRAYS/PACK) ×2 IMPLANT
TRAY TIBIAL KNEE OXFORD STD AA (Joint) ×1 IMPLANT
WATER STERILE IRR 1000ML POUR (IV SOLUTION) ×2 IMPLANT

## 2018-05-16 NOTE — Discharge Instructions (Signed)
INSTRUCTIONS AFTER JOINT REPLACEMENT   o Remove items at home which could result in a fall. This includes throw rugs or furniture in walking pathways o ICE to the affected joint every three hours while awake for 30 minutes at a time, for at least the first 3-5 days, and then as needed for pain and swelling.  Continue to use ice for pain and swelling. You may notice swelling that will progress down to the foot and ankle.  This is normal after surgery.  Elevate your leg when you are not up walking on it.   o Continue to use the breathing machine you got in the hospital (incentive spirometer) which will help keep your temperature down.  It is common for your temperature to cycle up and down following surgery, especially at night when you are not up moving around and exerting yourself.  The breathing machine keeps your lungs expanded and your temperature down.   DIET:  As you were doing prior to hospitalization, we recommend a well-balanced diet.  DRESSING / WOUND CARE / SHOWERING  You may change your dressing 3-5 days after surgery.  Then change the dressing every day with sterile gauze.  Please use good hand washing techniques before changing the dressing.  Do not use any lotions or creams on the incision until instructed by your surgeon.  ACTIVITY  o Increase activity slowly as tolerated, but follow the weight bearing instructions below.   o No driving for 6 weeks or until further direction given by your physician.  You cannot drive while taking narcotics.  o No lifting or carrying greater than 10 lbs. until further directed by your surgeon. o Avoid periods of inactivity such as sitting longer than an hour when not asleep. This helps prevent blood clots.  o You may return to work once you are authorized by your doctor.     WEIGHT BEARING   Weight bearing as tolerated with assist device (walker, cane, etc) as directed, use it as long as suggested by your surgeon or therapist, typically at  least 4-6 weeks.   EXERCISES  Results after joint replacement surgery are often greatly improved when you follow the exercise, range of motion and muscle strengthening exercises prescribed by your doctor. Safety measures are also important to protect the joint from further injury. Any time any of these exercises cause you to have increased pain or swelling, decrease what you are doing until you are comfortable again and then slowly increase them. If you have problems or questions, call your caregiver or physical therapist for advice.   Rehabilitation is important following a joint replacement. After just a few days of immobilization, the muscles of the leg can become weakened and shrink (atrophy).  These exercises are designed to build up the tone and strength of the thigh and leg muscles and to improve motion. Often times heat used for twenty to thirty minutes before working out will loosen up your tissues and help with improving the range of motion but do not use heat for the first two weeks following surgery (sometimes heat can increase post-operative swelling).   These exercises can be done on a training (exercise) mat, on the floor, on a table or on a bed. Use whatever works the best and is most comfortable for you.    Use music or television while you are exercising so that the exercises are a pleasant break in your day. This will make your life better with the exercises acting as a break  in your routine that you can look forward to.   Perform all exercises about fifteen times, three times per day or as directed.  You should exercise both the operative leg and the other leg as well.  Exercises include:    Quad Sets - Tighten up the muscle on the front of the thigh (Quad) and hold for 5-10 seconds.    Straight Leg Raises - With your knee straight (if you were given a brace, keep it on), lift the leg to 60 degrees, hold for 3 seconds, and slowly lower the leg.  Perform this exercise against  resistance later as your leg gets stronger.   Leg Slides: Lying on your back, slowly slide your foot toward your buttocks, bending your knee up off the floor (only go as far as is comfortable). Then slowly slide your foot back down until your leg is flat on the floor again.   Angel Wings: Lying on your back spread your legs to the side as far apart as you can without causing discomfort.   Hamstring Strength:  Lying on your back, push your heel against the floor with your leg straight by tightening up the muscles of your buttocks.  Repeat, but this time bend your knee to a comfortable angle, and push your heel against the floor.  You may put a pillow under the heel to make it more comfortable if necessary.   A rehabilitation program following joint replacement surgery can speed recovery and prevent re-injury in the future due to weakened muscles. Contact your doctor or a physical therapist for more information on knee rehabilitation.    CONSTIPATION  Constipation is defined medically as fewer than three stools per week and severe constipation as less than one stool per week.  Even if you have a regular bowel pattern at home, your normal regimen is likely to be disrupted due to multiple reasons following surgery.  Combination of anesthesia, postoperative narcotics, change in appetite and fluid intake all can affect your bowels.   YOU MUST use at least one of the following options; they are listed in order of increasing strength to get the job done.  They are all available over the counter, and you may need to use some, POSSIBLY even all of these options:    Drink plenty of fluids (prune juice may be helpful) and high fiber foods Colace 100 mg by mouth twice a day  Senokot for constipation as directed and as needed Dulcolax (bisacodyl), take with full glass of water  Miralax (polyethylene glycol) once or twice a day as needed.  If you have tried all these things and are unable to have a bowel  movement in the first 3-4 days after surgery call either your surgeon or your primary doctor.    If you experience loose stools or diarrhea, hold the medications until you stool forms back up.  If your symptoms do not get better within 1 week or if they get worse, check with your doctor.  If you experience "the worst abdominal pain ever" or develop nausea or vomiting, please contact the office immediately for further recommendations for treatment.   ITCHING:  If you experience itching with your medications, try taking only a single pain pill, or even half a pain pill at a time.  You can also use Benadryl over the counter for itching or also to help with sleep.   TED HOSE STOCKINGS:  Use stockings on both legs until for at least 2 weeks or as  directed by physician office. They may be removed at night for sleeping.  MEDICATIONS:  See your medication summary on the After Visit Summary that nursing will review with you.  You may have some home medications which will be placed on hold until you complete the course of blood thinner medication.  It is important for you to complete the blood thinner medication as prescribed.  PRECAUTIONS:  If you experience chest pain or shortness of breath - call 911 immediately for transfer to the hospital emergency department.   If you develop a fever greater that 101 F, purulent drainage from wound, increased redness or drainage from wound, foul odor from the wound/dressing, or calf pain - CONTACT YOUR SURGEON.                                                   FOLLOW-UP APPOINTMENTS:  If you do not already have a post-op appointment, please call the office for an appointment to be seen by your surgeon.  Guidelines for how soon to be seen are listed in your After Visit Summary, but are typically between 1-4 weeks after surgery.  OTHER INSTRUCTIONS:   Knee Replacement:  Do not place pillow under knee, focus on keeping the knee straight while resting. CPM  instructions: 0-90 degrees, 2 hours in the morning, 2 hours in the afternoon, and 2 hours in the evening. Place foam block, curve side up under heel at all times except when in CPM or when walking.  DO NOT modify, tear, cut, or change the foam block in any way.  MAKE SURE YOU:   Understand these instructions.   Get help right away if you are not doing well or get worse.    Thank you for letting us be a part of your medical care team.  It is a privilege we respect greatly.  We hope these instructions will help you stay on track for a fast and full recovery!

## 2018-05-16 NOTE — Anesthesia Procedure Notes (Signed)
Spinal  Start time: 05/16/2018 10:50 AM End time: 05/16/2018 10:55 AM Staffing Anesthesiologist: Roberts Gaudy, MD Performed: anesthesiologist  Preanesthetic Checklist Completed: patient identified, site marked, surgical consent, pre-op evaluation, timeout performed, IV checked, risks and benefits discussed and monitors and equipment checked Spinal Block Patient position: sitting Prep: ChloraPrep Patient monitoring: heart rate and continuous pulse ox Approach: midline Location: L2-3 Injection technique: single-shot Needle Needle type: Pencan  Needle gauge: 24 G Additional Notes 1.8 cc 0.75% Bupivacaine injected easily

## 2018-05-16 NOTE — Anesthesia Procedure Notes (Signed)
Anesthesia Regional Block: Adductor canal block   Pre-Anesthetic Checklist: ,, timeout performed, Correct Patient, Correct Site, Correct Laterality, Correct Procedure, Correct Position, site marked, Risks and benefits discussed, pre-op evaluation,  At surgeon's request and post-op pain management  Laterality: Left  Prep: Maximum Sterile Barrier Precautions used, chloraprep       Needles:  Injection technique: Single-shot  Needle Type: Echogenic Stimulator Needle     Needle Length: 9cm  Needle Gauge: 21     Additional Needles:   Procedures:,,,, ultrasound used (permanent image in chart),,,,  Narrative:  Start time: 05/16/2018 9:20 AM End time: 05/16/2018 9:25 AM Injection made incrementally with aspirations every 5 mL.  Performed by: Personally  Anesthesiologist: Roberts Gaudy, MD  Additional Notes: 20 cc 0.75% Ropivacaine injected easily

## 2018-05-16 NOTE — Anesthesia Postprocedure Evaluation (Signed)
Anesthesia Post Note  Patient: Cassandra Alexander  Procedure(s) Performed: UNICOMPARTMENTAL KNEE (Left Knee)     Patient location during evaluation: PACU Anesthesia Type: MAC Level of consciousness: oriented and awake and alert Pain management: pain level controlled Vital Signs Assessment: post-procedure vital signs reviewed and stable Respiratory status: spontaneous breathing, respiratory function stable and patient connected to nasal cannula oxygen Cardiovascular status: blood pressure returned to baseline and stable Postop Assessment: no headache, no backache and no apparent nausea or vomiting Anesthetic complications: no    Last Vitals:  Vitals:   05/16/18 1400 05/16/18 1415  BP: 136/71 127/78  Pulse:  67  Resp: 11 11  Temp:    SpO2: 100% 100%    Last Pain:  Vitals:   05/16/18 1415  TempSrc:   PainSc: 0-No pain    LLE Motor Response: Purposeful movement;Responds to commands (05/16/18 1415) LLE Sensation: Decreased (05/16/18 1415) RLE Motor Response: Purposeful movement;Responds to commands (05/16/18 1415) RLE Sensation: Decreased (05/16/18 1415) L Sensory Level: S1-Sole of foot, small toes (05/16/18 1415) R Sensory Level: S1-Sole of foot, small toes (05/16/18 1415)  Leonarda Leis COKER

## 2018-05-16 NOTE — Evaluation (Signed)
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  Pt should continue to progress well with mobility--amb ~ 30' with min guard today; pt with multiple questions and anxiety about d/c plan, anxious about getting up flight of stairs to second level and not having HHPT (she is set up for OPPT); defer to CM. She will need a petite ht RW.   Pt will benefit from skilled PT to increase their independence and safety with mobility to allow discharge to the venue listed below.      Follow Up Recommendations Follow surgeon's recommendation for DC plan and follow-up therapies    Equipment Recommendations  Rolling walker with 5" wheels(petite ht RW)    Recommendations for Other Services       Precautions / Restrictions Precautions Precautions: Knee Required Braces or Orthoses: Knee Immobilizer - Left Restrictions Weight Bearing Restrictions: No Other Position/Activity Restrictions: WBAT      Mobility  Bed Mobility Overal bed mobility: Needs Assistance Bed Mobility: Supine to Sit     Supine to sit: Min assist     General bed mobility comments: wtih LLE  Transfers Overall transfer level: Needs assistance Equipment used: Rolling walker (2 wheeled) Transfers: Sit to/from Stand Sit to Stand: Min assist         General transfer comment: cues for safety and hand placement  Ambulation/Gait Ambulation/Gait assistance: Min assist Gait Distance (Feet): 30 Feet Assistive device: Rolling walker (2 wheeled) Gait Pattern/deviations: Step-to pattern;Decreased weight shift to left     General Gait Details: cues for sequence  Stairs            Wheelchair Mobility    Modified Rankin (Stroke Patients Only)       Balance                                             Pertinent Vitals/Pain Pain Assessment: 0-10 Pain Score: 8  Pain Location: L knee Pain Descriptors / Indicators: Aching;Grimacing;Guarding;Sore Pain Intervention(s): Limited activity within patient's tolerance;Monitored during session;Ice applied;Repositioned    Home Living Family/patient expects to be discharged to:: Private residence Living Arrangements: Spouse/significant other Available Help at Discharge: Family;Available PRN/intermittently Type of Home: House Home Access: Level entry     Home Layout: Two level   Additional Comments: pt states the walker she has is very old, belonged to her mother     Prior Function Level of Independence: Independent               Hand Dominance        Extremity/Trunk Assessment   Upper Extremity Assessment Upper Extremity Assessment: Overall WFL for tasks assessed    Lower Extremity Assessment Lower Extremity Assessment: LLE deficits/detail LLE Deficits / Details: ankle WFL, knee extension and hip flexion 2/5, limited by post op pain and weakness       Communication   Communication: No difficulties  Cognition Arousal/Alertness: Awake/alert Behavior During Therapy: WFL for tasks assessed/performed Overall Cognitive Status: Within Functional Limits for tasks assessed  General Comments      Exercises Total Joint Exercises Ankle Circles/Pumps: AROM;Both;10 reps Quad Sets: AROM;5 reps;Both Goniometric ROM: grossly 5* to 50* AAROM left knee flexion   Assessment/Plan    PT Assessment Patient needs continued PT services  PT Problem List Decreased strength;Decreased mobility;Decreased activity tolerance;Decreased knowledge of use of DME;Pain;Decreased range of motion       PT Treatment Interventions Stair training;Gait training;DME instruction;Functional mobility training;Therapeutic activities;Patient/family  education;Therapeutic exercise    PT Goals (Current goals can be found in the Care Plan section)  Acute Rehab PT Goals Patient Stated Goal: be able to go upstairs PT Goal Formulation: With patient Time For Goal Achievement: 05/23/18 Potential to Achieve Goals: Good    Frequency 7X/week   Barriers to discharge        Co-evaluation               AM-PAC PT "6 Clicks" Mobility  Outcome Measure Help needed turning from your back to your side while in a flat bed without using bedrails?: A Little Help needed moving from lying on your back to sitting on the side of a flat bed without using bedrails?: A Little Help needed moving to and from a bed to a chair (including a wheelchair)?: A Little Help needed standing up from a chair using your arms (e.g., wheelchair or bedside chair)?: A Little Help needed to walk in hospital room?: A Little Help needed climbing 3-5 steps with a railing? : A Lot 6 Click Score: 17    End of Session Equipment Utilized During Treatment: Gait belt;Left knee immobilizer Activity Tolerance: Patient tolerated treatment well Patient left: in chair;with call bell/phone within reach;with chair alarm set   PT Visit Diagnosis: Difficulty in walking, not elsewhere classified (R26.2)    Time: 7116-5790 PT Time Calculation (min) (ACUTE ONLY): 16 min   Charges:   PT Evaluation $PT Eval Low Complexity: 1 Low          Kenyon Ana, PT  Pager: [phone] Acute Rehab Dept [facility]): [phone]   05/16/2018   [facility] 05/16/2018, 6:31 PM

## 2018-05-16 NOTE — Anesthesia Preprocedure Evaluation (Signed)
Anesthesia Evaluation  Patient identified by MRN, date of birth, ID band Patient awake    Reviewed: Allergy & Precautions, NPO status , Patient's Chart, lab work & pertinent test results  Airway Mallampati: II  TM Distance: >3 FB Neck ROM: Full    Dental  (+) Teeth Intact, Dental Advisory Given   Pulmonary former smoker,    breath sounds clear to auscultation       Cardiovascular  Rhythm:Regular Rate:Normal     Neuro/Psych    GI/Hepatic   Endo/Other    Renal/GU      Musculoskeletal   Abdominal   Peds  Hematology   Anesthesia Other Findings   Reproductive/Obstetrics                             Anesthesia Physical Anesthesia Plan  ASA: II  Anesthesia Plan: MAC and Spinal   Post-op Pain Management:    Induction:   PONV Risk Score and Plan: Ondansetron and Dexamethasone  Airway Management Planned: Simple Face Mask and Natural Airway  Additional Equipment:   Intra-op Plan:   Post-operative Plan:   Informed Consent: I have reviewed the patients History and Physical, chart, labs and discussed the procedure including the risks, benefits and alternatives for the proposed anesthesia with the patient or authorized representative who has indicated his/her understanding and acceptance.     Dental advisory given  Plan Discussed with: CRNA and Anesthesiologist  Anesthesia Plan Comments:         Anesthesia Quick Evaluation

## 2018-05-16 NOTE — Care Plan (Signed)
Ortho Bundle Case Management Note  Patient Details  Name: Cassandra Alexander MRN: [id number] Date of Birth: Jan 31, 1946    Met with patient in the office for H&P visit. She is planning to discharge to home with family to assist. Rolling walker ordered. She is set up for OPPT to start at [facility] on 05/19/18.  She is aware and agreeable to plan. Choice offered.                DME Arranged:  [place] Rile rolling DME Agency:  [employer]  HH Arranged:    [place] Agency:     Additional Comments: Please contact me with any questions of if this plan should need to change.  Ladell Heads,  [place]  [phone] 05/16/2018, 9:19 AM

## 2018-05-16 NOTE — H&P (Signed)
PREOPERATIVE H&P  Chief Complaint: Left knee primary localized osteoarthritis  HPI: Cassandra Alexander is a 73 y.o. female who presents for preoperative history and physical with a diagnosis of left knee primary localized osteoarthritis. Symptoms are rated as moderate to severe, and have been worsening.  This is significantly impairing activities of daily living.  She has elected for surgical management.   She has failed injections, activity modification, anti-inflammatories, and assistive devices.  Preoperative X-rays demonstrate end stage degenerative changes with osteophyte formation, loss of joint space, subchondral sclerosis.   Past Medical History:  Diagnosis Date  . Anxiety   . Cerebral aneurysm 2006   s/p  coiling right side  ("find during an MRI for headaches")   PCOM  . Depression   . Diverticula of colon   . Hyperlipidemia   . IBS (irritable bowel syndrome)   . Osteoarthritis    knees, back, hands, hips  . Wears glasses    Past Surgical History:  Procedure Laterality Date  . ANEURYSM COILING  2006 @[facility]    right PCOM  . CESAREAN SECTION  x2  last one  1970s  . COLONOSCOPY    . LUMBAR FUSION  2017   --  dr Trenton Gammon per pt  . RECTAL PROLAPSE REPAIR  2004 approx.  @[facility]   . TONSILLECTOMY AND ADENOIDECTOMY  age 55  . TOTAL KNEE ARTHROPLASTY Right early 2000s  . VAGINAL HYSTERECTOMY  eary age 89s   ovaries remain   Social History   Socioeconomic History  . Marital status: Married    Spouse name: Not on file  . Number of children: Not on file  . Years of education: Not on file  . Highest education level: Not on file  Occupational History  . Not on file  Social Needs  . Financial resource strain: Not on file  . Food insecurity:    Worry: Not on file    Inability: Not on file  . Transportation needs:    Medical: Not on file    Non-medical: Not on file  Tobacco Use  . Smoking status: Former Smoker    Years: 20.00    Types: Cigarettes    Last attempt to quit:  03/15/1990    Years since quitting: 28.1  . Smokeless tobacco: Never Used  Substance and Sexual Activity  . Alcohol use: Not Currently  . Drug use: Never  . Sexual activity: Not on file  Lifestyle  . Physical activity:    Days per week: Not on file    Minutes per session: Not on file  . Stress: Not on file  Relationships  . Social connections:    Talks on phone: Not on file    Gets together: Not on file    Attends religious service: Not on file    Active member of club or organization: Not on file    Attends meetings of clubs or organizations: Not on file    Relationship status: Not on file  Other Topics Concern  . Not on file  Social History Narrative  . Not on file   History reviewed. No pertinent family history. Allergies  Allergen Reactions  . Cortisone Swelling    Caused facial swelling and headache after receiving cortisone injection  . Penicillins Rash    Childhood reaction Has patient had a PCN reaction causing immediate rash, facial/tongue/throat swelling, SOB or lightheadedness with hypotension: Yes Has patient had a PCN reaction causing severe rash involving mucus membranes or skin necrosis: No Has patient had a  PCN reaction that required hospitalization No Has patient had a PCN reaction occurring within the last 10 years: No If all of the above answers are "NO", then may proceed with Cephalosporin use.    Prior to Admission medications   Medication Sig Start Date End Date Taking? Authorizing Provider  ALPRAZolam (XANAX) 1 MG tablet TAKE 1 TABLET BY MOUTH THREE TIMES DAILY AS NEEDED Patient taking differently: Take 1-1.5 mg by mouth at bedtime.  12/24/10  Yes Hoover Browns., MD  aspirin EC 81 MG tablet Take 81 mg by mouth at bedtime.   Yes [provider]  citalopram (CELEXA) 10 MG tablet Take 10 mg by mouth at bedtime.   Yes [provider]  HYDROcodone-acetaminophen (NORCO) 10-325 MG tablet Take 1 tablet by mouth 3 (three) times  daily.   Yes [provider]  imipramine (TOFRANIL) 50 MG tablet TAKE 3 TABLETS AT BEDTIME Patient taking differently: Take 150 mg by mouth at bedtime.  06/08/10  Yes Hoover Browns., MD  pregabalin (LYRICA) 50 MG capsule Take 1 capsule (50 mg total) by mouth 3 (three) times daily. Patient taking differently: Take 50 mg by mouth 2 (two) times daily.  03/18/18  Yes Neese, [employer], NP  naproxen (NAPROSYN) 375 MG tablet Take 1 tablet (375 mg total) by mouth 2 (two) times daily. Patient not taking: Reported on 05/01/2018 03/18/18   Ashley Murrain, NP  valACYclovir (VALTREX) 1000 MG tablet Take 1 tablet (1,000 mg total) by mouth 3 (three) times daily. Patient not taking: Reported on 05/01/2018 03/18/18   Ashley Murrain, NP     Positive ROS: All other systems have been reviewed and were otherwise negative with the exception of those mentioned in the HPI and as above.  Physical Exam: General: Alert, no acute distress Cardiovascular: No pedal edema Respiratory: No cyanosis, no use of accessory musculature GI: No organomegaly, abdomen is soft and non-tender Skin: No lesions in the area of chief complaint Neurologic: Sensation intact distally Psychiatric: Patient is competent for consent with normal mood and affect Lymphatic: No axillary or cervical lymphadenopathy  MUSCULOSKELETAL: Left knee has varus alignment with crepitance and pseudolaxity and painful arc of motion 0 to 125 degrees.  Assessment: Left knee anteromedial osteoarthritis   Plan: Plan for Procedure(s): UNICOMPARTMENTAL KNEE  The risks benefits and alternatives were discussed with the patient including but not limited to the risks of nonoperative treatment, versus surgical intervention including infection, bleeding, nerve injury,  blood clots, cardiopulmonary complications, morbidity, mortality, among others, and they were willing to proceed.    Patient's anticipated LOS is less than 2 midnights, meeting these  requirements: - Younger than 10 - Lives within 1 hour of care - Has a competent adult at home to recover with post-op recover - NO history of  - Chronic pain requiring opiods  - Diabetes  - Coronary Artery Disease  - Heart failure  - Heart attack  - Stroke  - DVT/VTE  - Cardiac arrhythmia  - Respiratory Failure/COPD  - Renal failure  - Anemia  - Advanced Liver disease        Johnny Bridge, MD Cell [phone]   05/16/2018 10:00 AM

## 2018-05-16 NOTE — Progress Notes (Signed)
AssistedDr. Linna Caprice with left, ultrasound guided, adductor canal block. Side rails up, monitors on throughout procedure. See vital signs in flow sheet. Tolerated Procedure well.

## 2018-05-16 NOTE — Transfer of Care (Signed)
Immediate Anesthesia Transfer of Care Note  Patient: Cassandra Alexander  Procedure(s) Performed: UNICOMPARTMENTAL KNEE (Left Knee)  Patient Location: PACU  Anesthesia Type:Spinal  Level of Consciousness: awake, alert  and oriented  Airway & Oxygen Therapy: Patient Spontanous Breathing and Patient connected to face mask oxygen  Post-op Assessment: Report given to RN and Post -op Vital signs reviewed and stable  Post vital signs: Reviewed and stable  Last Vitals:  Vitals Value Taken Time  BP    Temp    Pulse 62 05/16/2018 12:45 PM  Resp 14 05/16/2018 12:45 PM  SpO2 89 % 05/16/2018 12:45 PM  Vitals shown include unvalidated device data.  Last Pain:  Vitals:   05/16/18 0815  TempSrc: (P) Oral      Patients Stated Pain Goal: 2 (24/79/98 0012)  Complications: No apparent anesthesia complications

## 2018-05-16 NOTE — Op Note (Signed)
05/16/2018  12:16 PM  PATIENT:  Cassandra Alexander    PRE-OPERATIVE DIAGNOSIS: Left knee primary localized osteoarthritis  POST-OPERATIVE DIAGNOSIS:  Same  PROCEDURE: Left unicompartmental Knee Arthroplasty  SURGEON:  Johnny Bridge, MD  PHYSICIAN ASSISTANT: Joya Gaskins, OPA-C, present and scrubbed throughout the case, critical for completion in a timely fashion, and for retraction, instrumentation, and closure.  ANESTHESIA:   Spinal with intra-articular block and adductor canal block  ESTIMATED BLOOD LOSS: 100 mL  UNIQUE ASPECTS OF THE CASE: The tibia was very small, despite having a minimal bone resection, the AA was slightly overhung front to back.  I placed in center, just adjacent to the anterior portion, and then attempt to minimize risk for posterior wall blowout.  The medial meniscus was resected anteriorly, but there was really no remaining meniscus posteriorly to resect.  It was more of a patulous capsule.  The patellofemoral joint and lateral compartment were pristine.  She was a fairly tight 3 throughout the operation, after the implants were in she was slightly more loose with the 3 in flexion, but probably between a 3 and a 4, but the 4 was too tight in extension, so I went with a 3, which appeared to be stable, with no evidence for instability in deep flexion.  PREOPERATIVE INDICATIONS:  Cassandra Alexander is a  73 y.o. female with a diagnosis of djd left knee who failed conservative measures and elected for surgical management.    The risks benefits and alternatives were discussed with the patient preoperatively including but not limited to the risks of infection, bleeding, nerve injury, cardiopulmonary complications, blood clots, the need for revision surgery, among others, and the patient was willing to proceed.  OPERATIVE IMPLANTS: Biomet [place] mobile bearing medial compartment arthroplasty femur size small, tibia size AA, bearing size 3.  OPERATIVE FINDINGS: Endstage grade 4  medial compartment osteoarthritis. No significant changes in the lateral or patellofemoral joint.  The ACL was intact.  OPERATIVE PROCEDURE: The patient was brought to the operating room placed in the supine position. General anesthesia was administered. IV antibiotics were given. The lower extremity was placed in the legholder and prepped and draped in usual sterile fashion.  Time out was performed.  The leg was elevated and exsanguinated and the tourniquet was inflated. Anteromedial incision was performed, and I took care to preserve the MCL. Parapatellar incision was carried out, and the osteophytes were excised, along with the medial meniscus and a small portion of the fat pad.  The extra medullary tibial cutting jig was applied, using the spoon and the 57m G-Clamp and the 2 mm shim, and I took care to protect the anterior cruciate ligament insertion and the tibial spine. The medial collateral ligament was also protected, and I resected my proximal tibia, matching the anatomic slope.   The proximal tibial bony cut was removed in one piece, and I turned my attention to the femur.  The intramedullary femoral rod was placed using the drill, and then using the appropriate reference, I assembled the femoral jig, setting my posterior cutting block. I resected my posterior femur, used the 0 spigot for the anterior femur, and then measured my gap.   I then used the appropriate mill to match the extension gap to the flexion gap. The second milling was at a 3 and then 4.  The gaps were then measured again with the appropriate feeler gauges. Once I had balanced flexion and extension gaps, I then completed the preparation of the  femur.  I milled off the anterior aspect of the distal femur to prevent impingement. I also exposed the tibia, and selected the above-named component, and then used the cutting jig to prepare the keel slot on the tibia. I also used the awl to curette out the bone to complete the  preparation of the keel. The back wall was intact.  I then placed trial components, and it was found to have excellent motion, and appropriate balance.  I then cemented the components into place, cementing the tibia first, removing all excess cement, and then cementing the femur.  All loose cement was removed.  The real polyethylene insert was applied manually, and the knee was taken through functional range of motion, and found to have excellent stability and restoration of joint motion, with excellent balance.  The wounds were irrigated copiously, and the parapatellar tissue closed with Vicryl, followed by Vicryl for the subcutaneous tissue, with routine closure with Steri-Strips and sterile gauze.  The tourniquet was released, and the patient was awakened and extubated and returned to PACU in stable and satisfactory condition. There were no complications.

## 2018-05-16 NOTE — Progress Notes (Signed)
This writer was about to give pt her 22:00 scheduled medications when pt informed me that she had already taken some of her medications from home which included alprazolam, citalopram, and imipramine. Educated pt on not taking any medications not given to her by nursing staff. Will continue to monitor pt.

## 2018-05-17 ENCOUNTER — Encounter (HOSPITAL_COMMUNITY): Payer: Self-pay | Admitting: Orthopedic Surgery

## 2018-05-17 DIAGNOSIS — Z96652 Presence of left artificial knee joint: Secondary | ICD-10-CM | POA: Diagnosis not present

## 2018-05-17 DIAGNOSIS — M1712 Unilateral primary osteoarthritis, left knee: Secondary | ICD-10-CM | POA: Diagnosis not present

## 2018-05-17 LAB — BASIC METABOLIC PANEL
Anion gap: 7 (ref 5–15)
BUN: 9 mg/dL (ref 8–23)
CO2: 27 mmol/L (ref 22–32)
Calcium: 8.4 mg/dL — ABNORMAL LOW (ref 8.9–10.3)
Chloride: 100 mmol/L (ref 98–111)
Creatinine, Ser: 0.54 mg/dL (ref 0.44–1.00)
GFR calc Af Amer: >60 mL/min (ref >60)
GFR calc non Af Amer: >60 mL/min (ref >60)
Glucose, Bld: 104 mg/dL — ABNORMAL HIGH (ref 70–99)
Potassium: 3.9 mmol/L (ref 3.5–5.1)
Sodium: 134 mmol/L — ABNORMAL LOW (ref 135–145)

## 2018-05-17 LAB — CBC
HCT: 40.3 % (ref 36.0–46.0)
Hemoglobin: 12.2 g/dL (ref 12.0–15.0)
MCH: 31 pg (ref 26.0–34.0)
MCHC: 30.3 g/dL (ref 30.0–36.0)
MCV: 102.5 fL — ABNORMAL HIGH (ref 80.0–100.0)
Platelets: 224 10*3/uL (ref 150–400)
RBC: 3.93 MIL/uL (ref 3.87–5.11)
RDW: 14 % (ref 11.5–15.5)
WBC: 6.9 10*3/uL (ref 4.0–10.5)
nRBC: 0 % (ref 0.0–0.2)

## 2018-05-17 MED ORDER — TRAMADOL HCL 50 MG PO TABS: 50.0000 mg | ORAL_TABLET | Freq: Four times a day (QID) | ORAL | 0 refills | Status: AC | PRN

## 2018-05-17 NOTE — Care Management Note (Signed)
Case Management Note  Patient Details  Name: Cassandra Alexander MRN: [id number] Date of Birth: [birth year]  Subjective/Objective:                  DISCHARGE PLANNING  Action/Plan: HHC- PT WISHES TO ARRANGE FOR OOPT DUE TO TIMING WITH HUSBANDS JOB DME-3 IN 1 AND ROLLING [facility]ALK[facility] VIA [place] Expected Discharge Date:  05/17/18               Expected Discharge Plan:  Home/Self Care  In-House Referral:     Discharge planning Services  CM Consult  Post Acute Care Choice:  Durable Medical Equipment, Home Health Choice offered to:  Patient  DME Arranged:  [place] rolling DME Agency:  [employer]  HH Arranged:  PT [place] Agency:  Other - See comment  Status of Service:  Completed, signed off  If discussed at [place] of Stay Meetings, dates discussed:    Additional Comments:  Leeroy Cha, RN 05/17/2018, 9:45 AM

## 2018-05-17 NOTE — Care Plan (Signed)
Patient seen on rounds today. She requested that her OPPT facility be moved to [facility] if possible. It is closer to her home and easier for her to get transportation to and from.  I have made arrangements for this and updated her AVS.    Ladell Heads, [place]

## 2018-05-17 NOTE — Discharge Summary (Addendum)
Physician Discharge Summary  Patient ID: Cassandra Alexander MRN: [id number] DOB/AGE: 1945/10/03 73 y.o.  Admit date: 05/16/2018 Discharge date: 05/17/2018  Admission Diagnoses:  Primary localized osteoarthritis of left knee  Discharge Diagnoses:  Principal Problem:   Primary localized osteoarthritis of left knee Active Problems:   S/P left unicompartmental knee replacement   Past Medical History:  Diagnosis Date  . Anxiety   . Cerebral aneurysm 2006   s/p  coiling right side  ("find during an MRI for headaches")   PCOM  . Depression   . Diverticula of colon   . Hyperlipidemia   . IBS (irritable bowel syndrome)   . Osteoarthritis    knees, back, hands, hips  . Primary localized osteoarthritis of left knee 05/16/2018  . Wears glasses     Surgeries: Procedure(s): UNICOMPARTMENTAL KNEE on 05/16/2018   Consultants (if any):   Discharged Condition: Improved  Hospital Course: Cassandra Alexander is an 73 y.o. female who was admitted 05/16/2018 with a diagnosis of Primary localized osteoarthritis of left knee and went to the operating room on 05/16/2018 and underwent the above named procedures.    She was given perioperative antibiotics:  Anti-infectives (From admission, onward)   Start     Dose/Rate Route Frequency Ordered Stop   05/16/18 1700  ceFAZolin (ANCEF) IVPB 2g/100 mL premix     2 g 200 mL/hr over 30 Minutes Intravenous Every 6 hours 05/16/18 1434 05/16/18 2344   05/16/18 0800  ceFAZolin (ANCEF) IVPB 2g/100 mL premix     2 g 200 mL/hr over 30 Minutes Intravenous On call to O.R. 05/16/18 0756 05/16/18 1113   05/16/18 0800  ceFAZolin (ANCEF) IVPB 2g/100 mL premix  Status:  Discontinued     2 g 200 mL/hr over 30 Minutes Intravenous On call to O.R. 05/16/18 7106 05/16/18 0759    .  She was given sequential compression devices, early ambulation, and aspirin for DVT prophylaxis.  She benefited maximally from the hospital stay and there were no complications.    Recent vital signs:   Vitals:   05/17/18 0107 05/17/18 0501  BP: (!) 145/72 (!) 157/79  Pulse: 67 66  Resp: 14 16  Temp: 97.8 F (36.6 C) 98.6 F (37 C)  SpO2: 99% 99%    Recent laboratory studies:  Lab Results  Component Value Date   HGB 12.2 05/17/2018   HGB 13.0 05/09/2018   HGB 13.3 07/22/2015   Lab Results  Component Value Date   WBC 6.9 05/17/2018   PLT 224 05/17/2018   Lab Results  Component Value Date   INR 1.05 07/22/2015   Lab Results  Component Value Date   NA 134 (L) 05/17/2018   K 3.9 05/17/2018   CL 100 05/17/2018   CO2 27 05/17/2018   BUN 9 05/17/2018   CREATININE 0.54 05/17/2018   GLUCOSE 104 (H) 05/17/2018    Discharge Medications:   Allergies as of 05/17/2018      Reactions   Cortisone Swelling   Caused facial swelling and headache after receiving cortisone injection   Penicillins Rash   Childhood reaction Has patient had a PCN reaction causing immediate rash, facial/tongue/throat swelling, SOB or lightheadedness with hypotension: Yes Has patient had a PCN reaction causing severe rash involving mucus membranes or skin necrosis: No Has patient had a PCN reaction that required hospitalization No Has patient had a PCN reaction occurring within the last 10 years: No If all of the above answers are "NO", then may proceed with Cephalosporin  use.      Medication List    STOP taking these medications   HYDROcodone-acetaminophen 10-325 MG tablet Commonly known as:  NORCO     TAKE these medications   ALPRAZolam 1 MG tablet Commonly known as:  XANAX TAKE 1 TABLET BY MOUTH THREE TIMES DAILY AS NEEDED What changed:    how much to take  when to take this   aspirin EC 325 MG tablet Take 1 tablet (325 mg total) by mouth 2 (two) times daily. What changed:    medication strength  how much to take  when to take this   baclofen 10 MG tablet Commonly known as:  LIORESAL Take 1 tablet (10 mg total) by mouth 3 (three) times daily. As needed for muscle spasm    citalopram 10 MG tablet Commonly known as:  CELEXA Take 10 mg by mouth at bedtime.   imipramine 50 MG tablet Commonly known as:  TOFRANIL TAKE 3 TABLETS AT BEDTIME   naproxen 375 MG tablet Commonly known as:  NAPROSYN Take 1 tablet (375 mg total) by mouth 2 (two) times daily.   ondansetron 4 MG tablet Commonly known as:  ZOFRAN Take 1 tablet (4 mg total) by mouth every 8 (eight) hours as needed for nausea or vomiting.   oxyCODONE 5 MG immediate release tablet Commonly known as:  ROXICODONE Take 1 tablet (5 mg total) by mouth every 4 (four) hours as needed for severe pain.   pregabalin 50 MG capsule Commonly known as:  LYRICA Take 1 capsule (50 mg total) by mouth 3 (three) times daily. What changed:  when to take this   sennosides-docusate sodium 8.6-50 MG tablet Commonly known as:  SENOKOT-S Take 2 tablets by mouth daily.   traMADol 50 MG tablet Commonly known as:  ULTRAM Take 1 tablet (50 mg total) by mouth every 6 (six) hours as needed.   valACYclovir 1000 MG tablet Commonly known as:  VALTREX Take 1 tablet (1,000 mg total) by mouth 3 (three) times daily.       Diagnostic Studies: Dg Knee Left Port  Result Date: 05/16/2018 CLINICAL DATA:  Postop day 0 medial unicompartmental LEFT knee arthroplasty. EXAM: PORTABLE LEFT KNEE - 1-2 VIEW COMPARISON:  None. FINDINGS: Anatomic alignment of the prosthesis after medial unicompartmental LEFT knee arthroplasty. No acute complicating features. Patellofemoral and lateral compartment joint spaces well-preserved. IMPRESSION: Anatomic alignment post medial unicompartmental LEFT knee arthroplasty without acute complicating features. Electronically Signed   By: Evangeline Dakin M.D.   On: 05/16/2018 13:20    Disposition: Discharge disposition: 01-Home or Self Care         Follow-up Information    Marchia Bond, MD. Go on 05/29/2018.   Specialty:  Orthopedic Surgery Why:  Your appointment is scheduled for 10:45.  Contact  information: [place] [address] [phone]        [facility], [place]. Go on 05/19/2018.   Why:  You are scheduled to start Outpatient physical therapy at 10:00. Please arrive at 9:30 to complete your paperwork  Contact information: [facility] [place] [address] [phone]        Marchia Bond, MD. Schedule an appointment as soon as possible for a visit in 2 weeks.   Specialty:  Orthopedic Surgery Contact information: [address] [place] [address] [phone]            Signed: Johnny Bridge 05/17/2018, 8:46 AM

## 2018-05-17 NOTE — Progress Notes (Signed)
Physical Therapy Treatment Patient Details Name: Cassandra Alexander MRN: [id number] DOB: 02/14/1946 Today's Date: 05/17/2018    History of Present Illness s/p L UKR; PMH: L4-5 decompression/fusion    PT Comments    Pt progressing well with mobility and eager for dc home.  Spouse present for home therex program and stairs with written instruction provided.   Follow Up Recommendations  Follow surgeon's recommendation for DC plan and follow-up therapies     Equipment Recommendations  Rolling walker with 5" wheels    Recommendations for Other Services       Precautions / Restrictions Precautions Precautions: Knee Required Braces or Orthoses: Knee Immobilizer - Left Knee Immobilizer - Left: Discontinue once straight leg raise with < 10 degree lag Restrictions Weight Bearing Restrictions: No Other Position/Activity Restrictions: WBAT    Mobility  Bed Mobility               General bed mobility comments: Pt up in chair and requests back to same  Transfers Overall transfer level: Needs assistance Equipment used: Rolling walker (2 wheeled) Transfers: Sit to/from Stand Sit to Stand: Min guard         General transfer comment: cues for safety and hand placement  Ambulation/Gait Ambulation/Gait assistance: Min guard Gait Distance (Feet): 90 Feet(and 15' into bathroom) Assistive device: Rolling walker (2 wheeled) Gait Pattern/deviations: Step-to pattern;Decreased weight shift to left Gait velocity: decr   General Gait Details: cues for sequence, posture and position from [employer] Stairs: Yes Stairs assistance: Min assist Stair Management: One rail Right;Step to pattern;Forwards;With crutches Number of Stairs: 5 General stair comments: cues for sequence and foot/crutch placement; spouse assisting and written instruction provided   Wheelchair Mobility    Modified Rankin (Stroke Patients Only)       Balance Overall balance assessment: Mild deficits  observed, not formally tested                                          Cognition Arousal/Alertness: Awake/alert Behavior During Therapy: WFL for tasks assessed/performed Overall Cognitive Status: Within Functional Limits for tasks assessed                                        Exercises Total Joint Exercises Ankle Circles/Pumps: AROM;Both;15 reps;Supine Quad Sets: AROM;Both;10 reps;Supine Heel Slides: AAROM;Left;15 reps;Supine Straight Leg Raises: AAROM;Left;10 reps;Supine    General Comments        Pertinent Vitals/Pain Pain Assessment: 0-10 Pain Score: 7  Pain Location: L knee Pain Descriptors / Indicators: Aching;Guarding;Sore Pain Intervention(s): Limited activity within patient's tolerance;Monitored during session;Premedicated before session;Ice applied    Home Living                      Prior Function            PT Goals (current goals can now be found in the care plan section) Acute Rehab PT Goals Patient Stated Goal: be able to go upstairs PT Goal Formulation: With patient Time For Goal Achievement: 05/23/18 Potential to Achieve Goals: Good Progress towards PT goals: Progressing toward goals    Frequency    7X/week      PT Plan Current plan remains appropriate    Co-evaluation  AM-PAC PT "6 Clicks" Mobility   Outcome Measure  Help needed turning from your back to your side while in a flat bed without using bedrails?: A Little Help needed moving from lying on your back to sitting on the side of a flat bed without using bedrails?: A Little Help needed moving to and from a bed to a chair (including a wheelchair)?: A Little Help needed standing up from a chair using your arms (e.g., wheelchair or bedside chair)?: A Little Help needed to walk in hospital room?: A Little Help needed climbing 3-5 steps with a railing? : A Little 6 Click Score: 18    End of Session Equipment Utilized  During Treatment: Gait belt Activity Tolerance: Patient tolerated treatment well;Patient limited by pain Patient left: in chair;with call bell/phone within reach;with family/visitor present Nurse Communication: Mobility status PT Visit Diagnosis: Difficulty in walking, not elsewhere classified (R26.2)     Time: 2536-6440 PT Time Calculation (min) (ACUTE ONLY): 37 min  Charges:  $Gait Training: 8-22 mins $Therapeutic Exercise: 8-22 mins                     [place] Pager [phone] Office [phone]    Carlisle Torgeson 05/17/2018, 12:37 PM

## 2018-05-17 NOTE — Progress Notes (Signed)
Physical Therapy Treatment Patient Details Name: Cassandra Alexander MRN: [id number] DOB: 1945-04-02 Today's Date: 05/17/2018    History of Present Illness s/p L UKR; PMH: L4-5 decompression/fusion    PT Comments    Pt motivated but with elevated anxiety level.  This am, pt performed therex program with assist.  OOB deferred to after b'fast and additional pain meds at pt request.   Follow Up Recommendations  Follow surgeon's recommendation for DC plan and follow-up therapies     Equipment Recommendations  Rolling walker with 5" wheels    Recommendations for Other Services       Precautions / Restrictions Precautions Precautions: Knee Required Braces or Orthoses: Knee Immobilizer - Left Knee Immobilizer - Left: Discontinue once straight leg raise with < 10 degree lag Restrictions Weight Bearing Restrictions: No Other Position/Activity Restrictions: WBAT    Mobility  Bed Mobility               General bed mobility comments: Deferred to after bfast  Transfers                    Ambulation/Gait                 Stairs             Wheelchair Mobility    Modified Rankin (Stroke Patients Only)       Balance                                            Cognition Arousal/Alertness: Awake/alert Behavior During Therapy: WFL for tasks assessed/performed Overall Cognitive Status: Within Functional Limits for tasks assessed                                        Exercises Total Joint Exercises Ankle Circles/Pumps: AROM;Both;15 reps;Supine Quad Sets: AROM;Both;10 reps;Supine Heel Slides: AAROM;Left;15 reps;Supine Straight Leg Raises: AAROM;Left;10 reps;Supine Goniometric ROM: AAROM L knee -8 - 45    General Comments        Pertinent Vitals/Pain Pain Assessment: 0-10 Pain Score: 8  Pain Location: L knee Pain Descriptors / Indicators: Aching;Grimacing;Guarding;Sore Pain Intervention(s): Limited activity  within patient's tolerance;Monitored during session;Premedicated before session;Ice applied    Home Living                      Prior Function            PT Goals (current goals can now be found in the care plan section) Acute Rehab PT Goals Patient Stated Goal: be able to go upstairs PT Goal Formulation: With patient Time For Goal Achievement: 05/23/18 Potential to Achieve Goals: Good Progress towards PT goals: Progressing toward goals    Frequency    7X/week      PT Plan Current plan remains appropriate    Co-evaluation              AM-PAC PT "6 Clicks" Mobility   Outcome Measure  Help needed turning from your back to your side while in a flat bed without using bedrails?: A Little Help needed moving from lying on your back to sitting on the side of a flat bed without using bedrails?: A Little Help needed moving to and from a bed to a chair (including a wheelchair)?:  A Little Help needed standing up from a chair using your arms (e.g., wheelchair or bedside chair)?: A Little Help needed to walk in hospital room?: A Little Help needed climbing 3-5 steps with a railing? : A Lot 6 Click Score: 17    End of Session   Activity Tolerance: Patient tolerated treatment well;Patient limited by pain Patient left: in bed;with call bell/phone within reach Nurse Communication: Patient requests pain meds PT Visit Diagnosis: Difficulty in walking, not elsewhere classified (R26.2)     Time: 6116-4353 PT Time Calculation (min) (ACUTE ONLY): 18 min  Charges:  $Therapeutic Exercise: 8-22 mins                     [place] Pager [phone] Office [phone]    Cassandra Alexander 05/17/2018, 8:35 AM

## 2018-05-17 NOTE — Care Management Obs Status (Signed)
[facility] NOTIFICATION   Patient Details  Name: Cassandra Alexander MRN: [id number] Date of Birth: Sep 07, 1945   Medicare Observation Status Notification Given:  Yes    Leeroy Cha, RN 05/17/2018, 11:12 AM

## 2018-05-17 NOTE — Plan of Care (Signed)
  Problem: Education: Goal: Knowledge of General Education information will improve Description Including pain rating scale, medication(s)/side effects and non-pharmacologic comfort measures Outcome: Progressing   Problem: Health Behavior/Discharge Planning: Goal: Ability to manage health-related needs will improve Outcome: Progressing   Problem: Clinical Measurements: Goal: Ability to maintain clinical measurements within normal limits will improve Outcome: Progressing Goal: Will remain free from infection Outcome: Progressing Goal: Diagnostic test results will improve Outcome: Progressing Goal: Respiratory complications will improve Outcome: Progressing Goal: Cardiovascular complication will be avoided Outcome: Progressing   Problem: Activity: Goal: Risk for activity intolerance will decrease Outcome: Progressing   Problem: Nutrition: Goal: Adequate nutrition will be maintained Outcome: Progressing   Problem: Coping: Goal: Level of anxiety will decrease Outcome: Progressing   Problem: Elimination: Goal: Will not experience complications related to bowel motility Outcome: Progressing Goal: Will not experience complications related to urinary retention Outcome: Progressing   Problem: Pain Managment: Goal: General experience of comfort will improve Outcome: Progressing   Problem: Safety: Goal: Ability to remain free from injury will improve Outcome: Progressing   Problem: Skin Integrity: Goal: Risk for impaired skin integrity will decrease Outcome: Progressing   Problem: Education: Goal: Knowledge of the prescribed therapeutic regimen will improve Outcome: Progressing Goal: Individualized Educational Video(s) Outcome: Progressing   Problem: Activity: Goal: Ability to avoid complications of mobility impairment will improve Outcome: Progressing Goal: Range of joint motion will improve Outcome: Progressing   Problem: Clinical Measurements: Goal:  Postoperative complications will be avoided or minimized Outcome: Progressing   Problem: Pain Management: Goal: Pain level will decrease with appropriate interventions Outcome: Progressing   Problem: Skin Integrity: Goal: Will show signs of wound healing Outcome: Progressing

## 2018-05-17 NOTE — Progress Notes (Addendum)
Patient ID: Cassandra Alexander, female   DOB: 12/13/1945, 73 y.o.   MRN: [id number]     Subjective:  Patient reports pain as mild.  Patient in bed and asking to go home  Objective:   VITALS:   Vitals:   05/16/18 1743 05/16/18 2229 05/17/18 0107 05/17/18 0501  BP: 128/83 (!) 144/75 (!) 145/72 (!) 157/79  Pulse: (!) 56 73 67 66  Resp: 16 16 14 16   Temp: 97.7 F (36.5 C) 98.1 F (36.7 C) 97.8 F (36.6 C) 98.6 F (37 C)  TempSrc: Oral Oral Oral Oral  SpO2: 100% 98% 99% 99%  Weight:      Height:        ABD soft Sensation intact distally Dorsiflexion/Plantar flexion intact Incision: dressing C/D/I and no drainage   Lab Results  Component Value Date   WBC 6.9 05/17/2018   HGB 12.2 05/17/2018   HCT 40.3 05/17/2018   MCV 102.5 (H) 05/17/2018   PLT 224 05/17/2018   BMET    Component Value Date/Time   NA 134 (L) 05/17/2018 0516   K 3.9 05/17/2018 0516   CL 100 05/17/2018 0516   CO2 27 05/17/2018 0516   GLUCOSE 104 (H) 05/17/2018 0516   GLUCOSE 94 01/26/2006 1026   BUN 9 05/17/2018 0516   CREATININE 0.54 05/17/2018 0516   CALCIUM 8.4 (L) 05/17/2018 0516   GFRNONAA >60 05/17/2018 0516   GFRAA >60 05/17/2018 0516     Assessment/Plan: 1 Day Post-Op   Principal Problem:   Primary localized osteoarthritis of left knee Active Problems:   S/P left unicompartmental knee replacement   Advance diet Up with therapy WBAT Dry dressing PRN DC home   Patient's anticipated LOS is less than 2 midnights, meeting these requirements: - Younger than 52 - Lives within 1 hour of care - Has a competent adult at home to recover with post-op recover - NO history of  - Chronic pain requiring opiods  - Diabetes  - Coronary Artery Disease  - Heart failure  - Heart attack  - Stroke  - DVT/VTE  - Cardiac arrhythmia  - Respiratory Failure/COPD  - Renal failure  - Anemia  - Advanced Liver disease        Lunette Stands 05/17/2018, 7:55 AM  Discussed and agree with  above.   Marchia Bond, MD Cell [phone]

## 2018-05-17 NOTE — Progress Notes (Signed)
Discharge paperwork discussed with pt and husband at the bedside. Pt eager to go home. Progressed well with PT.  They demonstrated understanding.  Pt escorted in stable condition to main lobby by wheelchair.

## 2018-05-23 DIAGNOSIS — M1712 Unilateral primary osteoarthritis, left knee: Secondary | ICD-10-CM | POA: Diagnosis not present

## 2018-05-25 DIAGNOSIS — M1712 Unilateral primary osteoarthritis, left knee: Secondary | ICD-10-CM | POA: Diagnosis not present

## 2018-05-29 DIAGNOSIS — M1712 Unilateral primary osteoarthritis, left knee: Secondary | ICD-10-CM | POA: Diagnosis not present

## 2018-06-02 DIAGNOSIS — M1712 Unilateral primary osteoarthritis, left knee: Secondary | ICD-10-CM | POA: Diagnosis not present

## 2018-06-06 DIAGNOSIS — M1712 Unilateral primary osteoarthritis, left knee: Secondary | ICD-10-CM | POA: Diagnosis not present

## 2018-06-08 DIAGNOSIS — M1712 Unilateral primary osteoarthritis, left knee: Secondary | ICD-10-CM | POA: Diagnosis not present

## 2018-06-13 DIAGNOSIS — M1712 Unilateral primary osteoarthritis, left knee: Secondary | ICD-10-CM | POA: Diagnosis not present

## 2018-06-15 DIAGNOSIS — M1712 Unilateral primary osteoarthritis, left knee: Secondary | ICD-10-CM | POA: Diagnosis not present

## 2018-06-20 DIAGNOSIS — M1712 Unilateral primary osteoarthritis, left knee: Secondary | ICD-10-CM | POA: Diagnosis not present

## 2018-06-22 DIAGNOSIS — M1712 Unilateral primary osteoarthritis, left knee: Secondary | ICD-10-CM | POA: Diagnosis not present

## 2018-06-26 DIAGNOSIS — M1712 Unilateral primary osteoarthritis, left knee: Secondary | ICD-10-CM | POA: Diagnosis not present

## 2018-07-07 DIAGNOSIS — B029 Zoster without complications: Secondary | ICD-10-CM | POA: Diagnosis not present

## 2018-07-07 DIAGNOSIS — M545 Low back pain: Secondary | ICD-10-CM | POA: Diagnosis not present

## 2018-07-07 DIAGNOSIS — B0229 Other postherpetic nervous system involvement: Secondary | ICD-10-CM | POA: Diagnosis not present

## 2018-07-07 DIAGNOSIS — R69 Illness, unspecified: Secondary | ICD-10-CM | POA: Diagnosis not present

## 2018-07-07 DIAGNOSIS — M150 Primary generalized (osteo)arthritis: Secondary | ICD-10-CM | POA: Diagnosis not present

## 2018-07-07 DIAGNOSIS — Z0000 Encounter for general adult medical examination without abnormal findings: Secondary | ICD-10-CM | POA: Diagnosis not present

## 2018-07-24 DIAGNOSIS — M1712 Unilateral primary osteoarthritis, left knee: Secondary | ICD-10-CM | POA: Diagnosis not present

## 2018-08-15 DIAGNOSIS — Z20828 Contact with and (suspected) exposure to other viral communicable diseases: Secondary | ICD-10-CM | POA: Diagnosis not present

## 2018-09-11 ENCOUNTER — Telehealth (HOSPITAL_COMMUNITY): Payer: Self-pay

## 2018-09-11 NOTE — Telephone Encounter (Signed)
Called to schedule f/u mri. Pt does not want to schedule anything at this time unless it's emergent. Will call if she changes her mind. AW

## 2018-10-04 DIAGNOSIS — Z96652 Presence of left artificial knee joint: Secondary | ICD-10-CM | POA: Diagnosis not present

## 2018-11-13 DIAGNOSIS — N9411 Superficial (introital) dyspareunia: Secondary | ICD-10-CM | POA: Diagnosis not present

## 2019-01-02 ENCOUNTER — Other Ambulatory Visit: Payer: Self-pay | Admitting: Registered"

## 2019-01-02 DIAGNOSIS — Z20828 Contact with and (suspected) exposure to other viral communicable diseases: Secondary | ICD-10-CM | POA: Diagnosis not present

## 2019-01-02 DIAGNOSIS — Z20822 Contact with and (suspected) exposure to covid-19: Secondary | ICD-10-CM

## 2019-01-03 DIAGNOSIS — H52223 Regular astigmatism, bilateral: Secondary | ICD-10-CM | POA: Diagnosis not present

## 2019-01-03 DIAGNOSIS — H43813 Vitreous degeneration, bilateral: Secondary | ICD-10-CM | POA: Diagnosis not present

## 2019-01-03 DIAGNOSIS — H43393 Other vitreous opacities, bilateral: Secondary | ICD-10-CM | POA: Diagnosis not present

## 2019-01-03 DIAGNOSIS — H524 Presbyopia: Secondary | ICD-10-CM | POA: Diagnosis not present

## 2019-01-03 DIAGNOSIS — H5203 Hypermetropia, bilateral: Secondary | ICD-10-CM | POA: Diagnosis not present

## 2019-01-04 LAB — NOVEL CORONAVIRUS, NAA: SARS-CoV-2, NAA: NOT DETECTED

## 2019-01-05 DIAGNOSIS — M545 Low back pain: Secondary | ICD-10-CM | POA: Diagnosis not present

## 2019-01-05 DIAGNOSIS — F320 Major depressive disorder, single episode, mild: Secondary | ICD-10-CM | POA: Diagnosis not present

## 2019-01-05 DIAGNOSIS — M150 Primary generalized (osteo)arthritis: Secondary | ICD-10-CM | POA: Diagnosis not present

## 2019-01-05 DIAGNOSIS — R69 Illness, unspecified: Secondary | ICD-10-CM | POA: Diagnosis not present

## 2019-02-02 DIAGNOSIS — Z23 Encounter for immunization: Secondary | ICD-10-CM | POA: Diagnosis not present

## 2019-02-02 DIAGNOSIS — M545 Low back pain: Secondary | ICD-10-CM | POA: Diagnosis not present

## 2019-07-09 DIAGNOSIS — M545 Low back pain: Secondary | ICD-10-CM | POA: Diagnosis not present

## 2019-07-09 DIAGNOSIS — K589 Irritable bowel syndrome without diarrhea: Secondary | ICD-10-CM | POA: Diagnosis not present

## 2019-07-09 DIAGNOSIS — Z0000 Encounter for general adult medical examination without abnormal findings: Secondary | ICD-10-CM | POA: Diagnosis not present

## 2019-07-09 DIAGNOSIS — F411 Generalized anxiety disorder: Secondary | ICD-10-CM | POA: Diagnosis not present

## 2019-08-21 ENCOUNTER — Other Ambulatory Visit: Payer: Self-pay | Admitting: Gastroenterology

## 2019-08-21 DIAGNOSIS — K5909 Other constipation: Secondary | ICD-10-CM | POA: Diagnosis not present

## 2019-08-21 DIAGNOSIS — R1032 Left lower quadrant pain: Secondary | ICD-10-CM | POA: Diagnosis not present

## 2019-08-21 DIAGNOSIS — Z1211 Encounter for screening for malignant neoplasm of colon: Secondary | ICD-10-CM | POA: Diagnosis not present

## 2019-08-21 DIAGNOSIS — F1190 Opioid use, unspecified, uncomplicated: Secondary | ICD-10-CM | POA: Diagnosis not present

## 2019-08-21 DIAGNOSIS — Z9049 Acquired absence of other specified parts of digestive tract: Secondary | ICD-10-CM | POA: Diagnosis not present

## 2019-09-06 ENCOUNTER — Ambulatory Visit
Admission: RE | Admit: 2019-09-06 | Discharge: 2019-09-06 | Disposition: A | Discharge status: Home / Self Care | Payer: Medicare PPO | Source: Ambulatory Visit | Attending: Gastroenterology | Admitting: Gastroenterology

## 2019-09-06 ENCOUNTER — Other Ambulatory Visit: Payer: Self-pay

## 2019-09-06 DIAGNOSIS — R1032 Left lower quadrant pain: Secondary | ICD-10-CM

## 2019-09-06 DIAGNOSIS — K5900 Constipation, unspecified: Secondary | ICD-10-CM | POA: Diagnosis not present

## 2019-09-06 MED ORDER — IOPAMIDOL (ISOVUE-300) INJECTION 61%
100.0000 mL | Freq: Once | INTRAVENOUS | Status: AC | PRN
  Administered 2019-09-06: 100 mL via INTRAVENOUS

## 2019-09-21 DIAGNOSIS — Z1159 Encounter for screening for other viral diseases: Secondary | ICD-10-CM | POA: Diagnosis not present

## 2019-09-26 DIAGNOSIS — Q438 Other specified congenital malformations of intestine: Secondary | ICD-10-CM | POA: Diagnosis not present

## 2019-09-26 DIAGNOSIS — Z980 Intestinal bypass and anastomosis status: Secondary | ICD-10-CM | POA: Diagnosis not present

## 2019-09-26 DIAGNOSIS — K6289 Other specified diseases of anus and rectum: Secondary | ICD-10-CM | POA: Diagnosis not present

## 2019-09-26 DIAGNOSIS — K529 Noninfective gastroenteritis and colitis, unspecified: Secondary | ICD-10-CM | POA: Diagnosis not present

## 2019-09-26 DIAGNOSIS — Z1211 Encounter for screening for malignant neoplasm of colon: Secondary | ICD-10-CM | POA: Diagnosis not present

## 2019-09-26 DIAGNOSIS — K644 Residual hemorrhoidal skin tags: Secondary | ICD-10-CM | POA: Diagnosis not present

## 2019-09-26 DIAGNOSIS — K649 Unspecified hemorrhoids: Secondary | ICD-10-CM | POA: Diagnosis not present

## 2019-10-01 DIAGNOSIS — K529 Noninfective gastroenteritis and colitis, unspecified: Secondary | ICD-10-CM | POA: Diagnosis not present

## 2019-10-29 DIAGNOSIS — F1190 Opioid use, unspecified, uncomplicated: Secondary | ICD-10-CM | POA: Diagnosis not present

## 2019-10-29 DIAGNOSIS — Z9049 Acquired absence of other specified parts of digestive tract: Secondary | ICD-10-CM | POA: Diagnosis not present

## 2019-10-29 DIAGNOSIS — K5909 Other constipation: Secondary | ICD-10-CM | POA: Diagnosis not present

## 2019-11-09 DIAGNOSIS — Z1231 Encounter for screening mammogram for malignant neoplasm of breast: Secondary | ICD-10-CM | POA: Diagnosis not present

## 2019-11-20 ENCOUNTER — Ambulatory Visit: Payer: Self-pay | Attending: Internal Medicine

## 2019-11-20 DIAGNOSIS — Z23 Encounter for immunization: Secondary | ICD-10-CM

## 2019-11-20 NOTE — Progress Notes (Signed)
   Covid-19 Vaccination Clinic  Name:  Cassandra Alexander    MRN: [id number] DOB: 10-22-1945  11/20/2019  Cassandra Alexander was observed post Covid-19 immunization for 15 minutes without incident. She was provided with Vaccine Information Sheet and instruction to access the V-Safe system.   Cassandra Alexander was instructed to call 911 with any severe reactions post vaccine: Marland Kitchen Difficulty breathing  . Swelling of face and throat  . A fast heartbeat  . A bad rash all over body  . Dizziness and weakness

## 2019-12-27 IMAGING — DX DG KNEE 1-2V PORT*L*
1 series · 1 of 1 positions shown · non-contrast
Comparison: None.

CLINICAL DATA: Postop day 0 medial unicompartmental LEFT knee
arthroplasty.

EXAM:
PORTABLE LEFT KNEE - 1-2 VIEW

[knee lat]
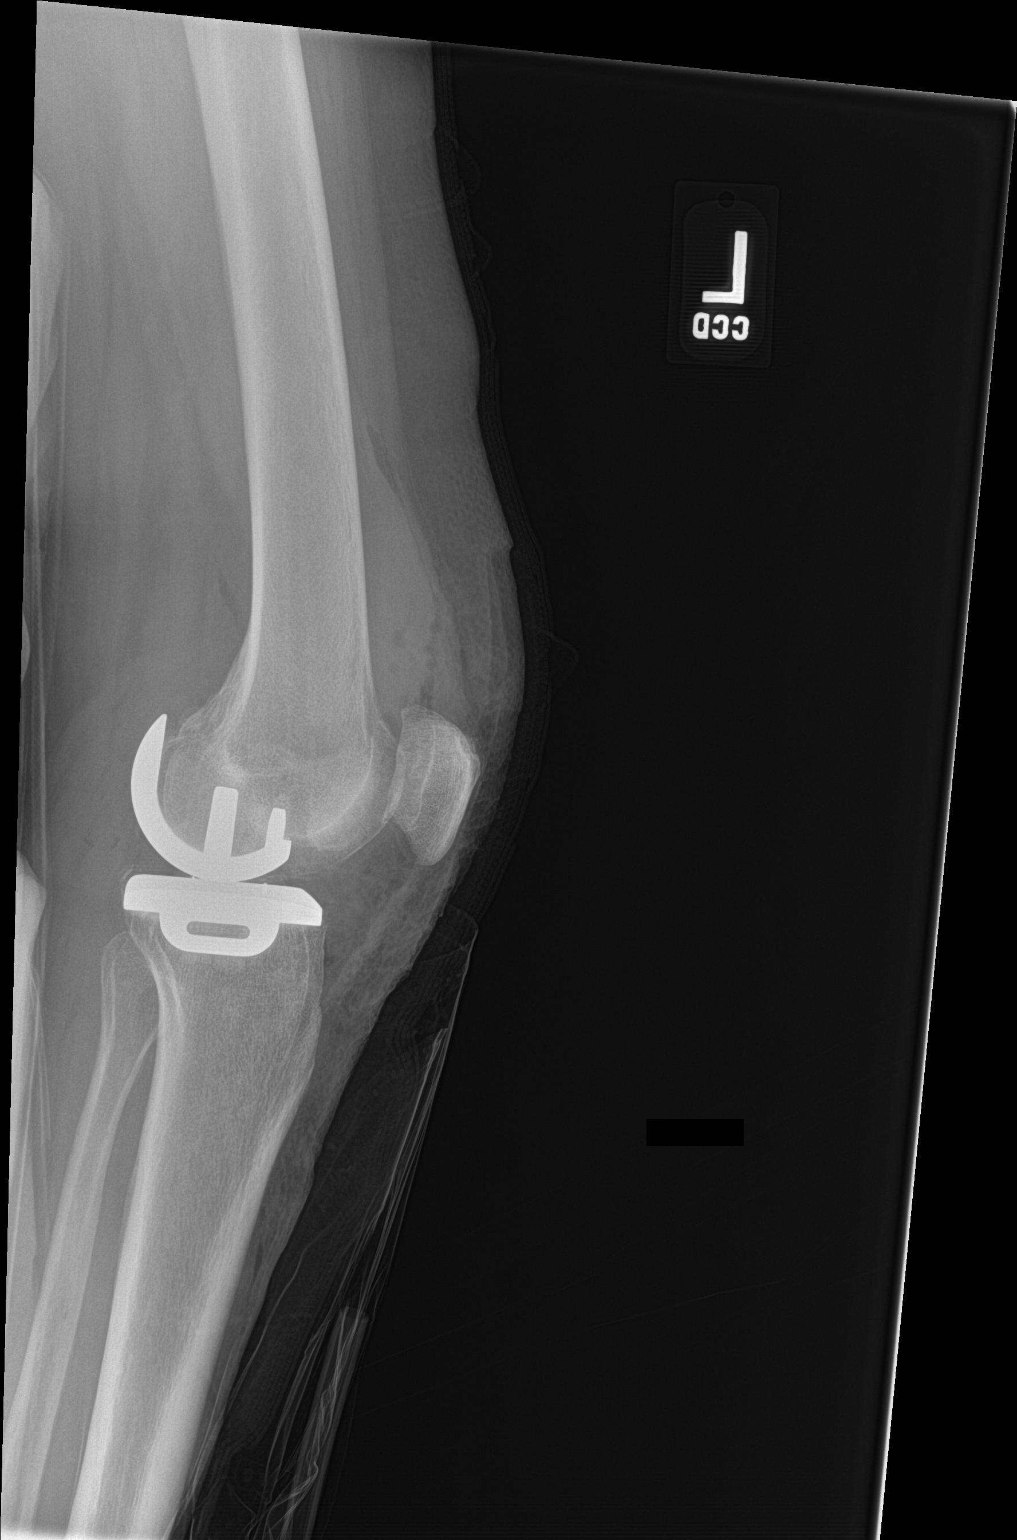

[1 of 1 positions shown; findings below may reference images not displayed]

FINDINGS: Anatomic alignment of the prosthesis after medial unicompartmental
LEFT knee arthroplasty.

No acute complicating features. Patellofemoral and lateral
compartment joint spaces well-preserved.
IMPRESSION: Anatomic alignment post medial unicompartmental LEFT knee
arthroplasty without acute complicating features.

## 2020-01-10 DIAGNOSIS — M150 Primary generalized (osteo)arthritis: Secondary | ICD-10-CM | POA: Diagnosis not present

## 2020-01-10 DIAGNOSIS — R059 Cough, unspecified: Secondary | ICD-10-CM | POA: Diagnosis not present

## 2020-01-10 DIAGNOSIS — F320 Major depressive disorder, single episode, mild: Secondary | ICD-10-CM | POA: Diagnosis not present

## 2020-01-10 DIAGNOSIS — F411 Generalized anxiety disorder: Secondary | ICD-10-CM | POA: Diagnosis not present

## 2020-01-10 DIAGNOSIS — M545 Low back pain, unspecified: Secondary | ICD-10-CM | POA: Diagnosis not present

## 2020-01-26 DIAGNOSIS — J209 Acute bronchitis, unspecified: Secondary | ICD-10-CM | POA: Diagnosis not present

## 2020-02-25 DIAGNOSIS — F1190 Opioid use, unspecified, uncomplicated: Secondary | ICD-10-CM | POA: Diagnosis not present

## 2020-02-25 DIAGNOSIS — Z9049 Acquired absence of other specified parts of digestive tract: Secondary | ICD-10-CM | POA: Diagnosis not present

## 2020-02-25 DIAGNOSIS — K5909 Other constipation: Secondary | ICD-10-CM | POA: Diagnosis not present

## 2020-02-25 DIAGNOSIS — R630 Anorexia: Secondary | ICD-10-CM | POA: Diagnosis not present

## 2020-02-27 ENCOUNTER — Other Ambulatory Visit: Payer: Self-pay

## 2020-02-27 ENCOUNTER — Emergency Department (HOSPITAL_COMMUNITY)
Admission: EM | Admit: 2020-02-27 | Discharge: 2020-02-28 | Disposition: A | Discharge status: Home / Self Care | Payer: PPO | Attending: Emergency Medicine | Admitting: Emergency Medicine

## 2020-02-27 ENCOUNTER — Encounter (HOSPITAL_COMMUNITY): Payer: Self-pay | Admitting: Obstetrics and Gynecology

## 2020-02-27 DIAGNOSIS — R0789 Other chest pain: Secondary | ICD-10-CM

## 2020-02-27 DIAGNOSIS — R079 Chest pain, unspecified: Secondary | ICD-10-CM | POA: Diagnosis not present

## 2020-02-27 DIAGNOSIS — Z7982 Long term (current) use of aspirin: Secondary | ICD-10-CM | POA: Insufficient documentation

## 2020-02-27 DIAGNOSIS — Y92410 Unspecified street and highway as the place of occurrence of the external cause: Secondary | ICD-10-CM | POA: Insufficient documentation

## 2020-02-27 DIAGNOSIS — Z87891 Personal history of nicotine dependence: Secondary | ICD-10-CM | POA: Diagnosis not present

## 2020-02-27 DIAGNOSIS — S8011XA Contusion of right lower leg, initial encounter: Secondary | ICD-10-CM | POA: Diagnosis not present

## 2020-02-27 DIAGNOSIS — Z041 Encounter for examination and observation following transport accident: Secondary | ICD-10-CM | POA: Diagnosis not present

## 2020-02-27 DIAGNOSIS — S8012XA Contusion of left lower leg, initial encounter: Secondary | ICD-10-CM | POA: Insufficient documentation

## 2020-02-27 DIAGNOSIS — J9811 Atelectasis: Secondary | ICD-10-CM | POA: Diagnosis not present

## 2020-02-27 DIAGNOSIS — J90 Pleural effusion, not elsewhere classified: Secondary | ICD-10-CM | POA: Diagnosis not present

## 2020-02-27 DIAGNOSIS — S8991XA Unspecified injury of right lower leg, initial encounter: Secondary | ICD-10-CM | POA: Diagnosis present
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
## 2020-02-28 ENCOUNTER — Emergency Department (HOSPITAL_COMMUNITY): Payer: PPO

## 2020-02-28 DIAGNOSIS — Z041 Encounter for examination and observation following transport accident: Secondary | ICD-10-CM | POA: Diagnosis not present

## 2020-02-28 DIAGNOSIS — J9811 Atelectasis: Secondary | ICD-10-CM | POA: Diagnosis not present

## 2020-02-28 DIAGNOSIS — J90 Pleural effusion, not elsewhere classified: Secondary | ICD-10-CM | POA: Diagnosis not present

## 2020-02-28 MED ORDER — ACETAMINOPHEN 500 MG PO TABS
1000.0000 mg | ORAL_TABLET | Freq: Once | ORAL | Status: AC
  Administered 2020-02-28: 1000 mg via ORAL
  Filled 2020-02-28: qty 2

## 2020-02-28 MED ORDER — HYDROCODONE-ACETAMINOPHEN 5-325 MG PO TABS
2.0000 | ORAL_TABLET | Freq: Once | ORAL | Status: DC
  Filled 2020-02-28: qty 2

## 2020-02-28 NOTE — Discharge Instructions (Signed)
Your xrays showed no fractures.  Continue your pain medication as prescribed.  Please rest over the next several days.  You may apply ice to painful, swollen or bruised areas.

## 2020-02-28 NOTE — ED Notes (Signed)
Discharged no concerns at this time.
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
## 2020-03-09 DIAGNOSIS — J189 Pneumonia, unspecified organism: Secondary | ICD-10-CM | POA: Diagnosis not present

## 2020-03-19 DIAGNOSIS — R634 Abnormal weight loss: Secondary | ICD-10-CM | POA: Diagnosis not present

## 2020-03-19 DIAGNOSIS — F411 Generalized anxiety disorder: Secondary | ICD-10-CM | POA: Diagnosis not present

## 2020-03-19 DIAGNOSIS — R079 Chest pain, unspecified: Secondary | ICD-10-CM | POA: Diagnosis not present

## 2020-03-19 DIAGNOSIS — J189 Pneumonia, unspecified organism: Secondary | ICD-10-CM | POA: Diagnosis not present

## 2020-04-18 DIAGNOSIS — R634 Abnormal weight loss: Secondary | ICD-10-CM | POA: Diagnosis not present

## 2020-07-10 DIAGNOSIS — I700 Atherosclerosis of aorta: Secondary | ICD-10-CM | POA: Diagnosis not present

## 2020-07-10 DIAGNOSIS — F411 Generalized anxiety disorder: Secondary | ICD-10-CM | POA: Diagnosis not present

## 2020-07-10 DIAGNOSIS — M150 Primary generalized (osteo)arthritis: Secondary | ICD-10-CM | POA: Diagnosis not present

## 2020-07-10 DIAGNOSIS — F320 Major depressive disorder, single episode, mild: Secondary | ICD-10-CM | POA: Diagnosis not present

## 2020-07-10 DIAGNOSIS — M5459 Other low back pain: Secondary | ICD-10-CM | POA: Diagnosis not present

## 2020-08-26 DIAGNOSIS — H5203 Hypermetropia, bilateral: Secondary | ICD-10-CM | POA: Diagnosis not present

## 2020-08-26 DIAGNOSIS — H524 Presbyopia: Secondary | ICD-10-CM | POA: Diagnosis not present

## 2020-08-26 DIAGNOSIS — H43399 Other vitreous opacities, unspecified eye: Secondary | ICD-10-CM | POA: Diagnosis not present

## 2020-08-26 DIAGNOSIS — H43819 Vitreous degeneration, unspecified eye: Secondary | ICD-10-CM | POA: Diagnosis not present

## 2020-08-26 DIAGNOSIS — H353111 Nonexudative age-related macular degeneration, right eye, early dry stage: Secondary | ICD-10-CM | POA: Diagnosis not present

## 2020-08-26 DIAGNOSIS — H52223 Regular astigmatism, bilateral: Secondary | ICD-10-CM | POA: Diagnosis not present

## 2020-08-26 DIAGNOSIS — H353121 Nonexudative age-related macular degeneration, left eye, early dry stage: Secondary | ICD-10-CM | POA: Diagnosis not present

## 2020-08-26 DIAGNOSIS — H25813 Combined forms of age-related cataract, bilateral: Secondary | ICD-10-CM | POA: Diagnosis not present

## 2020-10-13 DIAGNOSIS — M4120 Other idiopathic scoliosis, site unspecified: Secondary | ICD-10-CM | POA: Diagnosis not present

## 2020-11-10 DIAGNOSIS — Z1231 Encounter for screening mammogram for malignant neoplasm of breast: Secondary | ICD-10-CM | POA: Diagnosis not present

## 2020-11-11 DIAGNOSIS — M461 Sacroiliitis, not elsewhere classified: Secondary | ICD-10-CM | POA: Diagnosis not present

## 2020-11-11 DIAGNOSIS — M4120 Other idiopathic scoliosis, site unspecified: Secondary | ICD-10-CM | POA: Diagnosis not present

## 2021-01-12 DIAGNOSIS — E2839 Other primary ovarian failure: Secondary | ICD-10-CM | POA: Diagnosis not present

## 2021-01-12 DIAGNOSIS — F411 Generalized anxiety disorder: Secondary | ICD-10-CM | POA: Diagnosis not present

## 2021-01-12 DIAGNOSIS — M545 Low back pain, unspecified: Secondary | ICD-10-CM | POA: Diagnosis not present

## 2021-01-12 DIAGNOSIS — M150 Primary generalized (osteo)arthritis: Secondary | ICD-10-CM | POA: Diagnosis not present

## 2021-01-12 DIAGNOSIS — I700 Atherosclerosis of aorta: Secondary | ICD-10-CM | POA: Diagnosis not present

## 2021-01-12 DIAGNOSIS — Z23 Encounter for immunization: Secondary | ICD-10-CM | POA: Diagnosis not present

## 2021-01-12 DIAGNOSIS — F320 Major depressive disorder, single episode, mild: Secondary | ICD-10-CM | POA: Diagnosis not present

## 2021-01-12 DIAGNOSIS — M5459 Other low back pain: Secondary | ICD-10-CM | POA: Diagnosis not present

## 2021-01-12 DIAGNOSIS — Z0000 Encounter for general adult medical examination without abnormal findings: Secondary | ICD-10-CM | POA: Diagnosis not present

## 2021-01-20 DIAGNOSIS — Z780 Asymptomatic menopausal state: Secondary | ICD-10-CM | POA: Diagnosis not present

## 2021-01-20 DIAGNOSIS — M810 Age-related osteoporosis without current pathological fracture: Secondary | ICD-10-CM | POA: Diagnosis not present

## 2021-01-20 DIAGNOSIS — M85851 Other specified disorders of bone density and structure, right thigh: Secondary | ICD-10-CM | POA: Diagnosis not present

## 2021-02-26 DIAGNOSIS — H5203 Hypermetropia, bilateral: Secondary | ICD-10-CM | POA: Diagnosis not present

## 2021-02-26 DIAGNOSIS — H2513 Age-related nuclear cataract, bilateral: Secondary | ICD-10-CM | POA: Diagnosis not present

## 2021-02-26 DIAGNOSIS — H524 Presbyopia: Secondary | ICD-10-CM | POA: Diagnosis not present

## 2021-02-26 DIAGNOSIS — H52223 Regular astigmatism, bilateral: Secondary | ICD-10-CM | POA: Diagnosis not present

## 2021-03-05 DIAGNOSIS — F1190 Opioid use, unspecified, uncomplicated: Secondary | ICD-10-CM | POA: Diagnosis not present

## 2021-03-05 DIAGNOSIS — R195 Other fecal abnormalities: Secondary | ICD-10-CM | POA: Diagnosis not present

## 2021-03-05 DIAGNOSIS — Z9049 Acquired absence of other specified parts of digestive tract: Secondary | ICD-10-CM | POA: Diagnosis not present

## 2021-03-05 DIAGNOSIS — K6289 Other specified diseases of anus and rectum: Secondary | ICD-10-CM | POA: Diagnosis not present

## 2021-03-05 DIAGNOSIS — K5909 Other constipation: Secondary | ICD-10-CM | POA: Diagnosis not present

## 2021-03-10 ENCOUNTER — Other Ambulatory Visit: Payer: Self-pay | Admitting: Gastroenterology

## 2021-03-10 DIAGNOSIS — K6289 Other specified diseases of anus and rectum: Secondary | ICD-10-CM

## 2021-03-10 DIAGNOSIS — R7982 Elevated C-reactive protein (CRP): Secondary | ICD-10-CM

## 2021-03-17 ENCOUNTER — Ambulatory Visit
Admission: RE | Admit: 2021-03-17 | Discharge: 2021-03-17 | Disposition: A | Discharge status: Home / Self Care | Payer: PPO | Source: Ambulatory Visit | Attending: Gastroenterology | Admitting: Gastroenterology

## 2021-03-17 DIAGNOSIS — K6389 Other specified diseases of intestine: Secondary | ICD-10-CM | POA: Diagnosis not present

## 2021-03-17 DIAGNOSIS — K3189 Other diseases of stomach and duodenum: Secondary | ICD-10-CM | POA: Diagnosis not present

## 2021-03-17 DIAGNOSIS — K6289 Other specified diseases of anus and rectum: Secondary | ICD-10-CM

## 2021-03-17 DIAGNOSIS — R102 Pelvic and perineal pain: Secondary | ICD-10-CM | POA: Diagnosis not present

## 2021-03-17 DIAGNOSIS — N281 Cyst of kidney, acquired: Secondary | ICD-10-CM | POA: Diagnosis not present

## 2021-03-17 DIAGNOSIS — R7982 Elevated C-reactive protein (CRP): Secondary | ICD-10-CM

## 2021-03-17 MED ORDER — IOPAMIDOL (ISOVUE-300) INJECTION 61%
100.0000 mL | Freq: Once | INTRAVENOUS | Status: AC | PRN
  Administered 2021-03-17: 100 mL via INTRAVENOUS

## 2021-03-17 MED ORDER — IOPAMIDOL (ISOVUE-300) INJECTION 61%: 100.0000 mL | Freq: Once | INTRAVENOUS | Status: DC | PRN

## 2021-04-10 DIAGNOSIS — K2950 Unspecified chronic gastritis without bleeding: Secondary | ICD-10-CM | POA: Diagnosis not present

## 2021-04-10 DIAGNOSIS — K529 Noninfective gastroenteritis and colitis, unspecified: Secondary | ICD-10-CM | POA: Diagnosis not present

## 2021-04-10 DIAGNOSIS — Q394 Esophageal web: Secondary | ICD-10-CM | POA: Diagnosis not present

## 2021-04-10 DIAGNOSIS — K644 Residual hemorrhoidal skin tags: Secondary | ICD-10-CM | POA: Diagnosis not present

## 2021-04-10 DIAGNOSIS — K3189 Other diseases of stomach and duodenum: Secondary | ICD-10-CM | POA: Diagnosis not present

## 2021-04-10 DIAGNOSIS — Z980 Intestinal bypass and anastomosis status: Secondary | ICD-10-CM | POA: Diagnosis not present

## 2021-04-10 DIAGNOSIS — K648 Other hemorrhoids: Secondary | ICD-10-CM | POA: Diagnosis not present

## 2021-04-10 DIAGNOSIS — K2930 Chronic superficial gastritis without bleeding: Secondary | ICD-10-CM | POA: Diagnosis not present

## 2021-04-10 DIAGNOSIS — R933 Abnormal findings on diagnostic imaging of other parts of digestive tract: Secondary | ICD-10-CM | POA: Diagnosis not present

## 2021-04-10 DIAGNOSIS — R1310 Dysphagia, unspecified: Secondary | ICD-10-CM | POA: Diagnosis not present

## 2021-04-13 ENCOUNTER — Other Ambulatory Visit: Payer: Self-pay | Admitting: Gastroenterology

## 2021-04-13 DIAGNOSIS — R933 Abnormal findings on diagnostic imaging of other parts of digestive tract: Secondary | ICD-10-CM

## 2021-04-17 DIAGNOSIS — K2930 Chronic superficial gastritis without bleeding: Secondary | ICD-10-CM | POA: Diagnosis not present

## 2021-04-17 DIAGNOSIS — K529 Noninfective gastroenteritis and colitis, unspecified: Secondary | ICD-10-CM | POA: Diagnosis not present

## 2021-04-24 DIAGNOSIS — H18413 Arcus senilis, bilateral: Secondary | ICD-10-CM | POA: Diagnosis not present

## 2021-04-24 DIAGNOSIS — H2513 Age-related nuclear cataract, bilateral: Secondary | ICD-10-CM | POA: Diagnosis not present

## 2021-04-24 DIAGNOSIS — H353132 Nonexudative age-related macular degeneration, bilateral, intermediate dry stage: Secondary | ICD-10-CM | POA: Diagnosis not present

## 2021-04-24 DIAGNOSIS — H2512 Age-related nuclear cataract, left eye: Secondary | ICD-10-CM | POA: Diagnosis not present

## 2021-04-24 DIAGNOSIS — H25013 Cortical age-related cataract, bilateral: Secondary | ICD-10-CM | POA: Diagnosis not present

## 2021-05-01 ENCOUNTER — Ambulatory Visit
Admission: RE | Admit: 2021-05-01 | Discharge: 2021-05-01 | Disposition: A | Discharge status: Home / Self Care | Payer: PPO | Source: Ambulatory Visit | Attending: Gastroenterology | Admitting: Gastroenterology

## 2021-05-01 ENCOUNTER — Other Ambulatory Visit: Payer: Self-pay

## 2021-05-01 DIAGNOSIS — J9811 Atelectasis: Secondary | ICD-10-CM | POA: Diagnosis not present

## 2021-05-01 DIAGNOSIS — Z85828 Personal history of other malignant neoplasm of skin: Secondary | ICD-10-CM | POA: Diagnosis not present

## 2021-05-01 DIAGNOSIS — K449 Diaphragmatic hernia without obstruction or gangrene: Secondary | ICD-10-CM | POA: Diagnosis not present

## 2021-05-01 DIAGNOSIS — J432 Centrilobular emphysema: Secondary | ICD-10-CM | POA: Diagnosis not present

## 2021-05-01 DIAGNOSIS — R933 Abnormal findings on diagnostic imaging of other parts of digestive tract: Secondary | ICD-10-CM

## 2021-05-01 MED ORDER — IOPAMIDOL (ISOVUE-300) INJECTION 61%
75.0000 mL | Freq: Once | INTRAVENOUS | Status: AC | PRN
  Administered 2021-05-01: 75 mL via INTRAVENOUS

## 2021-05-20 ENCOUNTER — Ambulatory Visit: Payer: PPO | Admitting: Emergency Medicine

## 2021-05-20 ENCOUNTER — Encounter: Payer: Self-pay | Admitting: Emergency Medicine

## 2021-05-20 ENCOUNTER — Other Ambulatory Visit: Payer: Self-pay

## 2021-05-20 DIAGNOSIS — R911 Solitary pulmonary nodule: Secondary | ICD-10-CM | POA: Insufficient documentation

## 2021-05-20 HISTORY — DX: Solitary pulmonary nodule: R91.1

## 2021-05-20 NOTE — Patient Instructions (Signed)
We will arrange for a PET scan to further evaluate your pulmonary nodule ?We will arrange for pulmonary function testing ?We discussed possible participation in a clinical trial to help risk stratify pulmonary nodules. ?Follow with Dr Lamonte Sakai next available after your studies to review the results together. ?

## 2021-05-20 NOTE — Assessment & Plan Note (Addendum)
Anterior left upper lobe pulmonary nodule, new compared with 2020.  At least intermediate risk for malignancy Cassandra Alexander 44.9%).  Believe it needs further evaluation now with PET scan.  Talked about the option for possible primary resection versus navigational bronchoscopy to further evaluate depending on risk stratification.  Also discussed veracyte trial with her - she is interested in participating.  ? ?We will arrange for a PET scan to further evaluate your pulmonary nodule ?We will arrange for pulmonary function testing ?We discussed possible participation in a clinical trial to help risk stratify pulmonary nodules. ?Follow with Dr Lamonte Sakai next available after your studies to review the results together. ?

## 2021-05-20 NOTE — Progress Notes (Signed)
? ?Subjective:  ? ? Patient ID: Cassandra Alexander, female    DOB: 14-Jul-1945, 76 y.o.   MRN: [id number] ? ?HPI ?75 year old former smoker (30+ pack years) with a history of cerebral aneurysmal coiling, hyperlipidemia, IBS, diverticular disease, OA, skin CA. Has been followed by Dr Luan Pulling at [facility] for constipation, esophageal dilation for dysphagia done last month. 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  ?She reports that she was treated for PNA in 02/2021, had severe cough, dyspnea and took her months to get over it. She still gets some focal L sided pleuritic type pain. She has not noticed any dyspnea, but notes that she is not very active.  ? ?CT chest 05/01/2021 reviewed by me, shows 1.4 x 1.2 anterior left upper lobe rounded pulmonary nodule with some bandemia towards the pleural surface.  There is similar left lower lobe bandlike atelectasis and scar, less nodular. ? ?Fransisco Beau >> 44.9% ? ? ?Review of Systems ?As per HPI ? ?Past Medical History:  ?Diagnosis Date  ? Anxiety   ? Cerebral aneurysm 2006  ? s/p  coiling right side  ("find during an MRI for headaches")   PCOM  ? Depression   ? Diverticula of colon   ? Hyperlipidemia   ? IBS (irritable bowel syndrome)   ? Osteoarthritis   ? knees, back, hands, hips  ? Primary localized osteoarthritis of left knee 05/16/2018  ? Wears glasses   ?  ? ?No family history on file.  ?No hx lung CA.  ? ?Social History  ? ?Socioeconomic History  ? Marital status: Married  ?  Spouse name: Not on file  ? Number of children: Not on file  ? Years of education: Not on file  ? Highest education level: Not on file  ?Occupational History  ? Not on file  ?Tobacco Use  ? Smoking status: Former  ?  Years: 20.00  ?  Types: Cigarettes  ?  Quit date: 03/15/1990  ?  Years since quitting: 31.2  ? Smokeless tobacco: Never  ?Vaping Use  ? Vaping Use: Never used  ?Substance and Sexual Activity  ? Alcohol use: Not Currently  ? Drug use: Never  ? Sexual activity: Not on file  ?Other Topics Concern  ?  Not on file  ?Social History Narrative  ? Not on file  ? ?Social Determinants of Health  ? ?Financial Resource Strain: Not on file  ?Food Insecurity: Not on file  ?Transportation Needs: Not on file  ?Physical Activity: Not on file  ?Stress: Not on file  ?Social Connections: Not on file  ?Intimate Partner Violence: Not on file  ?  ? ?Allergies  ?Allergen Reactions  ? Cortisone Swelling  ?  Caused facial swelling and headache after receiving cortisone injection  ? Penicillins Rash  ?  Childhood reaction ?Has patient had a PCN reaction causing immediate rash, facial/tongue/throat swelling, SOB or lightheadedness with hypotension: Yes ?Has patient had a PCN reaction causing severe rash involving mucus membranes or skin necrosis: No ?Has patient had a PCN reaction that required hospitalization No ?Has patient had a PCN reaction occurring within the last 10 years: No ?If all of the above answers are "NO", then may proceed with Cephalosporin use. ?  ?  ? ?Outpatient Medications Prior to Visit  ?Medication Sig Dispense Refill  ? ALPRAZolam (XANAX) 1 MG tablet TAKE 1 TABLET BY MOUTH THREE TIMES DAILY AS NEEDED (Patient taking differently: Take 1-1.5 mg by mouth at bedtime.) 90 tablet 5  ?  citalopram (CELEXA) 10 MG tablet Take 10 mg by mouth at bedtime.    ? Difluprednate 0.05 % EMUL Apply to eye.    ? imipramine (TOFRANIL) 50 MG tablet TAKE 3 TABLETS AT BEDTIME (Patient taking differently: Take 150 mg by mouth at bedtime.) 180 tablet 6  ? mesalamine (LIALDA) 1.2 g EC tablet     ? moxifloxacin (VIGAMOX) 0.5 % ophthalmic solution Place 1 drop into the left eye 4 (four) times daily.    ? pantoprazole (PROTONIX) 40 MG tablet Take 40 mg by mouth every morning.    ? PREDNISOLONE ACETATE P-F 1 % ophthalmic suspension Place into the left eye.    ? pregabalin (LYRICA) 50 MG capsule Take 1 capsule (50 mg total) by mouth 3 (three) times daily. (Patient taking differently: Take 50 mg by mouth 2 (two) times daily.) 21 capsule 0  ?  aspirin 81 MG EC tablet 1 tablet (Patient not taking: Reported on 05/20/2021)    ? aspirin EC 325 MG tablet Take 1 tablet (325 mg total) by mouth 2 (two) times daily. (Patient not taking: Reported on 05/20/2021) 60 tablet 0  ? baclofen (LIORESAL) 10 MG tablet Take 1 tablet (10 mg total) by mouth 3 (three) times daily. As needed for muscle spasm (Patient not taking: Reported on 05/20/2021) 50 tablet 0  ? ?No facility-administered medications prior to visit.  ? ? ? ? ?   ?Objective:  ? Physical Exam ? ?Vitals:  ? 05/20/21 1038  ?BP: 124/70  ?Pulse: 81  ?Temp: (!) 97.3 ?F (36.3 ?C)  ?TempSrc: Oral  ?SpO2: 99%  ?Weight: 114 lb 3.2 oz (51.8 kg)  ?Height: 5' (1.524 m)  ? ?Gen: Pleasant, well-nourished, in no distress,  normal affect ? ?ENT: No lesions,  mouth clear,  oropharynx clear, no postnasal drip ? ?Neck: No JVD, no stridor ? ?Lungs: No use of accessory muscles, no crackles or wheezing on normal respiration, no wheeze on forced expiration ? ?Cardiovascular: RRR, heart sounds normal, no murmur or gallops, no peripheral edema ? ?Musculoskeletal: No deformities, no cyanosis or clubbing ? ?Neuro: alert, awake, non focal ? ?Skin: Warm, no lesions or rash ?   ?Assessment & Plan:  ? ?Pulmonary nodule 1 cm or greater in diameter ?Anterior left upper lobe pulmonary nodule, new compared with 2020.  At least intermediate risk for malignancy Fransisco Beau 44.9%).  Believe it needs further evaluation now with PET scan.  Talked about the option for possible primary resection versus navigational bronchoscopy to further evaluate depending on risk stratification.  Also discussed [employer] trial with her - she is interested in participating.  ? ?We will arrange for a PET scan to further evaluate your pulmonary nodule ?We will arrange for pulmonary function testing ?We discussed possible participation in a clinical trial to help risk stratify pulmonary nodules. ?Follow with Dr Lamonte Sakai next available after your studies to review the results  together. ? ?Baltazar Apo, MD, PhD ?05/20/2021, 11:18 AM ?[facility] ?[phone] or if no answer before 7:00PM call [phone] ?For any issues after 7:00PM please call eLink [phone] ? ? ? ?

## 2021-05-25 ENCOUNTER — Encounter: Payer: PPO | Admitting: *Deleted

## 2021-05-25 ENCOUNTER — Other Ambulatory Visit: Payer: Self-pay

## 2021-05-25 DIAGNOSIS — R911 Solitary pulmonary nodule: Secondary | ICD-10-CM

## 2021-05-25 DIAGNOSIS — Z006 Encounter for examination for normal comparison and control in clinical research program: Secondary | ICD-10-CM

## 2021-05-26 NOTE — Research (Signed)
Title: NIGHTINGALE: CliNIcal Utility of ManaGement of Patients witH CT and LDCT Identified Pulmonary Nodules UsinG the Percepta NasAL Swab ClassifiEr -- with Familiarization ?  ?Protocol #: [id number] Sponsor: [employer] ?  ?Protocol Revision 1 dated 01Sep2022 and confirmed current on today's visit, IRB approved Revision 1 on 29Dec2022. ?  ?Objectives:  ?Primary: To evaluate if use of the Percepta Nasal Swab test in the diagnostic work up ?of newly identified pulmonary nodules reduces the number of invasive procedures in the ?group classified as low-risk by the test and that are benign as compared to a control ?group managed without a Percepta Nasal Swab test result. ?                  A newly identified nodule is defined as any nodule first identified on imaging ?                  <90 days prior to nasal sample collection that hasn?t undergone a diagnostic ?                   procedure for the management of their index nodule prior to enrollment. ?                  CT imaging includes conventional CT, LDCT, HRCT ?                  Benign diagnosis is defined as a specific diagnosis of a benign condition, ?                   radiographic resolution or stability at ? 24 months, or no cytological, ?                   radiological, or pathological evidence of cancer. ?                  Procedures will be categorized as either invasive or non-invasive in the Data ?                   Management Plan (DMP). ?Secondary: To evaluate if use of the Percepta Nasal Swab test in the diagnostic work ?up of newly identified pulmonary nodules increases the proportion of subjects classified ?as high-risk by the test and have primary lung cancer that go directly to appropriate ?therapy as compared to a control group managed without a Percepta Nasal Swab test ?result. ?                   Proportion of subjects that go directly to appropriate therapy is defined as those ?                    subjects that undergo surgery, ablative  or other appropriate therapy as the next ?                    step after the Percepta Nasal Swab test result without intervening non-surgical ?                    procedures ?                            a. Non-surgical procedures include diagnostic PET, but not PET for ?                                  staging purposes. ?                            b. Appropriate therapies will be defined in the CRF. ?                   A newly identified nodule is defined as any nodule first identified on imaging ?                   <90 days prior to nasal sample collection. ?                   Lung cancer diagnosis is defined as established by cytology or pathology, or in ?                    circumstances where a presumptive diagnosis of cancer led to definitive ?                    ablative or other appropriate therapy without pathology. ?Key Inclusion Criteria: ? Inclusion Criteria: ? Able to tolerate nasal epithelial specimen collection ? Signed written Informed Consent obtained ? Subject clinical history available for review by sponsor and regulatory agencies ? New nodule first identified on imaging < 90 days prior to nasal sample collection ?(index nodule) ? CT report available for index nodule ? 109 - 66 years of age ? Current or former smoker (>100 cigarettes in a lifetime) ? Pulmonary nodule ?30 mm detected by CT ? ?Key Exclusion Criteria: ?Exclusion Criteria ? Subject has undergone a diagnostic procedure for the management of their ?index nodule after the index CT and prior to enrollment ? Active cancer (other than non-melanoma skin cancer) ? Prior primary lung cancer (prior non-lung cancer acceptable) ? Prior participation in this study (i.e., subjects may not be enrolled more than ?once) ? Current active treatment with an investigational device or drug (patients in trial ?follow up period are okay if intervention phase is complete) ? Patient enrolled or planned to be enrolled in another clinical trial that may ?influence  management of the patient?s nodule ? Concurrent or planned use of tools or tests for assigning lung nodule risk of ?malignancy (e.g., genomic or proteomic blood tests) other than clinically ?validated risk calculators ? ?Clinical Research Coordinator / Research RN note : This visit is for enrollment /baseline Subject [id number] with DOB: [id number] on [id number] for the above protocol is an Enrollment Visit and is for purpose of research.  ?  ?Subject expressed interest and consent in continuing as a study subject. Subject confirmed contact information (e.g. address, telephone, email). Subject thanked for participation in research and contribution to science.   ?  ?During this visit on 13Mar2023  , the subject reviewed and signed the consent form, provided demographics, and had a nasal swab collected per the above referenced protocol. Please refer to the subject's paper source binder for further details.  ? ? ?The PI and Sub-I met/discussed the subject prior to consenting patient.  The sub-Investigator  Dr. Unice Cobble was present for the consenting process.  ?     ?  ?Signed by ?Jaye Beagle RN, CRN II ? ?

## 2021-05-26 NOTE — Progress Notes (Signed)
Cassandra Alexander, DOB 11/24/1945, was seen as subject in a clinical trial /Protocol #[id number] ?Inclusion / exclusion criteria reviewed.  All questions of subject related to the clinical trial answered.  No physical exam completed.  Documents signed.                                                                    Hendricks Limes MD,SI ? ?

## 2021-06-08 ENCOUNTER — Encounter (HOSPITAL_COMMUNITY)
Admission: RE | Admit: 2021-06-08 | Discharge: 2021-06-08 | Disposition: A | Discharge status: Home / Self Care | Payer: PPO | Source: Ambulatory Visit | Attending: Emergency Medicine | Admitting: Emergency Medicine

## 2021-06-08 ENCOUNTER — Other Ambulatory Visit: Payer: Self-pay

## 2021-06-08 DIAGNOSIS — N281 Cyst of kidney, acquired: Secondary | ICD-10-CM | POA: Diagnosis not present

## 2021-06-08 DIAGNOSIS — M419 Scoliosis, unspecified: Secondary | ICD-10-CM | POA: Diagnosis not present

## 2021-06-08 DIAGNOSIS — R911 Solitary pulmonary nodule: Secondary | ICD-10-CM | POA: Diagnosis not present

## 2021-06-08 DIAGNOSIS — J432 Centrilobular emphysema: Secondary | ICD-10-CM | POA: Diagnosis not present

## 2021-06-08 DIAGNOSIS — I2510 Atherosclerotic heart disease of native coronary artery without angina pectoris: Secondary | ICD-10-CM | POA: Diagnosis not present

## 2021-06-08 LAB — GLUCOSE, CAPILLARY: Glucose-Capillary: 111 mg/dL — ABNORMAL HIGH (ref 70–99)

## 2021-06-08 MED ORDER — FLUDEOXYGLUCOSE F - 18 (FDG) INJECTION
6.0000 | Freq: Once | INTRAVENOUS | Status: AC | PRN
  Administered 2021-06-08: 5.65 via INTRAVENOUS

## 2021-07-13 DIAGNOSIS — H2512 Age-related nuclear cataract, left eye: Secondary | ICD-10-CM | POA: Diagnosis not present

## 2021-07-14 DIAGNOSIS — H2511 Age-related nuclear cataract, right eye: Secondary | ICD-10-CM | POA: Diagnosis not present

## 2021-07-27 DIAGNOSIS — F320 Major depressive disorder, single episode, mild: Secondary | ICD-10-CM | POA: Diagnosis not present

## 2021-07-27 DIAGNOSIS — I700 Atherosclerosis of aorta: Secondary | ICD-10-CM | POA: Diagnosis not present

## 2021-07-27 DIAGNOSIS — M5459 Other low back pain: Secondary | ICD-10-CM | POA: Diagnosis not present

## 2021-07-27 DIAGNOSIS — M150 Primary generalized (osteo)arthritis: Secondary | ICD-10-CM | POA: Diagnosis not present

## 2021-07-27 DIAGNOSIS — K2930 Chronic superficial gastritis without bleeding: Secondary | ICD-10-CM | POA: Diagnosis not present

## 2021-07-27 DIAGNOSIS — F411 Generalized anxiety disorder: Secondary | ICD-10-CM | POA: Diagnosis not present

## 2021-07-29 DIAGNOSIS — K5100 Ulcerative (chronic) pancolitis without complications: Secondary | ICD-10-CM | POA: Diagnosis not present

## 2021-07-29 DIAGNOSIS — K219 Gastro-esophageal reflux disease without esophagitis: Secondary | ICD-10-CM | POA: Diagnosis not present

## 2021-08-17 DIAGNOSIS — H2511 Age-related nuclear cataract, right eye: Secondary | ICD-10-CM | POA: Diagnosis not present

## 2021-09-03 DIAGNOSIS — I10 Essential (primary) hypertension: Secondary | ICD-10-CM | POA: Diagnosis not present

## 2021-09-03 DIAGNOSIS — F17200 Nicotine dependence, unspecified, uncomplicated: Secondary | ICD-10-CM | POA: Diagnosis not present

## 2021-09-03 DIAGNOSIS — M545 Low back pain, unspecified: Secondary | ICD-10-CM | POA: Diagnosis not present

## 2021-09-17 DIAGNOSIS — M25511 Pain in right shoulder: Secondary | ICD-10-CM | POA: Diagnosis not present

## 2021-09-17 DIAGNOSIS — M40292 Other kyphosis, cervical region: Secondary | ICD-10-CM | POA: Diagnosis not present

## 2021-09-17 DIAGNOSIS — M9905 Segmental and somatic dysfunction of pelvic region: Secondary | ICD-10-CM | POA: Diagnosis not present

## 2021-09-17 DIAGNOSIS — M9901 Segmental and somatic dysfunction of cervical region: Secondary | ICD-10-CM | POA: Diagnosis not present

## 2021-09-17 DIAGNOSIS — M9902 Segmental and somatic dysfunction of thoracic region: Secondary | ICD-10-CM | POA: Diagnosis not present

## 2021-09-25 ENCOUNTER — Emergency Department (HOSPITAL_COMMUNITY): Payer: PPO

## 2021-09-25 ENCOUNTER — Observation Stay (HOSPITAL_COMMUNITY)
Admission: EM | Admit: 2021-09-25 | Discharge: 2021-09-27 | Disposition: A | Discharge status: Home Health | Payer: PPO | Attending: Family Medicine | Admitting: Family Medicine

## 2021-09-25 ENCOUNTER — Encounter (HOSPITAL_COMMUNITY): Payer: Self-pay

## 2021-09-25 ENCOUNTER — Other Ambulatory Visit: Payer: Self-pay

## 2021-09-25 DIAGNOSIS — G8190 Hemiplegia, unspecified affecting unspecified side: Secondary | ICD-10-CM | POA: Diagnosis not present

## 2021-09-25 DIAGNOSIS — K589 Irritable bowel syndrome without diarrhea: Secondary | ICD-10-CM | POA: Diagnosis present

## 2021-09-25 DIAGNOSIS — F32A Depression, unspecified: Secondary | ICD-10-CM

## 2021-09-25 DIAGNOSIS — Q394 Esophageal web: Secondary | ICD-10-CM

## 2021-09-25 DIAGNOSIS — Z87891 Personal history of nicotine dependence: Secondary | ICD-10-CM | POA: Insufficient documentation

## 2021-09-25 DIAGNOSIS — Z9889 Other specified postprocedural states: Secondary | ICD-10-CM

## 2021-09-25 DIAGNOSIS — F419 Anxiety disorder, unspecified: Secondary | ICD-10-CM | POA: Diagnosis not present

## 2021-09-25 DIAGNOSIS — M545 Low back pain, unspecified: Secondary | ICD-10-CM | POA: Diagnosis present

## 2021-09-25 DIAGNOSIS — K2950 Unspecified chronic gastritis without bleeding: Secondary | ICD-10-CM | POA: Diagnosis present

## 2021-09-25 DIAGNOSIS — R0902 Hypoxemia: Secondary | ICD-10-CM | POA: Diagnosis not present

## 2021-09-25 DIAGNOSIS — K219 Gastro-esophageal reflux disease without esophagitis: Secondary | ICD-10-CM | POA: Diagnosis not present

## 2021-09-25 DIAGNOSIS — Z9049 Acquired absence of other specified parts of digestive tract: Secondary | ICD-10-CM

## 2021-09-25 DIAGNOSIS — I6782 Cerebral ischemia: Secondary | ICD-10-CM | POA: Diagnosis not present

## 2021-09-25 DIAGNOSIS — R29818 Other symptoms and signs involving the nervous system: Secondary | ICD-10-CM | POA: Diagnosis not present

## 2021-09-25 DIAGNOSIS — Z79899 Other long term (current) drug therapy: Secondary | ICD-10-CM | POA: Insufficient documentation

## 2021-09-25 DIAGNOSIS — I639 Cerebral infarction, unspecified: Principal | ICD-10-CM | POA: Diagnosis present

## 2021-09-25 DIAGNOSIS — R531 Weakness: Secondary | ICD-10-CM | POA: Diagnosis not present

## 2021-09-25 DIAGNOSIS — I5189 Other ill-defined heart diseases: Secondary | ICD-10-CM | POA: Diagnosis not present

## 2021-09-25 DIAGNOSIS — M150 Primary generalized (osteo)arthritis: Secondary | ICD-10-CM | POA: Diagnosis present

## 2021-09-25 DIAGNOSIS — K5100 Ulcerative (chronic) pancolitis without complications: Secondary | ICD-10-CM | POA: Diagnosis present

## 2021-09-25 DIAGNOSIS — Z20822 Contact with and (suspected) exposure to covid-19: Secondary | ICD-10-CM | POA: Insufficient documentation

## 2021-09-25 DIAGNOSIS — K51019 Ulcerative (chronic) pancolitis with unspecified complications: Secondary | ICD-10-CM

## 2021-09-25 DIAGNOSIS — Z8679 Personal history of other diseases of the circulatory system: Secondary | ICD-10-CM

## 2021-09-25 DIAGNOSIS — I10 Essential (primary) hypertension: Secondary | ICD-10-CM | POA: Diagnosis present

## 2021-09-25 DIAGNOSIS — Z7982 Long term (current) use of aspirin: Secondary | ICD-10-CM | POA: Insufficient documentation

## 2021-09-25 DIAGNOSIS — Z96653 Presence of artificial knee joint, bilateral: Secondary | ICD-10-CM | POA: Diagnosis not present

## 2021-09-25 DIAGNOSIS — R7303 Prediabetes: Secondary | ICD-10-CM | POA: Diagnosis not present

## 2021-09-25 DIAGNOSIS — Z8673 Personal history of transient ischemic attack (TIA), and cerebral infarction without residual deficits: Secondary | ICD-10-CM | POA: Insufficient documentation

## 2021-09-25 DIAGNOSIS — I63531 Cerebral infarction due to unspecified occlusion or stenosis of right posterior cerebral artery: Secondary | ICD-10-CM | POA: Diagnosis not present

## 2021-09-25 DIAGNOSIS — R1310 Dysphagia, unspecified: Secondary | ICD-10-CM

## 2021-09-25 LAB — RESP PANEL BY RT-PCR (FLU A&B, COVID) ARPGX2
Influenza A by PCR: NEGATIVE
Influenza B by PCR: NEGATIVE
SARS Coronavirus 2 by RT PCR: NEGATIVE

## 2021-09-25 LAB — COMPREHENSIVE METABOLIC PANEL
ALT: 12 U/L (ref 0–44)
AST: 19 U/L (ref 15–41)
Albumin: 4.1 g/dL (ref 3.5–5.0)
Alkaline Phosphatase: 55 U/L (ref 38–126)
Anion gap: 8 (ref 5–15)
BUN: 8 mg/dL (ref 8–23)
CO2: 28 mmol/L (ref 22–32)
Calcium: 9.3 mg/dL (ref 8.9–10.3)
Chloride: 105 mmol/L (ref 98–111)
Creatinine, Ser: 0.67 mg/dL (ref 0.44–1.00)
GFR, Estimated: >60 mL/min (ref >60)
Glucose, Bld: 98 mg/dL (ref 70–99)
Potassium: 4.3 mmol/L (ref 3.5–5.1)
Sodium: 141 mmol/L (ref 135–145)
Total Bilirubin: 0.7 mg/dL (ref 0.3–1.2)
Total Protein: 7 g/dL (ref 6.5–8.1)

## 2021-09-25 LAB — CBC
HCT: 45.7 % (ref 36.0–46.0)
Hemoglobin: 14.4 g/dL (ref 12.0–15.0)
MCH: 29.5 pg (ref 26.0–34.0)
MCHC: 31.5 g/dL (ref 30.0–36.0)
MCV: 93.6 fL (ref 80.0–100.0)
Platelets: 312 10*3/uL (ref 150–400)
RBC: 4.88 MIL/uL (ref 3.87–5.11)
RDW: 13.2 % (ref 11.5–15.5)
WBC: 6.1 10*3/uL (ref 4.0–10.5)
nRBC: 0 % (ref 0.0–0.2)

## 2021-09-25 LAB — URINALYSIS, ROUTINE W REFLEX MICROSCOPIC
Bilirubin Urine: NEGATIVE
Glucose, UA: NEGATIVE mg/dL
Hgb urine dipstick: NEGATIVE
Ketones, ur: 5 mg/dL — AB
Leukocytes,Ua: NEGATIVE
Nitrite: NEGATIVE
Protein, ur: NEGATIVE mg/dL
Specific Gravity, Urine: >1.046 — ABNORMAL HIGH (ref 1.005–1.030)
pH: 7 (ref 5.0–8.0)

## 2021-09-25 LAB — DIFFERENTIAL
Abs Immature Granulocytes: 0.01 10*3/uL (ref 0.00–0.07)
Basophils Absolute: 0 10*3/uL (ref 0.0–0.1)
Basophils Relative: 0 %
Eosinophils Absolute: 0 10*3/uL (ref 0.0–0.5)
Eosinophils Relative: 0 %
Immature Granulocytes: 0 %
Lymphocytes Relative: 34 %
Lymphs Abs: 2.1 10*3/uL (ref 0.7–4.0)
Monocytes Absolute: 0.4 10*3/uL (ref 0.1–1.0)
Monocytes Relative: 6 %
Neutro Abs: 3.6 10*3/uL (ref 1.7–7.7)
Neutrophils Relative %: 60 %

## 2021-09-25 LAB — I-STAT CHEM 8, ED
BUN: 11 mg/dL (ref 8–23)
Calcium, Ion: 1.07 mmol/L — ABNORMAL LOW (ref 1.15–1.40)
Chloride: 104 mmol/L (ref 98–111)
Creatinine, Ser: 0.6 mg/dL (ref 0.44–1.00)
Glucose, Bld: 100 mg/dL — ABNORMAL HIGH (ref 70–99)
HCT: 45 % (ref 36.0–46.0)
Hemoglobin: 15.3 g/dL — ABNORMAL HIGH (ref 12.0–15.0)
Potassium: 4.3 mmol/L (ref 3.5–5.1)
Sodium: 140 mmol/L (ref 135–145)
TCO2: 27 mmol/L (ref 22–32)

## 2021-09-25 LAB — RAPID URINE DRUG SCREEN, HOSP PERFORMED
Amphetamines: NOT DETECTED
Barbiturates: NOT DETECTED
Benzodiazepines: POSITIVE — AB
Cocaine: NOT DETECTED
Opiates: POSITIVE — AB
Tetrahydrocannabinol: NOT DETECTED

## 2021-09-25 LAB — APTT: aPTT: 28 seconds (ref 24–36)

## 2021-09-25 LAB — PROTIME-INR
INR: 1 (ref 0.8–1.2)
Prothrombin Time: 12.6 seconds (ref 11.4–15.2)

## 2021-09-25 LAB — ETHANOL: Alcohol, Ethyl (B): <10 mg/dL (ref <10)

## 2021-09-25 LAB — CBG MONITORING, ED: Glucose-Capillary: 93 mg/dL (ref 70–99)

## 2021-09-25 MED ORDER — ACETAMINOPHEN 160 MG/5ML PO SOLN: 650.0000 mg | ORAL | Status: DC | PRN

## 2021-09-25 MED ORDER — CLOPIDOGREL BISULFATE 75 MG PO TABS
75.0000 mg | ORAL_TABLET | Freq: Every day | ORAL | Status: DC
  Administered 2021-09-26 – 2021-09-27 (×2): 75 mg via ORAL
  Filled 2021-09-25 (×2): qty 1

## 2021-09-25 MED ORDER — MESALAMINE 1.2 G PO TBEC
2.4000 g | DELAYED_RELEASE_TABLET | Freq: Every day | ORAL | Status: DC
  Administered 2021-09-26 – 2021-09-27 (×2): 2.4 g via ORAL
  Filled 2021-09-25 (×3): qty 2

## 2021-09-25 MED ORDER — ASPIRIN 325 MG PO TABS
325.0000 mg | ORAL_TABLET | Freq: Once | ORAL | Status: AC
Start: 2021-09-25 — End: 2021-09-25
  Administered 2021-09-25: 325 mg via ORAL
  Filled 2021-09-25: qty 1

## 2021-09-25 MED ORDER — IMIPRAMINE HCL 25 MG PO TABS
100.0000 mg | ORAL_TABLET | Freq: Every day | ORAL | Status: DC
  Administered 2021-09-26 (×2): 100 mg via ORAL
  Filled 2021-09-25 (×3): qty 4

## 2021-09-25 MED ORDER — ACETAMINOPHEN 650 MG RE SUPP: 650.0000 mg | RECTAL | Status: DC | PRN

## 2021-09-25 MED ORDER — ACETAMINOPHEN 325 MG PO TABS: 650.0000 mg | ORAL_TABLET | ORAL | Status: DC | PRN

## 2021-09-25 MED ORDER — STROKE: EARLY STAGES OF RECOVERY BOOK: Freq: Once | Status: AC

## 2021-09-25 MED ORDER — IOHEXOL 350 MG/ML SOLN
75.0000 mL | Freq: Once | INTRAVENOUS | Status: AC | PRN
  Administered 2021-09-25: 75 mL via INTRAVENOUS

## 2021-09-25 MED ORDER — SENNOSIDES-DOCUSATE SODIUM 8.6-50 MG PO TABS: 1.0000 | ORAL_TABLET | Freq: Every evening | ORAL | Status: DC | PRN

## 2021-09-25 MED ORDER — CLOPIDOGREL BISULFATE 75 MG PO TABS
300.0000 mg | ORAL_TABLET | Freq: Once | ORAL | Status: AC
  Administered 2021-09-25: 300 mg via ORAL
  Filled 2021-09-25: qty 4

## 2021-09-25 MED ORDER — HYDROCODONE-ACETAMINOPHEN 10-325 MG PO TABS
1.0000 | ORAL_TABLET | ORAL | Status: DC | PRN
  Administered 2021-09-25 – 2021-09-27 (×5): 1 via ORAL
  Filled 2021-09-25 (×5): qty 1

## 2021-09-25 MED ORDER — ATORVASTATIN CALCIUM 80 MG PO TABS
80.0000 mg | ORAL_TABLET | Freq: Every day | ORAL | Status: DC
  Administered 2021-09-26 – 2021-09-27 (×2): 80 mg via ORAL
  Filled 2021-09-25 (×2): qty 1

## 2021-09-25 MED ORDER — ASPIRIN 81 MG PO TBEC
81.0000 mg | DELAYED_RELEASE_TABLET | Freq: Every day | ORAL | Status: DC
  Administered 2021-09-26 – 2021-09-27 (×2): 81 mg via ORAL
  Filled 2021-09-25 (×2): qty 1

## 2021-09-25 MED ORDER — ALPRAZOLAM 0.5 MG PO TABS
0.5000 mg | ORAL_TABLET | Freq: Every day | ORAL | Status: DC
Start: 2021-09-25 — End: 2021-09-27
  Administered 2021-09-25 – 2021-09-26 (×2): 0.5 mg via ORAL
  Filled 2021-09-25 (×2): qty 1

## 2021-09-25 MED ORDER — CITALOPRAM HYDROBROMIDE 10 MG PO TABS
10.0000 mg | ORAL_TABLET | Freq: Every day | ORAL | Status: DC
  Administered 2021-09-25 – 2021-09-26 (×2): 10 mg via ORAL
  Filled 2021-09-25 (×2): qty 1

## 2021-09-25 MED ORDER — PANTOPRAZOLE SODIUM 40 MG PO TBEC
40.0000 mg | DELAYED_RELEASE_TABLET | Freq: Every morning | ORAL | Status: DC
  Administered 2021-09-26 – 2021-09-27 (×2): 40 mg via ORAL
  Filled 2021-09-25 (×2): qty 1

## 2021-09-25 NOTE — H&P (Signed)
History and Physical   Cassandra Alexander SVX:[id number] DOB: 02-27-1946 DOA: 09/25/2021  PCP: Alroy Dust, L.Marlou Sa, MD   Patient coming from: Home  Chief Complaint: Left arm weakness  HPI: [name] is a 76 y.o. female with medical history significant of anxiety, depression, TIA, cerebral aneurysm status post coiling, hypertension, gastritis, dysphagia, esophageal web, GERD, partial colectomy, IBS presenting with left-sided weakness.  Patient reporting left-sided weakness starting this morning.  She first noticed it when she tried to pick up her coffee cup with her left hand and spilled it.  Denies any other focal deficits.  Reports last normal that she knows of was last night.  Does report right sided headache. Denies fevers, chills, chest pain, shortness of breath, abdominal pain, constipation, diarrhea, nausea, vomiting.  ED Course: Vital signs in the ED significant for blood pressure in the 233A to 076A systolic.  Lab work-up included CMP which was within normal limits.  CBC within normal limits.  PT, PTT, INR within normal limits.  Ethanol level negative.  UDS positive for opiates and benzos.  Urinalysis normal.  CT head with no acute abnormality.  CTA head and neck without any evidence of large vessel occlusion or hemodynamically significant stenosis.  Did show patient status post coiled aneurysm.  MRI brain showed ischemic infarct and associated petechial blood products without hemorrhagic conversion.  Neurology is consulted and will see the patient.  Review of Systems: As per HPI otherwise all other systems reviewed and are negative.  Past Medical History:  Diagnosis Date   Anxiety    Cerebral aneurysm 2006   s/p  coiling right side  ("find during an MRI for headaches")   PCOM   Depression    Diverticula of colon    Headache 07/07/2015   Hyperlipidemia    IBS (irritable bowel syndrome)    Left sided numbness 07/07/2015   Osteoarthritis    knees, back, hands, hips   Primary localized  osteoarthritis of left knee 05/16/2018   Primary localized osteoarthritis of left knee 05/16/2018   Pulmonary nodule 1 cm or greater in diameter 05/20/2021   S/P left unicompartmental knee replacement 05/16/2018   TIA (transient ischemic attack) 07/07/2015   Wears glasses     Past Surgical History:  Procedure Laterality Date   ANEURYSM COILING  2006 @[facility]    right PCOM   CESAREAN SECTION  x2  last one  1970s   COLONOSCOPY     LUMBAR FUSION  2017   --  dr Trenton Gammon per pt   PARTIAL KNEE ARTHROPLASTY Left 05/16/2018   Procedure: UNICOMPARTMENTAL KNEE;  Surgeon: Marchia Bond, MD;  Location: [facility];  Service: Orthopedics;  Laterality: Left;   RECTAL PROLAPSE REPAIR  2004 approx.  @[facility]    TONSILLECTOMY AND ADENOIDECTOMY  age 57   TOTAL KNEE ARTHROPLASTY Right early 2000s   VAGINAL HYSTERECTOMY  eary age 78s   ovaries remain    Social History  reports that she quit smoking about 31 years ago. Her smoking use included cigarettes. She has never used smokeless tobacco. She reports that she does not currently use alcohol. She reports that she does not use drugs.  Allergies  Allergen Reactions   Cortisone Swelling    Caused facial swelling and headache after receiving cortisone injection   Penicillins Rash    Childhood reaction Has patient had a PCN reaction causing immediate rash, facial/tongue/throat swelling, SOB or lightheadedness with hypotension: Yes Has patient had a PCN reaction causing severe rash involving mucus membranes or skin necrosis:  No Has patient had a PCN reaction that required hospitalization No Has patient had a PCN reaction occurring within the last 10 years: No If all of the above answers are "NO", then may proceed with Cephalosporin use.     Family History  Problem Relation Age of Onset   Hypertension Mother    Osteoarthritis Father   Reviewed on admission  Prior to Admission medications   Medication Sig Start Date End Date Taking? Authorizing Provider  ALPRAZolam  (XANAX) 1 MG tablet TAKE 1 TABLET BY MOUTH THREE TIMES DAILY AS NEEDED Patient taking differently: Take 0.5 mg by mouth at bedtime. 12/24/10  Yes Hoover Browns., MD  citalopram (CELEXA) 10 MG tablet Take 10 mg by mouth at bedtime.   Yes [provider]  HYDROcodone-acetaminophen (NORCO) 10-325 MG tablet Take 1 tablet by mouth every 4 (four) hours as needed for pain. 09/18/21  Yes [provider]  imipramine (TOFRANIL) 50 MG tablet TAKE 3 TABLETS AT BEDTIME Patient taking differently: Take 100 mg by mouth at bedtime. 06/08/10  Yes Hoover Browns., MD  lisinopril (ZESTRIL) 10 MG tablet Take 10 mg by mouth daily. 09/03/21  Yes [provider]  mesalamine (LIALDA) 1.2 g EC tablet Take 2.4 g by mouth daily with breakfast. 04/23/21  Yes [provider]  pantoprazole (PROTONIX) 40 MG tablet Take 40 mg by mouth every morning. 04/10/21  Yes [provider]    Physical Exam: Vitals:   09/25/21 1902 09/25/21 1922 09/25/21 1930 09/25/21 2100  BP:  (!) 150/86 (!) 160/95 (!) 150/105  Pulse: 87 86 80 83  Resp: 17 16 18 17   Temp:      TempSrc:      SpO2: 100% 96% 99% 96%  Weight:      Height:        Physical Exam Constitutional:      General: She is not in acute distress.    Appearance: Normal appearance.  HENT:     Head: Normocephalic and atraumatic.     Mouth/Throat:     Mouth: Mucous membranes are moist.     Pharynx: Oropharynx is clear.  Eyes:     Extraocular Movements: Extraocular movements intact.     Pupils: Pupils are equal, round, and reactive to light.  Cardiovascular:     Rate and Rhythm: Normal rate and regular rhythm.     Pulses: Normal pulses.     Heart sounds: Normal heart sounds.  Pulmonary:     Effort: Pulmonary effort is normal. No respiratory distress.     Breath sounds: Normal breath sounds.  Abdominal:     General: Bowel sounds are normal. There is no distension.     Palpations: Abdomen is soft.      Tenderness: There is no abdominal tenderness.  Musculoskeletal:        General: No swelling or deformity.  Skin:    General: Skin is warm and dry.  Neurological:     Comments: Mental Status: Patient is awake, alert, oriented x3 No signs of aphasia or neglect Cranial Nerves: II: Pupils equal, round, and reactive to light.   III,IV, VI: EOMI without ptosis or diploplia.  V: Facial sensation is symmetric to light touch. VII: Facial movement is symmetric.  VIII: hearing is intact to voice X: Uvula elevates symmetrically XI: Shoulder shrug is symmetric. XII: tongue is midline without atrophy or fasciculations.  Motor: Good effort thorughout, at Least 5/5 RUE, 3-4/5 LUE, 5/5 RLE, 4+/5 LLE bilateral lower  extremitiy  Sensory: Sensation is grossly intact bilateral UEs & LEs Cerebellar: Finger-Nose difficulty on Left, may be partially due to limitations from weakness     Labs on Admission: I have personally reviewed following labs and imaging studies  CBC: Recent Labs  Lab 09/25/21 1009 09/25/21 1033  WBC 6.1  --   NEUTROABS 3.6  --   HGB 14.4 15.3*  HCT 45.7 45.0  MCV 93.6  --   PLT 312  --     Basic Metabolic Panel: Recent Labs  Lab 09/25/21 1009 09/25/21 1033  NA 141 140  K 4.3 4.3  CL 105 104  CO2 28  --   GLUCOSE 98 100*  BUN 8 11  CREATININE 0.67 0.60  CALCIUM 9.3  --     GFR: Estimated Creatinine Clearance: 44.3 mL/min (by C-G formula based on SCr of 0.6 mg/dL).  Liver Function Tests: Recent Labs  Lab 09/25/21 1009  AST 19  ALT 12  ALKPHOS 55  BILITOT 0.7  PROT 7.0  ALBUMIN 4.1    Urine analysis:    Component Value Date/Time   COLORURINE YELLOW 09/25/2021 1445   APPEARANCEUR CLEAR 09/25/2021 1445   LABSPEC >1.046 (H) 09/25/2021 1445   PHURINE 7.0 09/25/2021 1445   GLUCOSEU NEGATIVE 09/25/2021 1445   HGBUR NEGATIVE 09/25/2021 1445   HGBUR negative 03/31/2009 1031   BILIRUBINUR NEGATIVE 09/25/2021 1445   BILIRUBINUR 1+ 06/09/2010 0000    KETONESUR 5 (A) 09/25/2021 1445   PROTEINUR NEGATIVE 09/25/2021 1445   UROBILINOGEN 0.2 06/09/2010 0000   UROBILINOGEN 0.2 03/31/2009 1031   NITRITE NEGATIVE 09/25/2021 1445   LEUKOCYTESUR NEGATIVE 09/25/2021 1445    Radiological Exams on Admission: MR BRAIN WO CONTRAST  Result Date: 09/25/2021 CLINICAL DATA:  Initial evaluation for neuro deficit, stroke suspected. EXAM: MRI HEAD WITHOUT CONTRAST TECHNIQUE: Multiplanar, multiecho pulse sequences of the brain and surrounding structures were obtained without intravenous contrast. COMPARISON:  Prior CTs from earlier the same day. FINDINGS: Brain: Cerebral volume within normal limits. Mild chronic microvascular ischemic disease involving the periventricular white matter and pons noted. Few scattered small remote bilateral cerebellar infarcts. Approximate 2.5 cm curvilinear focus of restricted diffusion extending from the right insula for the right posterior basal ganglia/corona radiata, consistent with an acute ischemic infarct. Associated susceptibility artifact at the level of the right insula consistent with petechial blood products Heidelberg classification 1a: HI1, scattered small petechiae, no mass effect. No visible LV 0 on prior CTA to suggest insula luminal thrombus. No significant mass effect. No other evidence for acute or subacute ischemia. Gray-white matter differentiation otherwise maintained. No mass lesion or midline shift. No hydrocephalus or extra-axial fluid collection. Pituitary gland and suprasellar region within normal limits. Vascular: Major intracranial vascular flow voids are maintained. Skull and upper cervical spine: Craniocervical junction within normal limits. Bone marrow signal intensity somewhat heterogeneous without focal marrow replacing lesion. No scalp soft tissue abnormality. Sinuses/Orbits: Patient status post bilateral ocular lens replacement. Scattered mucosal thickening noted about the sphenoethmoidal sinuses. Paranasal  sinuses are otherwise clear. No mastoid effusion. Other: None. IMPRESSION: 1. Approximate 2.5 cm acute ischemic infarct extending from the right insula to the right posterior basal ganglia/corona radiata. Associated petechial blood products without hemorrhagic transformation or significant mass effect. 2. No other acute intracranial abnormality. 3. Underlying mild chronic microvascular ischemic disease with a few scattered remote bilateral cerebellar infarcts. Electronically Signed   By: Jeannine Boga M.D.   On: 09/25/2021 19:05   CT ANGIO HEAD NECK W WO CM  Result Date: 09/25/2021 CLINICAL DATA:  Stroke/TIA, determine embolic source EXAM: CT ANGIOGRAPHY HEAD AND NECK TECHNIQUE: Multidetector CT imaging of the head and neck was performed using the standard protocol during bolus administration of intravenous contrast. Multiplanar CT image reconstructions and MIPs were obtained to evaluate the vascular anatomy. Carotid stenosis measurements (when applicable) are obtained utilizing NASCET criteria, using the distal internal carotid diameter as the denominator. RADIATION DOSE REDUCTION: This exam was performed according to the departmental dose-optimization program which includes automated exposure control, adjustment of the mA and/or kV according to patient size and/or use of iterative reconstruction technique. CONTRAST:  36m OMNIPAQUE IOHEXOL 350 MG/ML SOLN COMPARISON:  2019 FINDINGS: CTA NECK Aortic arch: Minimal calcified plaque. Great vessel origins are patent. Right carotid system: Patent.  No stenosis. Left carotid system: Patent.  No stenosis. Vertebral arteries: Patent. Right vertebral is dominant. 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 No stenosis. Skeleton: Cervical spine degenerative changes. Other neck: Unremarkable. Upper chest: Emphysema. Review of the MIP images confirms the above findings CTA HEAD Anterior circulation: Intracranial internal carotid arteries are  patent. Streak artifact associated with aneurysm coiling extending inferiorly from the supraclinoid right ICA. Stent markers are present. No evidence of recurrent aneurysm. Anterior and middle cerebral arteries are patent. Posterior circulation: Intracranial vertebral arteries are patent. Basilar artery is patent. Posterior cerebral arteries are patent. Bilateral posterior communicating arteries are present. Mild irregularity of the right P2 PCA. Venous sinuses: Patent as allowed by contrast bolus timing. Review of the MIP images confirms the above findings IMPRESSION: No large vessel occlusion, hemodynamically significant stenosis, or evidence of dissection. Stable appearance of coiled aneurysm with evaluation limited by artifact. Electronically Signed   By: PMacy MisM.D.   On: 09/25/2021 13:03   CT HEAD WO CONTRAST  Result Date: 09/25/2021 CLINICAL DATA:  Neuro deficit, acute, stroke suspected EXAM: CT HEAD WITHOUT CONTRAST TECHNIQUE: Contiguous axial images were obtained from the base of the skull through the vertex without intravenous contrast. RADIATION DOSE REDUCTION: This exam was performed according to the departmental dose-optimization program which includes automated exposure control, adjustment of the mA and/or kV according to patient size and/or use of iterative reconstruction technique. COMPARISON:  CT head 11/06/2015. FINDINGS: Brain: No evidence of acute large vascular territory infarction, hemorrhage, hydrocephalus, extra-axial collection or mass lesion/mass effect. Small remote right cerebellar lacunar infarct. Vascular: Embolization coils in the region of the right paraclinoid ICA aneurysm the surrounding streak artifact. Skull: No acute fracture. Sinuses/Orbits: Clear sinuses.  No acute orbital findings. Other: No mastoid effusions. IMPRESSION: No evidence of acute intracranial abnormality. Electronically Signed   By: FMargaretha SheffieldM.D.   On: 09/25/2021 10:48    EKG: Independently  reviewed.  Normal sinus rhythm at 70 Beats per minute.  Some baseline artifact.  Similar to previous.  Some nonspecific T wave changes.  Assessment/Plan Principal Problem:   Acute CVA (cerebrovascular accident) ([place] Active Problems:   Anxiety and depression   S/P cerebral aneurysm operation   Benign essential hypertension   Chronic gastritis   Dysphagia   Gastroesophageal reflux disease   Esophageal web   History of partial colectomy   Irritable bowel syndrome   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[place]   Primary generalized (osteo)arthritis   Low back pain   Acute CVA > Patient presenting with onset of left upper extremity weakness starting this morning when she first noticed when she dropped her coffee cup.  No other focal deficits reported or noted. > Imaging in the  ED showed normal CT head, stable CTA head and neck as patient is status post coiled aneurysm, MR brain with acute ischemic infarct with some petechial blood products without hemorrhagic conversion. > Neurology consulted in the ED are following - Appreciate neurology recommendations - Allow for permissive HTN (systolic < 329 and diastolic < 518) - ASA 841 mg / 81 mg daily  - Statin  - Echocardiogram  - A1C  - Lipid panel  - Tele monitoring  - SLP eval - PT/OT  Hypertension - Holding home lisinopril in the setting of permissive hypertension as above  Anxiety Depression > Clearly anxious about her diagnosis. - Continue home Celexa, imipramine, Xanax  Gastritis GERD - Continue home PPI  Ulcerative colitis History of partial colectomy IBS - Continue home mesalamine  Chronic pain Osteoarthritis - Continue home Norco  DVT prophylaxis: SCDs Code Status:   Full Family Communication:  Updated at bedside Disposition Plan:   Patient is from:  Home  Anticipated DC to:  Home  Anticipated DC date:  1 to 2 days  Anticipated DC barriers: None  Consults called:  Neurology, consulted in the ED Admission status:   Observation, telemetry  Severity of Illness: The appropriate patient status for this patient is OBSERVATION. Observation status is judged to be reasonable and necessary in order to provide the required intensity of service to ensure the patient's safety. The patient's presenting symptoms, physical exam findings, and initial radiographic and laboratory data in the context of their medical condition is felt to place them at decreased risk for further clinical deterioration. Furthermore, it is anticipated that the patient will be medically stable for discharge from the hospital within 2 midnights of admission.    Marcelyn Bruins MD [facility]  How to contact the [facility] Attending or Consulting provider [place] or covering provider during after hours [place], for this patient?   Check the care team in [facility] and look for a) attending/consulting [facility] provider listed and b) the [facility] team listed Log into [email or web address] and use [place]'s universal password to access. If you do not have the password, please contact the hospital operator. Locate the [facility] provider you are looking for under [facility] and page to a number that you can be directly reached. If you still have difficulty reaching the provider, please page the [facility] (Director on Call) for the Hospitalists listed on amion for assistance.  09/25/2021, 9:05 PM

## 2021-09-25 NOTE — ED Triage Notes (Signed)
Pt to ED via [facility]. Pt was last seen normal at 2200 last night per EMS. Pt was unable to hold coffee in left hand this morning. Pt is holding head to right side and states she is unaware that this is happening.  Left hand grip is weak. Per EMS pt has been like this since she woke up this am at 0700.

## 2021-09-25 NOTE — Discharge Instructions (Signed)
You were seen in the emergency department for left-sided weakness.  You had a CAT scan with and without contrast that did not show any significant abnormalities.  Your MRI showed

## 2021-09-25 NOTE — Consult Note (Signed)
Neurology Consultation Reason for Consult: Stroke  Referring Physician: Demaris Callander  CC: Left arm weakness  History is obtained from: Patient  HPI: Cassandra Alexander is a 76 y.o. female who noticed that her left hand was weak this morning.  She woke up around 7 AM, and did not really notice anything but the first time that she tried to use her left hand it was weak.    LKW: Prior to bed 7/13 tpa given?: no, out of window NIHSS score: 4 1A: Level of Consciousness - 0 1B: Ask Month and Age - 0 1C: 'Blink Eyes' & 'Squeeze Hands' - 0 2: Test Horizontal Extraocular Movements - 0 3: Test Visual Fields - 0 4: Test Facial Palsy - 1 5A: Test Left Arm Motor Drift - 2 5B: Test Right Arm Motor Drift - 0 6A: Test Left Leg Motor Drift - 1 6B: Test Right Leg Motor Drift - 0 7: Test Limb Ataxia - 0 8: Test Sensation - 0 9: Test Language/Aphasia- 0 10: Test Dysarthria - 0 11: Test Extinction/Inattention - 0    Past Medical History:  Diagnosis Date   Anxiety    Cerebral aneurysm 2006   s/p  coiling right side  ("find during an MRI for headaches")   PCOM   Depression    Diverticula of colon    Headache 07/07/2015   Hyperlipidemia    IBS (irritable bowel syndrome)    Left sided numbness 07/07/2015   Osteoarthritis    knees, back, hands, hips   Primary localized osteoarthritis of left knee 05/16/2018   Primary localized osteoarthritis of left knee 05/16/2018   Pulmonary nodule 1 cm or greater in diameter 05/20/2021   S/P left unicompartmental knee replacement 05/16/2018   TIA (transient ischemic attack) 07/07/2015   Wears glasses      Family History  Problem Relation Age of Onset   Hypertension Mother    Osteoarthritis Father      Social History:  reports that she quit smoking about 31 years ago. Her smoking use included cigarettes. She has never used smokeless tobacco. She reports that she does not currently use alcohol. She reports that she does not use drugs.   Exam: Current vital  signs: BP (!) 154/97   Pulse 81   Temp 98.5 F (36.9 C) (Oral)   Resp 18   Ht 5' (1.524 m)   Wt 52.2 kg   SpO2 93%   BMI 22.46 kg/m  Vital signs in last 24 hours: Temp:  [98.5 F (36.9 C)] 98.5 F (36.9 C) (07/14 7711) Pulse Rate:  [72-87] 81 (07/14 2130) Resp:  [16-18] 18 (07/14 2130) BP: (132-186)/(83-115) 154/97 (07/14 2130) SpO2:  [91 %-100 %] 93 % (07/14 2130) Weight:  [52.2 kg] 52.2 kg (07/14 0947)   Physical Exam  Constitutional: Appears well-developed and well-nourished.  Psych: Affect appropriate to situation Eyes: No scleral injection HENT: No OP obstruction MSK: no joint deformities.  Cardiovascular: Normal rate and regular rhythm.  Respiratory: Effort normal, non-labored breathing GI: Soft.  No distension. There is no tenderness.  Skin: WDI  Neuro: Mental Status: Patient is awake, alert, oriented to person, place, month, year, and situation. Patient is able to give a clear and coherent history. No signs of aphasia or neglect Cranial Nerves: II: Visual Fields are full. Pupils are equal, round, and reactive to light.   III,IV, VI: EOMI without ptosis or diploplia.  V: Facial sensation is symmetric to temperature VII: Facial movement with left sided facial weakness  Motor: She has 3/5 left arm weakness 4/5 left leg weakness, good strength in the right Sensory: Sensation is symmetric to light touch and temperature in the arms and legs. Cerebellar: Unable to perform on left, no ataxia on right     I have reviewed labs in epic and the results pertinent to this consultation are: Creatinine 0.67  I have reviewed the images obtained: MRI brain-acute ischemic subcortical infarct in the right  Impression: 76 year old female with acute ischemic infarct.  She will need admission for physical therapy and secondary risk factor modification.  Recommendations: - HgbA1c, fasting lipid panel - MRI of the brain without contrast - Frequent neuro checks -  Echocardiogram - CTA head and neck - Prophylactic therapy-Antiplatelet med: Aspirin - dose 33m and plavix 713mdaily  after 30056moad  - Risk factor modification - Telemetry monitoring - PT consult, OT consult, Speech consult - Stroke team to follow    McNRoland RackD [facility] [phone] 7pm- 7am, please page neurology on call as listed in AMISlater

## 2021-09-25 NOTE — ED Provider Notes (Signed)
MRI demonstrates evidence of acute infarct.  Neurology made aware of MRI results. Patient made aware of MRI results.  Hospitalist service will evaluate for admission.   Valarie Merino, MD 09/25/21 2044

## 2021-09-25 NOTE — ED Notes (Signed)
Patient voiced desire of wanting to leave, informed patient of pending MRI. Called MRI patient will be in transport shortly.

## 2021-09-25 NOTE — ED Notes (Signed)
Patient transported to MRI 

## 2021-09-25 NOTE — ED Provider Triage Note (Addendum)
Emergency Medicine Provider Triage Evaluation Note  Cassandra Alexander , a 76 y.o. female  was evaluated in triage.  Pt complains of left sided weakness.  Has known normal was prior to bed at around 11 PM last night.  Per patient, she woke up this morning and thought she felt well, however per her husband she has been acting abnormal.  Says she had trouble getting down the stairs and almost fell.  She noticed when she was drinking her coffee that she was having trouble lifting the cup on the left side.  She has an associated left-sided headache that is unusual for her.  she denies any visual disturbance, numbness, speech disturbance, facial droop, chest pain, shortness of breath, nausea, vomiting.  Review of Systems  Positive: Weakness, headache Negative:   Physical Exam  BP (!) 160/94 (BP Location: Right Arm)   Pulse 76   Temp 98.5 F (36.9 C) (Oral)   Resp 18   Ht 5' (1.524 m)   Wt 52.2 kg   SpO2 100%   BMI 22.46 kg/m  Gen:   Awake, no distress   Resp:  Normal effort  MSK:   Moves extremities without difficulty  Other:   Alert and Oriented x 3 Speech clear with no aphasia Cranial Nerve testing - Visual Fields grossly intact - PERRLA. EOM intact. No Nystagmus - Facial Sensation grossly intact - No facial asymmetry - Uvula and Tongue Midline - Accessory Muscles intact Motor: - 5/5 on right upper and lower ext. 4/5 on left upper and lower ext - Left upper ext pronator drift Sensation: - Grossly intact in all four extremities.  Coordination:  - Difficulty with coordination on the left due to weakness. Not ataxic     Medical Decision Making  Medically screening exam initiated at 9:56 AM.  Appropriate orders placed.  Cassandra Alexander was informed that the remainder of the evaluation will be completed by another provider, this initial triage assessment does not replace that evaluation, and the importance of remaining in the ED until their evaluation is complete.  Out of TNK window.  VAN negative   Adolphus Birchwood, PA-C 09/25/21 9476    Adolphus Birchwood, PA-C 09/25/21 [phone]

## 2021-09-25 NOTE — ED Provider Notes (Signed)
[facility] EMERGENCY DEPARTMENT Provider Note   CSN: [id number] Arrival date & time: 09/25/21  0934     History  Chief Complaint  Patient presents with   Weakness    Cassandra Alexander is a 76 y.o. female.  She is here with a complaint of some left-sided weakness.  She said she woke up with it this morning and noticed it when she tried to pick up a coffee cup.  She ended up spilling this.  She also has a right temporal frontal headache.  She rates it as 8 out of 10.  No blurry vision double vision no difficulty with speech.  No numbness.  Last known well was 11 PM last night when she went to bed.  No recent illness.  The history is provided by the patient and the spouse.  Cerebrovascular Accident This is a new problem. The current episode started 6 to 12 hours ago. The problem occurs constantly. The problem has not changed since onset.Associated symptoms include headaches. Pertinent negatives include no chest pain, no abdominal pain and no shortness of breath. The symptoms are aggravated by bending and twisting. Nothing relieves the symptoms. She has tried nothing for the symptoms. The treatment provided no relief.       Home Medications Prior to Admission medications   Medication Sig Start Date End Date Taking? Authorizing Provider  ALPRAZolam (XANAX) 1 MG tablet TAKE 1 TABLET BY MOUTH THREE TIMES DAILY AS NEEDED Patient taking differently: Take 1-1.5 mg by mouth at bedtime. 12/24/10   Hoover Browns., MD  aspirin 81 MG EC tablet 1 tablet Patient not taking: Reported on 05/20/2021    [provider]  aspirin EC 325 MG tablet Take 1 tablet (325 mg total) by mouth 2 (two) times daily. Patient not taking: Reported on 05/20/2021 05/16/18   Marchia Bond, MD  baclofen (LIORESAL) 10 MG tablet Take 1 tablet (10 mg total) by mouth 3 (three) times daily. As needed for muscle spasm Patient not taking: Reported on 05/20/2021 05/16/18   Marchia Bond, MD  citalopram  (CELEXA) 10 MG tablet Take 10 mg by mouth at bedtime.    [provider]  Difluprednate 0.05 % EMUL Apply to eye. 05/06/21   [provider]  imipramine (TOFRANIL) 50 MG tablet TAKE 3 TABLETS AT BEDTIME Patient taking differently: Take 150 mg by mouth at bedtime. 06/08/10   Hoover Browns., MD  mesalamine Doristine Johns) 1.2 g EC tablet  04/23/21   [provider]  moxifloxacin (VIGAMOX) 0.5 % ophthalmic solution Place 1 drop into the left eye 4 (four) times daily. 05/07/21   [provider]  pantoprazole (PROTONIX) 40 MG tablet Take 40 mg by mouth every morning. 04/10/21   [provider]  PREDNISOLONE ACETATE P-F 1 % ophthalmic suspension Place into the left eye. 05/07/21   [provider]  pregabalin (LYRICA) 50 MG capsule Take 1 capsule (50 mg total) by mouth 3 (three) times daily. Patient taking differently: Take 50 mg by mouth 2 (two) times daily. 03/18/18   Ashley Murrain, NP      Allergies    Cortisone and Penicillins    Review of Systems   Review of Systems  Constitutional:  Negative for fever.  HENT:  Negative for sore throat.   Eyes:  Negative for visual disturbance.  Respiratory:  Negative for shortness of breath.   Cardiovascular:  Negative for chest pain.  Gastrointestinal:  Negative for abdominal pain.  Genitourinary:  Negative for dysuria.  Musculoskeletal:  Negative for neck pain.  Skin:  Negative for rash.  Neurological:  Positive for weakness and headaches. Negative for speech difficulty and numbness.    Physical Exam Updated Vital Signs BP (!) 160/94 (BP Location: Right Arm)   Pulse 76   Temp 98.5 F (36.9 C) (Oral)   Resp 18   Ht 5' (1.524 m)   Wt 52.2 kg   SpO2 100%   BMI 22.46 kg/m  Physical Exam Vitals and nursing note reviewed.  Constitutional:      General: She is not in acute distress.    Appearance: Normal appearance. She is well-developed.  HENT:     Head: Normocephalic and atraumatic.  Eyes:      Conjunctiva/sclera: Conjunctivae normal.  Cardiovascular:     Rate and Rhythm: Normal rate and regular rhythm.     Heart sounds: No murmur heard. Pulmonary:     Effort: Pulmonary effort is normal. No respiratory distress.     Breath sounds: Normal breath sounds.  Abdominal:     Palpations: Abdomen is soft.     Tenderness: There is no abdominal tenderness. There is no guarding or rebound.  Musculoskeletal:        General: No deformity or signs of injury. Normal range of motion.     Cervical back: Neck supple.  Skin:    General: Skin is warm and dry.     Capillary Refill: Capillary refill takes less than 2 seconds.  Neurological:     Mental Status: She is alert.     Cranial Nerves: No cranial nerve deficit.     Sensory: No sensory deficit.     Motor: Weakness present.     Comments: She has 4 out of 5 weakness left arm and left leg with a little bit of pronator drift     ED Results / Procedures / Treatments   Labs (all labs ordered are listed, but only abnormal results are displayed) Labs Reviewed  RAPID URINE DRUG SCREEN, HOSP PERFORMED - Abnormal; Notable for the following components:      Result Value   Opiates POSITIVE (*)    Benzodiazepines POSITIVE (*)    All other components within normal limits  URINALYSIS, ROUTINE W REFLEX MICROSCOPIC - Abnormal; Notable for the following components:   Specific Gravity, Urine >1.046 (*)    Ketones, ur 5 (*)    All other components within normal limits  I-STAT CHEM 8, ED - Abnormal; Notable for the following components:   Glucose, Bld 100 (*)    Calcium, Ion 1.07 (*)    Hemoglobin 15.3 (*)    All other components within normal limits  RESP PANEL BY RT-PCR (FLU A&B, COVID) ARPGX2  ETHANOL  PROTIME-INR  APTT  CBC  DIFFERENTIAL  COMPREHENSIVE METABOLIC PANEL  CBG MONITORING, ED    EKG EKG Interpretation  Date/Time:  Friday September 25 2021 09:39:11 EDT Ventricular Rate:  75 PR Interval:  126 QRS Duration: 80 QT  Interval:  400 QTC Calculation: 446 R Axis:   10 Text Interpretation: Normal sinus rhythm Normal ECG When compared with ECG of 07-Jul-2015 14:44, No significant change since last tracing Confirmed by Aletta Edouard [phone]) on 09/25/2021 11:12:29 AM  Radiology CT ANGIO HEAD NECK W WO CM  Result Date: 09/25/2021 CLINICAL DATA:  Stroke/TIA, determine embolic source EXAM: CT ANGIOGRAPHY HEAD AND NECK TECHNIQUE: Multidetector CT imaging of the head and neck was performed using the standard protocol during bolus administration of intravenous contrast.  Multiplanar CT image reconstructions and MIPs were obtained to evaluate the vascular anatomy. Carotid stenosis measurements (when applicable) are obtained utilizing NASCET criteria, using the distal internal carotid diameter as the denominator. RADIATION DOSE REDUCTION: This exam was performed according to the departmental dose-optimization program which includes automated exposure control, adjustment of the mA and/or kV according to patient size and/or use of iterative reconstruction technique. CONTRAST:  74m OMNIPAQUE IOHEXOL 350 MG/ML SOLN COMPARISON:  2019 FINDINGS: CTA NECK Aortic arch: Minimal calcified plaque. Great vessel origins are patent. Right carotid system: Patent.  No stenosis. Left carotid system: Patent.  No stenosis. Vertebral arteries: Patent. Right vertebral is dominant. 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 No stenosis. Skeleton: Cervical spine degenerative changes. Other neck: Unremarkable. Upper chest: Emphysema. Review of the MIP images confirms the above findings CTA HEAD Anterior circulation: Intracranial internal carotid arteries are patent. Streak artifact associated with aneurysm coiling extending inferiorly from the supraclinoid right ICA. Stent markers are present. No evidence of recurrent aneurysm. Anterior and middle cerebral arteries are patent. Posterior circulation: Intracranial vertebral arteries are  patent. Basilar artery is patent. Posterior cerebral arteries are patent. Bilateral posterior communicating arteries are present. Mild irregularity of the right P2 PCA. Venous sinuses: Patent as allowed by contrast bolus timing. Review of the MIP images confirms the above findings IMPRESSION: No large vessel occlusion, hemodynamically significant stenosis, or evidence of dissection. Stable appearance of coiled aneurysm with evaluation limited by artifact. Electronically Signed   By: PMacy MisM.D.   On: 09/25/2021 13:03   CT HEAD WO CONTRAST  Result Date: 09/25/2021 CLINICAL DATA:  Neuro deficit, acute, stroke suspected EXAM: CT HEAD WITHOUT CONTRAST TECHNIQUE: Contiguous axial images were obtained from the base of the skull through the vertex without intravenous contrast. RADIATION DOSE REDUCTION: This exam was performed according to the departmental dose-optimization program which includes automated exposure control, adjustment of the mA and/or kV according to patient size and/or use of iterative reconstruction technique. COMPARISON:  CT head 11/06/2015. FINDINGS: Brain: No evidence of acute large vascular territory infarction, hemorrhage, hydrocephalus, extra-axial collection or mass lesion/mass effect. Small remote right cerebellar lacunar infarct. Vascular: Embolization coils in the region of the right paraclinoid ICA aneurysm the surrounding streak artifact. Skull: No acute fracture. Sinuses/Orbits: Clear sinuses.  No acute orbital findings. Other: No mastoid effusions. IMPRESSION: No evidence of acute intracranial abnormality. Electronically Signed   By: FMargaretha SheffieldM.D.   On: 09/25/2021 10:48    Procedures Procedures    Medications Ordered in ED Medications  iohexol (OMNIPAQUE) 350 MG/ML injection 75 mL (75 mLs Intravenous Contrast Given 09/25/21 1247)    ED Course/ Medical Decision Making/ A&P Clinical Course as of 09/25/21 1826  Fri Sep 25, 2021  1126 Reviewed case with  neurology Dr. ARory Percy  He agrees with proceeding with MRI and also needs a CT head and neck. [MB]    Clinical Course User Index [MB] BHayden Rasmussen MD                           Medical Decision Making Amount and/or Complexity of Data Reviewed Radiology: ordered.  Risk Prescription drug management.   This patient complains of weakness left arm and leg; this involves an extensive number of treatment Options and is a complaint that carries with it a high risk of complications and morbidity. The differential includes stroke, bleed, peripheral nerve injury, metabolic derangement, hypoglycemia, complex migraine  I ordered, reviewed  and interpreted labs, which included CBC with normal white count normal hemoglobin, chemistries and LFTs normal, urinalysis without signs of infection  I ordered imaging studies which included CT head, CT angio head and neck, MRI brain and I independently    visualized and interpreted imaging which showed no acute findings.  MRI is pending at time of signout Additional history obtained from patient's spouse and son Previous records obtained and reviewed in epic including prior neurology visits I consulted  neurology Dr.Arora and discussed lab and imaging findings and discussed disposition.  Cardiac monitoring reviewed, normal sinus rhythm Social determinants considered, no significant barriers Critical Interventions: None  After the interventions stated above, I reevaluated the patient and found patient still to be symptomatic with some left-sided weakness Admission and further testing considered, patient is signed out to Dr. Francia Greaves to follow-up on results of MRI brain.  If positive for stroke will need formal neurology input.  If negative can follow-up outpatient with neurology.         Final Clinical Impression(s) / ED Diagnoses Final diagnoses:  Left-sided weakness    Rx / DC Orders ED Discharge Orders          Ordered    Ambulatory  referral to Neurology       Comments: An appointment is requested in approximately: 2 weeks   09/25/21 1605              Hayden Rasmussen, MD 09/25/21 [phone]

## 2021-09-26 ENCOUNTER — Observation Stay (HOSPITAL_BASED_OUTPATIENT_CLINIC_OR_DEPARTMENT_OTHER): Payer: PPO

## 2021-09-26 DIAGNOSIS — F419 Anxiety disorder, unspecified: Secondary | ICD-10-CM | POA: Diagnosis not present

## 2021-09-26 DIAGNOSIS — I6389 Other cerebral infarction: Secondary | ICD-10-CM | POA: Diagnosis not present

## 2021-09-26 DIAGNOSIS — I639 Cerebral infarction, unspecified: Secondary | ICD-10-CM | POA: Diagnosis not present

## 2021-09-26 LAB — LIPID PANEL
Cholesterol: 219 mg/dL — ABNORMAL HIGH (ref 0–200)
HDL: 73 mg/dL (ref >40)
LDL Cholesterol: 133 mg/dL — ABNORMAL HIGH (ref 0–99)
Total CHOL/HDL Ratio: 3 RATIO
Triglycerides: 67 mg/dL (ref <150)
VLDL: 13 mg/dL (ref 0–40)

## 2021-09-26 LAB — ECHOCARDIOGRAM COMPLETE
Area-P 1/2: 3.72 cm2
Height: 60 in
S' Lateral: 2.5 cm
Weight: 1840 oz

## 2021-09-26 LAB — HEMOGLOBIN A1C
Hgb A1c MFr Bld: 5.8 % — ABNORMAL HIGH (ref 4.8–5.6)
Mean Plasma Glucose: 119.76 mg/dL

## 2021-09-26 MED ORDER — ORAL CARE MOUTH RINSE: 15.0000 mL | OROMUCOSAL | Status: DC | PRN

## 2021-09-26 NOTE — Assessment & Plan Note (Signed)
mesalamine

## 2021-09-26 NOTE — Assessment & Plan Note (Addendum)
Imipramine, celexa, xanax

## 2021-09-26 NOTE — Hospital Course (Signed)
Cassandra Alexander is Cassandra Alexander 76 y.o. female with medical history significant of anxiety, depression, TIA, cerebral aneurysm status post coiling, hypertension, gastritis, dysphagia, esophageal web, GERD, partial colectomy, IBS presenting with left-sided weakness.  Imaging confirmed [name] stroke.  She was seen by neurology who recommended DAPTx3 weeks, then aspirin alone.  Lipitor.  She'll need [name] loop recorder outpatient.  Discharged with home health.  See below for additional details.

## 2021-09-26 NOTE — Evaluation (Addendum)
Occupational Therapy Evaluation Patient Details Name: Cassandra Alexander MRN: [id number] DOB: [birth year] Today's Date: 09/26/2021   History of Present Illness Pt is a 76 yr old who presented 7/14 due to L arm weakness. MRI showed  ischemic infarct and associated petechial blood products without hemorrhagic conversion. PJA:[id number], depression, TIA, cerebral aneurysm status post coiling, hypertension, gastritis, dysphagia, GERD, partial colectomy, IBS presenting with left-sided weakness.   Clinical Impression   Pt at PLOF was very independent and lives with husband. Pt's husband works from home and can assist 24/7 and have daughter assist some. Pt at this timed noted to have R cervical and trunk lateral lean to R side and posterior when completion of ADLS while sitting at EOB. Pt needed tactile and verbal cues on increase in ability to remain in midline with sitting and standing tasks. Pt required close CGA to min assist with standing ADLS as noted when crossing had decrease in balance when completion of oral care. Pt was educated about therapies at a [facility] and OP level during session and DME to decrease in fall risk with the return to home.  Pt currently with functional limitations due to the deficits listed below (see OT Problem List).  Pt will benefit from skilled OT to increase their safety and independence with ADL and functional mobility for ADL to facilitate discharge to venue listed below.        Recommendations for follow up therapy are one component of a multi-disciplinary discharge planning process, led by the attending physician.  Recommendations may be updated based on patient status, additional functional criteria and insurance authorization.   Follow Up Recommendations  Home health OT    Assistance Recommended at Discharge Frequent or constant Supervision/Assistance  Patient can return home with the following A little help with walking and/or transfers;A little help with  bathing/dressing/bathroom;Assistance with cooking/housework;Assist for transportation;Direct supervision/assist for medications management;Direct supervision/assist for financial management    Functional Status Assessment  Patient has had a recent decline in their functional status and demonstrates the ability to make significant improvements in function in a reasonable and predictable amount of time.  Equipment Recommendations  BSC/3in1;Tub/shower seat    Recommendations for Other Services       Precautions / Restrictions Precautions Precautions: Fall Restrictions Weight Bearing Restrictions: No      Mobility Bed Mobility Overal bed mobility: Needs Assistance Bed Mobility: Supine to Sit, Sit to Supine     Supine to sit: Supervision Sit to supine: Supervision        Transfers Overall transfer level: Needs assistance Equipment used: Rolling walker (2 wheels) Transfers: Sit to/from Stand Sit to Stand: Min guard                  Balance Overall balance assessment: Needs assistance Sitting-balance support: Feet supported, Bilateral upper extremity supported Sitting balance-Leahy Scale: Fair Sitting balance - Comments: posterior and lateral lean to R side Postural control: Right lateral lean, Posterior lean (Pt needed mod assist when taking medications while sitting at EOB) Standing balance support: Bilateral upper extremity supported, During functional activity Standing balance-Leahy Scale: Fair Standing balance comment: Pt able to lean into sink to provide support but when crossing midline had slight LOB                           ADL either performed or assessed with clinical judgement   ADL Overall ADL's : Needs assistance/impaired Eating/Feeding: Set up;Sitting   Grooming: Oral care;Min  guard;Cueing for safety;Cueing for sequencing;Standing   Upper Body Bathing: Min guard;Cueing for safety;Cueing for sequencing;Sitting   Lower Body Bathing:  Minimal assistance;Cueing for safety;Cueing for sequencing;Sit to/from stand;Sitting/lateral leans   Upper Body Dressing : Min guard;Cueing for safety;Cueing for sequencing;Sitting   Lower Body Dressing: Minimal assistance;Cueing for safety;Cueing for sequencing;Sit to/from stand   Toilet Transfer: Min guard;Cueing for safety;Cueing for sequencing;Rolling walker (2 wheels)   Toileting- Clothing Manipulation and Hygiene: Minimal assistance;Cueing for safety;Cueing for sequencing;Sit to/from stand       Functional mobility during ADLs: Minimal assistance;Rolling walker (2 wheels);Cueing for sequencing;Cueing for safety       Vision Patient Visual Report: No change from baseline (recent cadarct sx)       Perception     Praxis      Pertinent Vitals/Pain Pain Assessment Pain Assessment: No/denies pain     Hand Dominance Right   Extremity/Trunk Assessment Upper Extremity Assessment Upper Extremity Assessment: LUE deficits/detail LUE Deficits / Details: decrease in motor planning/coordination with tasks, decrease ability to grasp/hold onto objects, decrease gross motor control over 90 degrees LUE Sensation: WNL LUE Coordination: decreased fine motor;decreased gross motor   Lower Extremity Assessment Lower Extremity Assessment: Defer to PT evaluation LLE Deficits / Details: B LE weak, but L has increased weakness (4-/5) LLE Sensation: WNL LLE Coordination: decreased gross motor;decreased fine motor   Cervical / Trunk Assessment Cervical / Trunk Assessment: Kyphotic   Communication Communication Communication: No difficulties   Cognition Arousal/Alertness: Awake/alert Behavior During Therapy: WFL for tasks assessed/performed Overall Cognitive Status: Impaired/Different from baseline Area of Impairment: Problem solving, Awareness, Safety/judgement                         Safety/Judgement: Decreased awareness of deficits, Decreased awareness of  safety Awareness: Emergent Problem Solving: Slow processing, Requires verbal cues, Requires tactile cues       General Comments  VSS on RA    Exercises     Shoulder Instructions      Home Living Family/patient expects to be discharged to:: Private residence Living Arrangements: Spouse/significant other Available Help at Discharge: Family;Available 24 hours/day Type of Home: House Home Access: Stairs to enter [employer] of Steps: 4 Entrance Stairs-Rails: Right Home Layout: Multi-level Alternate Level Stairs-Number of Steps: 15 Alternate Level Stairs-Rails: Left;Right Bathroom Shower/Tub: Teacher, early years/pre: Standard     Home Equipment:  (may have a crutch)   Additional Comments: Husband works part time from home; can provide assist      Prior Functioning/Environment Prior Level of Function : Independent/Modified Independent;Driving             Mobility Comments: Amb without AD at baseline ADLs Comments: Ind with ADLs at baseline        OT Problem List: Decreased strength;Decreased range of motion;Decreased activity tolerance;Impaired balance (sitting and/or standing);Decreased coordination;Decreased cognition;Decreased safety awareness;Decreased knowledge of use of DME or AE      OT Treatment/Interventions: Self-care/ADL training;Neuromuscular education;DME and/or AE instruction;Therapeutic activities;Patient/family education;Balance training    OT Goals(Current goals can be found in the care plan section) Acute Rehab OT Goals Patient Stated Goal: to be able to get home OT Goal Formulation: With patient Time For Goal Achievement: 10/10/21 Potential to Achieve Goals: Good ADL Goals Pt Will Perform Eating: Independently;sitting Pt Will Perform Grooming: with modified independence;standing Pt Will Perform Upper Body Bathing: with modified independence;standing Pt Will Perform Lower Body Bathing: with modified independence;sit  to/from stand Pt Will Transfer  to Toilet: with modified independence;ambulating;regular height toilet Pt Will Perform Tub/Shower Transfer: with modified independence;ambulating;shower seat;grab bars;rolling walker  OT Frequency: Min 2X/week    Co-evaluation              AM-PAC OT "6 Clicks" Daily Activity     Outcome Measure Help from another person eating meals?: A Little Help from another person taking care of personal grooming?: A Little Help from another person toileting, which includes using toliet, bedpan, or urinal?: A Little Help from another person bathing (including washing, rinsing, drying)?: A Lot Help from another person to put on and taking off regular upper body clothing?: A Little Help from another person to put on and taking off regular lower body clothing?: A Little 6 Click Score: 17   End of Session Equipment Utilized During Treatment: Gait belt;Rolling walker (2 wheels) Nurse Communication: Mobility status  Activity Tolerance: Patient tolerated treatment well Patient left: in bed;with call bell/phone within reach;with bed alarm set;with family/visitor present  OT Visit Diagnosis: Unsteadiness on feet (R26.81);Other abnormalities of gait and mobility (R26.89);Muscle weakness (generalized) (M62.81);Hemiplegia and hemiparesis Hemiplegia - Right/Left: Left Hemiplegia - dominant/non-dominant: Non-Dominant                Time: 6578-4696 OT Time Calculation (min): 32 min Charges:  OT General Charges $OT Visit: 1 Visit OT Evaluation $OT Eval Low Complexity: 1 Low  Self Care : 8-22 min   Joeseph Amor OTR/L  Acute Rehab Services  [phone] office number [phone] pager number   Joeseph Amor 09/26/2021, 12:57 PM

## 2021-09-26 NOTE — Progress Notes (Signed)
  Echocardiogram 2D Echocardiogram has been performed.  Cassandra Alexander 09/26/2021, 9:45 AM

## 2021-09-26 NOTE — Evaluation (Addendum)
Physical Therapy Evaluation Patient Details Name: Cassandra Alexander MRN: [id number] DOB: Oct 17, 1945 Today's Date: 09/26/2021  History of Present Illness  Pt is a 76 yr old who presented 7/14 due to L arm weakness. MRI showed  ischemic infarct and associated petechial blood products without hemorrhagic conversion. [id number], depression, TIA, cerebral aneurysm status post coiling, hypertension, gastritis, dysphagia, GERD, partial colectomy, IBS presenting with left-sided weakness. (Simultaneous filing. User may not have seen previous data.)  Clinical Impression  PT impairments include generalized weakness (especially L), gait deficits, coordination impairments, and balance deficits. Pt tolerates therapy well today, ambulating household distances with +2 HHA. Pt drags L foot initially during ambulation, but successfully clears foot when PT gives verbal cue to exaggerate lifting the foot. Continued therapy will assist the pt in building strength and improving coordination to reduce her gait deficits, and progress her towards her prior level of independence.     Recommendations for follow up therapy are one component of a multi-disciplinary discharge planning process, led by the attending physician.  Recommendations may be updated based on patient status, additional functional criteria and insurance authorization.  Follow Up Recommendations Home health PT      Assistance Recommended at Discharge Intermittent Supervision/Assistance  Patient can return home with the following  A little help with walking and/or transfers;A little help with bathing/dressing/bathroom;Assistance with cooking/housework;Assist for transportation;Help with stairs or ramp for entrance    Equipment Recommendations Rolling walker (2 wheels)  Recommendations for Other Services       Functional Status Assessment Patient has had a recent decline in their functional status and demonstrates the ability to make significant  improvements in function in a reasonable and predictable amount of time.     Precautions / Restrictions Precautions Precautions: Fall Restrictions Weight Bearing Restrictions: No      Mobility  Bed Mobility Overal bed mobility: Modified Independent                  Transfers Overall transfer level: Needs assistance Equipment used:  (Bed rail) Transfers: Sit to/from Stand Sit to Stand: Supervision           General transfer comment: Pt needs supervision due to LLE weakness    Ambulation/Gait Ambulation/Gait assistance: Min guard Gait Distance (Feet): 150 Feet Assistive device: None, 2 person hand held assist (Began ambulation with no AD; PT implements 2 person HHA to simulate walker - pt reports feeling more stable like this) Gait Pattern/deviations: Step-to pattern, Decreased step length - left, Decreased stride length, Decreased weight shift to left, Decreased dorsiflexion - left, Shuffle Gait velocity: decreased Gait velocity interpretation: <1.31 ft/sec, indicative of household ambulator   General Gait Details: Pt persistently reports feeling safer with 2+ HHA; says if PT wasn't there she would be worried about falling; PT gives cues to exagerate lifting L foot due to dragging/shuffling at initiation of ambulation; Educated pt about using RW at home for increased safety  Stairs            Wheelchair Mobility    Modified Rankin (Stroke Patients Only) Modified Rankin (Stroke Patients Only) Pre-Morbid Rankin Score: No symptoms Modified Rankin: Moderately severe disability     Balance Overall balance assessment: Needs assistance Sitting-balance support: No upper extremity supported, Feet supported Sitting balance-Leahy Scale: Good   Postural control: Other (comment) (R head tilt) Standing balance support: No upper extremity supported, Bilateral upper extremity supported, During functional activity Standing balance-Leahy Scale: Fair Standing balance  comment: Pt maintains static standing without UE support, but  needs UE support for dynamic balance                             Pertinent Vitals/Pain Pain Assessment Pain Assessment: No/denies pain    Home Living Family/patient expects to be discharged to:: Private residence Living Arrangements: Spouse/significant other Available Help at Discharge: Family;Available 24 hours/day Type of Home: House Home Access: Stairs to enter Entrance Stairs-Rails: Right Entrance Stairs-Number of Steps: 4 Alternate Level Stairs-Number of Steps: 15 Home Layout: Multi-level Home Equipment: Other (comment) (Reports she may have a crutch or two laying around) Additional Comments: Husband works part time from home; can provide assist    Prior Function Prior Level of Function : Independent/Modified Independent;Driving             Mobility Comments: Amb without AD at baseline ADLs Comments: Ind with ADLs at baseline     Hand Dominance        Extremity/Trunk Assessment   Upper Extremity Assessment Upper Extremity Assessment: Generalized weakness;LUE deficits/detail LUE Deficits / Details: B UE weak, but L has increased weakness (4-/5) LUE Sensation: WNL LUE Coordination: decreased fine motor    Lower Extremity Assessment Lower Extremity Assessment: Generalized weakness;LLE deficits/detail LLE Deficits / Details: B LE weak, but L has increased weakness (4-/5) LLE Sensation: WNL LLE Coordination: decreased gross motor;decreased fine motor    Cervical / Trunk Assessment Cervical / Trunk Assessment: Kyphotic  Communication   Communication: No difficulties  Cognition Arousal/Alertness: Awake/alert Behavior During Therapy: WFL for tasks assessed/performed                                            General Comments General comments (skin integrity, edema, etc.): VSS on RA    Exercises     Assessment/Plan    PT Assessment Patient needs continued PT  services  PT Problem List Decreased strength;Decreased balance;Decreased mobility;Decreased coordination;Decreased knowledge of use of DME       PT Treatment Interventions DME instruction;Gait training;Stair training;Functional mobility training;Therapeutic activities;Therapeutic exercise;Balance training;Patient/family education    PT Goals (Current goals can be found in the Care Plan section)  Acute Rehab PT Goals Patient Stated Goal: Walk without an AD PT Goal Formulation: With patient Time For Goal Achievement: 10/10/21 Potential to Achieve Goals: Good    Frequency Min 4X/week     Co-evaluation               AM-PAC PT "6 Clicks" Mobility  Outcome Measure Help needed turning from your back to your side while in a flat bed without using bedrails?: None Help needed moving from lying on your back to sitting on the side of a flat bed without using bedrails?: None Help needed moving to and from a bed to a chair (including a wheelchair)?: A Little Help needed standing up from a chair using your arms (e.g., wheelchair or bedside chair)?: A Little Help needed to walk in hospital room?: A Little Help needed climbing 3-5 steps with a railing? : A Little 6 Click Score: 20    End of Session Equipment Utilized During Treatment: Gait belt Activity Tolerance: Patient tolerated treatment well Patient left: in bed;with call bell/phone within reach;with bed alarm set;with nursing/sitter in room Nurse Communication: Mobility status PT Visit Diagnosis: Other abnormalities of gait and mobility (R26.89);Muscle weakness (generalized) (M62.81);Difficulty in walking, not elsewhere classified (R26.2)  Time: 1000-1024 PT Time Calculation (min) (ACUTE ONLY): 24 min   Charges:   PT Evaluation $PT Eval Low Complexity: 1 Low PT Treatments $Gait Training: 8-22 mins        Hall Busing, SPT Acute Rehabilitation Office #: [phone]   Hall Busing 09/26/2021, 12:33  PM

## 2021-09-26 NOTE — Care Management Obs Status (Signed)
[facility] NOTIFICATION   Patient Details  Name: YITTEL EMRICH MRN: [id number] Date of Birth: Nov 30, 1945   Medicare Observation Status Notification Given:  Yes    Carles Collet, RN 09/26/2021, 3:13 PM

## 2021-09-26 NOTE — Assessment & Plan Note (Signed)
PPI ?

## 2021-09-26 NOTE — Progress Notes (Signed)
PROGRESS NOTE    BRYANA FROEMMING  HQI:[id number] DOB: Feb 07, 1946 DOA: 09/25/2021 PCP: Alroy Dust, L.Marlou Sa, MD  Chief Complaint  Patient presents with   Weakness    Brief Narrative:  CLYDIE DILLEN is Anjenette Gerbino 76 y.o. female with medical history significant of anxiety, depression, TIA, cerebral aneurysm status post coiling, hypertension, gastritis, dysphagia, esophageal web, GERD, partial colectomy, IBS presenting with left-sided weakness.    Assessment & Plan:   Principal Problem:   Acute CVA (cerebrovascular accident) ([place]) Active Problems:   S/P cerebral aneurysm operation   Benign essential hypertension   Chronic gastritis   Anxiety and depression   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HCC)   Low back pain   Dysphagia   Gastroesophageal reflux disease   Esophageal web   History of partial colectomy   Irritable bowel syndrome   Primary generalized (osteo)arthritis   Assessment and Plan: * Acute CVA (cerebrovascular accident) ([place]) MRI with 2.5 cm acute ischemic infarct extending from thr right insula to the right posterior basal ganglia/corona radiata, associated petechial blood products without hemorrhagic transformation or significant mass effect CTA without LVO, hemodynamically significant stenosis or evidence of dissection Echo with EF 55-60%, mild asymmetric LV hypertrophy of the basal septal segment, grade 1 diastolic dysfunction LDL 132, a1c 5.8 Neurology c/s, appreciate recs -> DAPT x3 weeks, then aspirin alone.  Will need loop recorder (message sent to card master).  Lipitor.    S/P cerebral aneurysm operation S/p coiling   Benign essential hypertension Lisinopril currently on hold for permissive hypertension  Chronic gastritis PPI  Low back pain norco prn  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HCC) mesalamine  Anxiety and depression Imipramine, celexa, xanax     DVT prophylaxis: SCD Code Status: full Family Communication: husband, son Disposition:   Status is:  Observation The patient remains OBS appropriate and will d/c before 2 midnights.   Consultants:  neurology  Procedures:  Echo IMPRESSIONS     1. Left ventricular ejection fraction, by estimation, is 55 to 60%. The  left ventricle has normal function. The left ventricle has no regional  wall motion abnormalities. There is mild asymmetric left ventricular  hypertrophy of the basal-septal segment.  Left ventricular diastolic parameters are consistent with Grade I  diastolic dysfunction (impaired relaxation).   2. Right ventricular systolic function is normal. The right ventricular  size is normal. There is normal pulmonary artery systolic pressure.   3. The mitral valve is grossly normal. Trivial mitral valve  regurgitation.   4. The aortic valve is tricuspid. There is mild calcification of the  aortic valve. There is mild thickening of the aortic valve. Aortic valve  regurgitation is trivial. Aortic valve sclerosis/calcification is present,  without any evidence of aortic  stenosis.   5. The inferior vena cava is normal in size with greater than 50%  respiratory variability, suggesting right atrial pressure of 3 mmHg.   Comparison(s): No prior Echocardiogram.   Conclusion(s)/Recommendation(s): No intracardiac source of embolism  detected on this transthoracic study. Consider Dawnetta Copenhaver transesophageal  echocardiogram to exclude cardiac source of embolism if clinically  indicated.   Antimicrobials:  Anti-infectives (From admission, onward)    None       Subjective: No new complaints Son and husband at bedside  Objective: Vitals:   09/26/21 0008 09/26/21 0008 09/26/21 0009 09/26/21 0354  BP: (!) 157/88 (!) 161/85 (!) 157/88 (!) 154/96  Pulse: 70 70 72 80  Resp: 20 20 20 18   Temp: 98 F (36.7 C) 98 F (36.7 C) 98  F (36.7 C) 98.2 F (36.8 C)  TempSrc: Oral   Oral  SpO2: 98% 98% 98% 100%  Weight:      Height:        Intake/Output Summary (Last 24 hours) at 09/26/2021  1722 Last data filed at 09/26/2021 0800 Gross per 24 hour  Intake 220 ml  Output --  Net 220 ml   Filed Weights   09/25/21 0947  Weight: 52.2 kg    Examination:  General exam: Appears calm and comfortable  Respiratory system: unlabored Cardiovascular system: RRR Central nervous system: L sided weakness, discoordination of L side Extremities: no LEE    Data Reviewed: I have personally reviewed following labs and imaging studies  CBC: Recent Labs  Lab 09/25/21 1009 09/25/21 1033  WBC 6.1  --   NEUTROABS 3.6  --   HGB 14.4 15.3*  HCT 45.7 45.0  MCV 93.6  --   PLT 312  --     Basic Metabolic Panel: Recent Labs  Lab 09/25/21 1009 09/25/21 1033  NA 141 140  K 4.3 4.3  CL 105 104  CO2 28  --   GLUCOSE 98 100*  BUN 8 11  CREATININE 0.67 0.60  CALCIUM 9.3  --     GFR: Estimated Creatinine Clearance: 44.3 mL/min (by C-G formula based on SCr of 0.6 mg/dL).  Liver Function Tests: Recent Labs  Lab 09/25/21 1009  AST 19  ALT 12  ALKPHOS 55  BILITOT 0.7  PROT 7.0  ALBUMIN 4.1    CBG: Recent Labs  Lab 09/25/21 1007  GLUCAP 93     Recent Results (from the past 240 hour(s))  Resp Panel by RT-PCR (Flu Donnamarie Shankles&B, Covid) Anterior Nasal Swab     Status: None   Collection Time: 09/25/21  9:56 AM   Specimen: Anterior Nasal Swab  Result Value Ref Range Status   SARS Coronavirus 2 by RT PCR NEGATIVE NEGATIVE Final    Comment: (NOTE) SARS-CoV-2 target nucleic acids are NOT DETECTED.  The SARS-CoV-2 RNA is generally detectable in upper respiratory specimens during the acute phase of infection. The lowest concentration of SARS-CoV-2 viral copies this assay can detect is 138 copies/mL. Brianna Esson negative result does not preclude SARS-Cov-2 infection and should not be used as the sole basis for treatment or other patient management decisions. Swayzie Choate negative result may occur with  improper specimen collection/handling, submission of specimen other than nasopharyngeal swab,  presence of viral mutation(s) within the areas targeted by this assay, and inadequate number of viral copies(<138 copies/mL). Jersie Beel negative result must be combined with clinical observations, patient history, and epidemiological information. The expected result is Negative.  Fact Sheet for Patients:  EntrepreneurPulse.com.au  Fact Sheet for Healthcare Providers:  IncredibleEmployment.be  This test is no t yet approved or cleared by the [place] FDA and  has been authorized for detection and/or diagnosis of SARS-CoV-2 by FDA under an Emergency Use Authorization (EUA). This EUA will remain  in effect (meaning this test can be used) for the duration of the COVID-19 declaration under Section 564(b)(1) of the Act, 21 U.S.C.section 360bbb-3(b)(1), unless the authorization is terminated  or revoked sooner.       Influenza Rashon Westrup by PCR NEGATIVE NEGATIVE Final   Influenza B by PCR NEGATIVE NEGATIVE Final    Comment: (NOTE) The Xpert Xpress SARS-CoV-2/FLU/RSV plus assay is intended as an aid in the diagnosis of influenza from Nasopharyngeal swab specimens and should not be used as [place] sole basis for treatment. Nasal washings  and aspirates are unacceptable for Xpert Xpress SARS-CoV-2/FLU/RSV testing.  Fact Sheet for Patients: EntrepreneurPulse.com.au  Fact Sheet for Healthcare Providers: IncredibleEmployment.be  This test is not yet approved or cleared by the [place] FDA and has been authorized for detection and/or diagnosis of SARS-CoV-2 by FDA under an Emergency Use Authorization (EUA). This EUA will remain in effect (meaning this test can be used) for the duration of the COVID-19 declaration under Section 564(b)(1) of the Act, 21 U.S.C. section 360bbb-3(b)(1), unless the authorization is terminated or revoked.  Performed at [facility], [place] [address] [place], [place] [address]           Radiology Studies: ECHOCARDIOGRAM COMPLETE  Result Date: 09/26/2021    ECHOCARDIOGRAM REPORT   Patient Name:   MANUELLA BLACKSON Date of Exam: 09/26/2021 Medical Rec #:  [id number]     Height:       60.0 in Accession #:    [id number]    Weight:       115.0 lb Date of Birth:  [birth year]     BSA:          1.475 m Patient Age:    48 years      BP:           154/96 mmHg Patient Gender: F             HR:           70 bpm. Exam Location:  Inpatient Procedure: 2D Echo Indications:     stroke  History:         Patient has no prior history of Echocardiogram examinations.                  Risk Factors:Hypertension.  Sonographer:     Johny Chess RDCS Referring Phys:  [id number] [place] Diagnosing Phys: Gwyndolyn Kaufman MD IMPRESSIONS  1. Left ventricular ejection fraction, by estimation, is 55 to 60%. The left ventricle has normal function. The left ventricle has no regional wall motion abnormalities. There is mild asymmetric left ventricular hypertrophy of the basal-septal segment. Left ventricular diastolic parameters are consistent with Grade I diastolic dysfunction (impaired relaxation).  2. Right ventricular systolic function is normal. The right ventricular size is normal. There is normal pulmonary artery systolic pressure.  3. The mitral valve is grossly normal. Trivial mitral valve regurgitation.  4. The aortic valve is tricuspid. There is mild calcification of the aortic valve. There is mild thickening of the aortic valve. Aortic valve regurgitation is trivial. Aortic valve sclerosis/calcification is present, without any evidence of aortic stenosis.  5. The inferior vena cava is normal in size with greater than 50% respiratory variability, suggesting right atrial pressure of 3 mmHg. Comparison(s): No prior Echocardiogram. Conclusion(s)/Recommendation(s): No intracardiac source of embolism detected on this transthoracic study. Consider Demetry Bendickson transesophageal echocardiogram to exclude cardiac source  of embolism if clinically indicated. FINDINGS  Left Ventricle: Left ventricular ejection fraction, by estimation, is 55 to 60%. The left ventricle has normal function. The left ventricle has no regional wall motion abnormalities. The left ventricular internal cavity size was normal in size. There is  mild asymmetric left ventricular hypertrophy of the basal-septal segment. Left ventricular diastolic parameters are consistent with Grade I diastolic dysfunction (impaired relaxation). Right Ventricle: The right ventricular size is normal. No increase in right ventricular wall thickness. Right ventricular systolic function is normal. There is normal pulmonary artery systolic pressure. The tricuspid regurgitant velocity is 2.62 m/s, and  with an  assumed right atrial pressure of 3 mmHg, the estimated right ventricular systolic pressure is 14.4 mmHg. Left Atrium: Left atrial size was normal in size. Right Atrium: Right atrial size was normal in size. Pericardium: There is no evidence of pericardial effusion. Mitral Valve: The mitral valve is grossly normal. There is mild thickening of the mitral valve leaflet(s). There is mild calcification of the mitral valve leaflet(s). Mild mitral annular calcification. Trivial mitral valve regurgitation. Tricuspid Valve: The tricuspid valve is normal in structure. Tricuspid valve regurgitation is mild. Aortic Valve: The aortic valve is tricuspid. There is mild calcification of the aortic valve. There is mild thickening of the aortic valve. Aortic valve regurgitation is trivial. Aortic valve sclerosis/calcification is present, without any evidence of aortic stenosis. Pulmonic Valve: The pulmonic valve was normal in structure. Pulmonic valve regurgitation is trivial. Aorta: The aortic root and ascending aorta are structurally normal, with no evidence of dilitation. Venous: The inferior vena cava is normal in size with greater than 50% respiratory variability, suggesting right atrial  pressure of 3 mmHg. IAS/Shunts: The atrial septum is grossly normal.  LEFT VENTRICLE PLAX 2D LVIDd:         4.40 cm   Diastology LVIDs:         2.50 cm   LV e' medial:    4.90 cm/s LV PW:         0.80 cm   LV E/e' medial:  12.9 LV IVS:        1.00 cm   LV e' lateral:   7.72 cm/s LVOT diam:     2.10 cm   LV E/e' lateral: 8.2 LV SV:         51 LV SV Index:   34 LVOT Area:     3.46 cm  RIGHT VENTRICLE             IVC RV S prime:     17.30 cm/s  IVC diam: 0.80 cm TAPSE (M-mode): 1.9 cm LEFT ATRIUM             Index        RIGHT ATRIUM          Index LA diam:        3.00 cm 2.03 cm/m   RA Area:     8.13 cm LA Vol (A2C):   20.9 ml 14.17 ml/m  RA Volume:   15.80 ml 10.71 ml/m LA Vol (A4C):   16.5 ml 11.18 ml/m LA Biplane Vol: 18.7 ml 12.67 ml/m  AORTIC VALVE LVOT Vmax:   75.50 cm/s LVOT Vmean:  46.800 cm/s LVOT VTI:    0.146 m  AORTA Ao Root diam: 3.10 cm Ao Asc diam:  3.10 cm MITRAL VALVE               TRICUSPID VALVE MV Area (PHT): 3.72 cm    TR Peak grad:   27.5 mmHg MV Decel Time: 204 msec    TR Vmax:        262.00 cm/s MV E velocity: 63.20 cm/s MV Atsushi Yom velocity: 99.30 cm/s  SHUNTS MV E/Misao Fackrell ratio:  0.64        Systemic VTI:  0.15 m                            Systemic Diam: 2.10 cm Gwyndolyn Kaufman MD Electronically signed by Gwyndolyn Kaufman MD Signature Date/Time: 09/26/2021/1:11:35 PM    Final (Updated)    MR BRAIN  WO CONTRAST  Result Date: 09/25/2021 CLINICAL DATA:  Initial evaluation for neuro deficit, stroke suspected. EXAM: MRI HEAD WITHOUT CONTRAST TECHNIQUE: Multiplanar, multiecho pulse sequences of the brain and surrounding structures were obtained without intravenous contrast. COMPARISON:  Prior CTs from earlier the same day. FINDINGS: Brain: Cerebral volume within normal limits. Mild chronic microvascular ischemic disease involving the periventricular white matter and pons noted. Few scattered small remote bilateral cerebellar infarcts. Approximate 2.5 cm curvilinear focus of restricted diffusion  extending from the right insula for the right posterior basal ganglia/corona radiata, consistent with an acute ischemic infarct. Associated susceptibility artifact at the level of the right insula consistent with petechial blood products Heidelberg classification 1a: HI1, scattered small petechiae, no mass effect. No visible LV 0 on prior CTA to suggest insula luminal thrombus. No significant mass effect. No other evidence for acute or subacute ischemia. Gray-white matter differentiation otherwise maintained. No mass lesion or midline shift. No hydrocephalus or extra-axial fluid collection. Pituitary gland and suprasellar region within normal limits. Vascular: Major intracranial vascular flow voids are maintained. Skull and upper cervical spine: Craniocervical junction within normal limits. Bone marrow signal intensity somewhat heterogeneous without focal marrow replacing lesion. No scalp soft tissue abnormality. Sinuses/Orbits: Patient status post bilateral ocular lens replacement. Scattered mucosal thickening noted about the sphenoethmoidal sinuses. Paranasal sinuses are otherwise clear. No mastoid effusion. Other: None. IMPRESSION: 1. Approximate 2.5 cm acute ischemic infarct extending from the right insula to the right posterior basal ganglia/corona radiata. Associated petechial blood products without hemorrhagic transformation or significant mass effect. 2. No other acute intracranial abnormality. 3. Underlying mild chronic microvascular ischemic disease with Abhiraj Dozal few scattered remote bilateral cerebellar infarcts. Electronically Signed   By: Jeannine Boga M.D.   On: 09/25/2021 19:05   CT ANGIO HEAD NECK W WO CM  Result Date: 09/25/2021 CLINICAL DATA:  Stroke/TIA, determine embolic source EXAM: CT ANGIOGRAPHY HEAD AND NECK TECHNIQUE: Multidetector CT imaging of the head and neck was performed using the standard protocol during bolus administration of intravenous contrast. Multiplanar CT image  reconstructions and MIPs were obtained to evaluate the vascular anatomy. Carotid stenosis measurements (when applicable) are obtained utilizing NASCET criteria, using the distal internal carotid diameter as the denominator. RADIATION DOSE REDUCTION: This exam was performed according to the departmental dose-optimization program which includes automated exposure control, adjustment of the mA and/or kV according to patient size and/or use of iterative reconstruction technique. CONTRAST:  13m OMNIPAQUE IOHEXOL 350 MG/ML SOLN COMPARISON:  2019 FINDINGS: CTA NECK Aortic arch: Minimal calcified plaque. Great vessel origins are patent. Right carotid system: Patent.  No stenosis. Left carotid system: Patent.  No stenosis. Vertebral arteries: Patent. Right vertebral is dominant. 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 No stenosis. Skeleton: Cervical spine degenerative changes. Other neck: Unremarkable. Upper chest: Emphysema. Review of the MIP images confirms the above findings CTA HEAD Anterior circulation: Intracranial internal carotid arteries are patent. Streak artifact associated with aneurysm coiling extending inferiorly from the supraclinoid right ICA. Stent markers are present. No evidence of recurrent aneurysm. Anterior and middle cerebral arteries are patent. Posterior circulation: Intracranial vertebral arteries are patent. Basilar artery is patent. Posterior cerebral arteries are patent. Bilateral posterior communicating arteries are present. Mild irregularity of the right P2 PCA. Venous sinuses: Patent as allowed by contrast bolus timing. Review of the MIP images confirms the above findings IMPRESSION: No large vessel occlusion, hemodynamically significant stenosis, or evidence of dissection. Stable appearance of coiled aneurysm with evaluation limited by artifact. Electronically Signed  By: Macy Mis M.D.   On: 09/25/2021 13:03   CT HEAD WO CONTRAST  Result Date:  09/25/2021 CLINICAL DATA:  Neuro deficit, acute, stroke suspected EXAM: CT HEAD WITHOUT CONTRAST TECHNIQUE: Contiguous axial images were obtained from the base of the skull through the vertex without intravenous contrast. RADIATION DOSE REDUCTION: This exam was performed according to the departmental dose-optimization program which includes automated exposure control, adjustment of the mA and/or kV according to patient size and/or use of iterative reconstruction technique. COMPARISON:  CT head 11/06/2015. FINDINGS: Brain: No evidence of acute large vascular territory infarction, hemorrhage, hydrocephalus, extra-axial collection or mass lesion/mass effect. Small remote right cerebellar lacunar infarct. Vascular: Embolization coils in the region of the right paraclinoid ICA aneurysm the surrounding streak artifact. Skull: No acute fracture. Sinuses/Orbits: Clear sinuses.  No acute orbital findings. Other: No mastoid effusions. IMPRESSION: No evidence of acute intracranial abnormality. Electronically Signed   By: Margaretha Sheffield M.D.   On: 09/25/2021 10:48        Scheduled Meds:  ALPRAZolam  0.5 mg Oral QHS   aspirin EC  81 mg Oral Daily   atorvastatin  80 mg Oral Daily   citalopram  10 mg Oral QHS   clopidogrel  75 mg Oral Daily   imipramine  100 mg Oral QHS   mesalamine  2.4 g Oral Q breakfast   pantoprazole  40 mg Oral q morning   Continuous Infusions:   LOS: 0 days    Time spent: over 30 min    Fayrene Helper, MD [facility]   To contact the attending provider between 7A-7P or the covering provider during after hours 7P-7A, please log into the web site [email or web address] and access using universal [place] password for that web site. If you do not have the password, please call the hospital operator.  09/26/2021, 5:22 PM

## 2021-09-26 NOTE — Progress Notes (Signed)
OT Cancellation Note  Patient Details Name: KIERRE DEINES MRN: [id number] DOB: 05/26/1945   Cancelled Treatment:    Reason Eval/Treat Not Completed: Patient at procedure or test/ unavailable Will follow.  Joeseph Amor OTR/L  Acute Rehab Services  [phone] office number [phone] pager number   Joeseph Amor 09/26/2021, 9:34 AM

## 2021-09-26 NOTE — Assessment & Plan Note (Signed)
S/p coiling

## 2021-09-26 NOTE — TOC Initial Note (Signed)
Transition of Care [facility]) - Initial/Assessment Note    Patient Details  Name: Cassandra Alexander MRN: [id number] Date of Birth: [birth year]  [facility] CM/SW Contact:    Carles Collet, RN Phone Number: 09/26/2021, 3:17 PM  Clinical Narrative:                Damaris Schooner w patient and family at bedside. Discussed needs for DC, patient has RW and 3/1 has been ordered and will be delivered to the room today. [place] services have been arranged through [facility], and will start Monday. No other TOC needs identified at this time   Expected Discharge Plan: [place] Barriers to Discharge: Continued Medical Work up   Patient Goals and CMS Choice Patient states their goals for this hospitalization and ongoing recovery are:: to go home CMS Medicare.gov Compare Post Acute Care list provided to:: Patient Choice offered to / list presented to : Patient  Expected Discharge Plan and Services Expected Discharge Plan: [place]   Discharge Planning Services: CM Consult Post Acute Care Choice: Home Health, Durable Medical Equipment Living arrangements for the past 2 months: Single Family Home                 DME Arranged: 3-N-1 DME Agency: [employer] Date DME Agency Contacted: 09/26/21 Time DME Agency Contacted: [phone] Representative spoke with at DME Agency: [place]: PT, OT [place] Agency: [place] Date [place]: 09/26/21 Time [place]: [place] Representative spoke with at [place]: Amy  Prior Living Arrangements/Services Living arrangements for the past 2 months: [place] with:: Spouse                   Activities of Daily Living Home Assistive Devices/Equipment: None ADL Screening (condition at time of admission) Patient's cognitive ability adequate to safely complete daily activities?: Yes Is the patient deaf or have difficulty hearing?: No Does the patient have difficulty seeing, even when  wearing glasses/contacts?: No Does the patient have difficulty concentrating, remembering, or making decisions?: No Patient able to express need for assistance with ADLs?: Yes Does the patient have difficulty dressing or bathing?: No Independently performs ADLs?: Yes (appropriate for developmental age) Does the patient have difficulty walking or climbing stairs?: Yes Weakness of Legs: Left Weakness of Arms/Hands: Left  Permission Sought/Granted                  Emotional Assessment              Admission diagnosis:  Left-sided weakness [R53.1] Acute CVA (cerebrovascular accident) [facility]) [I63.9] Patient Active Problem List   Diagnosis Date Noted   Benign essential hypertension 09/25/2021   Chronic gastritis 09/25/2021   Dysphagia 09/25/2021   Gastroesophageal reflux disease 09/25/2021   Esophageal web 09/25/2021   History of partial colectomy 09/25/2021   Irritable bowel syndrome 09/25/2021   Acute CVA (cerebrovascular accident) ([place]) 09/25/2021   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[place]) 09/25/2021   Primary generalized (osteo)arthritis 09/25/2021   Low back pain 09/25/2021   S/P cerebral aneurysm operation    Anxiety and depression 07/07/2015   PCP:  Alroy Dust, L.Marlou Sa, MD Pharmacy:   [id number] [place] [place] [place] [address]Phone: [phone]: [phone]    Social Determinants of Health (SDOH) Interventions    Readmission Risk Interventions     No data to display

## 2021-09-26 NOTE — Assessment & Plan Note (Signed)
MRI with 2.5 cm acute ischemic infarct extending from thr right insula to the right posterior basal ganglia/corona radiata, associated petechial blood products without hemorrhagic transformation or significant mass effect CTA without LVO, hemodynamically significant stenosis or evidence of dissection Echo with EF 55-60%, mild asymmetric LV hypertrophy of the basal septal segment, grade 1 diastolic dysfunction LDL 676, a1c 5.8 Neurology c/s, appreciate recs -> DAPT x3 weeks, then aspirin alone.  Will need loop recorder (message sent to card master).  Lipitor.

## 2021-09-26 NOTE — Assessment & Plan Note (Signed)
norco prn 

## 2021-09-26 NOTE — Assessment & Plan Note (Signed)
Lisinopril currently on hold for permissive hypertension

## 2021-09-26 NOTE — Progress Notes (Addendum)
STROKE TEAM PROGRESS NOTE   INTERVAL HISTORY Her son is at the bedside.  RN at bedside. Patient is awake and alert sitting up in the bed. Per patient left side weakness is a little better today. Patient able to tell a coherent story, oriented x 4. PERRL, tracks, crosses midline. Left arm 4/5, decreased fine motor, decreased grip 3/5, Left 4/5. Mild facial droop, aphasia or dysarthria.  Recommend loop recorder. ASA and plavix for 3 weeks then ASA alone.  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  LDL cholesterol elevated 153 mg percent and hemoglobin A1c is 5.8.   Vitals:   09/26/21 0008 09/26/21 0008 09/26/21 0009 09/26/21 0354  BP: (!) 157/88 (!) 161/85 (!) 157/88 (!) 154/96  Pulse: 70 70 72 80  Resp: 20 20 20 18   Temp: 98 F (36.7 C) 98 F (36.7 C) 98 F (36.7 C) 98.2 F (36.8 C)  TempSrc: Oral   Oral  SpO2: 98% 98% 98% 100%  Weight:      Height:       CBC:  Recent Labs  Lab 09/25/21 1009 09/25/21 1033  WBC 6.1  --   NEUTROABS 3.6  --   HGB 14.4 15.3*  HCT 45.7 45.0  MCV 93.6  --   PLT 312  --    Basic Metabolic Panel:  Recent Labs  Lab 09/25/21 1009 09/25/21 1033  NA 141 140  K 4.3 4.3  CL 105 104  CO2 28  --   GLUCOSE 98 100*  BUN 8 11  CREATININE 0.67 0.60  CALCIUM 9.3  --    Lipid Panel:  Recent Labs  Lab 09/26/21 0254  CHOL 219*  TRIG 67  HDL 73  CHOLHDL 3.0  VLDL 13  LDLCALC 133*   HgbA1c:  Recent Labs  Lab 09/26/21 0254  HGBA1C 5.8*   Urine Drug Screen:  Recent Labs  Lab 09/25/21 1445  LABOPIA POSITIVE*  COCAINSCRNUR NONE DETECTED  LABBENZ POSITIVE*  AMPHETMU NONE DETECTED  THCU NONE DETECTED  LABBARB NONE DETECTED    Alcohol Level  Recent Labs  Lab 09/25/21 1009  ETH <10    IMAGING past 24 hours MR BRAIN WO CONTRAST  Result Date: 09/25/2021 CLINICAL DATA:  Initial  evaluation for neuro deficit, stroke suspected. EXAM: MRI HEAD WITHOUT CONTRAST TECHNIQUE: Multiplanar, multiecho pulse sequences of the brain and surrounding structures were obtained without intravenous contrast. COMPARISON:  Prior CTs from earlier the same day. FINDINGS: Brain: Cerebral volume within normal limits. Mild chronic microvascular ischemic disease involving the periventricular white matter and pons noted. Few scattered small remote bilateral cerebellar infarcts. Approximate 2.5 cm curvilinear focus of restricted diffusion extending from the right insula for the right posterior basal ganglia/corona radiata, consistent with an acute ischemic infarct. Associated susceptibility artifact at the level of the right insula consistent with petechial blood products Heidelberg classification 1a: HI1, scattered small petechiae, no mass effect. No visible LV 0 on prior CTA to suggest insula luminal thrombus. No significant mass effect. No other evidence for acute or subacute ischemia. Gray-white matter differentiation otherwise maintained. No mass lesion or midline shift. No hydrocephalus or extra-axial fluid collection. Pituitary gland and suprasellar region within normal limits. Vascular: Major intracranial vascular flow voids are maintained. Skull and upper cervical spine: Craniocervical junction within normal limits. Bone marrow signal intensity somewhat heterogeneous without focal  marrow replacing lesion. No scalp soft tissue abnormality. Sinuses/Orbits: Patient status post bilateral ocular lens replacement. Scattered mucosal thickening noted about the sphenoethmoidal sinuses. Paranasal sinuses are otherwise clear. No mastoid effusion. Other: None. IMPRESSION: 1. Approximate 2.5 cm acute ischemic infarct extending from the right insula to the right posterior basal ganglia/corona radiata. Associated petechial blood products without hemorrhagic transformation or significant mass effect. 2. No other acute  intracranial abnormality. 3. Underlying mild chronic microvascular ischemic disease with a few scattered remote bilateral cerebellar infarcts. Electronically Signed   By: Jeannine Boga M.D.   On: 09/25/2021 19:05   CT ANGIO HEAD NECK W WO CM  Result Date: 09/25/2021 CLINICAL DATA:  Stroke/TIA, determine embolic source EXAM: CT ANGIOGRAPHY HEAD AND NECK TECHNIQUE: Multidetector CT imaging of the head and neck was performed using the standard protocol during bolus administration of intravenous contrast. Multiplanar CT image reconstructions and MIPs were obtained to evaluate the vascular anatomy. Carotid stenosis measurements (when applicable) are obtained utilizing NASCET criteria, using the distal internal carotid diameter as the denominator. RADIATION DOSE REDUCTION: This exam was performed according to the departmental dose-optimization program which includes automated exposure control, adjustment of the mA and/or kV according to patient size and/or use of iterative reconstruction technique. CONTRAST:  40m OMNIPAQUE IOHEXOL 350 MG/ML SOLN COMPARISON:  2019 FINDINGS: CTA NECK Aortic arch: Minimal calcified plaque. Great vessel origins are patent. Right carotid system: Patent.  No stenosis. Left carotid system: Patent.  No stenosis. Vertebral arteries: Patent. Right vertebral is dominant. 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 No stenosis. Skeleton: Cervical spine degenerative changes. Other neck: Unremarkable. Upper chest: Emphysema. Review of the MIP images confirms the above findings CTA HEAD Anterior circulation: Intracranial internal carotid arteries are patent. Streak artifact associated with aneurysm coiling extending inferiorly from the supraclinoid right ICA. Stent markers are present. No evidence of recurrent aneurysm. Anterior and middle cerebral arteries are patent. Posterior circulation: Intracranial vertebral arteries are patent. Basilar artery is patent. Posterior  cerebral arteries are patent. Bilateral posterior communicating arteries are present. Mild irregularity of the right P2 PCA. Venous sinuses: Patent as allowed by contrast bolus timing. Review of the MIP images confirms the above findings IMPRESSION: No large vessel occlusion, hemodynamically significant stenosis, or evidence of dissection. Stable appearance of coiled aneurysm with evaluation limited by artifact. Electronically Signed   By: PMacy MisM.D.   On: 09/25/2021 13:03   CT HEAD WO CONTRAST  Result Date: 09/25/2021 CLINICAL DATA:  Neuro deficit, acute, stroke suspected EXAM: CT HEAD WITHOUT CONTRAST TECHNIQUE: Contiguous axial images were obtained from the base of the skull through the vertex without intravenous contrast. RADIATION DOSE REDUCTION: This exam was performed according to the departmental dose-optimization program which includes automated exposure control, adjustment of the mA and/or kV according to patient size and/or use of iterative reconstruction technique. COMPARISON:  CT head 11/06/2015. FINDINGS: Brain: No evidence of acute large vascular territory infarction, hemorrhage, hydrocephalus, extra-axial collection or mass lesion/mass effect. Small remote right cerebellar lacunar infarct. Vascular: Embolization coils in the region of the right paraclinoid ICA aneurysm the surrounding streak artifact. Skull: No acute fracture. Sinuses/Orbits: Clear sinuses.  No acute orbital findings. Other: No mastoid effusions. IMPRESSION: No evidence of acute intracranial abnormality. Electronically Signed   By: FMargaretha SheffieldM.D.   On: 09/25/2021 10:48    PHYSICAL EXAM Patient able to tell a coherent story, oriented x 4. PERRL, tracks, crosses midline, Visual fields full. Left arm 4/5, decreased fine motor, orbits right  over left upper extremity.  Decreased grip 3/5, Left 4/5. Mild facial droop, aphasia or dysarthria, tongue midline. No ataxia.  Subtle left hip flexor ankle dorsiflexor  weakness.  Sensation intact  ASSESSMENT/PLAN Ms. KAELEY VINJE is a 76 y.o. female with history of anxiety, depression, TIA, cerebral aneurysm status post coiling, hypertension, gastritis, dysphagia, esophageal web, GERD, partial colectomy, IBS presenting with left-sided weakness.  Stroke:  Acute right insula to right posterior basal ganglia/corona radiata ischemic infarct with petechial blood likely due to embolus and chronic small vessel disease  Etiology:  likely embolic  CT head No acute abnormality.  CTA head & neck No large vessel occlusion, hemodynamically significant stenosis, or evidence of dissection. Stable appearance of coiled aneurysm with evaluation limited by artifact. MRI   1. Approximate 2.5 cm acute ischemic infarct extending from the right insula to the right posterior basal ganglia/corona radiata. Associated petechial blood products without hemorrhagic transformation or significant mass effect. 2. No other acute intracranial abnormality. 3. Underlying mild chronic microvascular ischemic disease with a few scattered remote bilateral cerebellar infarcts 2D Echo pending Recommend loop recorder at discharge or as outpatient LDL 133 HgbA1c 5.8 VTE prophylaxis - SCD's    Diet   Diet regular Room service appropriate? Yes; Fluid consistency: Thin   No antithrombotic prior to admission, now on aspirin 81 mg daily and clopidogrel 75 mg daily. For 3 weeks then ASA alone Therapy recommendations:  pending Disposition:  pending  Hypertension Home meds:  lisinopril Stable Permissive hypertension (OK if < 220/120) but gradually normalize in 5-7 days Long-term BP goal normotensive  Hyperlipidemia Home meds:  none LDL 133, goal < 70 Add atorvastatin 71m   Continue statin at discharge  Diabetes type II Controlled Home meds:  none HgbA1c 5.8, goal < 7.0 CBGs Recent Labs    09/25/21 1007  GLUCAP 93    SSI  Other Stroke Risk Factors Advanced Age >/= 69 Former  Cigarette smoker Hx stroke/TIA Hx cerebral aneurysm s/p coiled   Other Active Problems GERD Anxiety/Depression IBS  Hospital day # 0  DBeulah GandyDNP, ACNPC-AG  STROKE MD NOTE :  I have personally obtained history,examined this patient, reviewed notes, independently viewed imaging studies, participated in medical decision making and plan of care.ROS completed by me personally and pertinent positives fully documented  I have made any additions or clarifications directly to the above note. Agree with note above.  Patient presented with sudden onset of left-sided weakness secondary to large right subcortical infarct likely cryptogenic etiology.  Recommend continue ongoing cardiac monitoring check echocardiogram results.  Aspirin and Plavix for 3 weeks followed by aspirin alone.  Patient is likely going to need loop recorder at discharge or as an outpatient to look for paroxysmal A-fib.  And statin for elevated lipids.  Mobilize out of bed.  Therapy consults.  Long discussion with patient and son and Dr. PFlorene Glenand answered questions.  Greater than 50% time during this 50-minute visit were spent in counseling and coordination of care about cryptogenic stroke and discussion about evaluation and treatment and answering questions.  PAntony Contras MD Medical Director [facility] Pager: [phone]/15/2023 2:42 PM  To contact Stroke Continuity provider, please refer to [email or web address] After hours, contact General Neurology

## 2021-09-27 DIAGNOSIS — F419 Anxiety disorder, unspecified: Secondary | ICD-10-CM | POA: Diagnosis not present

## 2021-09-27 DIAGNOSIS — R7303 Prediabetes: Secondary | ICD-10-CM

## 2021-09-27 DIAGNOSIS — I5189 Other ill-defined heart diseases: Secondary | ICD-10-CM

## 2021-09-27 MED ORDER — LISINOPRIL 10 MG PO TABS
10.0000 mg | ORAL_TABLET | Freq: Every day | ORAL | Status: DC
  Administered 2021-09-27: 10 mg via ORAL
  Filled 2021-09-27: qty 1

## 2021-09-27 MED ORDER — ASPIRIN 81 MG PO TBEC: 81.0000 mg | DELAYED_RELEASE_TABLET | Freq: Every day | ORAL | 12 refills | Status: DC

## 2021-09-27 MED ORDER — CLOPIDOGREL BISULFATE 75 MG PO TABS: 75.0000 mg | ORAL_TABLET | Freq: Every day | ORAL | 0 refills | Status: AC

## 2021-09-27 MED ORDER — ATORVASTATIN CALCIUM 80 MG PO TABS: 80.0000 mg | ORAL_TABLET | Freq: Every day | ORAL | 11 refills | Status: DC

## 2021-09-27 NOTE — Assessment & Plan Note (Signed)
PCP follow up outpatient

## 2021-09-27 NOTE — Assessment & Plan Note (Signed)
Follow outpatient

## 2021-09-27 NOTE — Discharge Summary (Signed)
Physician Discharge Summary  Cassandra Alexander RJJ:[id number] DOB: 29-Jul-1945 DOA: 09/25/2021  PCP: Alroy Dust, Cassandra Sa, MD  Admit date: 09/25/2021 Discharge date: 09/27/2021  Time spent: 40 minutes  Recommendations for Outpatient Follow-up:  Follow outpatient CBC/CMP  Follow with neurology outpatient Follow BP outpatient, adjust BP regimen as needed  Follow a1c/prediabetes outpatient  Follow loop recorder outpatient   Discharge Diagnoses:  Principal Problem:   Acute CVA (cerebrovascular accident) [facility]) Active Problems:   S/P cerebral aneurysm operation   Benign essential hypertension   Chronic gastritis   Anxiety and depression   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HCC)   Low back pain   Prediabetes   Diastolic dysfunction   Dysphagia   Gastroesophageal reflux disease   Esophageal web   History of partial colectomy   Irritable bowel syndrome   Primary generalized (osteo)arthritis   Discharge Condition: stable  Diet recommendation: heart healthy  Filed Weights   09/25/21 0947  Weight: 52.2 kg    History of present illness:  Cassandra Alexander is Cassandra Alexander 76 y.o. female with medical history significant of anxiety, depression, TIA, cerebral aneurysm status post coiling, hypertension, gastritis, dysphagia, esophageal web, GERD, partial colectomy, IBS presenting with left-sided weakness.  Imaging confirmed Cassandra Alexander stroke.  She was seen by neurology who recommended DAPTx3 weeks, then aspirin alone.  Lipitor.  She'll need Cassandra Alexander loop recorder outpatient.  Discharged with home health.  See below for additional details.  Hospital Course:  Assessment and Plan: * Acute CVA (cerebrovascular accident) (Cassandra Alexander) MRI with 2.5 cm acute ischemic infarct extending from thr right insula to the right posterior basal ganglia/corona radiata, associated petechial blood products without hemorrhagic transformation or significant mass effect CTA without LVO, hemodynamically significant stenosis or evidence of dissection Echo  with EF 55-60%, mild asymmetric LV hypertrophy of the basal septal segment, grade 1 diastolic dysfunction LDL 016, a1c 5.8 Neurology c/s, appreciate recs -> DAPT x3 weeks, then aspirin alone.  Will need loop recorder (message sent to card master).  Lipitor.    S/P cerebral aneurysm operation S/p coiling   Benign essential hypertension Lisinopril currently on hold for permissive hypertension  Chronic gastritis PPI  Low back pain norco prn  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HCC) mesalamine  Anxiety and depression Imipramine, celexa, xanax  Diastolic dysfunction PCP follow up outpatient  Prediabetes Follow outpatient       Procedures: Echo IMPRESSIONS     1. Left ventricular ejection fraction, by estimation, is 55 to 60%. The  left ventricle has normal function. The left ventricle has no regional  wall motion abnormalities. There is mild asymmetric left ventricular  hypertrophy of the basal-septal segment.  Left ventricular diastolic parameters are consistent with Grade I  diastolic dysfunction (impaired relaxation).   2. Right ventricular systolic function is normal. The right ventricular  size is normal. There is normal pulmonary artery systolic pressure.   3. The mitral valve is grossly normal. Trivial mitral valve  regurgitation.   4. The aortic valve is tricuspid. There is mild calcification of the  aortic valve. There is mild thickening of the aortic valve. Aortic valve  regurgitation is trivial. Aortic valve sclerosis/calcification is present,  without any evidence of aortic  stenosis.   5. The inferior vena cava is normal in size with greater than 50%  respiratory variability, suggesting right atrial pressure of 3 mmHg.   Comparison(s): No prior Echocardiogram.   Conclusion(s)/Recommendation(s): No intracardiac source of embolism  detected on this transthoracic study. Consider Cassandra Alexander transesophageal  echocardiogram to exclude cardiac source of  embolism if  clinically  indicated.    Consultations: neurology  Discharge Exam: Vitals:   09/27/21 0414 09/27/21 0800  BP: (!) 159/89 (!) 142/91  Pulse: 75 86  Resp: 16   Temp: 98.4 F (36.9 C) 98.6 F (37 C)  SpO2: 97% 96%   Eager to discharge home, asks me multiple times, says she's ready Thinks she's improved  General: No acute distress. Sitting up with breakfast Cardiovascular: RRR Lungs: unlabored Neurological: Alert and oriented 3. Moves all extremities 4.  Mild L sided weakness remains present (4+/5).  L finger nose finger slowed, but better than yesterday. Skin: Warm and dry. No rashes or lesions. Extremities: No clubbing or cyanosis. No edema. Discharge Instructions   Discharge Instructions     Ambulatory referral to Neurology   Complete by: As directed    An appointment is requested in approximately: 2 weeks   Call MD for:  difficulty breathing, headache or visual disturbances   Complete by: As directed    Call MD for:  extreme fatigue   Complete by: As directed    Call MD for:  hives   Complete by: As directed    Call MD for:  persistant dizziness or light-headedness   Complete by: As directed    Call MD for:  persistant nausea and vomiting   Complete by: As directed    Call MD for:  redness, tenderness, or signs of infection (pain, swelling, redness, odor or green/yellow discharge around incision site)   Complete by: As directed    Call MD for:  severe uncontrolled pain   Complete by: As directed    Call MD for:  temperature >100.4   Complete by: As directed    Diet - low sodium heart healthy   Complete by: As directed    Discharge instructions   Complete by: As directed    You were seen for Cassandra Alexander stroke.  We'll discharge you home with aspirin and plavix together for 21 days (you had 3 days of plavix here, so 18 days left), then you'll take aspirin alone.  You'll take lipitor 80 mg daily.    You should follow up with neurology as an outpatient.  I've sent Cassandra Alexander  message to the cardiologist about scheduling Cassandra Alexander loop recorder for you.  You should receive Cassandra Alexander phone call in the next week or so about this (if you don't get Ailea Rhatigan phone call, ask neurology or your PCP about Kyng Matlock referral for Arjen Deringer loop recorder).  You have borderline prediabetes.  Follow up with your PCP regarding this.  Maintaining Shervin Cypert healthy weight, diet, and exercise are important to prevent the development of diabetes.  Follow your blood pressure with your PCP.  You may need adjustment in the future of your medications.  Return for new, recurrent, or worsening symptoms.  Please ask your PCP to request records from this hospitalization so they know what was done and what the next steps will be.   Increase activity slowly   Complete by: As directed       Allergies as of 09/27/2021       Reactions   Cortisone Swelling   Caused facial swelling and headache after receiving cortisone injection   Penicillins Rash   Childhood reaction Has patient had Petina Muraski PCN reaction causing immediate rash, facial/tongue/throat swelling, SOB or lightheadedness with hypotension: Yes Has patient had Chereese Cilento PCN reaction causing severe rash involving mucus membranes or skin necrosis: No Has patient had Dannae Kato PCN reaction that required hospitalization No Has  patient had Taniesha Glanz PCN reaction occurring within the last 10 years: No If all of the above answers are "NO", then may proceed with Cephalosporin use.        Medication List     TAKE these medications    ALPRAZolam 1 MG tablet Commonly known as: XANAX TAKE 1 TABLET BY MOUTH THREE TIMES DAILY AS NEEDED What changed:  how much to take when to take this   aspirin EC 81 MG tablet Take 1 tablet (81 mg total) by mouth daily. Swallow whole. Start taking on: September 28, 2021   atorvastatin 80 MG tablet Commonly known as: LIPITOR Take 1 tablet (80 mg total) by mouth daily. Start taking on: September 28, 2021   citalopram 10 MG tablet Commonly known as: CELEXA Take 10 mg by mouth at  bedtime.   clopidogrel 75 MG tablet Commonly known as: PLAVIX Take 1 tablet (75 mg total) by mouth daily for 18 days. Start taking on: September 28, 2021   HYDROcodone-acetaminophen 10-325 MG tablet Commonly known as: NORCO Take 1 tablet by mouth every 4 (four) hours as needed for pain.   imipramine 50 MG tablet Commonly known as: TOFRANIL TAKE 3 TABLETS AT BEDTIME What changed: how much to take   lisinopril 10 MG tablet Commonly known as: ZESTRIL Take 10 mg by mouth daily.   mesalamine 1.2 g EC tablet Commonly known as: LIALDA Take 2.4 g by mouth daily with breakfast.   pantoprazole 40 MG tablet Commonly known as: PROTONIX Take 40 mg by mouth every morning.               Durable Medical Equipment  (From admission, onward)           Start     Ordered   09/26/21 1514  For home use only DME 3 n 1  Once        09/26/21 1513           Allergies  Allergen Reactions   Cortisone Swelling    Caused facial swelling and headache after receiving cortisone injection   Penicillins Rash    Childhood reaction Has patient had Breonia Kirstein PCN reaction causing immediate rash, facial/tongue/throat swelling, SOB or lightheadedness with hypotension: Yes Has patient had Marilyn Nihiser PCN reaction causing severe rash involving mucus membranes or skin necrosis: No Has patient had Natash Berman PCN reaction that required hospitalization No Has patient had Yatzary Merriweather PCN reaction occurring within the last 10 years: No If all of the above answers are "NO", then may proceed with Cephalosporin use.     Follow-up Information     Schedule an appointment as soon as possible for Boden Stucky visit  with Alroy Dust, Cassandra Sa, MD.   Specialty: Family Medicine Why: For recheck of your symptoms Contact information: [address] [place] [address] [phone]                  The results of significant diagnostics from this hospitalization (including imaging, microbiology, ancillary and laboratory) are listed  below for reference.    Significant Diagnostic Studies: ECHOCARDIOGRAM COMPLETE  Result Date: 09/26/2021    ECHOCARDIOGRAM REPORT   Patient Name:   Cassandra Alexander Date of Exam: 09/26/2021 Medical Rec #:  [id number]     Height:       60.0 in Accession #:    [id number]    Weight:       115.0 lb Date of Birth:  1945/12/27     BSA:  1.475 m Patient Age:    34 years      BP:           154/96 mmHg Patient Gender: F             HR:           70 bpm. Exam Location:  Inpatient Procedure: 2D Echo Indications:     stroke  History:         Patient has no prior history of Echocardiogram examinations.                  Risk Factors:Hypertension.  Sonographer:     Johny Chess RDCS Referring Phys:  [id number] [place] Diagnosing Phys: Gwyndolyn Kaufman MD IMPRESSIONS  1. Left ventricular ejection fraction, by estimation, is 55 to 60%. The left ventricle has normal function. The left ventricle has no regional wall motion abnormalities. There is mild asymmetric left ventricular hypertrophy of the basal-septal segment. Left ventricular diastolic parameters are consistent with Grade I diastolic dysfunction (impaired relaxation).  2. Right ventricular systolic function is normal. The right ventricular size is normal. There is normal pulmonary artery systolic pressure.  3. The mitral valve is grossly normal. Trivial mitral valve regurgitation.  4. The aortic valve is tricuspid. There is mild calcification of the aortic valve. There is mild thickening of the aortic valve. Aortic valve regurgitation is trivial. Aortic valve sclerosis/calcification is present, without any evidence of aortic stenosis.  5. The inferior vena cava is normal in size with greater than 50% respiratory variability, suggesting right atrial pressure of 3 mmHg. Comparison(s): No prior Echocardiogram. Conclusion(s)/Recommendation(s): No intracardiac source of embolism detected on this transthoracic study. Consider Nainika Newlun transesophageal echocardiogram  to exclude cardiac source of embolism if clinically indicated. FINDINGS  Left Ventricle: Left ventricular ejection fraction, by estimation, is 55 to 60%. The left ventricle has normal function. The left ventricle has no regional wall motion abnormalities. The left ventricular internal cavity size was normal in size. There is  mild asymmetric left ventricular hypertrophy of the basal-septal segment. Left ventricular diastolic parameters are consistent with Grade I diastolic dysfunction (impaired relaxation). Right Ventricle: The right ventricular size is normal. No increase in right ventricular wall thickness. Right ventricular systolic function is normal. There is normal pulmonary artery systolic pressure. The tricuspid regurgitant velocity is 2.62 m/s, and  with an assumed right atrial pressure of 3 mmHg, the estimated right ventricular systolic pressure is 63.7 mmHg. Left Atrium: Left atrial size was normal in size. Right Atrium: Right atrial size was normal in size. Pericardium: There is no evidence of pericardial effusion. Mitral Valve: The mitral valve is grossly normal. There is mild thickening of the mitral valve leaflet(s). There is mild calcification of the mitral valve leaflet(s). Mild mitral annular calcification. Trivial mitral valve regurgitation. Tricuspid Valve: The tricuspid valve is normal in structure. Tricuspid valve regurgitation is mild. Aortic Valve: The aortic valve is tricuspid. There is mild calcification of the aortic valve. There is mild thickening of the aortic valve. Aortic valve regurgitation is trivial. Aortic valve sclerosis/calcification is present, without any evidence of aortic stenosis. Pulmonic Valve: The pulmonic valve was normal in structure. Pulmonic valve regurgitation is trivial. Aorta: The aortic root and ascending aorta are structurally normal, with no evidence of dilitation. Venous: The inferior vena cava is normal in size with greater than 50% respiratory variability,  suggesting right atrial pressure of 3 mmHg. IAS/Shunts: The atrial septum is grossly normal.  LEFT VENTRICLE PLAX 2D LVIDd:  4.40 cm   Diastology LVIDs:         2.50 cm   LV e' medial:    4.90 cm/s LV PW:         0.80 cm   LV E/e' medial:  12.9 LV IVS:        1.00 cm   LV e' lateral:   7.72 cm/s LVOT diam:     2.10 cm   LV E/e' lateral: 8.2 LV SV:         51 LV SV Index:   34 LVOT Area:     3.46 cm  RIGHT VENTRICLE             IVC RV S prime:     17.30 cm/s  IVC diam: 0.80 cm TAPSE (M-mode): 1.9 cm LEFT ATRIUM             Index        RIGHT ATRIUM          Index LA diam:        3.00 cm 2.03 cm/m   RA Area:     8.13 cm LA Vol (A2C):   20.9 ml 14.17 ml/m  RA Volume:   15.80 ml 10.71 ml/m LA Vol (A4C):   16.5 ml 11.18 ml/m LA Biplane Vol: 18.7 ml 12.67 ml/m  AORTIC VALVE LVOT Vmax:   75.50 cm/s LVOT Vmean:  46.800 cm/s LVOT VTI:    0.146 m  AORTA Ao Root diam: 3.10 cm Ao Asc diam:  3.10 cm MITRAL VALVE               TRICUSPID VALVE MV Area (PHT): 3.72 cm    TR Peak grad:   27.5 mmHg MV Decel Time: 204 msec    TR Vmax:        262.00 cm/s MV E velocity: 63.20 cm/s MV Alvester Eads velocity: 99.30 cm/s  SHUNTS MV E/Yvana Samonte ratio:  0.64        Systemic VTI:  0.15 m                            Systemic Diam: 2.10 cm Gwyndolyn Kaufman MD Electronically signed by Gwyndolyn Kaufman MD Signature Date/Time: 09/26/2021/1:11:35 PM    Final (Updated)    MR BRAIN WO CONTRAST  Result Date: 09/25/2021 CLINICAL DATA:  Initial evaluation for neuro deficit, stroke suspected. EXAM: MRI HEAD WITHOUT CONTRAST TECHNIQUE: Multiplanar, multiecho pulse sequences of the brain and surrounding structures were obtained without intravenous contrast. COMPARISON:  Prior CTs from earlier the same day. FINDINGS: Brain: Cerebral volume within normal limits. Mild chronic microvascular ischemic disease involving the periventricular white matter and pons noted. Few scattered small remote bilateral cerebellar infarcts. Approximate 2.5 cm curvilinear focus of  restricted diffusion extending from the right insula for the right posterior basal ganglia/corona radiata, consistent with an acute ischemic infarct. Associated susceptibility artifact at the level of the right insula consistent with petechial blood products Heidelberg classification 1a: HI1, scattered small petechiae, no mass effect. No visible LV 0 on prior CTA to suggest insula luminal thrombus. No significant mass effect. No other evidence for acute or subacute ischemia. Gray-white matter differentiation otherwise maintained. No mass lesion or midline shift. No hydrocephalus or extra-axial fluid collection. Pituitary gland and suprasellar region within normal limits. Vascular: Major intracranial vascular flow voids are maintained. Skull and upper cervical spine: Craniocervical junction within normal limits. Bone marrow signal intensity somewhat heterogeneous without focal marrow replacing  lesion. No scalp soft tissue abnormality. Sinuses/Orbits: Patient status post bilateral ocular lens replacement. Scattered mucosal thickening noted about the sphenoethmoidal sinuses. Paranasal sinuses are otherwise clear. No mastoid effusion. Other: None. IMPRESSION: 1. Approximate 2.5 cm acute ischemic infarct extending from the right insula to the right posterior basal ganglia/corona radiata. Associated petechial blood products without hemorrhagic transformation or significant mass effect. 2. No other acute intracranial abnormality. 3. Underlying mild chronic microvascular ischemic disease with Aamira Bischoff few scattered remote bilateral cerebellar infarcts. Electronically Signed   By: Jeannine Boga M.D.   On: 09/25/2021 19:05   CT ANGIO HEAD NECK W WO CM  Result Date: 09/25/2021 CLINICAL DATA:  Stroke/TIA, determine embolic source EXAM: CT ANGIOGRAPHY HEAD AND NECK TECHNIQUE: Multidetector CT imaging of the head and neck was performed using the standard protocol during bolus administration of intravenous contrast.  Multiplanar CT image reconstructions and MIPs were obtained to evaluate the vascular anatomy. Carotid stenosis measurements (when applicable) are obtained utilizing NASCET criteria, using the distal internal carotid diameter as the denominator. RADIATION DOSE REDUCTION: This exam was performed according to the departmental dose-optimization program which includes automated exposure control, adjustment of the mA and/or kV according to patient size and/or use of iterative reconstruction technique. CONTRAST:  41m OMNIPAQUE IOHEXOL 350 MG/ML SOLN COMPARISON:  2019 FINDINGS: CTA NECK Aortic arch: Minimal calcified plaque. Great vessel origins are patent. Right carotid system: Patent.  No stenosis. Left carotid system: Patent.  No stenosis. Vertebral arteries: Patent. Right vertebral is dominant. 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 No stenosis. Skeleton: Cervical spine degenerative changes. Other neck: Unremarkable. Upper chest: Emphysema. Review of the MIP images confirms the above findings CTA HEAD Anterior circulation: Intracranial internal carotid arteries are patent. Streak artifact associated with aneurysm coiling extending inferiorly from the supraclinoid right ICA. Stent markers are present. No evidence of recurrent aneurysm. Anterior and middle cerebral arteries are patent. Posterior circulation: Intracranial vertebral arteries are patent. Basilar artery is patent. Posterior cerebral arteries are patent. Bilateral posterior communicating arteries are present. Mild irregularity of the right P2 PCA. Venous sinuses: Patent as allowed by contrast bolus timing. Review of the MIP images confirms the above findings IMPRESSION: No large vessel occlusion, hemodynamically significant stenosis, or evidence of dissection. Stable appearance of coiled aneurysm with evaluation limited by artifact. Electronically Signed   By: PMacy MisM.D.   On: 09/25/2021 13:03   CT HEAD WO  CONTRAST  Result Date: 09/25/2021 CLINICAL DATA:  Neuro deficit, acute, stroke suspected EXAM: CT HEAD WITHOUT CONTRAST TECHNIQUE: Contiguous axial images were obtained from the base of the skull through the vertex without intravenous contrast. RADIATION DOSE REDUCTION: This exam was performed according to the departmental dose-optimization program which includes automated exposure control, adjustment of the mA and/or kV according to patient size and/or use of iterative reconstruction technique. COMPARISON:  CT head 11/06/2015. FINDINGS: Brain: No evidence of acute large vascular territory infarction, hemorrhage, hydrocephalus, extra-axial collection or mass lesion/mass effect. Small remote right cerebellar lacunar infarct. Vascular: Embolization coils in the region of the right paraclinoid ICA aneurysm the surrounding streak artifact. Skull: No acute fracture. Sinuses/Orbits: Clear sinuses.  No acute orbital findings. Other: No mastoid effusions. IMPRESSION: No evidence of acute intracranial abnormality. Electronically Signed   By: FMargaretha SheffieldM.D.   On: 09/25/2021 10:48    Microbiology: Recent Results (from the past 240 hour(s))  Resp Panel by RT-PCR (Flu Hamlin Devine&B, Covid) Anterior Nasal Swab     Status: None   Collection Time:  09/25/21  9:56 AM   Specimen: Anterior Nasal Swab  Result Value Ref Range Status   SARS Coronavirus 2 by RT PCR NEGATIVE NEGATIVE Final    Comment: (NOTE) SARS-CoV-2 target nucleic acids are NOT DETECTED.  The SARS-CoV-2 RNA is generally detectable in upper respiratory specimens during the acute phase of infection. The lowest concentration of SARS-CoV-2 viral copies this assay can detect is 138 copies/mL. Kingdavid Leinbach negative result does not preclude SARS-Cov-2 infection and should not be used as the sole basis for treatment or other patient management decisions. Kiwanna Spraker negative result may occur with  improper specimen collection/handling, submission of specimen other than  nasopharyngeal swab, presence of viral mutation(s) within the areas targeted by this assay, and inadequate number of viral copies(<138 copies/mL). Donnis Phaneuf negative result must be combined with clinical observations, patient history, and epidemiological information. The expected result is Negative.  Fact Sheet for Patients:  EntrepreneurPulse.com.au  Fact Sheet for Healthcare Providers:  IncredibleEmployment.be  This test is no t yet approved or cleared by the [place] FDA and  has been authorized for detection and/or diagnosis of SARS-CoV-2 by FDA under an Emergency Use Authorization (EUA). This EUA will remain  in effect (meaning this test can be used) for the duration of the COVID-19 declaration under Section 564(b)(1) of the Act, 21 U.S.C.section 360bbb-3(b)(1), unless the authorization is terminated  or revoked sooner.       Influenza Kavin Weckwerth by PCR NEGATIVE NEGATIVE Final   Influenza B by PCR NEGATIVE NEGATIVE Final    Comment: (NOTE) The Xpert Xpress SARS-CoV-2/FLU/RSV plus assay is intended as an aid in the diagnosis of influenza from Nasopharyngeal swab specimens and should not be used as Libbie Bartley sole basis for treatment. Nasal washings and aspirates are unacceptable for Xpert Xpress SARS-CoV-2/FLU/RSV testing.  Fact Sheet for Patients: EntrepreneurPulse.com.au  Fact Sheet for Healthcare Providers: IncredibleEmployment.be  This test is not yet approved or cleared by the [place] FDA and has been authorized for detection and/or diagnosis of SARS-CoV-2 by FDA under an Emergency Use Authorization (EUA). This EUA will remain in effect (meaning this test can be used) for the duration of the COVID-19 declaration under Section 564(b)(1) of the Act, 21 U.S.C. section 360bbb-3(b)(1), unless the authorization is terminated or revoked.  Performed at [facility], [place] [address], [place], [place] [address]       Labs: Basic Metabolic Panel: Recent Labs  Lab 09/25/21 1009 09/25/21 1033  NA 141 140  K 4.3 4.3  CL 105 104  CO2 28  --   GLUCOSE 98 100*  BUN 8 11  CREATININE 0.67 0.60  CALCIUM 9.3  --    Liver Function Tests: Recent Labs  Lab 09/25/21 1009  AST 19  ALT 12  ALKPHOS 55  BILITOT 0.7  PROT 7.0  ALBUMIN 4.1   No results for input(s): "LIPASE", "AMYLASE" in the last 168 hours. No results for input(s): "AMMONIA" in the last 168 hours. CBC: Recent Labs  Lab 09/25/21 1009 09/25/21 1033  WBC 6.1  --   NEUTROABS 3.6  --   HGB 14.4 15.3*  HCT 45.7 45.0  MCV 93.6  --   PLT 312  --    Cardiac Enzymes: No results for input(s): "CKTOTAL", "CKMB", "CKMBINDEX", "TROPONINI" in the last 168 hours. BNP: BNP (last 3 results) No results for input(s): "BNP" in the last 8760 hours.  ProBNP (last 3 results) No results for input(s): "PROBNP" in the last 8760 hours.  CBG: Recent Labs  Lab  09/25/21 1007  GLUCAP 93       Signed:  Fayrene Helper MD.  [facility] 09/27/2021, 9:25 AM

## 2021-09-27 NOTE — Progress Notes (Signed)
Discharge orders place. AVS printed and reviewed with patient and husband at bedside. All questions/concerns addressed. Peripheral IV removed. Home health set up, to begin on Monday 7/17.  Discharged home vis private auto

## 2021-10-01 DIAGNOSIS — I639 Cerebral infarction, unspecified: Secondary | ICD-10-CM | POA: Diagnosis not present

## 2021-10-01 DIAGNOSIS — R2681 Unsteadiness on feet: Secondary | ICD-10-CM | POA: Diagnosis not present

## 2021-10-01 DIAGNOSIS — M6281 Muscle weakness (generalized): Secondary | ICD-10-CM | POA: Diagnosis not present

## 2021-10-04 NOTE — Progress Notes (Unsigned)
Cardiology Office Note Date:  10/06/2021  Patient ID:  Cassandra Alexander [birth year], MRN [id number] PCP:  Aurea Graff.Marlou Sa, MD  Electrophysiologist: new    Chief Complaint:  eval for loop  History of Present Illness: Cassandra Alexander is a 76 y.o. female with history of HTN, Anxiety/depression,  TIA, hx of cerebral aneurysm status post coiling, dysphagia, esophageal web, GERD, partial colectomy, IBS.  She was admitted  09/25/21 with L sided weakness, neurology noted Acute right insula to right posterior basal ganglia/corona radiata ischemic infarct with petechial blood likely due to embolus and chronic small vessel disease  Etiology:  likely embolic  Discharged 4/40/34 with neurology recommends for consideration of loop recorder.  She is here for discussion on possible loop implant.  TODAY She feels well and thinks she has and is making a remarkable recovery, getting home PT No CP, palpitations or cardiac awareness ever No hx of near syncope or syncope. No cardiac history    Past Medical History:  Diagnosis Date   Anxiety    Cerebral aneurysm 2006   s/p  coiling right side  ("find during an MRI for headaches")   PCOM   Depression    Diverticula of colon    Headache 07/07/2015   Hyperlipidemia    IBS (irritable bowel syndrome)    Left sided numbness 07/07/2015   Osteoarthritis    knees, back, hands, hips   Primary localized osteoarthritis of left knee 05/16/2018   Primary localized osteoarthritis of left knee 05/16/2018   Pulmonary nodule 1 cm or greater in diameter 05/20/2021   S/P left unicompartmental knee replacement 05/16/2018   TIA (transient ischemic attack) 07/07/2015   Wears glasses     Past Surgical History:  Procedure Laterality Date   ANEURYSM COILING  2006 @[facility]    right PCOM   CESAREAN SECTION  x2  last one  1970s   COLONOSCOPY     LUMBAR FUSION  2017   --  dr Trenton Gammon per pt   PARTIAL KNEE ARTHROPLASTY Left 05/16/2018   Procedure: UNICOMPARTMENTAL KNEE;  Surgeon:  Marchia Bond, MD;  Location: [facility];  Service: Orthopedics;  Laterality: Left;   RECTAL PROLAPSE REPAIR  2004 approx.  @[facility]    TONSILLECTOMY AND ADENOIDECTOMY  age 40   TOTAL KNEE ARTHROPLASTY Right early 2000s   VAGINAL HYSTERECTOMY  eary age 61s   ovaries remain    Current Outpatient Medications  Medication Sig Dispense Refill   ALPRAZolam (XANAX) 1 MG tablet Take 1-1/2 tablet by mouth  daily at bedtime     aspirin EC 81 MG tablet Take 1 tablet (81 mg total) by mouth daily. Swallow whole. 30 tablet 12   atorvastatin (LIPITOR) 80 MG tablet Take 1 tablet (80 mg total) by mouth daily. 30 tablet 11   citalopram (CELEXA) 10 MG tablet Take 10 mg by mouth at bedtime.     clopidogrel (PLAVIX) 75 MG tablet Take 1 tablet (75 mg total) by mouth daily for 18 days. 18 tablet 0   HYDROcodone-acetaminophen (NORCO) 10-325 MG tablet Take 3 tablets by mouth daily.     imipramine (TOFRANIL) 50 MG tablet Take 2 tablets by mouth at bedtime.     lisinopril (ZESTRIL) 10 MG tablet Take 10 mg by mouth daily.     mesalamine (LIALDA) 1.2 g EC tablet Take 2.4 g by mouth daily with breakfast.     pantoprazole (PROTONIX) 40 MG tablet Take 40 mg by mouth every morning.     No current facility-administered medications  for this visit.    Allergies:   Cortisone and Penicillins   Social History:  The patient  reports that she quit smoking about 31 years ago. Her smoking use included cigarettes. She has never used smokeless tobacco. She reports that she does not currently use alcohol. She reports that she does not use drugs.   Family History:  The patient's family history includes Hypertension in her mother; Osteoarthritis in her father.  ROS:  Please see the history of present illness.    All other systems are reviewed and otherwise negative.   PHYSICAL EXAM:  VS:  BP (!) 121/56   Pulse 76   Ht 4' 11"  (1.499 m)   Wt 114 lb (51.7 kg)   SpO2 97%   BMI 23.03 kg/m  BMI: Body mass index is 23.03 kg/m. Well  nourished, well developed, in no acute distress HEENT: normocephalic, atraumatic Neck: no JVD, carotid bruits or masses Cardiac:   RRR; no significant murmurs, no rubs, or gallops Lungs:   CTA b/l, no wheezing, rhonchi or rales Abd: soft, nontender MS: no deformity or atrophy Ext: no edema Skin: warm and dry, no rash Neuro:  formal neuro evaluation not completed, no obvious gross deficits are appreciated Psych: euthymic mood, full affect   EKG:  Done today and reviewed by myself shows  SR 69, low voltage All EKGs and available CV strips are reviewed with SR only   09/26/21: TTE 1. Left ventricular ejection fraction, by estimation, is 55 to 60%. The  left ventricle has normal function. The left ventricle has no regional  wall motion abnormalities. There is mild asymmetric left ventricular  hypertrophy of the basal-septal segment.  Left ventricular diastolic parameters are consistent with Grade I  diastolic dysfunction (impaired relaxation).   2. Right ventricular systolic function is normal. The right ventricular  size is normal. There is normal pulmonary artery systolic pressure.   3. The mitral valve is grossly normal. Trivial mitral valve  regurgitation.   4. The aortic valve is tricuspid. There is mild calcification of the  aortic valve. There is mild thickening of the aortic valve. Aortic valve  regurgitation is trivial. Aortic valve sclerosis/calcification is present,  without any evidence of aortic  stenosis.   5. The inferior vena cava is normal in size with greater than 50%  respiratory variability, suggesting right atrial pressure of 3 mmHg.   Comparison(s): No prior Echocardiogram.   Conclusion(s)/Recommendation(s): No intracardiac source of embolism  detected on this transthoracic study. Consider a transesophageal  echocardiogram to exclude cardiac source of embolism if clinically  indicated.   Recent Labs: 09/25/2021: ALT 12; BUN 11; Creatinine, Ser 0.60;  Hemoglobin 15.3; Platelets 312; Potassium 4.3; Sodium 140  09/26/2021: Cholesterol 219; HDL 73; LDL Cholesterol 133; Total CHOL/HDL Ratio 3.0; Triglycerides 67; VLDL 13   Estimated Creatinine Clearance: 42.1 mL/min (by C-G formula based on SCr of 0.6 mg/dL).   Wt Readings from Last 3 Encounters:  10/06/21 114 lb (51.7 kg)  09/25/21 115 lb (52.2 kg)  05/20/21 114 lb 3.2 oz (51.8 kg)     Other studies reviewed: Additional studies/records reviewed today include: summarized above  ASSESSMENT AND PLAN:  Cryptogenic stroke Discussed rational for heart rhythm monitoring in the environment of her stroke. Discussed wearable monitor and loop Discussed loop implant, potential risks and benefits  D/w Dr. Curt Bears, agrees needs surveillance for AFib  She is a bit overwhelmed with her recent stroke, getting PT and her recovery, she comfortable starting with the 30  day monitor and f/u with Dr. Curt Bears for possible loop implant at that time pending monitor findings.   Disposition: F/u with Dr. Curt Bears in 6 weeks or so for visit and possible loop implant  Current medicines are reviewed at length with the patient today.  The patient did not have any concerns regarding medicines.  Venetia Night, PA-C 10/06/2021 11:07 AM     [facility] [address] [place] [address] [phone] (office)  [phone] (fax)

## 2021-10-05 DIAGNOSIS — I10 Essential (primary) hypertension: Secondary | ICD-10-CM | POA: Diagnosis not present

## 2021-10-05 DIAGNOSIS — Z8673 Personal history of transient ischemic attack (TIA), and cerebral infarction without residual deficits: Secondary | ICD-10-CM | POA: Diagnosis not present

## 2021-10-06 ENCOUNTER — Other Ambulatory Visit: Payer: Self-pay | Admitting: Physician Assistant

## 2021-10-06 ENCOUNTER — Ambulatory Visit: Payer: PPO | Admitting: Physician Assistant

## 2021-10-06 ENCOUNTER — Encounter: Payer: Self-pay | Admitting: Physician Assistant

## 2021-10-06 VITALS — BP 121/56 | HR 76 | Ht 59.0 in | Wt 114.0 lb

## 2021-10-06 DIAGNOSIS — I10 Essential (primary) hypertension: Secondary | ICD-10-CM

## 2021-10-06 DIAGNOSIS — I5189 Other ill-defined heart diseases: Secondary | ICD-10-CM

## 2021-10-06 DIAGNOSIS — I4891 Unspecified atrial fibrillation: Secondary | ICD-10-CM

## 2021-10-06 DIAGNOSIS — I639 Cerebral infarction, unspecified: Secondary | ICD-10-CM

## 2021-10-06 NOTE — Patient Instructions (Addendum)
Medication Instructions:  Your physician recommends that you continue on your current medications as directed. Please refer to the Current Medication list given to you today.  Labwork: None ordered.  Testing/Procedures: 30 Day Event Monitor-    Follow-Up: Your physician wants you to follow-up in: 6 WEEKS with Dr. Elliot Cousin;  Schedule MD visit only.     Any Other Special Instructions Will Be Listed Below (If Applicable).  If you need a refill on your cardiac medications before your next appointment, please call your pharmacy.   Important Information About Sugar

## 2021-10-08 DIAGNOSIS — M6281 Muscle weakness (generalized): Secondary | ICD-10-CM | POA: Diagnosis not present

## 2021-10-08 DIAGNOSIS — R2681 Unsteadiness on feet: Secondary | ICD-10-CM | POA: Diagnosis not present

## 2021-10-08 DIAGNOSIS — I639 Cerebral infarction, unspecified: Secondary | ICD-10-CM | POA: Diagnosis not present

## 2021-10-12 ENCOUNTER — Telehealth: Payer: Self-pay | Admitting: Physician Assistant

## 2021-10-12 DIAGNOSIS — M6281 Muscle weakness (generalized): Secondary | ICD-10-CM | POA: Diagnosis not present

## 2021-10-12 DIAGNOSIS — R2681 Unsteadiness on feet: Secondary | ICD-10-CM | POA: Diagnosis not present

## 2021-10-12 DIAGNOSIS — I639 Cerebral infarction, unspecified: Secondary | ICD-10-CM | POA: Diagnosis not present

## 2021-10-12 NOTE — Telephone Encounter (Signed)
Patient states the heart monitor is very complicated and needs help putting it on.

## 2021-10-14 NOTE — Telephone Encounter (Signed)
Patient scheduled Monday, 10/19/21, 2:00 PM, at [facility], [employer] office, to have 30 day [employer] monitor applied. Monitor mailed to patient 10/06/21.

## 2021-10-15 DIAGNOSIS — M6281 Muscle weakness (generalized): Secondary | ICD-10-CM | POA: Diagnosis not present

## 2021-10-15 DIAGNOSIS — I639 Cerebral infarction, unspecified: Secondary | ICD-10-CM | POA: Diagnosis not present

## 2021-10-15 DIAGNOSIS — R2681 Unsteadiness on feet: Secondary | ICD-10-CM | POA: Diagnosis not present

## 2021-10-19 ENCOUNTER — Ambulatory Visit (INDEPENDENT_AMBULATORY_CARE_PROVIDER_SITE_OTHER): Payer: PPO

## 2021-10-19 DIAGNOSIS — I639 Cerebral infarction, unspecified: Secondary | ICD-10-CM | POA: Diagnosis not present

## 2021-10-19 DIAGNOSIS — I4891 Unspecified atrial fibrillation: Secondary | ICD-10-CM | POA: Diagnosis not present

## 2021-10-19 DIAGNOSIS — I10 Essential (primary) hypertension: Secondary | ICD-10-CM | POA: Diagnosis not present

## 2021-10-19 DIAGNOSIS — I5189 Other ill-defined heart diseases: Secondary | ICD-10-CM | POA: Diagnosis not present

## 2021-10-19 NOTE — Progress Notes (Unsigned)
[employer] event monitor serial # [id number] mailed to patient 10/06/21.   Applied in office 10/19/21.  Dr. Curt Bears to read. [facility] [employer]

## 2021-10-26 DIAGNOSIS — I639 Cerebral infarction, unspecified: Secondary | ICD-10-CM | POA: Diagnosis not present

## 2021-10-26 DIAGNOSIS — R2681 Unsteadiness on feet: Secondary | ICD-10-CM | POA: Diagnosis not present

## 2021-10-26 DIAGNOSIS — M6281 Muscle weakness (generalized): Secondary | ICD-10-CM | POA: Diagnosis not present

## 2021-10-30 DIAGNOSIS — M6281 Muscle weakness (generalized): Secondary | ICD-10-CM | POA: Diagnosis not present

## 2021-10-30 DIAGNOSIS — I639 Cerebral infarction, unspecified: Secondary | ICD-10-CM | POA: Diagnosis not present

## 2021-10-30 DIAGNOSIS — R2681 Unsteadiness on feet: Secondary | ICD-10-CM | POA: Diagnosis not present

## 2021-11-05 ENCOUNTER — Ambulatory Visit: Payer: PPO | Admitting: Adult Health

## 2021-11-05 ENCOUNTER — Encounter: Payer: Self-pay | Admitting: Adult Health

## 2021-11-05 VITALS — BP 131/73 | HR 71 | Ht 60.0 in | Wt 114.0 lb

## 2021-11-05 DIAGNOSIS — E785 Hyperlipidemia, unspecified: Secondary | ICD-10-CM | POA: Diagnosis not present

## 2021-11-05 DIAGNOSIS — I6349 Cerebral infarction due to embolism of other cerebral artery: Secondary | ICD-10-CM

## 2021-11-05 NOTE — Patient Instructions (Signed)
Your Plan:  Continue aspirin 81 mg daily Blood pressure goal <130/90 Cholesterol LDL goal <70 Diabetes goal A1c <7 Monitor diet and try to exercise  If you would like a sleep consult please let us know If headaches or memory worsen please let us know Certainly if you have any strokelike symptoms you should proceed to your nearest emergency room  Thank you for coming to see [place] at [facility]. I hope we have been able to provide you high quality care today.  You may receive a patient satisfaction survey over the next few weeks. We would appreciate your feedback and comments so that we may continue to improve ourselves and the health of our patients.

## 2021-11-05 NOTE — Progress Notes (Signed)
PATIENT: Cassandra Alexander DOB: [birth year]  REASON FOR VISIT: follow up HISTORY FROM: patient PRIMARY NEUROLOGIST: Dr. Leonie Man  Chief Complaint  Patient presents with   Follow-up    Pt in 19  with husband Pt here for stroke f/u  Pt states some headaches of left side of head,fatigue, loss of memory       HISTORY OF PRESENT ILLNESS: Today 11/05/21:  Cassandra Alexander is a 76 year old female with a history of stroke.  She returns today for follow-up.  She is currently on aspirin 81 mg.  Remains on Lipitor 80 mg for her cholesterol.  Reports that she is having headaches on the left side of the head.  Reports that her headaches are not constant.  States that may occur every couple days.  She also has noticed more fatigue since her stroke.  Her husband reports that she does snore he is not sure if he has witnessed any apneic events.  She denies any significant daytime sleepiness.  She is following with primary care provider as well.  She has a cardiac monitor on currently.  She does notice some trouble with her memory but is not consistent.  Husband worries that it may be due to hydrocodone. she returns today for an evaluation.  HISTORY Ms. Cassandra Alexander is a 76 y.o. female with history of anxiety, depression, TIA, cerebral aneurysm status post coiling, hypertension, gastritis, dysphagia, esophageal web, GERD, partial colectomy, IBS presenting with left-sided weakness.   Stroke:  Acute right insula to right posterior basal ganglia/corona radiata ischemic infarct with petechial blood likely due to embolus and chronic small vessel disease  Etiology:  likely embolic  CT head No acute abnormality.  CTA head & neck No large vessel occlusion, hemodynamically significant stenosis, or evidence of dissection. Stable appearance of coiled aneurysm with evaluation limited by artifact. MRI   1. Approximate 2.5 cm acute ischemic infarct extending from the right insula to the right posterior basal ganglia/corona radiata.  Associated petechial blood products without hemorrhagic transformation or significant mass effect. 2. No other acute intracranial abnormality. 3. Underlying mild chronic microvascular ischemic disease with a few scattered remote bilateral cerebellar infarcts 2D Echo pending Recommend loop recorder at discharge or as outpatient LDL 133 HgbA1c 5.8 VTE prophylaxis - SCD's No antithrombotic prior to admission, now on aspirin 81 mg daily and clopidogrel 75 mg daily. For 3 weeks then ASA alone  REVIEW OF SYSTEMS: Out of a complete 14 system review of symptoms, the patient complains only of the following symptoms, and all other reviewed systems are negative.  ALLERGIES: Allergies  Allergen Reactions   Cortisone Swelling    Caused facial swelling and headache after receiving cortisone injection Other reaction(s): facial swelling   Penicillins Rash    Childhood reaction Has patient had a PCN reaction causing immediate rash, facial/tongue/throat swelling, SOB or lightheadedness with hypotension: Yes Has patient had a PCN reaction causing severe rash involving mucus membranes or skin necrosis: No Has patient had a PCN reaction that required hospitalization No Has patient had a PCN reaction occurring within the last 10 years: No If all of the above answers are "NO", then may proceed with Cephalosporin use.     HOME MEDICATIONS: Outpatient Medications Prior to Visit  Medication Sig Dispense Refill   ALPRAZolam (XANAX) 1 MG tablet Take 1-1/2 tablet by mouth  daily at bedtime     aspirin EC 81 MG tablet Take 1 tablet (81 mg total) by mouth daily. Swallow whole. 30 tablet  12   atorvastatin (LIPITOR) 80 MG tablet Take 1 tablet (80 mg total) by mouth daily. 30 tablet 11   citalopram (CELEXA) 10 MG tablet Take 10 mg by mouth at bedtime.     HYDROcodone-acetaminophen (NORCO) 10-325 MG tablet Take 3 tablets by mouth daily.     imipramine (TOFRANIL) 50 MG tablet Take 2 tablets by mouth at bedtime.      lisinopril (ZESTRIL) 10 MG tablet Take 10 mg by mouth daily.     mesalamine (LIALDA) 1.2 g EC tablet Take 2.4 g by mouth daily with breakfast.     pantoprazole (PROTONIX) 40 MG tablet Take 40 mg by mouth every morning.     No facility-administered medications prior to visit.    PAST MEDICAL HISTORY: Past Medical History:  Diagnosis Date   Anxiety    Cerebral aneurysm 2006   s/p  coiling right side  ("find during an MRI for headaches")   PCOM   Depression    Diverticula of colon    Headache 07/07/2015   Hyperlipidemia    IBS (irritable bowel syndrome)    Left sided numbness 07/07/2015   Osteoarthritis    knees, back, hands, hips   Primary localized osteoarthritis of left knee 05/16/2018   Primary localized osteoarthritis of left knee 05/16/2018   Pulmonary nodule 1 cm or greater in diameter 05/20/2021   S/P left unicompartmental knee replacement 05/16/2018   TIA (transient ischemic attack) 07/07/2015   Wears glasses     PAST SURGICAL HISTORY: Past Surgical History:  Procedure Laterality Date   ANEURYSM COILING  2006 @[facility]    right PCOM   CESAREAN SECTION  x2  last one  1970s   COLONOSCOPY     LUMBAR FUSION  2017   --  dr Trenton Gammon per pt   PARTIAL KNEE ARTHROPLASTY Left 05/16/2018   Procedure: UNICOMPARTMENTAL KNEE;  Surgeon: Marchia Bond, MD;  Location: [facility];  Service: Orthopedics;  Laterality: Left;   RECTAL PROLAPSE REPAIR  2004 approx.  @[facility]    TONSILLECTOMY AND ADENOIDECTOMY  age 54   TOTAL KNEE ARTHROPLASTY Right early 2000s   VAGINAL HYSTERECTOMY  eary age 57s   ovaries remain    FAMILY HISTORY: Family History  Problem Relation Age of Onset   Hypertension Mother    Stroke Mother    Osteoarthritis Father     SOCIAL HISTORY: Social History   Socioeconomic History   Marital status: Married    Spouse name: Not on file   Number of children: Not on file   Years of education: Not on file   Highest education level: Not on file  Occupational History   Not on file   Tobacco Use   Smoking status: Former    Years: 20.00    Types: Cigarettes    Quit date: 03/15/1990    Years since quitting: 31.6   Smokeless tobacco: Never  Vaping Use   Vaping Use: Never used  Substance and Sexual Activity   Alcohol use: Not Currently   Drug use: Never   Sexual activity: Not on file  Other Topics Concern   Not on file  Social History Narrative   Not on file   Social Determinants of Health   Financial Resource Strain: Not on file  Food Insecurity: Not on file  Transportation Needs: Not on file  Physical Activity: Not on file  Stress: Not on file  Social Connections: Not on file  Intimate Partner Violence: Not on file      PHYSICAL EXAM  Vitals:   11/05/21 0923  BP: 131/73  Pulse: 71  Weight: 114 lb (51.7 kg)  Height: 5' (1.524 m)   Body mass index is 22.26 kg/m.  Generalized: Well developed, in no acute distress   Neurological examination  Mentation: Alert oriented to time, place, history taking. Follows all commands speech and language fluent Cranial nerve II-XII: Pupils were equal round reactive to light. Extraocular movements were full, visual field were full on confrontational test. Facial sensation and strength were normal. Uvula tongue midline. Head turning and shoulder shrug  were normal and symmetric. Motor: The motor testing reveals 5 over 5 strength of all 4 extremities. Good symmetric motor tone is noted throughout.  Sensory: Sensory testing is intact to soft touch on all 4 extremities. No evidence of extinction is noted.  Coordination: Cerebellar testing reveals good finger-nose-finger and heel-to-shin bilaterally.  Gait and station: Gait is normal.  Reflexes: Deep tendon reflexes are symmetric and normal bilaterally.   DIAGNOSTIC DATA (LABS, IMAGING, TESTING) - I reviewed patient records, labs, notes, testing and imaging myself where available.  Lab Results  Component Value Date   WBC 6.1 09/25/2021   HGB 15.3 (H) 09/25/2021    HCT 45.0 09/25/2021   MCV 93.6 09/25/2021   PLT 312 09/25/2021      Component Value Date/Time   NA 140 09/25/2021 1033   K 4.3 09/25/2021 1033   CL 104 09/25/2021 1033   CO2 28 09/25/2021 1009   GLUCOSE 100 (H) 09/25/2021 1033   GLUCOSE 94 01/26/2006 1026   BUN 11 09/25/2021 1033   CREATININE 0.60 09/25/2021 1033   CALCIUM 9.3 09/25/2021 1009   PROT 7.0 09/25/2021 1009   ALBUMIN 4.1 09/25/2021 1009   AST 19 09/25/2021 1009   ALT 12 09/25/2021 1009   ALKPHOS 55 09/25/2021 1009   BILITOT 0.7 09/25/2021 1009   GFRNONAA >60 09/25/2021 1009   GFRAA >60 05/17/2018 0516   Lab Results  Component Value Date   CHOL 219 (H) 09/26/2021   HDL 73 09/26/2021   LDLCALC 133 (H) 09/26/2021   LDLDIRECT 83.4 06/09/2010   TRIG 67 09/26/2021   CHOLHDL 3.0 09/26/2021   Lab Results  Component Value Date   HGBA1C 5.8 (H) 09/26/2021   Lab Results  Component Value Date   VITAMINB12 323 07/08/2015   Lab Results  Component Value Date   TSH 3.116 07/08/2015      ASSESSMENT AND PLAN 76 y.o. year old female  has a past medical history of Anxiety, Cerebral aneurysm (2006), Depression, Diverticula of colon, Headache (07/07/2015), Hyperlipidemia, IBS (irritable bowel syndrome), Left sided numbness (07/07/2015), Osteoarthritis, Primary localized osteoarthritis of left knee (05/16/2018), Primary localized osteoarthritis of left knee (05/16/2018), Pulmonary nodule 1 cm or greater in diameter (05/20/2021), 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 here with:  Stroke:  Acute right insula to right posterior basal ganglia/corona radiata ischemic infarct with petechial blood likely due to embolus and chronic small vessel disease  Etiology:  likely embolic  Continue aspirin 81 mg daily  for secondary stroke prevention.  Discussed secondary stroke prevention measures and importance of close PCP follow up for aggressive stroke risk factor  management. I have gone over the pathophysiology of stroke, warning signs and symptoms, risk factors and their management in some detail with instructions to go to the closest emergency room for symptoms of concern. HTN: BP goal <130/90.  Stable on Lisinopril per PCP HLD: LDL goal <70. Recent LDL 133  on Lipitor 80 mg .  DMII: A1c goal<7.0. Recent A1c 5.8.  Encouraged patient to monitor diet and encouraged exercise Advised that if her headache frequency does not improve or she feels that her memory is changing she should come back to see [place].  Certainly for any severe symptoms go to the ED FU with our office PRN       Ward Givens, MSN, NP-C 11/05/2021, 9:43 AM [facility] [address], [place], [place] [address] [phone]

## 2021-11-08 NOTE — Progress Notes (Signed)
I agree with the above plan 

## 2021-11-09 DIAGNOSIS — M6281 Muscle weakness (generalized): Secondary | ICD-10-CM | POA: Diagnosis not present

## 2021-11-09 DIAGNOSIS — I639 Cerebral infarction, unspecified: Secondary | ICD-10-CM | POA: Diagnosis not present

## 2021-11-09 DIAGNOSIS — R2681 Unsteadiness on feet: Secondary | ICD-10-CM | POA: Diagnosis not present

## 2021-11-13 DIAGNOSIS — R2681 Unsteadiness on feet: Secondary | ICD-10-CM | POA: Diagnosis not present

## 2021-11-13 DIAGNOSIS — M6281 Muscle weakness (generalized): Secondary | ICD-10-CM | POA: Diagnosis not present

## 2021-11-13 DIAGNOSIS — I639 Cerebral infarction, unspecified: Secondary | ICD-10-CM | POA: Diagnosis not present

## 2021-11-17 DIAGNOSIS — Z1231 Encounter for screening mammogram for malignant neoplasm of breast: Secondary | ICD-10-CM | POA: Diagnosis not present

## 2021-11-20 DIAGNOSIS — R928 Other abnormal and inconclusive findings on diagnostic imaging of breast: Secondary | ICD-10-CM | POA: Diagnosis not present

## 2021-11-20 DIAGNOSIS — R922 Inconclusive mammogram: Secondary | ICD-10-CM | POA: Diagnosis not present

## 2021-11-30 ENCOUNTER — Ambulatory Visit: Payer: PPO | Attending: Cardiology | Admitting: Cardiology

## 2021-11-30 ENCOUNTER — Encounter: Payer: Self-pay | Admitting: Cardiology

## 2021-11-30 VITALS — BP 112/68 | HR 68 | Ht 60.0 in | Wt 112.8 lb

## 2021-11-30 DIAGNOSIS — I639 Cerebral infarction, unspecified: Secondary | ICD-10-CM | POA: Diagnosis not present

## 2021-11-30 NOTE — Progress Notes (Signed)
Electrophysiology Office Note   Date:  11/30/2021   ID:  Cassandra Alexander, DOB February 25, 1946, MRN [id number]  PCP:  Aurea Graff.Marlou Sa, MD  Cardiologist:   Primary Electrophysiologist:  Deziray Nabi Meredith Leeds, MD    Chief Complaint: CVA   History of Present Illness: Cassandra Alexander is a 76 y.o. female who is being seen today for the evaluation of CVA at the request of Alroy Dust, L.Marlou Sa, MD. Presenting today for electrophysiology evaluation.  She has a history significant for cerebral aneurysm post coiling in 2006, hyperlipidemia.  She was admitted to the hospital 09/25/2021 with left-sided weakness.  She was found to have an acute right insula to right posterior basilar ganglia corona radiata ischemic infarct.  She presents today for possible loop monitor implant.  Since her stroke, she has recovered function of her left side.  She continues to have intermittent episodes of confusion.  Today, she denies symptoms of palpitations, chest pain, shortness of breath, orthopnea, PND, lower extremity edema, claudication, dizziness, presyncope, syncope, bleeding, or neurologic sequela. The patient is tolerating medications without difficulties.    Past Medical History:  Diagnosis Date   Anxiety    Cerebral aneurysm 2006   s/p  coiling right side  ("find during an MRI for headaches")   PCOM   Depression    Diverticula of colon    Headache 07/07/2015   Hyperlipidemia    IBS (irritable bowel syndrome)    Left sided numbness 07/07/2015   Osteoarthritis    knees, back, hands, hips   Primary localized osteoarthritis of left knee 05/16/2018   Primary localized osteoarthritis of left knee 05/16/2018   Pulmonary nodule 1 cm or greater in diameter 05/20/2021   S/P left unicompartmental knee replacement 05/16/2018   TIA (transient ischemic attack) 07/07/2015   Wears glasses    Past Surgical History:  Procedure Laterality Date   ANEURYSM COILING  2006 @[facility]    right PCOM   CESAREAN SECTION  x2  last one  1970s    COLONOSCOPY     LUMBAR FUSION  2017   --  dr Trenton Gammon per pt   PARTIAL KNEE ARTHROPLASTY Left 05/16/2018   Procedure: UNICOMPARTMENTAL KNEE;  Surgeon: Marchia Bond, MD;  Location: [facility];  Service: Orthopedics;  Laterality: Left;   RECTAL PROLAPSE REPAIR  2004 approx.  @[facility]    TONSILLECTOMY AND ADENOIDECTOMY  age 64   TOTAL KNEE ARTHROPLASTY Right early 2000s   VAGINAL HYSTERECTOMY  eary age 21s   ovaries remain     Current Outpatient Medications  Medication Sig Dispense Refill   ALPRAZolam (XANAX) 1 MG tablet Take 1-1/2 tablet by mouth  daily at bedtime     aspirin EC 81 MG tablet Take 1 tablet (81 mg total) by mouth daily. Swallow whole. 30 tablet 12   atorvastatin (LIPITOR) 80 MG tablet Take 1 tablet (80 mg total) by mouth daily. 30 tablet 11   citalopram (CELEXA) 10 MG tablet Take 10 mg by mouth at bedtime.     HYDROcodone-acetaminophen (NORCO) 10-325 MG tablet Take 3 tablets by mouth daily.     imipramine (TOFRANIL) 50 MG tablet Take 2 tablets by mouth at bedtime.     lisinopril (ZESTRIL) 10 MG tablet Take 10 mg by mouth daily.     mesalamine (LIALDA) 1.2 g EC tablet Take 2.4 g by mouth daily with breakfast.     pantoprazole (PROTONIX) 40 MG tablet Take 40 mg by mouth every morning.     No current facility-administered medications for this  visit.    Allergies:   Cortisone and Penicillins   Social History:  The patient  reports that she quit smoking about 31 years ago. Her smoking use included cigarettes. She has never used smokeless tobacco. She reports that she does not currently use alcohol. She reports that she does not use drugs.   Family History:  The patient's family history includes Hypertension in her mother; Osteoarthritis in her father; Stroke in her mother.    ROS:  Please see the history of present illness.   Otherwise, review of systems is positive for none.   All other systems are reviewed and negative.    PHYSICAL EXAM: VS:  BP 112/68   Pulse 68   Ht 5' (1.524  m)   Wt 112 lb 12.8 oz (51.2 kg)   SpO2 96%   BMI 22.03 kg/m  , BMI Body mass index is 22.03 kg/m. GEN: Well nourished, well developed, in no acute distress  HEENT: normal  Neck: no JVD, carotid bruits, or masses Cardiac: RRR; no murmurs, rubs, or gallops,no edema  Respiratory:  clear to auscultation bilaterally, normal work of breathing GI: soft, nontender, nondistended, + BS MS: no deformity or atrophy  Skin: warm and dry Neuro:  Strength and sensation are intact Psych: euthymic mood, full affect  EKG:  EKG is not ordered today. Personal review of the ekg ordered 10/06/21 shows sinus rhythm, rate 69  Recent Labs: 09/25/2021: ALT 12; BUN 11; Creatinine, Ser 0.60; Hemoglobin 15.3; Platelets 312; Potassium 4.3; Sodium 140    Lipid Panel     Component Value Date/Time   CHOL 219 (H) 09/26/2021 0254   TRIG 67 09/26/2021 0254   TRIG 67 01/26/2006 1026   HDL 73 09/26/2021 0254   CHOLHDL 3.0 09/26/2021 0254   VLDL 13 09/26/2021 0254   LDLCALC 133 (H) 09/26/2021 0254   LDLDIRECT 83.4 06/09/2010 1014     Wt Readings from Last 3 Encounters:  11/30/21 112 lb 12.8 oz (51.2 kg)  11/05/21 114 lb (51.7 kg)  10/06/21 114 lb (51.7 kg)      Other studies Reviewed: Additional studies/ records that were reviewed today include: TTE 09/26/21  Review of the above records today demonstrates:   1. Left ventricular ejection fraction, by estimation, is 55 to 60%. The  left ventricle has normal function. The left ventricle has no regional  wall motion abnormalities. There is mild asymmetric left ventricular  hypertrophy of the basal-septal segment.  Left ventricular diastolic parameters are consistent with Grade I  diastolic dysfunction (impaired relaxation).   2. Right ventricular systolic function is normal. The right ventricular  size is normal. There is normal pulmonary artery systolic pressure.   3. The mitral valve is grossly normal. Trivial mitral valve  regurgitation.   4. The  aortic valve is tricuspid. There is mild calcification of the  aortic valve. There is mild thickening of the aortic valve. Aortic valve  regurgitation is trivial. Aortic valve sclerosis/calcification is present,  without any evidence of aortic  stenosis.   5. The inferior vena cava is normal in size with greater than 50%  respiratory variability, suggesting right atrial pressure of 3 mmHg.   Cardiac monitor 11/18/2021 personally reviewed Predominant rhythm was sinus rhythm 0% atrial fibrillation burden Less than 1% ventricular and supraventricular ectopy No triggered episodes recorded  ASSESSMENT AND PLAN:  1.  Cryptogenic stroke: To this point, and no causes been found.  She wore a 30-day monitor without evidence of atrial fibrillation.  Review  of ECG showed no evidence of atrial fibrillation as well.  She would benefit from loop monitor implant.  Risk and benefits were discussed which were bleeding and infection.  She understands these risks and is agreed to the procedure.   Current medicines are reviewed at length with the patient today.   The patient does not have concerns regarding her medicines.  The following changes were made today:  none  Labs/ tests ordered today include:  No orders of the defined types were placed in this encounter.    Disposition:   FU with Rolando Whitby ending loop monitor results  Signed, Cassandra Alexander Meredith Leeds, MD  11/30/2021 4:13 PM     [address] [address] [phone] (office) [phone] (fax)  SURGEON:  Kadeem Hyle Meredith Leeds, MD     PREPROCEDURE DIAGNOSIS:  Cryptogenic stroke    POSTPROCEDURE DIAGNOSIS: Cryptogenic stroke     PROCEDURES:   1. Implantable loop recorder implantation    INTRODUCTION:  Cassandra Alexander presents with a history of cryptogenic stroke The costs of loop recorder monitoring have been discussed with the patient.    DESCRIPTION OF PROCEDURE:  Informed written consent  was obtained.  The patient required no sedation for the procedure today.  Mapping over the patient's chest was performed to identify the area where electrograms were most prominent for ILR recording.  This area was found to be the left parasternal region over the 4th intercostal space. The patients left chest was therefore prepped and draped in the usual sterile fashion. The skin overlying the left parasternal region was infiltrated with lidocaine for local analgesia.  A 0.5-cm incision was made over the left parasternal region over the 3rd intercostal space.  A subcutaneous ILR pocket was fashioned using a combination of sharp and blunt dissection.  A Medtronic Reveal LINQ (serial # [id number]) implantable loop recorder was then placed into the pocket  R waves were very prominent and measured 0.41m.  Steri- Strips and a sterile dressing were then applied.  There were no early apparent complications.     CONCLUSIONS:   1. Successful implantation of a implantable loop recorder for a history of cryptogenic stroke  2. No early apparent complications.   Jaziah Kwasnik MMeredith Leeds MD  11/30/2021 4:13 PM

## 2021-11-30 NOTE — Patient Instructions (Signed)
Medication Instructions:  Your physician recommends that you continue on your current medications as directed. Please refer to the Current Medication list given to you today.  Labwork: None ordered.  Testing/Procedures: None ordered.  Follow-Up:  Your physician wants you to follow-up as needed with Dr. Curt Bears..  You will receive a reminder letter in the mail two months in advance. If you don't receive a letter, please call our office to schedule the follow-up appointment.    Implantable Loop Recorder Placement, Care After This sheet gives you information about how to care for yourself after your procedure. Your health care provider may also give you more specific instructions. If you have problems or questions, contact your health care provider. What can I expect after the procedure? After the procedure, it is common to have: Soreness or discomfort near the incision. Some swelling or bruising near the incision.  Follow these instructions at home: Incision care  Monitor your cardiac device site for redness, swelling, and drainage. Call the device clinic at [phone] if you experience these symptoms or fever/chills.  Keep the large square bandage on your site for 24 hours and then you may remove it yourself. Keep the steri-strips underneath in place.   You may shower after 72 hours / 3 days from your procedure with the steri-strips in place. They will usually fall off on their own, or may be removed after 10 days. Pat dry.   Avoid lotions, ointments, or perfumes over your incision until it is well-healed.  Please do not submerge in water until your site is completely healed.   Your device is MRI compatible.   Remote monitoring is used to monitor your cardiac device from home. This monitoring is scheduled every month by our office. It allows [place] to keep an eye on the function of your device to ensure it is working properly.  If your wound site starts to bleed apply pressure.     For help with the monitor please call Medtronic Monitor Support Specialist directly at [phone].    If you have any questions/concerns please call the device clinic at [phone].  Activity  Return to your normal activities.  General instructions Follow instructions from your health care provider about how to manage your implantable loop recorder and transmit the information. Learn how to activate a recording if this is necessary for your type of device. You may go through a metal detection gate, and you may let someone hold a metal detector over your chest. Show your ID card if needed. Do not have an MRI unless you check with your health care provider first. Take over-the-counter and prescription medicines only as told by your health care provider. Keep all follow-up visits as told by your health care provider. This is important. Contact a health care provider if: You have redness, swelling, or pain around your incision. You have a fever. You have pain that is not relieved by your pain medicine. You have triggered your device because of fainting (syncope) or because of a heartbeat that feels like it is racing, slow, fluttering, or skipping (palpitations). Get help right away if you have: Chest pain. Difficulty breathing. Summary After the procedure, it is common to have soreness or discomfort near the incision. Change your dressing as told by your health care provider. Follow instructions from your health care provider about how to manage your implantable loop recorder and transmit the information. Keep all follow-up visits as told by your health care provider. This is important. This information is  not intended to replace advice given to you by your health care provider. Make sure you discuss any questions you have with your health care provider. Document Released: 02/10/2015 Document Revised: 04/16/2017 Document Reviewed: 04/16/2017 Elsevier Patient Education  2020 [employer].

## 2021-12-22 ENCOUNTER — Other Ambulatory Visit (HOSPITAL_COMMUNITY): Payer: Self-pay

## 2021-12-23 ENCOUNTER — Other Ambulatory Visit (HOSPITAL_COMMUNITY): Payer: Self-pay

## 2021-12-23 MED ORDER — HYDROCODONE-ACETAMINOPHEN 10-325 MG PO TABS
1.0000 | ORAL_TABLET | ORAL | 0 refills | Status: DC
  Filled 2021-12-23: qty 100, 25d supply, fill #0

## 2022-01-04 ENCOUNTER — Ambulatory Visit (INDEPENDENT_AMBULATORY_CARE_PROVIDER_SITE_OTHER): Payer: PPO

## 2022-01-04 DIAGNOSIS — I639 Cerebral infarction, unspecified: Secondary | ICD-10-CM | POA: Diagnosis not present

## 2022-01-05 LAB — CUP PACEART REMOTE DEVICE CHECK
Date Time Interrogation Session: 20231023224333
Implantable Pulse Generator Implant Date: 20230918

## 2022-01-06 ENCOUNTER — Emergency Department (HOSPITAL_COMMUNITY): Payer: PPO | Admitting: Certified Registered"

## 2022-01-06 ENCOUNTER — Emergency Department (HOSPITAL_COMMUNITY): Payer: PPO

## 2022-01-06 ENCOUNTER — Other Ambulatory Visit: Payer: Self-pay

## 2022-01-06 ENCOUNTER — Inpatient Hospital Stay (HOSPITAL_COMMUNITY): Payer: PPO

## 2022-01-06 ENCOUNTER — Inpatient Hospital Stay (HOSPITAL_COMMUNITY)
Admission: EM | Admit: 2022-01-06 | Discharge: 2022-01-08 | DRG: 023 | Disposition: A | Discharge status: Home / Self Care | Payer: PPO | Attending: Neurology | Admitting: Neurology

## 2022-01-06 ENCOUNTER — Encounter (HOSPITAL_COMMUNITY)
Admission: EM | Disposition: A | Discharge status: Home / Self Care | Payer: Self-pay | Source: Home / Self Care | Attending: Neurology

## 2022-01-06 DIAGNOSIS — Z981 Arthrodesis status: Secondary | ICD-10-CM | POA: Diagnosis not present

## 2022-01-06 DIAGNOSIS — F419 Anxiety disorder, unspecified: Secondary | ICD-10-CM | POA: Diagnosis present

## 2022-01-06 DIAGNOSIS — R1310 Dysphagia, unspecified: Secondary | ICD-10-CM | POA: Diagnosis present

## 2022-01-06 DIAGNOSIS — I6522 Occlusion and stenosis of left carotid artery: Secondary | ICD-10-CM | POA: Diagnosis not present

## 2022-01-06 DIAGNOSIS — F32A Depression, unspecified: Secondary | ICD-10-CM | POA: Diagnosis not present

## 2022-01-06 DIAGNOSIS — H5500 Unspecified nystagmus: Secondary | ICD-10-CM | POA: Diagnosis not present

## 2022-01-06 DIAGNOSIS — Z87891 Personal history of nicotine dependence: Secondary | ICD-10-CM | POA: Diagnosis not present

## 2022-01-06 DIAGNOSIS — F418 Other specified anxiety disorders: Secondary | ICD-10-CM

## 2022-01-06 DIAGNOSIS — I6302 Cerebral infarction due to thrombosis of basilar artery: Secondary | ICD-10-CM | POA: Diagnosis not present

## 2022-01-06 DIAGNOSIS — Z96653 Presence of artificial knee joint, bilateral: Secondary | ICD-10-CM | POA: Diagnosis present

## 2022-01-06 DIAGNOSIS — Z79899 Other long term (current) drug therapy: Secondary | ICD-10-CM | POA: Diagnosis not present

## 2022-01-06 DIAGNOSIS — Z20822 Contact with and (suspected) exposure to covid-19: Secondary | ICD-10-CM | POA: Diagnosis not present

## 2022-01-06 DIAGNOSIS — Z4682 Encounter for fitting and adjustment of non-vascular catheter: Secondary | ICD-10-CM | POA: Diagnosis not present

## 2022-01-06 DIAGNOSIS — R29714 NIHSS score 14: Secondary | ICD-10-CM | POA: Diagnosis present

## 2022-01-06 DIAGNOSIS — I63212 Cerebral infarction due to unspecified occlusion or stenosis of left vertebral arteries: Secondary | ICD-10-CM | POA: Diagnosis not present

## 2022-01-06 DIAGNOSIS — R404 Transient alteration of awareness: Secondary | ICD-10-CM | POA: Diagnosis not present

## 2022-01-06 DIAGNOSIS — Z7982 Long term (current) use of aspirin: Secondary | ICD-10-CM

## 2022-01-06 DIAGNOSIS — I6389 Other cerebral infarction: Secondary | ICD-10-CM | POA: Diagnosis not present

## 2022-01-06 DIAGNOSIS — K219 Gastro-esophageal reflux disease without esophagitis: Secondary | ICD-10-CM | POA: Diagnosis present

## 2022-01-06 DIAGNOSIS — I651 Occlusion and stenosis of basilar artery: Secondary | ICD-10-CM | POA: Diagnosis not present

## 2022-01-06 DIAGNOSIS — I639 Cerebral infarction, unspecified: Secondary | ICD-10-CM | POA: Diagnosis not present

## 2022-01-06 DIAGNOSIS — I700 Atherosclerosis of aorta: Secondary | ICD-10-CM | POA: Diagnosis present

## 2022-01-06 DIAGNOSIS — R4182 Altered mental status, unspecified: Secondary | ICD-10-CM | POA: Diagnosis not present

## 2022-01-06 DIAGNOSIS — Z452 Encounter for adjustment and management of vascular access device: Secondary | ICD-10-CM | POA: Diagnosis not present

## 2022-01-06 DIAGNOSIS — Z8673 Personal history of transient ischemic attack (TIA), and cerebral infarction without residual deficits: Secondary | ICD-10-CM

## 2022-01-06 DIAGNOSIS — R29810 Facial weakness: Secondary | ICD-10-CM | POA: Diagnosis present

## 2022-01-06 DIAGNOSIS — I6521 Occlusion and stenosis of right carotid artery: Secondary | ICD-10-CM | POA: Diagnosis not present

## 2022-01-06 DIAGNOSIS — I10 Essential (primary) hypertension: Secondary | ICD-10-CM | POA: Diagnosis not present

## 2022-01-06 DIAGNOSIS — E785 Hyperlipidemia, unspecified: Secondary | ICD-10-CM | POA: Diagnosis not present

## 2022-01-06 DIAGNOSIS — Z781 Physical restraint status: Secondary | ICD-10-CM | POA: Diagnosis not present

## 2022-01-06 DIAGNOSIS — J9691 Respiratory failure, unspecified with hypoxia: Secondary | ICD-10-CM | POA: Diagnosis not present

## 2022-01-06 DIAGNOSIS — R61 Generalized hyperhidrosis: Secondary | ICD-10-CM | POA: Diagnosis not present

## 2022-01-06 DIAGNOSIS — Z90710 Acquired absence of both cervix and uterus: Secondary | ICD-10-CM | POA: Diagnosis not present

## 2022-01-06 DIAGNOSIS — Z823 Family history of stroke: Secondary | ICD-10-CM | POA: Diagnosis not present

## 2022-01-06 DIAGNOSIS — R4189 Other symptoms and signs involving cognitive functions and awareness: Secondary | ICD-10-CM | POA: Diagnosis not present

## 2022-01-06 DIAGNOSIS — J9811 Atelectasis: Secondary | ICD-10-CM | POA: Diagnosis not present

## 2022-01-06 DIAGNOSIS — R29818 Other symptoms and signs involving the nervous system: Secondary | ICD-10-CM | POA: Diagnosis not present

## 2022-01-06 DIAGNOSIS — R0689 Other abnormalities of breathing: Secondary | ICD-10-CM

## 2022-01-06 DIAGNOSIS — R410 Disorientation, unspecified: Secondary | ICD-10-CM | POA: Diagnosis not present

## 2022-01-06 DIAGNOSIS — Z8249 Family history of ischemic heart disease and other diseases of the circulatory system: Secondary | ICD-10-CM

## 2022-01-06 HISTORY — PX: IR PERCUTANEOUS ART THROMBECTOMY/INFUSION INTRACRANIAL INC DIAG ANGIO: IMG6087

## 2022-01-06 HISTORY — PX: RADIOLOGY WITH ANESTHESIA: SHX6223

## 2022-01-06 HISTORY — PX: IR CT HEAD LTD: IMG2386

## 2022-01-06 LAB — COMPREHENSIVE METABOLIC PANEL
ALT: 21 U/L (ref 0–44)
AST: 19 U/L (ref 15–41)
Albumin: 3.7 g/dL (ref 3.5–5.0)
Alkaline Phosphatase: 53 U/L (ref 38–126)
Anion gap: 9 (ref 5–15)
BUN: 10 mg/dL (ref 8–23)
CO2: 27 mmol/L (ref 22–32)
Calcium: 8.8 mg/dL — ABNORMAL LOW (ref 8.9–10.3)
Chloride: 103 mmol/L (ref 98–111)
Creatinine, Ser: 0.66 mg/dL (ref 0.44–1.00)
GFR, Estimated: >60 mL/min (ref >60)
Glucose, Bld: 156 mg/dL — ABNORMAL HIGH (ref 70–99)
Potassium: 4 mmol/L (ref 3.5–5.1)
Sodium: 139 mmol/L (ref 135–145)
Total Bilirubin: 0.8 mg/dL (ref 0.3–1.2)
Total Protein: 6.1 g/dL — ABNORMAL LOW (ref 6.5–8.1)

## 2022-01-06 LAB — I-STAT CHEM 8, ED
BUN: 11 mg/dL (ref 8–23)
Calcium, Ion: 1.12 mmol/L — ABNORMAL LOW (ref 1.15–1.40)
Chloride: 103 mmol/L (ref 98–111)
Creatinine, Ser: 0.6 mg/dL (ref 0.44–1.00)
Glucose, Bld: 149 mg/dL — ABNORMAL HIGH (ref 70–99)
HCT: 39 % (ref 36.0–46.0)
Hemoglobin: 13.3 g/dL (ref 12.0–15.0)
Potassium: 4.1 mmol/L (ref 3.5–5.1)
Sodium: 141 mmol/L (ref 135–145)
TCO2: 27 mmol/L (ref 22–32)

## 2022-01-06 LAB — RAPID URINE DRUG SCREEN, HOSP PERFORMED
Amphetamines: NOT DETECTED
Barbiturates: NOT DETECTED
Benzodiazepines: POSITIVE — AB
Cocaine: NOT DETECTED
Opiates: POSITIVE — AB
Tetrahydrocannabinol: NOT DETECTED

## 2022-01-06 LAB — DIFFERENTIAL
Abs Immature Granulocytes: 0.05 10*3/uL (ref 0.00–0.07)
Basophils Absolute: 0 10*3/uL (ref 0.0–0.1)
Basophils Relative: 0 %
Eosinophils Absolute: 0 10*3/uL (ref 0.0–0.5)
Eosinophils Relative: 0 %
Immature Granulocytes: 1 %
Lymphocytes Relative: 39 %
Lymphs Abs: 2.7 10*3/uL (ref 0.7–4.0)
Monocytes Absolute: 0.5 10*3/uL (ref 0.1–1.0)
Monocytes Relative: 7 %
Neutro Abs: 3.6 10*3/uL (ref 1.7–7.7)
Neutrophils Relative %: 53 %

## 2022-01-06 LAB — PROTIME-INR
INR: 1 (ref 0.8–1.2)
Prothrombin Time: 12.8 seconds (ref 11.4–15.2)

## 2022-01-06 LAB — POCT I-STAT 7, (LYTES, BLD GAS, ICA,H+H)
Acid-Base Excess: 1 mmol/L (ref 0.0–2.0)
Bicarbonate: 26.7 mmol/L (ref 20.0–28.0)
Calcium, Ion: 1.18 mmol/L (ref 1.15–1.40)
HCT: 38 % (ref 36.0–46.0)
Hemoglobin: 12.9 g/dL (ref 12.0–15.0)
O2 Saturation: 100 %
Patient temperature: 93.5
Potassium: 3.5 mmol/L (ref 3.5–5.1)
Sodium: 137 mmol/L (ref 135–145)
TCO2: 28 mmol/L (ref 22–32)
pCO2 arterial: 38.6 mmHg (ref 32–48)
pH, Arterial: 7.435 (ref 7.35–7.45)
pO2, Arterial: 445 mmHg — ABNORMAL HIGH (ref 83–108)

## 2022-01-06 LAB — CBC
HCT: 40.3 % (ref 36.0–46.0)
Hemoglobin: 12.9 g/dL (ref 12.0–15.0)
MCH: 30.8 pg (ref 26.0–34.0)
MCHC: 32 g/dL (ref 30.0–36.0)
MCV: 96.2 fL (ref 80.0–100.0)
Platelets: 204 10*3/uL (ref 150–400)
RBC: 4.19 MIL/uL (ref 3.87–5.11)
RDW: 13.5 % (ref 11.5–15.5)
WBC: 6.9 10*3/uL (ref 4.0–10.5)
nRBC: 0 % (ref 0.0–0.2)

## 2022-01-06 LAB — URINALYSIS, ROUTINE W REFLEX MICROSCOPIC
Bilirubin Urine: NEGATIVE
Glucose, UA: NEGATIVE mg/dL
Hgb urine dipstick: NEGATIVE
Ketones, ur: 5 mg/dL — AB
Leukocytes,Ua: NEGATIVE
Nitrite: NEGATIVE
Protein, ur: NEGATIVE mg/dL
Specific Gravity, Urine: 1.032 — ABNORMAL HIGH (ref 1.005–1.030)
pH: 7 (ref 5.0–8.0)

## 2022-01-06 LAB — GLUCOSE, CAPILLARY
Glucose-Capillary: 128 mg/dL — ABNORMAL HIGH (ref 70–99)
Glucose-Capillary: 78 mg/dL (ref 70–99)
Glucose-Capillary: 94 mg/dL (ref 70–99)

## 2022-01-06 LAB — SARS CORONAVIRUS 2 BY RT PCR: SARS Coronavirus 2 by RT PCR: NEGATIVE

## 2022-01-06 LAB — CBG MONITORING, ED: Glucose-Capillary: 158 mg/dL — ABNORMAL HIGH (ref 70–99)

## 2022-01-06 LAB — APTT: aPTT: 23 seconds — ABNORMAL LOW (ref 24–36)

## 2022-01-06 LAB — ETHANOL: Alcohol, Ethyl (B): <10 mg/dL (ref <10)

## 2022-01-06 SURGERY — IR WITH ANESTHESIA
Anesthesia: General

## 2022-01-06 MED ORDER — PROPOFOL 1000 MG/100ML IV EMUL
0.0000 ug/kg/min | INTRAVENOUS | Status: DC
  Administered 2022-01-06 – 2022-01-07 (×5): 50 ug/kg/min via INTRAVENOUS
  Filled 2022-01-06 (×5): qty 100

## 2022-01-06 MED ORDER — LIDOCAINE 2% (20 MG/ML) 5 ML SYRINGE
INTRAMUSCULAR | Status: DC | PRN
  Administered 2022-01-06: 60 mg via INTRAVENOUS

## 2022-01-06 MED ORDER — IOHEXOL 350 MG/ML SOLN
100.0000 mL | Freq: Once | INTRAVENOUS | Status: AC | PRN
  Administered 2022-01-06: 20 mL via INTRAVENOUS

## 2022-01-06 MED ORDER — SUCCINYLCHOLINE CHLORIDE 200 MG/10ML IV SOSY
PREFILLED_SYRINGE | INTRAVENOUS | Status: DC | PRN
  Administered 2022-01-06: 100 mg via INTRAVENOUS

## 2022-01-06 MED ORDER — IOHEXOL 300 MG/ML  SOLN
150.0000 mL | Freq: Once | INTRAMUSCULAR | Status: AC | PRN
  Administered 2022-01-06: 70 mL via INTRA_ARTERIAL

## 2022-01-06 MED ORDER — ACETAMINOPHEN 650 MG RE SUPP: 650.0000 mg | RECTAL | Status: DC | PRN

## 2022-01-06 MED ORDER — POLYETHYLENE GLYCOL 3350 17 G PO PACK: 17.0000 g | PACK | Freq: Every day | ORAL | Status: DC

## 2022-01-06 MED ORDER — CLOPIDOGREL BISULFATE 75 MG PO TABS
75.0000 mg | ORAL_TABLET | Freq: Every day | ORAL | Status: DC
  Filled 2022-01-06: qty 1

## 2022-01-06 MED ORDER — ACETAMINOPHEN 325 MG PO TABS: 650.0000 mg | ORAL_TABLET | ORAL | Status: DC | PRN

## 2022-01-06 MED ORDER — VANCOMYCIN HCL IN DEXTROSE 1-5 GM/200ML-% IV SOLN
INTRAVENOUS | Status: AC
  Filled 2022-01-06: qty 200

## 2022-01-06 MED ORDER — ASPIRIN 81 MG PO CHEW
81.0000 mg | CHEWABLE_TABLET | Freq: Every day | ORAL | Status: DC
  Administered 2022-01-07: 81 mg

## 2022-01-06 MED ORDER — ORAL CARE MOUTH RINSE
15.0000 mL | OROMUCOSAL | Status: DC
  Administered 2022-01-06 – 2022-01-07 (×9): 15 mL via OROMUCOSAL

## 2022-01-06 MED ORDER — ACETAMINOPHEN 160 MG/5ML PO SOLN: 650.0000 mg | ORAL | Status: DC | PRN

## 2022-01-06 MED ORDER — SENNOSIDES-DOCUSATE SODIUM 8.6-50 MG PO TABS: 1.0000 | ORAL_TABLET | Freq: Every evening | ORAL | Status: DC | PRN

## 2022-01-06 MED ORDER — IOHEXOL 350 MG/ML SOLN
100.0000 mL | Freq: Once | INTRAVENOUS | Status: AC | PRN
  Administered 2022-01-06: 100 mL via INTRAVENOUS

## 2022-01-06 MED ORDER — INSULIN ASPART 100 UNIT/ML IJ SOLN
0.0000 [IU] | INTRAMUSCULAR | Status: DC
  Administered 2022-01-06 – 2022-01-07 (×2): 1 [IU] via SUBCUTANEOUS

## 2022-01-06 MED ORDER — CHLORHEXIDINE GLUCONATE CLOTH 2 % EX PADS
6.0000 | MEDICATED_PAD | Freq: Every day | CUTANEOUS | Status: DC
  Administered 2022-01-06 (×2): 6 via TOPICAL

## 2022-01-06 MED ORDER — VANCOMYCIN HCL 1000 MG IV SOLR
INTRAVENOUS | Status: DC | PRN
  Administered 2022-01-06: 1000 mg via INTRAVENOUS

## 2022-01-06 MED ORDER — SODIUM CHLORIDE 0.9 % IV SOLN: INTRAVENOUS | Status: DC

## 2022-01-06 MED ORDER — FENTANYL CITRATE PF 50 MCG/ML IJ SOSY: 25.0000 ug | PREFILLED_SYRINGE | INTRAMUSCULAR | Status: DC | PRN

## 2022-01-06 MED ORDER — ENOXAPARIN SODIUM 40 MG/0.4ML IJ SOSY
40.0000 mg | PREFILLED_SYRINGE | INTRAMUSCULAR | Status: DC
  Administered 2022-01-07 – 2022-01-08 (×2): 40 mg via SUBCUTANEOUS
  Filled 2022-01-06 (×2): qty 0.4

## 2022-01-06 MED ORDER — FENTANYL CITRATE PF 50 MCG/ML IJ SOSY
25.0000 ug | PREFILLED_SYRINGE | INTRAMUSCULAR | Status: DC | PRN
  Administered 2022-01-06: 100 ug via INTRAVENOUS
  Administered 2022-01-06: 50 ug via INTRAVENOUS
  Administered 2022-01-06: 100 ug via INTRAVENOUS
  Administered 2022-01-06 (×2): 50 ug via INTRAVENOUS
  Administered 2022-01-07: 100 ug via INTRAVENOUS
  Administered 2022-01-07: 50 ug via INTRAVENOUS
  Administered 2022-01-07: 100 ug via INTRAVENOUS
  Filled 2022-01-06 (×2): qty 2
  Filled 2022-01-06 (×2): qty 1
  Filled 2022-01-06: qty 2
  Filled 2022-01-06 (×2): qty 1
  Filled 2022-01-06: qty 2

## 2022-01-06 MED ORDER — NITROGLYCERIN 1 MG/10 ML FOR IR/CATH LAB
INTRA_ARTERIAL | Status: AC
  Filled 2022-01-06: qty 10

## 2022-01-06 MED ORDER — DOCUSATE SODIUM 50 MG/5ML PO LIQD
100.0000 mg | Freq: Two times a day (BID) | ORAL | Status: DC
  Administered 2022-01-06: 100 mg
  Filled 2022-01-06 (×2): qty 10

## 2022-01-06 MED ORDER — STROKE: EARLY STAGES OF RECOVERY BOOK: Freq: Once | Status: AC

## 2022-01-06 MED ORDER — CLOPIDOGREL BISULFATE 75 MG PO TABS
75.0000 mg | ORAL_TABLET | Freq: Every day | ORAL | Status: DC
  Administered 2022-01-07: 75 mg

## 2022-01-06 MED ORDER — ORAL CARE MOUTH RINSE: 15.0000 mL | OROMUCOSAL | Status: DC | PRN

## 2022-01-06 MED ORDER — ASPIRIN 81 MG PO CHEW
81.0000 mg | CHEWABLE_TABLET | Freq: Every day | ORAL | Status: DC
  Filled 2022-01-06: qty 1

## 2022-01-06 MED ORDER — PROPOFOL 10 MG/ML IV BOLUS
INTRAVENOUS | Status: DC | PRN
  Administered 2022-01-06: 100 mg via INTRAVENOUS
  Administered 2022-01-06: 30 mg via INTRAVENOUS
  Administered 2022-01-06: 50 mg via INTRAVENOUS

## 2022-01-06 MED ORDER — PANTOPRAZOLE SODIUM 40 MG IV SOLR
40.0000 mg | Freq: Every day | INTRAVENOUS | Status: DC
  Administered 2022-01-06 – 2022-01-07 (×2): 40 mg via INTRAVENOUS
  Filled 2022-01-06 (×2): qty 10

## 2022-01-06 MED ORDER — ROCURONIUM BROMIDE 10 MG/ML (PF) SYRINGE
PREFILLED_SYRINGE | INTRAVENOUS | Status: DC | PRN
  Administered 2022-01-06: 60 mg via INTRAVENOUS

## 2022-01-06 MED ORDER — CLEVIDIPINE BUTYRATE 0.5 MG/ML IV EMUL
0.0000 mg/h | INTRAVENOUS | Status: AC
  Administered 2022-01-06: 2 mg/h via INTRAVENOUS
  Administered 2022-01-06: 1 mg/h via INTRAVENOUS
  Filled 2022-01-06 (×3): qty 50

## 2022-01-06 MED ORDER — LACTATED RINGERS IV SOLN: INTRAVENOUS | Status: DC | PRN

## 2022-01-06 MED ORDER — SODIUM CHLORIDE 0.9% FLUSH: 3.0000 mL | Freq: Once | INTRAVENOUS | Status: DC

## 2022-01-06 NOTE — H&P (Addendum)
Neurology H&P  CC: Code stroke/ AMS  History is obtained from:husband via phone and medical record   HPI:   This is a 76 year old woman with past medical history significant for CVA in June 2023 ischemic with some petechial hemorrhage at that time, cerebral aneurysm status post coiling, hyperlipidemia who was brought in by EMS to [facility] ED for confusion.  Her last known well was 2230 last night.  Husband called to wake her up this morning and she said "okay "and then was mumbling to the dogs.  He did not physically see her.  He took the dogs outside and came back in and found her sitting halfway down the staircase very confused.  He had not seen her normal since the night prior.  NIH stroke scale was initially 12. CTH NAICP. ASPECTS 10. TNK not administered 2/2 presentation outside the window. CTP negative. CTA showed occlusion non-dominant L V4 and embolus in the basilar tip.  Risks, benefits, and alternatives were personally discussed with her husband by Dr. Quinn Axe and Dr. Estanislado Pandy, including the 10% risk of hemorrhage with intervention.  Husband gave informed consent to proceed.  Code IR was activated and patient was taken for thrombectomy.  LKW: 2230 01/05/2022 tpa given?: no, outside window Modified Rankin Scale: 1-No significant post stroke disability and can perform usual duties with stroke symptoms  ROS: Unable to obtain due to altered mental status.   Past Medical History:  Diagnosis Date   Anxiety    Cerebral aneurysm 2006   s/p  coiling right side  ("find during an MRI for headaches")   PCOM   Depression    Diverticula of colon    Headache 07/07/2015   Hyperlipidemia    IBS (irritable bowel syndrome)    Left sided numbness 07/07/2015   Osteoarthritis    knees, back, hands, hips   Primary localized osteoarthritis of left knee 05/16/2018   Primary localized osteoarthritis of left knee 05/16/2018   Pulmonary nodule 1 cm or greater in diameter 05/20/2021   S/P left  unicompartmental knee replacement 05/16/2018   TIA (transient ischemic attack) 07/07/2015   Wears glasses      Family History  Problem Relation Age of Onset   Hypertension Mother    Stroke Mother    Osteoarthritis Father      Social History:  reports that she quit smoking about 31 years ago. Her smoking use included cigarettes. She has never used smokeless tobacco. She reports that she does not currently use alcohol. She reports that she does not use drugs.   Exam: Current vital signs: Wt 51.2 kg   BMI 22.04 kg/m  Vital signs in last 24 hours: Weight:  [51.2 kg] 51.2 kg (10/25 0800)  Physical Exam  Constitutional: Appears well-developed and well-nourished.  Psych: Affect appropriate to situation Eyes: No scleral injection HENT: No OP obstrucion Head: Normocephalic.  Cardiovascular: Normal rate and regular rhythm.  Respiratory: Effort normal and breath sounds normal to anterior ascultation GI: Soft.  No distension. There is no tenderness.  Skin: WDI  Neuro: Mental Status: Patient is drowsy, alert, oriented to person, place, month. Delayed response. Follows commands   Cranial Nerves: II: Visual Fields are full. Pupils are equal, round, and reactive to light.   III,IV, VI: EOMI without ptosis or diploplia.  V: Facial sensation is symmetric to temperature VII: Facial movement is symmetric.  VIII: hearing is intact to voice X: Uvula elevates symmetrically XI: Shoulder shrug is symmetric. XII: tongue is midline without atrophy or  fasciculations.  Motor: Tone is normal. Bulk is normal. All 4 extremities with minimal movement all drop to bed initially. Improvement after CT right arm and leg were antigravity but drifted to bed in 10 seconds. Left arm and leg withdraws to painful stimuli Sensory: Responds to noxious stimuli  Cerebellar: Unable to assess  NIHSS: 1a Level of Conscious.: 1 1b LOC Questions: 0 1c LOC Commands: 0 2 Best Gaze: 0 3 Visual: 0 4 Facial Palsy:  0 5a Motor Arm - left: 3 5b Motor Arm - Right: 3 6a Motor Leg - Left: 3 6b Motor Leg - Right: 3 7 Limb Ataxia: 0 8 Sensory: 0 9 Best Language: 0 10 Dysarthria: 1 11 Extinct. and Inatten.: 0 TOTAL: 14   I have reviewed labs in epic and the results pertinent to this consultation are:  I have reviewed the images obtained: CT head- No evidence of acute intracranial abnormality. ASPECTS is 10.  CTA head and neck with Perf: 1. 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 Basilar artery shows flow proximally but there is an embolus at the basilar tip. Some flow is seen within both posterior cerebral arteries. 2. 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 Limited evaluation because of dense streak artifact. No anterior circulation large vessel occlusion or proximal stenosis. 3. No carotid bifurcation disease. 4. No evidence of infarction by CT perfusion imaging. 5. Aortic atherosclerosis. 6. Emphysema without acute process. 7. The left vertebral artery has 2 origins, 1 arising directly from the aortic arch and a second origin at the left subclavian artery, the 2 branches coming together in the transverse foramen of the lower cervical spine. Beyond that, the vessel is patent to the foramen magnum. Left V4 occlusion described above  CBF (<30%) Volume: 24m Perfusion (Tmax>6.0s) volume: 054mMismatch Volume: 26m57m Primary Diagnosis:  Acute basilar artery occlusion  Secondary Diagnosis: Dysphagia   Assessment:  Recommendations: - Admit to 4N ICU  - BP goal per post IR orders  - Bedside swallow evaluation - EKG - UA, UDS  - CMP, CBC in am  - HgbA1c, fasting lipid panel - MRI of the brain without contrast - Frequent neuro checks - Echocardiogram - Hold all Antiplatelet meds for 24 hrs post IR  - Risk factor modification - Telemetry monitoring - PT consult, OT consult, Speech consult - Stroke team to follow   DenBeulah GandyP,  [facility] Attending Attestation   I examined the patient and discussed plan with Ms. WolRogers Blocker. Above note has been edited by me to reflect my findings and recommendations.  This is a [age]ar old woman with past medical history significant for CVA in June 2023 ischemic with some petechial hemorrhage at that time, cerebral aneurysm status post coiling, hyperlipidemia who [facility] with LKW of night prior. Her exam quickly deteriorated with decreasing level of consciousness, nystagmus, and weakness in all 4 extremities. CTH NAICP. CTA showed L V4 occlusion and embolus in the basilar tip. Code IR was activated and patient is currently undergoing mechanical thrombectomy.  I was present throughout the stroke code and made all significant decisions and personally reviewed CNS imaging, and also discussed CTA results with the neuroradiologist Dr. ShoMaree Erieascular neurologist Dr. SetLeonie Mannd neurointerventionalist Dr. DevEstanislado Pandy phone.   This patient is critically ill and at significant risk of neurological worsening, death and care requires constant monitoring of vital signs,  hemodynamics,respiratory and cardiac monitoring, neurological assessment, discussion with family, other specialists and medical decision making of high complexity. I spent 110 minutes of neurocritical care time  in the care of  this patient. This was time spent independent of any time provided by nurse practitioner or PA.   Su Monks, MD [facility] [phone]   If 7pm- 7am, please page neurology on call as listed in [place].

## 2022-01-06 NOTE — ED Triage Notes (Signed)
Pt BIB EMS due to a code stroke. Pt LSN was 0710 by family talking to her dogs and used the bathroom, family then found to be acutlrey confused. Pt had stroke 3 months ago.

## 2022-01-06 NOTE — Anesthesia Postprocedure Evaluation (Signed)
Anesthesia Post Note  Patient: Cassandra Alexander  Procedure(s) Performed: IR WITH ANESTHESIA     Patient location during evaluation: ICU Anesthesia Type: General Level of consciousness: sedated Pain management: pain level controlled Vital Signs Assessment: post-procedure vital signs reviewed and stable Respiratory status: patient remains intubated per anesthesia plan Cardiovascular status: stable Postop Assessment: no apparent nausea or vomiting Anesthetic complications: no   No notable events documented.  Last Vitals:  Vitals:   01/06/22 1400 01/06/22 1415  BP: 110/60 116/60  Pulse: (!) 55 (!) 55  Resp: 16 16  SpO2: 97% 98%    Last Pain: There were no vitals filed for this visit.               [place]

## 2022-01-06 NOTE — Progress Notes (Signed)
[facility] Progress Note Patient Name: Cassandra Alexander DOB: Oct 03, 1945 MRN: [id number]   Date of Service  01/06/2022  HPI/Events of Note  Multiple issues: 1. Agitation - Patient pulling at ETT. Nursing request for bilateral wrist restraints. 2. A-line now working - Cuff BP felt to be reliable by nursing.   eICU Interventions  Plan: Bilateral soft wrist restraints X 13 hours. D/C A-line.      Intervention Category Major Interventions: Delirium, psychosis, severe agitation - evaluation and management;Other:  Yasser Hepp Cornelia Copa 01/06/2022, 8:22 PM

## 2022-01-06 NOTE — Anesthesia Preprocedure Evaluation (Signed)
Anesthesia Evaluation  Patient identified by MRN, date of birth, ID band Patient confused  Preop documentation limited or incomplete due to emergent nature of procedure.  Airway Mallampati: Unable to assess       Dental   Pulmonary former smoker,    breath sounds clear to auscultation       Cardiovascular hypertension,  Rhythm:regular Rate:Normal     Neuro/Psych  Headaches, PSYCHIATRIC DISORDERS Anxiety Depression CODE STROKE CVA    GI/Hepatic PUD, GERD  ,  Endo/Other    Renal/GU      Musculoskeletal  (+) Arthritis ,   Abdominal   Peds  Hematology   Anesthesia Other Findings   Reproductive/Obstetrics                             Anesthesia Physical Anesthesia Plan  ASA: 4 and emergent  Anesthesia Plan: General   Post-op Pain Management:    Induction: Intravenous  PONV Risk Score and Plan: 3 and Ondansetron, Dexamethasone and Treatment may vary due to age or medical condition  Airway Management Planned: Oral ETT  Additional Equipment: Arterial line  Intra-op Plan:   Post-operative Plan: Extubation in OR  Informed Consent: I have reviewed the patients History and Physical, chart, labs and discussed the procedure including the risks, benefits and alternatives for the proposed anesthesia with the patient or authorized representative who has indicated his/her understanding and acceptance.     Dental advisory given  Plan Discussed with: CRNA, Anesthesiologist and Surgeon  Anesthesia Plan Comments:         Anesthesia Quick Evaluation

## 2022-01-06 NOTE — ED Notes (Signed)
Pt transported to IR 

## 2022-01-06 NOTE — Anesthesia Procedure Notes (Signed)
Procedure Name: Intubation Date/Time: 01/06/2022 9:25 AM  Performed by: Georgia Duff, CRNAPre-anesthesia Checklist: Patient identified, Emergency Drugs available, Suction available and Patient being monitored Patient Re-evaluated:Patient Re-evaluated prior to induction Oxygen Delivery Method: Circle System Utilized Preoxygenation: Pre-oxygenation with 100% oxygen Induction Type: IV induction Ventilation: Mask ventilation without difficulty Laryngoscope Size: Miller and 2 Grade View: Grade I Tube type: Oral Tube size: 7.0 mm Number of attempts: 1 Airway Equipment and Method: Stylet and Oral airway Placement Confirmation: ETT inserted through vocal cords under direct vision, positive ETCO2 and breath sounds checked- equal and bilateral Secured at: 22 cm Tube secured with: Tape Dental Injury: Teeth and Oropharynx as per pre-operative assessment

## 2022-01-06 NOTE — ED Notes (Signed)
RN and stroke response RN was at bedside and pts O@ dropped to 79%, RN placed pt on NRB. EDP at bedside. ODE IR called. Pt at 100% on NRB protecting airway.

## 2022-01-06 NOTE — ED Notes (Signed)
Called [facility] for Code IR @8 :05

## 2022-01-06 NOTE — Code Documentation (Signed)
Stroke Response Nurse Documentation Code Documentation  Cassandra Alexander is a 76 y.o. 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 On aspirin 81 mg daily. Code stroke was activated by EMS.   Patient from home where she was LKW at an unclear time but family noted she was heard "talking to the dogs in her normal way and used the bathroom this am around 0710." She was then found with AMS and sitting on the steps unsure of how to get down the stairs. Family called EMS. Pt initially was refusing to go to the hospital. When she arrived with EMS she was responding verbally very slowly and having trouble following commands. She was weak in all 4 extremities. When in ED room after scans, the RN noticed her looking to the left with some nystagmus. Pt desatted into the upper 70's-low 80's and was placed on a NRB.   Stroke team at the bedside on patient arrival. Labs drawn and patient cleared for CT by Dr. Francia Greaves. Patient to CT with team. NIHSS 12, see documentation for details and code stroke times. Patient with bilateral arm weakness, bilateral leg weakness, and dysarthria  on exam. The following imaging was completed:  CT Head, CTA, and CTP. Patient is not a candidate for IV Thrombolytic due to unclear LKW that would put her outside the window. Patient is a candidate for IR due to "CTA head and neck with Perf with Occlusion of the non dominant left vertebral artery in the V4 segment and basilar tip embolus". Code IR was activated and pt was taken into the suite. Pt had a small episode of vomiting with mostly clear emesis after arriving to IR.  Pt's husband speaking with MD and signing consent. Belongings placed in a bag and taken to IR with pt. Bag included diamond necklace, slippers, pajamas, gum, and cell phone. Pt COVID swabbed by ED RN.   Care Plan: IR and admission to Neuro ICU. 4N  charge aware of bed need.    Bedside handoff with IR RN Elta Guadeloupe.   Chardai Gangemi, Rande Brunt  Stroke Response RN

## 2022-01-06 NOTE — Transfer of Care (Signed)
Immediate Anesthesia Transfer of Care Note  Patient: Cassandra Alexander  Procedure(s) Performed: IR WITH ANESTHESIA  Patient Location: ICU  Anesthesia Type:General  Level of Consciousness: Patient remains intubated per anesthesia plan  Airway & Oxygen Therapy: Patient remains intubated per anesthesia plan and Patient placed on Ventilator (see vital sign flow sheet for setting)  Post-op Assessment: Report given to RN and Post -op Vital signs reviewed and stable  Post vital signs: Reviewed and stable  Last Vitals:  Vitals Value Taken Time  BP 168/91 01/06/22 1137  Temp    Pulse    Resp 16 01/06/22 1144  SpO2    Vitals shown include unvalidated device data.  Last Pain: There were no vitals filed for this visit.       Complications: No notable events documented.

## 2022-01-06 NOTE — Procedures (Signed)
INR. Status post right vertebral arteriogram. Right CFA approach Findings. 1.  Occluded distal basilar artery. Status post revascularization of occluded distal basilar artery with 1 pass with a 4 mm x 40 mm s Solitaire X retriever device, and contact aspiration achieving aTICI 2C revascularization. Post  CT of the brain no evidence of intracranial hemorrhage. Patient left intubated due to her preprocedural medical condition. 8 French Angio-Seal closure device deployed for hemostasis at the right groin puncture site.  Distal pulses all intact unchanged.  Arlean Hopping MD .

## 2022-01-06 NOTE — Consult Note (Signed)
NAME:  Cassandra Alexander, MRN:  [id number], DOB:  [birth year], LOS: 0 ADMISSION DATE:  01/06/2022, CONSULTATION DATE:  01/06/22 REFERRING MD:  Milagros Loll, CHIEF COMPLAINT:  AMS   History of Present Illness:  76 year old woman with prior CVA, cerebral aneurysm post coiling, HLD, anxiety, depression presenting with confusion and global weakness.  Stat CTA head/neck showing left V4 occlusion and basilar tip embolus sent for mechanical thrombectomy.  Intubated for airway protection.  TICI 2c flow post, arrives to ICU intubated and sedated. PCCM consulted for vent management.  Pertinent  Medical History  HTN HLD Distant hx cerebral aneurysm clipping Arthritis TIA/CVA no residual deficits known  Significant Hospital Events: Including procedures, antibiotic start and stop dates in addition to other pertinent events   10.25 admit, mechanical thrombectomy, vent  Interim History / Subjective:  consulted  Objective   Blood pressure (!) 171/96, pulse 92, weight 51.2 kg, SpO2 100 %.    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 (CVA) s/p mechanical thrombectomy with TICI 2c flow  Post procedural ventilatory support  Abnormal CT chest  HLD, anxiety, depression  - ABG, CXR, vent bundle - Lung protective tidal volumes limiting driving pressures to < 15cm H2O as able - Sedation titrated  to vent compliance and patient comfort using PAD orderset - VAP prevention bundle - Daily SAT/SBT when meets institutional criteria - SBP goal 120-140 - Repeat imaging per neuro-IR: MRI tomorrow - Would get CT chest prior to DC to assure nodular consolidation has resolved - Plan discussed with RN and RT at bedside  Best Practice (right click and "Reselect all SmartList Selections" daily)   Diet/type: NPO DVT prophylaxis: per primary GI prophylaxis: PPI Lines: N/A Foley:  N/A Code Status:  full code Last date of multidisciplinary goals of care discussion [Per primary]  Labs   CBC: Recent Labs  Lab 01/06/22 0818 01/06/22 0823  WBC 6.9  --   NEUTROABS 3.6  --   HGB 12.9 13.3  HCT 40.3 39.0  MCV 96.2  --   PLT 204  --     Basic Metabolic Panel: Recent Labs  Lab 01/06/22 0818 01/06/22 0823  NA 139 141  K 4.0 4.1  CL 103 103  CO2 27  --   GLUCOSE 156* 149*  BUN 10 11  CREATININE 0.66 0.60  CALCIUM 8.8*  --    GFR: Estimated Creatinine Clearance: 44.3 mL/min (by C-G formula based on SCr of 0.6 mg/dL). Recent Labs  Lab 01/06/22 0818  WBC 6.9    Liver Function Tests: Recent Labs  Lab 01/06/22 0818  AST 19  ALT 21  ALKPHOS 53  BILITOT 0.8  PROT 6.1*  ALBUMIN 3.7   No results for input(s): "LIPASE", "AMYLASE" in the last 168 hours. No results for input(s): "AMMONIA" in the last 168 hours.  ABG  Component Value Date/Time   TCO2 27 01/06/2022 0823     Coagulation Profile: Recent Labs  Lab 01/06/22 0818  INR 1.0    Cardiac Enzymes: No results for input(s): "CKTOTAL", "CKMB", "CKMBINDEX", "TROPONINI" in the last 168 hours.  HbA1C: Hgb A1c MFr Bld  Date/Time Value Ref Range Status  09/26/2021 02:54 AM 5.8 (H) 4.8 - 5.6 % Final    Comment:    (NOTE) Pre diabetes:          5.7%-6.4%  Diabetes:              >6.4%  Glycemic control for   <7.0% adults with diabetes   07/08/2015 04:44 AM 6.1 (H) 4.8 - 5.6 % Final    Comment:    (NOTE)          Pre-diabetes: 5.7 - 6.4         Diabetes: >6.4         Glycemic control for adults with diabetes: <7.0     CBG: Recent Labs  Lab 01/06/22 0816  GLUCAP 158*    Review of Systems:   N/A intubated and sedated  Past Medical History:  She,  has a past medical history of Anxiety, Cerebral aneurysm (2006), Depression, Diverticula of colon, Headache (07/07/2015), Hyperlipidemia, IBS (irritable bowel syndrome), Left sided numbness (07/07/2015), Osteoarthritis, Primary localized osteoarthritis of left knee (05/16/2018), Primary localized osteoarthritis of left knee (05/16/2018), Pulmonary nodule 1 cm or greater in diameter (05/20/2021), 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   Surgical History:   Past Surgical History:  Procedure Laterality Date   ANEURYSM COILING  2006 @[facility]    right PCOM   CESAREAN SECTION  x2  last one  1970s   COLONOSCOPY     LUMBAR FUSION  2017   --  dr Trenton Gammon per pt   PARTIAL KNEE ARTHROPLASTY Left 05/16/2018   Procedure: UNICOMPARTMENTAL KNEE;  Surgeon: Marchia Bond, MD;  Location: [facility];  Service: Orthopedics;  Laterality: Left;   RECTAL PROLAPSE REPAIR  2004 approx.  @[facility]    TONSILLECTOMY AND ADENOIDECTOMY  age 74   TOTAL KNEE ARTHROPLASTY Right early 2000s   VAGINAL HYSTERECTOMY  eary age 6s   ovaries remain     Social History:   reports that she quit smoking about 31 years ago. Her smoking use included cigarettes. She has never used smokeless tobacco. She reports that she does not currently use alcohol. She reports that she does not use drugs.   Family History:  Her family history includes Hypertension in her mother; Osteoarthritis in her father; Stroke in her mother.   Allergies Allergies  Allergen Reactions   Cortisone Swelling    Facial swelling Headache   Penicillins Rash    Childhood reaction Has patient had a PCN reaction causing immediate rash, facial/tongue/throat  swelling, SOB or lightheadedness with hypotension: Yes Has patient had a PCN reaction causing severe rash involving mucus membranes or skin necrosis: No Has patient had a PCN reaction that required hospitalization No Has patient had a PCN reaction occurring within the last 10 years: No If all of the above answers are "NO", then may proceed with Cephalosporin use.      Home Medications  Prior to Admission medications   Medication Sig Start Date End Date Taking? Authorizing Provider  aspirin EC 81 MG tablet Take 1 tablet (81 mg total) by mouth daily. Swallow whole. Patient taking differently: Take 81 mg by mouth daily. 09/28/21  Yes Florene Glen,  A Clint Lipps., MD  atorvastatin (LIPITOR) 80 MG tablet Take 1 tablet (80 mg total) by mouth daily. 09/28/21 09/23/22 Yes Elodia Florence., MD  citalopram (CELEXA) 10 MG tablet Take 10 mg by mouth at bedtime.   Yes [provider]  HYDROcodone-acetaminophen (NORCO) 10-325 MG tablet Take 1 tablet by mouth every 4 (four) hours as needed for pain (max of 4 tablets a day) Patient taking differently: Take 1 tablet by mouth every 6 (six) hours as needed (pain). 12/22/21  Yes   imipramine (TOFRANIL) 50 MG tablet Take 2 tablets by mouth at bedtime.   Yes [provider]  lisinopril (ZESTRIL) 10 MG tablet Take 10 mg by mouth daily. 09/03/21  Yes [provider]  mesalamine (LIALDA) 1.2 g EC tablet Take 2.4 g by mouth daily with breakfast. 04/23/21  Yes [provider]  pantoprazole (PROTONIX) 40 MG tablet Take 40 mg by mouth every morning. 04/10/21  Yes [provider]     Critical care time: 33 minutes

## 2022-01-06 NOTE — ED Provider Notes (Signed)
[facility] NEURO/TRAUMA/SURGICAL ICU Provider Note   CSN: [id number] Arrival date & time: 01/06/22  4888  An emergency department physician performed an initial assessment on this suspected stroke patient at 0815.  History  Chief Complaint  Patient presents with   Code Stroke    Cassandra Alexander is a 76 y.o. female.  HPI Patient presents with confusion.  Came in as a code stroke.  Bed myself and Dr. Quinn Axe upon arrival to ER.  Last normal reportedly initially at 710, however with further research to appears was just heard talking in the other room by family member.  Last normal would go to the night before.  Had had ischemic stroke with small amount of petechial hemorrhage 3 months to 10 days ago.  Difficulty speaking.  Has had mental status decreased since called EMS.  EMS reportedly said patient had been sitting on the stairs and states that she was sick.  Then had increasing mental status.   Past Medical History:  Diagnosis Date   Anxiety    Cerebral aneurysm 2006   s/p  coiling right side  ("find during an MRI for headaches")   PCOM   Depression    Diverticula of colon    Headache 07/07/2015   Hyperlipidemia    IBS (irritable bowel syndrome)    Left sided numbness 07/07/2015   Osteoarthritis    knees, back, hands, hips   Primary localized osteoarthritis of left knee 05/16/2018   Primary localized osteoarthritis of left knee 05/16/2018   Pulmonary nodule 1 cm or greater in diameter 05/20/2021   S/P left unicompartmental knee replacement 05/16/2018   TIA (transient ischemic attack) 07/07/2015   Wears glasses     Home Medications Prior to Admission medications   Medication Sig Start Date End Date Taking? Authorizing Provider  aspirin EC 81 MG tablet Take 1 tablet (81 mg total) by mouth daily. Swallow whole. Patient taking differently: Take 81 mg by mouth daily. 09/28/21  Yes Elodia Florence., MD  atorvastatin (LIPITOR) 80 MG tablet Take 1 tablet (80 mg total) by mouth daily.  09/28/21 09/23/22 Yes Elodia Florence., MD  citalopram (CELEXA) 10 MG tablet Take 10 mg by mouth at bedtime.   Yes [provider]  HYDROcodone-acetaminophen (NORCO) 10-325 MG tablet Take 1 tablet by mouth every 4 (four) hours as needed for pain (max of 4 tablets a day) Patient taking differently: Take 1 tablet by mouth every 6 (six) hours as needed (pain). 12/22/21  Yes   imipramine (TOFRANIL) 50 MG tablet Take 2 tablets by mouth at bedtime.   Yes [provider]  lisinopril (ZESTRIL) 10 MG tablet Take 10 mg by mouth daily. 09/03/21  Yes [provider]  mesalamine (LIALDA) 1.2 g EC tablet Take 2.4 g by mouth daily with breakfast. 04/23/21  Yes [provider]  pantoprazole (PROTONIX) 40 MG tablet Take 40 mg by mouth every morning. 04/10/21  Yes [provider]      Allergies    Cortisone and Penicillins    Review of Systems   Review of Systems  Physical Exam Updated Vital Signs BP (!) 171/96   Pulse 92   Resp 16   Wt 51.2 kg   SpO2 100%   BMI 22.04 kg/m  Physical Exam Vitals and nursing note reviewed.  HENT:     Head: Atraumatic.  Cardiovascular:     Rate and Rhythm: Regular rhythm.  Pulmonary:     Breath sounds: No wheezing.  Neurological:  Comments: Will move all extremities but somewhat sedate.  Eye movements appear grossly intact at times does not appear to want to cross over to the right.  Will speak but somewhat somnolent.  Complete NIH scoring done by neurology.     ED Results / Procedures / Treatments   Labs (all labs ordered are listed, but only abnormal results are displayed) Labs Reviewed  APTT - Abnormal; Notable for the following components:      Result Value   aPTT 23 (*)    All other components within normal limits  COMPREHENSIVE METABOLIC PANEL - Abnormal; Notable for the following components:   Glucose, Bld 156 (*)    Calcium 8.8 (*)    Total Protein 6.1 (*)    All other components within normal  limits  I-STAT CHEM 8, ED - Abnormal; Notable for the following components:   Glucose, Bld 149 (*)    Calcium, Ion 1.12 (*)    All other components within normal limits  CBG MONITORING, ED - Abnormal; Notable for the following components:   Glucose-Capillary 158 (*)    All other components within normal limits  SARS CORONAVIRUS 2 BY RT PCR  PROTIME-INR  CBC  DIFFERENTIAL  ETHANOL  URINALYSIS, ROUTINE W REFLEX MICROSCOPIC  RAPID URINE DRUG SCREEN, HOSP PERFORMED    EKG None  Radiology DG Chest Portable 1 View  Result Date: 01/06/2022 CLINICAL DATA:  Provided history: Altered mental status. Code stroke. EXAM: PORTABLE CHEST 1 VIEW COMPARISON:  Chest radiographs 02/28/2020 and earlier. Chest CT 05/01/2021. FINDINGS: A loop recorder device overlies the left chest. Heart size within normal limits. Aortic atherosclerosis. Mild atelectasis within the right lung base. The 1.4 cm focus of nodular airspace consolidation demonstrated within the left upper lobe on the prior chest CT of 05/01/2021 is occult on the current exam. Mild chronic elevation of the right hemidiaphragm. No evidence of pleural effusion or pneumothorax. No acute bony abnormality identified. Levocurvature of the thoracic spine. Thoracic spondylosis. IMPRESSION: Mild atelectasis within the right lung base. The 1.4 cm focus of nodular airspace consolidation demonstrated within the left upper lobe on the prior chest CT of 05/01/2021 is occult on the current examination. Please refer to this prior examination for further description and for follow-up recommendations. Aortic Atherosclerosis (ICD10-I70.0). Electronically Signed   By: Kellie Simmering D.O.   On: 01/06/2022 09:16   CT ANGIO HEAD NECK W WO CM W PERF (CODE STROKE)  Result Date: 01/06/2022 CLINICAL DATA:  Neuro deficit, acute, stroke suspected. EXAM: CT ANGIOGRAPHY HEAD AND NECK CT PERFUSION BRAIN TECHNIQUE: Multidetector CT imaging of the head and neck was performed using the  standard protocol during bolus administration of intravenous contrast. Multiplanar CT image reconstructions and MIPs were obtained to evaluate the vascular anatomy. Carotid stenosis measurements (when applicable) are obtained utilizing NASCET criteria, using the distal internal carotid diameter as the denominator. Multiphase CT imaging of the brain was performed following IV bolus contrast injection. Subsequent parametric perfusion maps were calculated using RAPID software. RADIATION DOSE REDUCTION: This exam was performed according to the departmental dose-optimization program which includes automated exposure control, adjustment of the mA and/or kV according to patient size and/or use of iterative reconstruction technique. CONTRAST:  138m OMNIPAQUE IOHEXOL 350 MG/ML SOLN COMPARISON:  Head CT same day. MRI 09/25/2021. CT angiography 09/25/2021. FINDINGS: CTA NECK FINDINGS Aortic arch: Aortic atherosclerosis. Innominate artery origin not included on the scan. Small left vertebral artery arises directly from the arch Right carotid system: Common carotid artery widely  patent to the bifurcation. Carotid bifurcation is normal without soft or calcified plaque. Cervical ICA is normal. Left carotid system: Common carotid artery widely patent to the bifurcation. Very minimal plaque at the bifurcation but no stenosis or irregularity. Cervical ICA widely patent. Vertebral arteries: Dominant right vertebral artery arises normally from the subclavian artery and is widely patent through the cervical region to the foramen magnum. As noted above, left vertebral artery is a tiny vessel that arises from the arch in actually has a second origin at the left subclavian artery, the 2 branches coming together in the transverse foramen of the lower cervical spine. Beyond that, the vessel is patent to the foramen magnum. Skeleton: Scoliosis without significant degenerative change. Other neck: No mass or lymphadenopathy. Upper chest:  Emphysema without acute process. Review of the MIP images confirms the above findings CTA HEAD FINDINGS Anterior circulation: Right internal carotid artery widely patent through the skull base and siphon region. 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 Limited evaluation because of dense streak artifact the anterior and middle cerebral vessels are patent. No large vessel occlusion or proximal stenosis. Left internal carotid arteries widely patent through the skull base and siphon region. The anterior and middle cerebral vessels are normal without aneurysm or stenosis. Posterior circulation: Dominant right vertebral artery is widely patent to the basilar artery. Right PICA shows flow. Non dominant left vertebral artery is occluded in the V4 segment. The basilar artery shows flow within both anterior inferior cerebellar arteries. There is an embolus at the basilar tip. Flow is seen within both posterior cerebral arteries. Venous sinuses: Patent and normal Anatomic variants: None significant otherwise Review of the MIP images confirms the above findings CT Brain Perfusion Findings: ASPECTS: 10 CBF (<30%) Volume: 38m Perfusion (Tmax>6.0s) volume: 013mMismatch Volume: 35m935mnfarction Location:None demonstrated IMPRESSION: 1. 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 Basilar artery shows flow proximally but there is an embolus at the basilar tip. Some flow is seen within both posterior cerebral arteries. 2. 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 Limited evaluation because of dense streak artifact. No anterior circulation large vessel occlusion or proximal stenosis. 3. No carotid bifurcation disease. 4. No evidence of infarction by CT perfusion imaging. 5. Aortic atherosclerosis. 6. Emphysema without acute process. 7. The left vertebral artery has 2 origins, 1 arising directly from the aortic arch and a second origin at the left subclavian  artery, the 2 branches coming together in the transverse foramen of the lower cervical spine. Beyond that, the vessel is patent to the foramen magnum. Left V4 occlusion described above. Aortic Atherosclerosis (ICD10-I70.0) and Emphysema (ICD10-J43.9). Case discussed with Dr. StaQuinn Axering performance. Basilar tip embolic occlusion communicated at approximally 0905 hours via Amion. Electronically Signed   By: MarNelson ChimesD.   On: 01/06/2022 09:07   CT HEAD CODE STROKE WO CONTRAST  Result Date: 01/06/2022 CLINICAL DATA:  Code stroke.  Neuro deficit, acute, stroke suspected EXAM: CT HEAD WITHOUT CONTRAST TECHNIQUE: Contiguous axial images were obtained from the base of the skull through the vertex without intravenous contrast. RADIATION DOSE REDUCTION: This exam was performed according to the departmental dose-optimization program which includes automated exposure control, adjustment of the mA and/or kV according to patient size and/or use of iterative reconstruction technique. COMPARISON:  CT 09/25/2021. FINDINGS: Brain: No evidence of acute large vascular territory infarction, hemorrhage, hydrocephalus, extra-axial collection or mass lesion/mass effect. Similar small scattered remote infarcts. Vascular:  Embolization coils in the right paraclinoid ICA. Skull: No acute fracture. Sinuses/Orbits: No acute finding. Other: No mastoid effusions. ASPECTS [facility] Stroke Program Early CT Score) total score (0-10 with 10 being normal): 10. IMPRESSION: 1. No evidence of acute intracranial abnormality. 2. ASPECTS is 10. Code stroke imaging results were communicated on 01/06/2022 at 8:30 am to provider [facility] via secure text paging. Electronically Signed   By: Margaretha Sheffield M.D.   On: 01/06/2022 08:30   CUP PACEART REMOTE DEVICE CHECK  Result Date: 01/05/2022 ILR summary report received. Battery status OK. Normal device function. No new symptom, tachy, brady, or pause episodes. No new AF episodes. Monthly summary  reports and ROV/PRN LA   Procedures Procedures    Medications Ordered in ED Medications  sodium chloride flush (NS) 0.9 % injection 3 mL (has no administration in time range)  vancomycin (VANCOCIN) 1-5 GM/200ML-% IVPB (has no administration in time range)  nitroGLYCERIN 100 mcg/mL intra-arterial injection (has no administration in time range)   stroke: early stages of recovery book (has no administration in time range)  0.9 %  sodium chloride infusion ( Intravenous Duplicate 83/41/96 2229)  senna-docusate (Senokot-S) tablet 1 tablet (has no administration in time range)  pantoprazole (PROTONIX) injection 40 mg (has no administration in time range)  acetaminophen (TYLENOL) tablet 650 mg (has no administration in time range)    Or  acetaminophen (TYLENOL) 160 MG/5ML solution 650 mg (has no administration in time range)    Or  acetaminophen (TYLENOL) suppository 650 mg (has no administration in time range)  0.9 %  sodium chloride infusion ( Intravenous New Bag/Given 01/06/22 1144)  clevidipine (CLEVIPREX) infusion 0.5 mg/mL (2 mg/hr Intravenous New Bag/Given 01/06/22 1156)  docusate (COLACE) 50 MG/5ML liquid 100 mg (has no administration in time range)  polyethylene glycol (MIRALAX / GLYCOLAX) packet 17 g (has no administration in time range)  propofol (DIPRIVAN) 1000 MG/100ML infusion (has no administration in time range)  fentaNYL (SUBLIMAZE) injection 25 mcg (has no administration in time range)  fentaNYL (SUBLIMAZE) injection 25-100 mcg (has no administration in time range)  insulin aspart (novoLOG) injection 0-9 Units (has no administration in time range)  iohexol (OMNIPAQUE) 350 MG/ML injection 100 mL (100 mLs Intravenous Contrast Given 01/06/22 0827)  iohexol (OMNIPAQUE) 350 MG/ML injection 100 mL (20 mLs Intravenous Contrast Given 01/06/22 1125)  iohexol (OMNIPAQUE) 300 MG/ML solution 150 mL (70 mLs Intra-arterial Contrast Given 01/06/22 1124)    ED Course/ Medical Decision  Making/ A&P                           Medical Decision Making Amount and/or Complexity of Data Reviewed Labs: ordered. Radiology: ordered.  Risk Decision regarding hospitalization.   Patient brought in as a code stroke.  Mental status change.  Reassuring sugar.  Differential diagnosis along with includes condition such as stroke, encephalopathy. initial head CT reassuring.  Initial blood work reassuring.  However CTA showed potential basilar occlusion.  Decreasing mental status.  Sats had dropped.  Is arousable but with condition change called a code IR by neurology and will be taken emergently for neuro intervention.  Not a TNK candidate due to last normal last night.  CRITICAL CARE Performed by: Davonna Belling Total critical care time: 30 minutes Critical care time was exclusive of separately billable procedures and treating other patients. Critical care was necessary to treat or prevent imminent or life-threatening deterioration. Critical care was time spent personally by me on the  following activities: development of treatment plan with patient and/or surrogate as well as nursing, discussions with consultants, evaluation of patient's response to treatment, examination of patient, obtaining history from patient or surrogate, ordering and performing treatments and interventions, ordering and review of laboratory studies, ordering and review of radiographic studies, pulse oximetry and re-evaluation of patient's condition.         Final Clinical Impression(s) / ED Diagnoses Final diagnoses:  Acute ischemic stroke [facility])    Rx / DC Orders ED Discharge Orders     None         Davonna Belling, MD 01/06/22 1202

## 2022-01-07 ENCOUNTER — Inpatient Hospital Stay (HOSPITAL_COMMUNITY): Payer: PPO

## 2022-01-07 ENCOUNTER — Encounter (HOSPITAL_COMMUNITY): Payer: Self-pay | Admitting: Radiology

## 2022-01-07 DIAGNOSIS — I6389 Other cerebral infarction: Secondary | ICD-10-CM | POA: Diagnosis not present

## 2022-01-07 DIAGNOSIS — R0689 Other abnormalities of breathing: Secondary | ICD-10-CM

## 2022-01-07 DIAGNOSIS — I651 Occlusion and stenosis of basilar artery: Secondary | ICD-10-CM | POA: Diagnosis not present

## 2022-01-07 LAB — COMPREHENSIVE METABOLIC PANEL
ALT: 17 U/L (ref 0–44)
AST: 19 U/L (ref 15–41)
Albumin: 3.4 g/dL — ABNORMAL LOW (ref 3.5–5.0)
Alkaline Phosphatase: 51 U/L (ref 38–126)
Anion gap: 10 (ref 5–15)
BUN: 6 mg/dL — ABNORMAL LOW (ref 8–23)
CO2: 25 mmol/L (ref 22–32)
Calcium: 8.7 mg/dL — ABNORMAL LOW (ref 8.9–10.3)
Chloride: 105 mmol/L (ref 98–111)
Creatinine, Ser: 0.53 mg/dL (ref 0.44–1.00)
GFR, Estimated: >60 mL/min (ref >60)
Glucose, Bld: 97 mg/dL (ref 70–99)
Potassium: 3.5 mmol/L (ref 3.5–5.1)
Sodium: 140 mmol/L (ref 135–145)
Total Bilirubin: 0.6 mg/dL (ref 0.3–1.2)
Total Protein: 5.7 g/dL — ABNORMAL LOW (ref 6.5–8.1)

## 2022-01-07 LAB — ECHOCARDIOGRAM COMPLETE
AR max vel: 2.64 cm2
AV Area VTI: 2.67 cm2
AV Area mean vel: 2.48 cm2
AV Mean grad: 2 mmHg
AV Peak grad: 4.5 mmHg
Ao pk vel: 1.06 m/s
Area-P 1/2: 3.65 cm2
Height: 60 in
S' Lateral: 3 cm
Weight: 1806.01 oz

## 2022-01-07 LAB — GLUCOSE, CAPILLARY
Glucose-Capillary: 128 mg/dL — ABNORMAL HIGH (ref 70–99)
Glucose-Capillary: 66 mg/dL — ABNORMAL LOW (ref 70–99)
Glucose-Capillary: 68 mg/dL — ABNORMAL LOW (ref 70–99)
Glucose-Capillary: 85 mg/dL (ref 70–99)
Glucose-Capillary: 89 mg/dL (ref 70–99)
Glucose-Capillary: 89 mg/dL (ref 70–99)
Glucose-Capillary: 94 mg/dL (ref 70–99)

## 2022-01-07 LAB — CBC WITH DIFFERENTIAL/PLATELET
Abs Immature Granulocytes: 0.03 10*3/uL (ref 0.00–0.07)
Basophils Absolute: 0 10*3/uL (ref 0.0–0.1)
Basophils Relative: 0 %
Eosinophils Absolute: 0 10*3/uL (ref 0.0–0.5)
Eosinophils Relative: 0 %
HCT: 35.8 % — ABNORMAL LOW (ref 36.0–46.0)
Hemoglobin: 12.2 g/dL (ref 12.0–15.0)
Immature Granulocytes: 0 %
Lymphocytes Relative: 19 %
Lymphs Abs: 1.9 10*3/uL (ref 0.7–4.0)
MCH: 31.5 pg (ref 26.0–34.0)
MCHC: 34.1 g/dL (ref 30.0–36.0)
MCV: 92.5 fL (ref 80.0–100.0)
Monocytes Absolute: 1.1 10*3/uL — ABNORMAL HIGH (ref 0.1–1.0)
Monocytes Relative: 11 %
Neutro Abs: 7.2 10*3/uL (ref 1.7–7.7)
Neutrophils Relative %: 70 %
Platelets: 234 10*3/uL (ref 150–400)
RBC: 3.87 MIL/uL (ref 3.87–5.11)
RDW: 13.8 % (ref 11.5–15.5)
WBC: 10.3 10*3/uL (ref 4.0–10.5)
nRBC: 0 % (ref 0.0–0.2)

## 2022-01-07 LAB — HEMOGLOBIN A1C
Hgb A1c MFr Bld: 6 % — ABNORMAL HIGH (ref 4.8–5.6)
Mean Plasma Glucose: 125.5 mg/dL

## 2022-01-07 LAB — LIPID PANEL
Cholesterol: 124 mg/dL (ref 0–200)
HDL: 62 mg/dL (ref >40)
LDL Cholesterol: 47 mg/dL (ref 0–99)
Total CHOL/HDL Ratio: 2 RATIO
Triglycerides: 77 mg/dL (ref <150)
VLDL: 15 mg/dL (ref 0–40)

## 2022-01-07 LAB — TRIGLYCERIDES: Triglycerides: 87 mg/dL (ref <150)

## 2022-01-07 MED ORDER — POLYETHYLENE GLYCOL 3350 17 G PO PACK: 17.0000 g | PACK | Freq: Every day | ORAL | Status: DC

## 2022-01-07 MED ORDER — DEXTROSE 50 % IV SOLN
12.5000 g | INTRAVENOUS | Status: AC
  Administered 2022-01-07: 12.5 g via INTRAVENOUS

## 2022-01-07 MED ORDER — ACETAMINOPHEN 325 MG PO TABS
650.0000 mg | ORAL_TABLET | ORAL | Status: DC | PRN
  Administered 2022-01-07: 650 mg via ORAL
  Filled 2022-01-07: qty 2

## 2022-01-07 MED ORDER — POTASSIUM CHLORIDE 20 MEQ PO PACK
40.0000 meq | PACK | Freq: Once | ORAL | Status: AC
  Administered 2022-01-07: 40 meq
  Filled 2022-01-07: qty 2

## 2022-01-07 MED ORDER — LABETALOL HCL 5 MG/ML IV SOLN
20.0000 mg | INTRAVENOUS | Status: DC | PRN
  Filled 2022-01-07: qty 4

## 2022-01-07 MED ORDER — ASPIRIN 81 MG PO CHEW
81.0000 mg | CHEWABLE_TABLET | Freq: Every day | ORAL | Status: DC
  Administered 2022-01-08: 81 mg via ORAL
  Filled 2022-01-07: qty 1

## 2022-01-07 MED ORDER — FENTANYL 2500MCG IN NS 250ML (10MCG/ML) PREMIX INFUSION
0.0000 ug/h | INTRAVENOUS | Status: DC
  Administered 2022-01-07: 25 ug/h via INTRAVENOUS
  Filled 2022-01-07: qty 250

## 2022-01-07 MED ORDER — DEXTROSE 50 % IV SOLN
INTRAVENOUS | Status: AC
  Filled 2022-01-07: qty 50

## 2022-01-07 MED ORDER — CLOPIDOGREL BISULFATE 75 MG PO TABS
75.0000 mg | ORAL_TABLET | Freq: Every day | ORAL | Status: DC
  Administered 2022-01-08: 75 mg via ORAL
  Filled 2022-01-07: qty 1

## 2022-01-07 MED ORDER — ACETAMINOPHEN 650 MG RE SUPP: 650.0000 mg | RECTAL | Status: DC | PRN

## 2022-01-07 MED ORDER — ACETAMINOPHEN 160 MG/5ML PO SOLN: 650.0000 mg | ORAL | Status: DC | PRN

## 2022-01-07 MED ORDER — DOCUSATE SODIUM 100 MG PO CAPS
100.0000 mg | ORAL_CAPSULE | Freq: Two times a day (BID) | ORAL | Status: DC
  Administered 2022-01-07 – 2022-01-08 (×2): 100 mg via ORAL
  Filled 2022-01-07 (×2): qty 1

## 2022-01-07 NOTE — Progress Notes (Signed)
New SBP goal <200 per Dr Leonie Man.

## 2022-01-07 NOTE — Progress Notes (Signed)
PCCM Interval Progress Note  MRI completed. Sedation being weaned. Pt awake, getting a bit agitated, hinting she wants ETT removed. Follows all commands and vitals stable.  Communicated with RN to d/c sedation and coordinate with RT (also discussed) on extubation.   Montey Hora, [place] [facility] For pager details, please see AMION or use Epic chat  After 1900, please call [facility] for cross coverage needs 01/07/2022, 11:32 AM

## 2022-01-07 NOTE — Progress Notes (Signed)
[place] Progress Note Patient Name: BAYLEY YARBOROUGH DOB: Jan 23, 1946 MRN: [id number]   Date of Service  01/07/2022  HPI/Events of Note  Agitation - Patient on maximal dose of a Propofol IV infusion and still requiring Fentanyl 100 mcg IV Q 1 hour. Nursing requesting a Fentanyl IV infusion.  eICU Interventions  Plan: Fentanyl IV infusion (0-200 mcg/hour). Titrate to RASS = 0 to -1.     Intervention Category Major Interventions: Delirium, psychosis, severe agitation - evaluation and management  Shital Crayton Eugene 01/07/2022, 3:12 AM

## 2022-01-07 NOTE — Progress Notes (Addendum)
STROKE TEAM PROGRESS NOTE   INTERVAL HISTORY Her family is at the bedside.  MRI planned for 9am.  Patient presented yesterday with altered mental status upon awakening from sleep with NIH of 12 and CT angiogram showing occlusion of the nondominant left vertebral artery in the V4 segment as well as a near occlusive embolus on the basilar tip and was taken for emergent mechanical thrombectomy which was performed successfully.  She remains intubated but is easily arousable and follows commands and is able to move all 4 extremities well against gravity.  Blood pressure adequately controlled  Vitals:   01/07/22 0615 01/07/22 0630 01/07/22 0645 01/07/22 0700  BP: 106/60 135/67 111/70 135/66  Pulse: 82 82 83 83  Resp: 16 16 16 17   Temp:      TempSrc:      SpO2: 98% 98% 98% 99%  Weight:      Height:       CBC:  Recent Labs  Lab 01/06/22 0818 01/06/22 0823 01/06/22 1220 01/07/22 0428  WBC 6.9  --   --  10.3  NEUTROABS 3.6  --   --  7.2  HGB 12.9   < > 12.9 12.2  HCT 40.3   < > 38.0 35.8*  MCV 96.2  --   --  92.5  PLT 204  --   --  234   < > = values in this interval not displayed.   Basic Metabolic Panel:  Recent Labs  Lab 01/06/22 0818 01/06/22 0823 01/06/22 1220 01/07/22 0428  NA 139 141 137 140  K 4.0 4.1 3.5 3.5  CL 103 103  --  105  CO2 27  --   --  25  GLUCOSE 156* 149*  --  97  BUN 10 11  --  6*  CREATININE 0.66 0.60  --  0.53  CALCIUM 8.8*  --   --  8.7*   Lipid Panel:  Recent Labs  Lab 01/07/22 0527  CHOL 124  TRIG 77  HDL 62  CHOLHDL 2.0  VLDL 15  LDLCALC 47   HgbA1c:  Recent Labs  Lab 01/07/22 0428  HGBA1C 6.0*   Urine Drug Screen:  Recent Labs  Lab 01/06/22 1749  LABOPIA POSITIVE*  COCAINSCRNUR NONE DETECTED  LABBENZ POSITIVE*  AMPHETMU NONE DETECTED  THCU NONE DETECTED  LABBARB NONE DETECTED    Alcohol Level  Recent Labs  Lab 01/06/22 0818  ETH <10    IMAGING past 24 hours DG Abd Portable 1V  Result Date: 01/06/2022 CLINICAL  DATA:  Enteric catheter placement EXAM: PORTABLE ABDOMEN - 1 VIEW COMPARISON:  None Available. FINDINGS: Frontal view of the lower chest and upper abdomen demonstrates enteric catheter passing below diaphragm tip and side port projecting over the gastric body. Lung bases are clear. Excreted contrast within the kidneys. Bowel gas pattern is unremarkable. IMPRESSION: 1. Enteric catheter tip and side port projecting over gastric body. Electronically Signed   By: Randa Ngo M.D.   On: 01/06/2022 18:19   DG Chest Portable 1 View  Result Date: 01/06/2022 CLINICAL DATA:  Provided history: Altered mental status. Code stroke. EXAM: PORTABLE CHEST 1 VIEW COMPARISON:  Chest radiographs 02/28/2020 and earlier. Chest CT 05/01/2021. FINDINGS: A loop recorder device overlies the left chest. Heart size within normal limits. Aortic atherosclerosis. Mild atelectasis within the right lung base. The 1.4 cm focus of nodular airspace consolidation demonstrated within the left upper lobe on the prior chest CT of 05/01/2021 is occult on the current exam.  Mild chronic elevation of the right hemidiaphragm. No evidence of pleural effusion or pneumothorax. No acute bony abnormality identified. Levocurvature of the thoracic spine. Thoracic spondylosis. IMPRESSION: Mild atelectasis within the right lung base. The 1.4 cm focus of nodular airspace consolidation demonstrated within the left upper lobe on the prior chest CT of 05/01/2021 is occult on the current examination. Please refer to this prior examination for further description and for follow-up recommendations. Aortic Atherosclerosis (ICD10-I70.0). Electronically Signed   By: Kellie Simmering D.O.   On: 01/06/2022 09:16   CT ANGIO HEAD NECK W WO CM W PERF (CODE STROKE)  Result Date: 01/06/2022 CLINICAL DATA:  Neuro deficit, acute, stroke suspected. EXAM: CT ANGIOGRAPHY HEAD AND NECK CT PERFUSION BRAIN TECHNIQUE: Multidetector CT imaging of the head and neck was performed using  the standard protocol during bolus administration of intravenous contrast. Multiplanar CT image reconstructions and MIPs were obtained to evaluate the vascular anatomy. Carotid stenosis measurements (when applicable) are obtained utilizing NASCET criteria, using the distal internal carotid diameter as the denominator. Multiphase CT imaging of the brain was performed following IV bolus contrast injection. Subsequent parametric perfusion maps were calculated using RAPID software. RADIATION DOSE REDUCTION: This exam was performed according to the departmental dose-optimization program which includes automated exposure control, adjustment of the mA and/or kV according to patient size and/or use of iterative reconstruction technique. CONTRAST:  145m OMNIPAQUE IOHEXOL 350 MG/ML SOLN COMPARISON:  Head CT same day. MRI 09/25/2021. CT angiography 09/25/2021. FINDINGS: CTA NECK FINDINGS Aortic arch: Aortic atherosclerosis. Innominate artery origin not included on the scan. Small left vertebral artery arises directly from the arch Right carotid system: Common carotid artery widely patent to the bifurcation. Carotid bifurcation is normal without soft or calcified plaque. Cervical ICA is normal. Left carotid system: Common carotid artery widely patent to the bifurcation. Very minimal plaque at the bifurcation but no stenosis or irregularity. Cervical ICA widely patent. Vertebral arteries: Dominant right vertebral artery arises normally from the subclavian artery and is widely patent through the cervical region to the foramen magnum. As noted above, left vertebral artery is a tiny vessel that arises from the arch in actually has a second origin at the left subclavian artery, the 2 branches coming together in the transverse foramen of the lower cervical spine. Beyond that, the vessel is patent to the foramen magnum. Skeleton: Scoliosis without significant degenerative change. Other neck: No mass or lymphadenopathy. Upper chest:  Emphysema without acute process. Review of the MIP images confirms the above findings CTA HEAD FINDINGS Anterior circulation: Right internal carotid artery widely patent through the skull base and siphon region. 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 Limited evaluation because of dense streak artifact the anterior and middle cerebral vessels are patent. No large vessel occlusion or proximal stenosis. Left internal carotid arteries widely patent through the skull base and siphon region. The anterior and middle cerebral vessels are normal without aneurysm or stenosis. Posterior circulation: Dominant right vertebral artery is widely patent to the basilar artery. Right PICA shows flow. Non dominant left vertebral artery is occluded in the V4 segment. The basilar artery shows flow within both anterior inferior cerebellar arteries. There is an embolus at the basilar tip. Flow is seen within both posterior cerebral arteries. Venous sinuses: Patent and normal Anatomic variants: None significant otherwise Review of the MIP images confirms the above findings CT Brain Perfusion Findings: ASPECTS: 10 CBF (<30%) Volume: 09mPerfusion (Tmax>6.0s) volume: 29m9mismatch Volume: 29mL229mfarction Location:None demonstrated IMPRESSION:  1. 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 Basilar artery shows flow proximally but there is an embolus at the basilar tip. Some flow is seen within both posterior cerebral arteries. 2. 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 Limited evaluation because of dense streak artifact. No anterior circulation large vessel occlusion or proximal stenosis. 3. No carotid bifurcation disease. 4. No evidence of infarction by CT perfusion imaging. 5. Aortic atherosclerosis. 6. Emphysema without acute process. 7. The left vertebral artery has 2 origins, 1 arising directly from the aortic arch and a second origin at the left subclavian  artery, the 2 branches coming together in the transverse foramen of the lower cervical spine. Beyond that, the vessel is patent to the foramen magnum. Left V4 occlusion described above. Aortic Atherosclerosis (ICD10-I70.0) and Emphysema (ICD10-J43.9). Case discussed with Dr. Quinn Axe during performance. Basilar tip embolic occlusion communicated at approximally 0905 hours via Amion. Electronically Signed   By: Nelson Chimes M.D.   On: 01/06/2022 09:07   CT HEAD CODE STROKE WO CONTRAST  Result Date: 01/06/2022 CLINICAL DATA:  Code stroke.  Neuro deficit, acute, stroke suspected EXAM: CT HEAD WITHOUT CONTRAST TECHNIQUE: Contiguous axial images were obtained from the base of the skull through the vertex without intravenous contrast. RADIATION DOSE REDUCTION: This exam was performed according to the departmental dose-optimization program which includes automated exposure control, adjustment of the mA and/or kV according to patient size and/or use of iterative reconstruction technique. COMPARISON:  CT 09/25/2021. FINDINGS: Brain: No evidence of acute large vascular territory infarction, hemorrhage, hydrocephalus, extra-axial collection or mass lesion/mass effect. Similar small scattered remote infarcts. Vascular: Embolization coils in the right paraclinoid ICA. Skull: No acute fracture. Sinuses/Orbits: No acute finding. Other: No mastoid effusions. ASPECTS [facility] Stroke Program Early CT Score) total score (0-10 with 10 being normal): 10. IMPRESSION: 1. No evidence of acute intracranial abnormality. 2. ASPECTS is 10. Code stroke imaging results were communicated on 01/06/2022 at 8:30 am to provider [facility] via secure text paging. Electronically Signed   By: Margaretha Sheffield M.D.   On: 01/06/2022 08:30    PHYSICAL EXAM  Physical Exam  Constitutional: Frail elderly Caucasian lady appears well-developed and well-nourished.   Cardiovascular: Normal rate and regular rhythm.  Respiratory: Effort normal, non-labored  breathing  Neuro: Mental Status: Intubated, sedated but arouses easily and follows all commands well Cranial Nerves: II: Visual Fields are full. PERRL III,IV, VI: EOMI without ptosis or diploplia.  Saccadic dysmetria on lateral gaze bilaterally.  No nystagmus. VIII: Hearing is intact to voice X: Cough and gag intact XI: Shoulder shrug is symmetric. XII: Tongue protrudes midline without atrophy or fasciculations.  Motor: Tone is normal. Bulk is normal. Moves all extremities antigravity. No drift noted but possible trace weakness on the left Sensory: Sensation is symmetric to light touch and temperature in the arms and legs. No extinction to DSS present.  Cerebellar: FNF and HKS are intact bilaterally        ASSESSMENT/PLAN Ms. Cassandra Alexander is a 76 y.o. 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 ASPECTS 10.    CTA head & neck Basilar artery shows flow proximally but there is an  embolus at the basilar tip.  Post IR CT No evidence of ICH MRI  Acute infarcts in the bilateral cerebellar hemispheres and pons 2D Echo Pending LDL 47 HgbA1c 6.0 VTE prophylaxis -SCDs    Diet   Diet NPO time specified   aspirin 81 mg daily prior to admission, now on aspirin 81 mg daily and clopidogrel 75 mg daily. when able to swallow Therapy recommendations:  pending Disposition:  pending  Hypoxic respiratory failure CCM managing ventilator Plan to extubate after MRI  Hypertension Home meds:  Lisinopril Stable BP goal 120-140 for 24 hours post procedure Long-term BP goal normotensive Cleviprex   Hyperlipidemia Home meds:  Atorvastatin 23m, resumed  in hospital LDL 47, goal < 70 Continue statin at discharge  Other Stroke Risk Factors Advanced Age >/= 64 Former Cigarette smoker Hx stroke/TIA Hx cerebral aneurysm s/p coiled Hx stroke/TIA- 09/2021 Acute right insula to right posterior basal ganglia/corona radiata ischemic infarct with petechial blood likely due to embolus and chronic small vessel disease   Other Active Problems   Hospital day # 1  I have personally obtained history,examined this patient, reviewed notes, independently viewed imaging studies, participated in medical decision making and plan of care.ROS completed by me personally and pertinent positives fully documented  I have made any additions or clarifications directly to the above note.  Patient with recent history of cryptogenic right MCA branch infarct in July 2023 with loop recorder which was negative for paroxysmal A-fib so far.  She presented with altered mental status and was found to have basilar tip occlusion for which she underwent emergent mechanical thrombectomy successfully and is doing well.  Continue close neurological observation and strict blood pressure control as per post thrombectomy protocol.  Check MRI scan of the brain today and extubate after that as tolerated per CCM.  Aspirin and Plavix for 3 months followed by Plavix alone when able to swallow.  Aggressive risk factor modifications.  Interrogate loop recorder for paroxysmal A-fib.  Long discussion with patient and with Dr. MVerlee Montecritical care MD and answered questions.This patient is critically ill and at significant risk of neurological worsening, death and care requires constant monitoring of vital signs, hemodynamics,respiratory and cardiac monitoring, extensive review of multiple databases, frequent neurological assessment, discussion with family, other specialists and medical decision making of high complexity.I have made any additions or clarifications directly to the above note.This critical care  time does not reflect procedure time, or teaching time or supervisory time of PA/NP/Med Resident etc but could involve care discussion time.  I spent 35 minutes of neurocritical care time  in the care of  this patient.      PAntony Contras MD Medical Director [facility] Pager: [phone]/26/2023 1:37 PM   To contact Stroke Continuity provider, please refer to [email or web address] After hours, contact General Neurology

## 2022-01-07 NOTE — Progress Notes (Signed)
Supervising Physician: Luanne Bras  Patient Status:  Cassandra Alexander - In-pt  Chief Complaint:  Code stroke s/p revascularization of occluded distal basilar artery with mechanical thrombectomy  Subjective:  Awake, following instructions, still intubated and has soft wrist restraints, asking to go to the restroom, denies headache  Allergies: Cortisone and Penicillins  Medications: Prior to Admission medications   Medication Sig Start Date End Date Taking? Authorizing Provider  aspirin EC 81 MG tablet Take 1 tablet (81 mg total) by mouth daily. Swallow whole. Patient taking differently: Take 81 mg by mouth daily. 09/28/21  Yes Elodia Florence., MD  atorvastatin (LIPITOR) 80 MG tablet Take 1 tablet (80 mg total) by mouth daily. 09/28/21 09/23/22 Yes Elodia Florence., MD  citalopram (CELEXA) 10 MG tablet Take 10 mg by mouth at bedtime.   Yes [provider]  HYDROcodone-acetaminophen (NORCO) 10-325 MG tablet Take 1 tablet by mouth every 4 (four) hours as needed for pain (max of 4 tablets a day) Patient taking differently: Take 1 tablet by mouth every 6 (six) hours as needed (pain). 12/22/21  Yes   imipramine (TOFRANIL) 50 MG tablet Take 2 tablets by mouth at bedtime.   Yes [provider]  lisinopril (ZESTRIL) 10 MG tablet Take 10 mg by mouth daily. 09/03/21  Yes [provider]  mesalamine (LIALDA) 1.2 g EC tablet Take 2.4 g by mouth daily with breakfast. 04/23/21  Yes [provider]  pantoprazole (PROTONIX) 40 MG tablet Take 40 mg by mouth every morning. 04/10/21  Yes [provider]     Vital Signs: BP 127/71   Pulse 77   Temp 98.3 F (36.8 C) (Axillary)   Resp 10   Ht 5' (1.524 m)   Wt 112 lb 14 oz (51.2 kg)   SpO2 99%   BMI 22.04 kg/m   Physical Exam Vitals reviewed.  Constitutional:      General: She is awake.     Interventions: She is intubated.  HENT:     Head: Normocephalic and atraumatic.  Eyes:      Extraocular Movements: Extraocular movements intact.     Pupils: Pupils are equal, round, and reactive to light.  Cardiovascular:     Rate and Rhythm: Normal rate and regular rhythm.     Pulses: Normal pulses.  Pulmonary:     Effort: She is intubated.  Musculoskeletal:     Comments: Good strength bilaterally upper and lower extremities  Skin:    General: Skin is warm and dry.     Comments: R CFA access site soft and non-tender  Neurological:     General: No focal deficit present.  Psychiatric:        Behavior: Behavior is cooperative.     Imaging: DG Abd Portable 1V  Result Date: 01/06/2022 CLINICAL DATA:  Enteric catheter placement EXAM: PORTABLE ABDOMEN - 1 VIEW COMPARISON:  None Available. FINDINGS: Frontal view of the lower chest and upper abdomen demonstrates enteric catheter passing below diaphragm tip and side port projecting over the gastric body. Lung bases are clear. Excreted contrast within the kidneys. Bowel gas pattern is unremarkable. IMPRESSION: 1. Enteric catheter tip and side port projecting over gastric body. Electronically Signed   By: Randa Ngo M.D.   On: 01/06/2022 18:19   DG Chest Portable 1 View  Result Date: 01/06/2022 CLINICAL DATA:  Provided history: Altered mental status. Code stroke. EXAM: PORTABLE CHEST 1 VIEW COMPARISON:  Chest radiographs 02/28/2020 and earlier. Chest CT 05/01/2021. FINDINGS:  A loop recorder device overlies the left chest. Heart size within normal limits. Aortic atherosclerosis. Mild atelectasis within the right lung base. The 1.4 cm focus of nodular airspace consolidation demonstrated within the left upper lobe on the prior chest CT of 05/01/2021 is occult on the current exam. Mild chronic elevation of the right hemidiaphragm. No evidence of pleural effusion or pneumothorax. No acute bony abnormality identified. Levocurvature of the thoracic spine. Thoracic spondylosis. IMPRESSION: Mild atelectasis within the right lung base. The 1.4  cm focus of nodular airspace consolidation demonstrated within the left upper lobe on the prior chest CT of 05/01/2021 is occult on the current examination. Please refer to this prior examination for further description and for follow-up recommendations. Aortic Atherosclerosis (ICD10-I70.0). Electronically Signed   By: Kellie Simmering D.O.   On: 01/06/2022 09:16   CT ANGIO HEAD NECK W WO CM W PERF (CODE STROKE)  Result Date: 01/06/2022 CLINICAL DATA:  Neuro deficit, acute, stroke suspected. EXAM: CT ANGIOGRAPHY HEAD AND NECK CT PERFUSION BRAIN TECHNIQUE: Multidetector CT imaging of the head and neck was performed using the standard protocol during bolus administration of intravenous contrast. Multiplanar CT image reconstructions and MIPs were obtained to evaluate the vascular anatomy. Carotid stenosis measurements (when applicable) are obtained utilizing NASCET criteria, using the distal internal carotid diameter as the denominator. Multiphase CT imaging of the brain was performed following IV bolus contrast injection. Subsequent parametric perfusion maps were calculated using RAPID software. RADIATION DOSE REDUCTION: This exam was performed according to the departmental dose-optimization program which includes automated exposure control, adjustment of the mA and/or kV according to patient size and/or use of iterative reconstruction technique. CONTRAST:  174m OMNIPAQUE IOHEXOL 350 MG/ML SOLN COMPARISON:  Head CT same day. MRI 09/25/2021. CT angiography 09/25/2021. FINDINGS: CTA NECK FINDINGS Aortic arch: Aortic atherosclerosis. Innominate artery origin not included on the scan. Small left vertebral artery arises directly from the arch Right carotid system: Common carotid artery widely patent to the bifurcation. Carotid bifurcation is normal without soft or calcified plaque. Cervical ICA is normal. Left carotid system: Common carotid artery widely patent to the bifurcation. Very minimal plaque at the bifurcation  but no stenosis or irregularity. Cervical ICA widely patent. Vertebral arteries: Dominant right vertebral artery arises normally from the subclavian artery and is widely patent through the cervical region to the foramen magnum. As noted above, left vertebral artery is a tiny vessel that arises from the arch in actually has a second origin at the left subclavian artery, the 2 branches coming together in the transverse foramen of the lower cervical spine. Beyond that, the vessel is patent to the foramen magnum. Skeleton: Scoliosis without significant degenerative change. Other neck: No mass or lymphadenopathy. Upper chest: Emphysema without acute process. Review of the MIP images confirms the above findings CTA HEAD FINDINGS Anterior circulation: Right internal carotid artery widely patent through the skull base and siphon region. 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 Limited evaluation because of dense streak artifact the anterior and middle cerebral vessels are patent. No large vessel occlusion or proximal stenosis. Left internal carotid arteries widely patent through the skull base and siphon region. The anterior and middle cerebral vessels are normal without aneurysm or stenosis. Posterior circulation: Dominant right vertebral artery is widely patent to the basilar artery. Right PICA shows flow. Non dominant left vertebral artery is occluded in the V4 segment. The basilar artery shows flow within both anterior inferior cerebellar arteries. There is an embolus at the basilar  tip. Flow is seen within both posterior cerebral arteries. Venous sinuses: Patent and normal Anatomic variants: None significant otherwise Review of the MIP images confirms the above findings CT Brain Perfusion Findings: ASPECTS: 10 CBF (<30%) Volume: 66m Perfusion (Tmax>6.0s) volume: 04mMismatch Volume: 37m337mnfarction Location:None demonstrated IMPRESSION: 1. Occlusion of the non dominant left vertebral artery in the  V4 segment. Basilar artery shows flow proximally but there is an embolus at the basilar tip. Some flow is seen within both posterior cerebral arteries. 2. 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 Limited evaluation because of dense streak artifact. No anterior circulation large vessel occlusion or proximal stenosis. 3. No carotid bifurcation disease. 4. No evidence of infarction by CT perfusion imaging. 5. Aortic atherosclerosis. 6. Emphysema without acute process. 7. The left vertebral artery has 2 origins, 1 arising directly from the aortic arch and a second origin at the left subclavian artery, the 2 branches coming together in the transverse foramen of the lower cervical spine. Beyond that, the vessel is patent to the foramen magnum. Left V4 occlusion described above. Aortic Atherosclerosis (ICD10-I70.0) and Emphysema (ICD10-J43.9). Case discussed with Dr. StaQuinn Axering performance. Basilar tip embolic occlusion communicated at approximally 0905 hours via Amion. Electronically Signed   By: MarNelson ChimesD.   On: 01/06/2022 09:07   CT HEAD CODE STROKE WO CONTRAST  Result Date: 01/06/2022 CLINICAL DATA:  Code stroke.  Neuro deficit, acute, stroke suspected EXAM: CT HEAD WITHOUT CONTRAST TECHNIQUE: Contiguous axial images were obtained from the base of the skull through the vertex without intravenous contrast. RADIATION DOSE REDUCTION: This exam was performed according to the departmental dose-optimization program which includes automated exposure control, adjustment of the mA and/or kV according to patient size and/or use of iterative reconstruction technique. COMPARISON:  CT 09/25/2021. FINDINGS: Brain: No evidence of acute large vascular territory infarction, hemorrhage, hydrocephalus, extra-axial collection or mass lesion/mass effect. Similar small scattered remote infarcts. Vascular: Embolization coils in the right paraclinoid ICA. Skull: No acute fracture. Sinuses/Orbits: No  acute finding. Other: No mastoid effusions. ASPECTS ([facility] Early CT Score) total score (0-10 with 10 being normal): 10. IMPRESSION: 1. No evidence of acute intracranial abnormality. 2. ASPECTS is 10. Code stroke imaging results were communicated on 01/06/2022 at 8:30 am to [facility] secure text paging. Electronically Signed   By: FreMargaretha SheffieldD.   On: 01/06/2022 08:30   CUP PACEART REMOTE DEVICE CHECK  Result Date: 01/05/2022 ILR summary report received. Battery status OK. Normal device function. No new symptom, tachy, brady, or pause episodes. No new AF episodes. Monthly summary reports and ROV/PRN LA   Labs:  CBC: Recent Labs    09/25/21 1009 09/25/21 1033 01/06/22 0818 01/06/22 0823 01/06/22 1220 01/07/22 0428  WBC 6.1  --  6.9  --   --  10.3  HGB 14.4   < > 12.9 13.3 12.9 12.2  HCT 45.7   < > 40.3 39.0 38.0 35.8*  PLT 312  --  204  --   --  234   < > = values in this interval not displayed.    COAGS: Recent Labs    09/25/21 1009 01/06/22 0818  INR 1.0 1.0  APTT 28 23*    BMP: Recent Labs    09/25/21 1009 09/25/21 1033 01/06/22 0818 01/06/22 0823 01/06/22 1220 01/07/22 0428  NA 141 140 139 141 137 140  K 4.3 4.3 4.0 4.1 3.5 3.5  CL 105 104 103 103  --  105  CO2 28  --  27  --   --  25  GLUCOSE 98 100* 156* 149*  --  97  BUN 8 11 10 11   --  6*  CALCIUM 9.3  --  8.8*  --   --  8.7*  CREATININE 0.67 0.60 0.66 0.60  --  0.53  GFRNONAA >60  --  >60  --   --  >60    LIVER FUNCTION TESTS: Recent Labs    09/25/21 1009 01/06/22 0818 01/07/22 0428  BILITOT 0.7 0.8 0.6  AST 19 19 19   ALT 12 21 17   ALKPHOS 55 53 51  PROT 7.0 6.1* 5.7*  ALBUMIN 4.1 3.7 3.4*    Assessment and Plan:  S/P revascularization of basilar artery occlusion --remarkable overnight recovery with no definitive remaining deficits --MRI and extubation per stroke team today --further care per primary team  IR available for reconsult as  needed  Electronically Signed: Pasty Spillers, PA 01/07/2022, 8:54 AM   I spent a total of 15 Minutes at the the patient's bedside AND on the patient's hospital floor or unit, greater than 50% of which was counseling/coordinating care for post cerebral occlusion revascularization follow up.

## 2022-01-07 NOTE — Progress Notes (Signed)
   NAME:  Cassandra Alexander, MRN:  [id number], DOB:  [birth year], LOS: 1 ADMISSION DATE:  01/06/2022, CONSULTATION DATE:  01/06/22 REFERRING MD:  Milagros Loll, CHIEF COMPLAINT:  AMS   History of Present Illness:  76 year old woman with prior CVA, cerebral aneurysm post coiling, HLD, anxiety, depression presenting with confusion and global weakness.  Stat CTA head/neck showing left V4 occlusion and basilar tip embolus sent for mechanical thrombectomy.  Intubated for airway protection.  TICI 2c flow post, arrives to ICU intubated and sedated. PCCM consulted for vent management.  Pertinent  Medical History  HTN HLD Distant hx cerebral aneurysm clipping Arthritis TIA/CVA no residual deficits known  Significant Hospital Events: Including procedures, antibiotic start and stop dates in addition to other pertinent events   10.25 admit, mechanical thrombectomy, vent  Interim History / Subjective:  MRI pending. Awake on vent, pointing at ETT and nods head when asked if shes ready for it to be removed. Family at bedside.  Objective   Blood pressure 135/66, pulse 83, temperature 98.3 F (36.8 C), temperature source Axillary, resp. rate 17, height 5' (1.524 m), weight 51.2 kg, SpO2 99 %.    Vent Mode: PRVC FiO2 (%):  [40 %-100 %] 40 % Set Rate:  [16 bmp] 16 bmp Vt Set:  [360 mL] 360 mL PEEP:  [5 cmH20] 5 cmH20 Plateau Pressure:  [12 cmH20] 12 cmH20   Intake/Output Summary (Last 24 hours) at 01/07/2022 0803 Last data filed at 01/07/2022 0601 Gross per 24 hour  Intake 2663.65 ml  Output 2680 ml  Net -16.35 ml    Filed Weights   01/06/22 0800 01/06/22 1138  Weight: 51.2 kg 51.2 kg    Examination: General: Adult female, in NAD. HENT: [place]/AT, ETT in place. Lungs: Normal effort, CTAB. Cardiovascular: RRR, ext warm. Abdomen: BS x 4, S/NT/ND. Extremities: No edema. Neuro: On light sedation but awake and follows commands. Skin: Warm, dry.   Assessment & Plan:   Acute left vertebral and partial  basilar artery occlusion (CVA) s/p mechanical thrombectomy with TICI 2c flow. - Post procedure care per IR. - MRI follow up today.  Post procedural ventilatory support - Tolerated weaning for a few minutes this AM but decided to place back on full support until after MRI then plan to wean and extubate.  Abnormal CT chest - Would get CT chest prior to DC to assure nodular consolidation has resolved.    Rest per primary team.  Best Practice (right click and "Reselect all SmartList Selections" daily)   Diet/type: NPO, swallow eval after extubation DVT prophylaxis: per primary GI prophylaxis: PPI Lines: N/A Foley:  N/A Code Status:  full code Last date of multidisciplinary goals of care discussion [Per primary]  Critical care time: 30 minutes    Montey Hora, [place] For pager details, please see AMION or use Epic chat  After 1900, please call [place] for cross coverage needs 01/07/2022, 8:14 AM

## 2022-01-07 NOTE — Progress Notes (Signed)
Patient was transported to MRI & back to 4N24 without any complications.

## 2022-01-07 NOTE — Progress Notes (Addendum)
CBG 68. Given 12.5 g of 50% dextrose. Will reassess CBG in 15 minutes.   Update: next CBG was 89.   Montez Hageman RN

## 2022-01-07 NOTE — Progress Notes (Signed)
SLP Cancellation Note  Patient Details Name: Cassandra Alexander MRN: [id number] DOB: [birth year]   Cancelled treatment:       Reason Eval/Treat Not Completed: Patient not medically ready (on vent). Will f/u as able.     Osie Bond., M.A. [place] Office [phone]  Secure chat preferred  01/07/2022, 7:45 AM

## 2022-01-07 NOTE — Procedures (Signed)
Extubation Procedure Note  Patient Details:   Name: Cassandra Alexander DOB: 1945-07-07 MRN: [id number]   Airway Documentation:    Vent end date: 01/07/22 Vent end time: 1207   Evaluation  O2 sats: stable throughout Complications: No apparent complications Patient did tolerate procedure well. Bilateral Breath Sounds: Rhonchi, Diminished   Yes Pt was extubated to 4L [place] with no apparent complications. Audible cuffleak was heard prior extubation and no signs of stridor at this time. Pt is able to speak and state name.RT will continue to monitor  Felecia Jan 01/07/2022, 12:08 PM

## 2022-01-07 NOTE — Evaluation (Signed)
Physical Therapy Evaluation Patient Details Name: Cassandra Alexander MRN: [id number] DOB: [birth year] Today's Date: 01/07/2022  History of Present Illness  76 y.o. female presents to [facility] hospital on 01/06/2022 with confusion. Of note pt was admitted in June 2023 with ischemic CVA with petechial hemorrhage. CTA demonstrates occlusion non-dominant L V4 and embolus in the basilar tip. Pt underwent revascularization on 01/06/2022. XIP:JASNKNL, depression, TIA, cerebral aneurysm status post coiling, HTN, gastritis, dysphagia, esophageal web, GERD, partial colectomy, IBS.  Clinical Impression  Pt presents to PT with deficits in gait, balance, functional mobility, and cognition. Pt is impulsive during session, distracted by staff often. Pt demonstrates the need for UE support to stabilize at this time, with increased lateral sway. Pt will benefit from continued aggressive mobilization in an effort to restore independent mobility. PT will further assess the need for DME tomorrow as some of the pt's balance deviations may still be attributable to recent intubation. Pt will benefit from outpatient PT at the time of discharge.     Recommendations for follow up therapy are one component of a multi-disciplinary discharge planning process, led by the attending physician.  Recommendations may be updated based on patient status, additional functional criteria and insurance authorization.  Follow Up Recommendations Outpatient PT      Assistance Recommended at Discharge Intermittent Supervision/Assistance  Patient can return home with the following  A little help with walking and/or transfers;A little help with bathing/dressing/bathroom;Assistance with cooking/housework;Direct supervision/assist for medications management;Direct supervision/assist for financial management;Assist for transportation;Help with stairs or ramp for entrance    Equipment Recommendations  (TBD pending assessment tomorrow)  Recommendations for  Other Services       Functional Status Assessment Patient has had a recent decline in their functional status and demonstrates the ability to make significant improvements in function in a reasonable and predictable amount of time.     Precautions / Restrictions Precautions Precautions: Fall Restrictions Weight Bearing Restrictions: No      Mobility  Bed Mobility Overal bed mobility: Needs Assistance Bed Mobility: Supine to Sit     Supine to sit: Supervision, HOB elevated     General bed mobility comments: increased time    Transfers Overall transfer level: Needs assistance Equipment used: None Transfers: Sit to/from Stand Sit to Stand: Min guard                Ambulation/Gait Ambulation/Gait assistance: Min guard Gait Distance (Feet): 80 Feet Assistive device: IV Pole Gait Pattern/deviations: Step-through pattern, Trunk flexed Gait velocity: reduced Gait velocity interpretation: <1.31 ft/sec, indicative of household ambulator   General Gait Details: pt with slowed step-through gait, increased trunk flexion partially attributable to pushing walker anteriorly. Increased lateral sway.  Stairs            Wheelchair Mobility    Modified Rankin (Stroke Patients Only) Modified Rankin (Stroke Patients Only) Pre-Morbid Rankin Score: No symptoms Modified Rankin: Moderately severe disability     Balance Overall balance assessment: Needs assistance Sitting-balance support: No upper extremity supported, Feet supported Sitting balance-Leahy Scale: Fair     Standing balance support: Reliant on assistive device for balance, Single extremity supported Standing balance-Leahy Scale: Poor                               Pertinent Vitals/Pain Pain Assessment Pain Assessment: No/denies pain    Home Living Family/patient expects to be discharged to:: Private residence Living Arrangements: Spouse/significant other Available Help at Discharge:  Family;Available 24 hours/day Type of Home: House Home Access: Stairs to enter Entrance Stairs-Rails: Right Entrance Stairs-Number of Steps: 4 Alternate Level Stairs-Number of Steps: 15 Home Layout: Multi-level Home Equipment: None Additional Comments: Husband works part time from home; can provide assist    Prior Function Prior Level of Function : Independent/Modified Independent;Driving             Mobility Comments: ambulates without a device       Hand Dominance   Dominant Hand: Right    Extremity/Trunk Assessment   Upper Extremity Assessment Upper Extremity Assessment: Overall WFL for tasks assessed    Lower Extremity Assessment Lower Extremity Assessment: Generalized weakness (no focal weakness noted)    Cervical / Trunk Assessment Cervical / Trunk Assessment: Normal  Communication   Communication: Expressive difficulties  Cognition Arousal/Alertness: Awake/alert Behavior During Therapy: Impulsive Overall Cognitive Status: Impaired/Different from baseline Area of Impairment: Memory, Awareness                     Memory: Decreased short-term memory, Decreased recall of precautions     Awareness: Emergent            General Comments General comments (skin integrity, edema, etc.): VSS on RA    Exercises     Assessment/Plan    PT Assessment Patient needs continued PT services  PT Problem List Decreased strength;Decreased balance;Decreased mobility;Decreased knowledge of use of DME;Decreased cognition;Decreased safety awareness;Decreased knowledge of precautions       PT Treatment Interventions DME instruction;Stair training;Gait training;Functional mobility training;Therapeutic activities;Therapeutic exercise;Balance training;Neuromuscular re-education;Patient/family education;Cognitive remediation    PT Goals (Current goals can be found in the Care Plan section)  Acute Rehab PT Goals Patient Stated Goal: to go home PT Goal  Formulation: With patient Time For Goal Achievement: 01/21/22 Potential to Achieve Goals: Good Additional Goals Additional Goal #1: Pt will score >19/24 on the DGI in an effort to indicate a reduced risk for falls    Frequency Min 4X/week     Co-evaluation               AM-PAC PT "6 Clicks" Mobility  Outcome Measure Help needed turning from your back to your side while in a flat bed without using bedrails?: A Little Help needed moving from lying on your back to sitting on the side of a flat bed without using bedrails?: A Little Help needed moving to and from a bed to a chair (including a wheelchair)?: A Little Help needed standing up from a chair using your arms (e.g., wheelchair or bedside chair)?: A Little Help needed to walk in hospital room?: A Little Help needed climbing 3-5 steps with a railing? : A Lot 6 Click Score: 17    End of Session   Activity Tolerance: Patient tolerated treatment well Patient left: in chair;with call bell/phone within reach;with chair alarm set Nurse Communication: Mobility status PT Visit Diagnosis: Other abnormalities of gait and mobility (R26.89);Other symptoms and signs involving the nervous system (R29.898)    Time: 6659-9357 PT Time Calculation (min) (ACUTE ONLY): 35 min   Charges:   PT Evaluation $PT Eval Low Complexity: [place], PT, DPT Acute Rehabilitation Office [phone]   Zenaida Niece 01/07/2022, 4:21 PM

## 2022-01-07 NOTE — Progress Notes (Signed)
OT Cancellation Note  Patient Details Name: Cassandra Alexander MRN: [id number] DOB: [birth year]   Cancelled Treatment:    Reason Eval/Treat Not Completed: Patient not medically ready;Active bedrest order (Intubated/sedated)  Rashaud Ybarbo,[place] 01/07/2022, 7:11 AM Maurie Boettcher, OT/L   Acute OT Clinical Specialist Acute Rehabilitation Services Pager [phone] Office [phone]

## 2022-01-07 NOTE — Progress Notes (Signed)
[facility] ADULT ICU REPLACEMENT PROTOCOL   The patient does apply for the [facility] Adult ICU Electrolyte Replacment Protocol based on the criteria listed below:   1.Exclusion criteria: TCTS patients, ECMO patients, and Dialysis patients 2. Is GFR >/= 30 ml/min? Yes.    Patient's GFR today is >60 3. Is SCr </= 2? Yes.   Patient's SCr is 0.53 mg/dL 4. Did SCr increase >/= 0.5 in 24 hours? No. 5.Pt's weight >40kg  Yes.   6. Abnormal electrolyte(s): K 3.5  7. Electrolytes replaced per protocol 8.  Call MD STAT for K+ </= 2.5, Phos </= 1, or Mag </= 1 Physician:    Ronda Fairly A 01/07/2022 5:44 AM

## 2022-01-08 DIAGNOSIS — I651 Occlusion and stenosis of basilar artery: Secondary | ICD-10-CM | POA: Diagnosis not present

## 2022-01-08 LAB — BASIC METABOLIC PANEL
Anion gap: 7 (ref 5–15)
BUN: 6 mg/dL — ABNORMAL LOW (ref 8–23)
CO2: 24 mmol/L (ref 22–32)
Calcium: 8.5 mg/dL — ABNORMAL LOW (ref 8.9–10.3)
Chloride: 109 mmol/L (ref 98–111)
Creatinine, Ser: 0.56 mg/dL (ref 0.44–1.00)
GFR, Estimated: >60 mL/min (ref >60)
Glucose, Bld: 127 mg/dL — ABNORMAL HIGH (ref 70–99)
Potassium: 3.3 mmol/L — ABNORMAL LOW (ref 3.5–5.1)
Sodium: 140 mmol/L (ref 135–145)

## 2022-01-08 LAB — GLUCOSE, CAPILLARY
Glucose-Capillary: 117 mg/dL — ABNORMAL HIGH (ref 70–99)
Glucose-Capillary: 123 mg/dL — ABNORMAL HIGH (ref 70–99)
Glucose-Capillary: 124 mg/dL — ABNORMAL HIGH (ref 70–99)
Glucose-Capillary: 137 mg/dL — ABNORMAL HIGH (ref 70–99)

## 2022-01-08 MED ORDER — ATORVASTATIN CALCIUM 80 MG PO TABS: 80.0000 mg | ORAL_TABLET | Freq: Every day | ORAL | 11 refills | Status: AC

## 2022-01-08 MED ORDER — ASPIRIN 81 MG PO CHEW: 81.0000 mg | CHEWABLE_TABLET | Freq: Every day | ORAL | 0 refills | Status: DC

## 2022-01-08 MED ORDER — STUDY - OCEANIC-STROKE - ASUNDEXIAN 50 MG OR PLACEBO TABLET (PI-SETHI): 1.0000 | ORAL_TABLET | Freq: Every day | ORAL | 0 refills | Status: DC

## 2022-01-08 MED ORDER — POTASSIUM CHLORIDE CRYS ER 20 MEQ PO TBCR
20.0000 meq | EXTENDED_RELEASE_TABLET | ORAL | Status: DC
  Administered 2022-01-08: 20 meq via ORAL
  Filled 2022-01-08 (×2): qty 1

## 2022-01-08 MED ORDER — CLOPIDOGREL BISULFATE 75 MG PO TABS: 75.0000 mg | ORAL_TABLET | Freq: Every day | ORAL | 2 refills | Status: DC

## 2022-01-08 MED ORDER — STUDY - OCEANIC-STROKE - ASUNDEXIAN 50 MG OR PLACEBO TABLET (PI-SETHI)
1.0000 | ORAL_TABLET | Freq: Every day | ORAL | Status: DC
  Administered 2022-01-08: 50 mg via ORAL
  Filled 2022-01-08: qty 1

## 2022-01-08 MED ORDER — POTASSIUM CHLORIDE 10 MEQ/100ML IV SOLN
10.0000 meq | INTRAVENOUS | Status: AC
  Administered 2022-01-08 (×4): 10 meq via INTRAVENOUS
  Filled 2022-01-08: qty 100

## 2022-01-08 MED ORDER — CLOPIDOGREL BISULFATE 75 MG PO TABS: 75.0000 mg | ORAL_TABLET | Freq: Every day | ORAL | 2 refills | Status: AC

## 2022-01-08 MED ORDER — PANTOPRAZOLE SODIUM 40 MG PO TBEC: 40.0000 mg | DELAYED_RELEASE_TABLET | Freq: Every day | ORAL | Status: DC

## 2022-01-08 MED ORDER — POTASSIUM CHLORIDE CRYS ER 20 MEQ PO TBCR
20.0000 meq | EXTENDED_RELEASE_TABLET | Freq: Once | ORAL | Status: AC
  Administered 2022-01-08: 20 meq via ORAL
  Filled 2022-01-08: qty 1

## 2022-01-08 NOTE — Progress Notes (Signed)
[facility] ADULT ICU REPLACEMENT PROTOCOL   The patient does apply for the [facility] Adult ICU Electrolyte Replacment Protocol based on the criteria listed below:   1.Exclusion criteria: TCTS patients, ECMO patients, and Dialysis patients 2. Is GFR >/= 30 ml/min? Yes.    Patient's GFR today is >60 3. Is SCr </= 2? Yes.   Patient's SCr is 0.56 mg/dL 4. Did SCr increase >/= 0.5 in 24 hours? No. 5.Pt's weight >40kg  Yes.   6. Abnormal electrolyte(s): K+ 3.3  7. Electrolytes replaced per protocol 8.  Call MD STAT for K+ </= 2.5, Phos </= 1, or Mag </= 1 Physician:  Ed Blalock [facility] 01/08/2022 6:21 AM

## 2022-01-08 NOTE — Progress Notes (Signed)
Physical Therapy Treatment Patient Details Name: Cassandra Alexander MRN: [id number] DOB: 03-14-1946 Today's Date: 01/08/2022   History of Present Illness 76 y.o. female presents to [facility] hospital on 01/06/2022 with confusion. Of note pt was admitted in June 2023 with ischemic CVA with petechial hemorrhage. CTA demonstrates occlusion non-dominant L V4 and embolus in the basilar tip. Pt underwent revascularization on 01/06/2022. MMN:[id number], depression, TIA, cerebral aneurysm status post coiling, HTN, gastritis, dysphagia, esophageal web, GERD, partial colectomy, IBS.    PT Comments    Pt tolerates treatment well, ambulating for increased distances. Pt continues to demonstrate instability when standing or mobilizing without UE support. With use of RW the pt demonstrates improved stability, however she remains easily distracted and has a tendency to bump into objects when not attending to ambulation. Pt will benefit from continued acute PT services in an effort to improve stability and reduce falls risk. PT recommends use of RW for all out of bed mobility along with family assistance when standing or ambulating. PT recommends outpatient PT at this time.   Recommendations for follow up therapy are one component of a multi-disciplinary discharge planning process, led by the attending physician.  Recommendations may be updated based on patient status, additional functional criteria and insurance authorization.  Follow Up Recommendations  Outpatient PT     Assistance Recommended at Discharge Intermittent Supervision/Assistance  Patient can return home with the following A little help with walking and/or transfers;A little help with bathing/dressing/bathroom;Assistance with cooking/housework;Direct supervision/assist for medications management;Direct supervision/assist for financial management;Assist for transportation;Help with stairs or ramp for entrance   Equipment Recommendations  None recommended by PT  (pt already owns RW)    Recommendations for Other Services       Precautions / Restrictions Precautions Precautions: Fall Restrictions Weight Bearing Restrictions: No     Mobility  Bed Mobility Overal bed mobility: Needs Assistance Bed Mobility: Supine to Sit     Supine to sit: Supervision, HOB elevated          Transfers Overall transfer level: Needs assistance Equipment used: None, Rolling walker (2 wheels) Transfers: Sit to/from Stand Sit to Stand: Min guard                Ambulation/Gait Ambulation/Gait assistance: Min guard Gait Distance (Feet): 150 Feet (additional 100' without device) Assistive device: None, Rolling walker (2 wheels) Gait Pattern/deviations: Step-through pattern, Drifts right/left Gait velocity: reduced Gait velocity interpretation: <1.8 ft/sec, indicate of risk for recurrent falls   General Gait Details: pt with slowed step-through gait, increased lateral sway and high guard without UE support of the RW. Improved stability with UE support of RW however pt does bump into 2 objects when distracted when ambulating   Stairs Stairs:  (spouse and pt decline need for stair training, spouse feels confident in ability to assist pt with stairs, gait belt provided)           Wheelchair Mobility    Modified Rankin (Stroke Patients Only) Modified Rankin (Stroke Patients Only) Pre-Morbid Rankin Score: No symptoms Modified Rankin: Moderately severe disability     Balance Overall balance assessment: Needs assistance Sitting-balance support: No upper extremity supported, Feet supported Sitting balance-Leahy Scale: Good     Standing balance support: Single extremity supported, Reliant on assistive device for balance Standing balance-Leahy Scale: Poor Standing balance comment: minA without UE support  Cognition Arousal/Alertness: Awake/alert Behavior During Therapy: Impulsive Overall Cognitive  Status: Impaired/Different from baseline Area of Impairment: Memory, Attention, Awareness, Safety/judgement                   Current Attention Level: Selective Memory: Decreased recall of precautions, Decreased short-term memory   Safety/Judgement: Decreased awareness of safety, Decreased awareness of deficits Awareness: Emergent            Exercises      General Comments General comments (skin integrity, edema, etc.): VSS on RA, pt does report dizziness when ambulating with RW initially, dizziness subsides with sitting, BP 130/96      Pertinent Vitals/Pain Pain Assessment Pain Assessment: Faces Faces Pain Scale: Hurts little more Pain Location: hand at IV site Pain Descriptors / Indicators: Burning Pain Intervention(s): Monitored during session    Home Living     Available Help at Discharge: Family;Available 24 hours/day Type of Home: House                  Prior Function            PT Goals (current goals can now be found in the care plan section) Acute Rehab PT Goals Patient Stated Goal: to go home Progress towards PT goals: Progressing toward goals    Frequency    Min 4X/week      PT Plan Current plan remains appropriate    Co-evaluation              AM-PAC PT "6 Clicks" Mobility   Outcome Measure  Help needed turning from your back to your side while in a flat bed without using bedrails?: A Little Help needed moving from lying on your back to sitting on the side of a flat bed without using bedrails?: A Little Help needed moving to and from a bed to a chair (including a wheelchair)?: A Little Help needed standing up from a chair using your arms (e.g., wheelchair or bedside chair)?: A Little Help needed to walk in hospital room?: A Little Help needed climbing 3-5 steps with a railing? : A Little 6 Click Score: 18    End of Session   Activity Tolerance: Patient tolerated treatment well Patient left: in chair;with call  bell/phone within reach;with family/visitor present Nurse Communication: Mobility status PT Visit Diagnosis: Other abnormalities of gait and mobility (R26.89);Other symptoms and signs involving the nervous system (R29.898)     Time: 8938-1017 PT Time Calculation (min) (ACUTE ONLY): 24 min  Charges:  $Gait Training: 23-37 mins                     Zenaida Niece, PT, DPT Acute Rehabilitation Office [place] 01/08/2022, 10:42 AM

## 2022-01-08 NOTE — Progress Notes (Signed)
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 Pt requests to get out of bed. She denies H/A, nausea. She endorses tenderness to R CFA puncture site.   Allergies: Cortisone and Penicillins  Medications: Prior to Admission medications   Medication Sig Start Date End Date Taking? Authorizing Provider  aspirin EC 81 MG tablet Take 1 tablet (81 mg total) by mouth daily. Swallow whole. Patient taking differently: Take 81 mg by mouth daily. 09/28/21  Yes Elodia Florence., MD  atorvastatin (LIPITOR) 80 MG tablet Take 1 tablet (80 mg total) by mouth daily. 09/28/21 09/23/22 Yes Elodia Florence., MD  citalopram (CELEXA) 10 MG tablet Take 10 mg by mouth at bedtime.   Yes [provider]  HYDROcodone-acetaminophen (NORCO) 10-325 MG tablet Take 1 tablet by mouth every 4 (four) hours as needed for pain (max of 4 tablets a day) Patient taking differently: Take 1 tablet by mouth every 6 (six) hours as needed (pain). 12/22/21  Yes   imipramine (TOFRANIL) 50 MG tablet Take 2 tablets by mouth at bedtime.   Yes [provider]  lisinopril (ZESTRIL) 10 MG tablet Take 10 mg by mouth daily. 09/03/21  Yes [provider]  mesalamine (LIALDA) 1.2 g EC tablet Take 2.4 g by mouth daily with breakfast. 04/23/21  Yes [provider]  pantoprazole (PROTONIX) 40 MG tablet Take 40 mg by mouth every morning. 04/10/21  Yes [provider]     Vital Signs: BP 135/74   Pulse 86   Temp 98.6 F (37 C) (Axillary)   Resp 14   Ht 5' (1.524 m)   Wt 112 lb 14 oz (51.2 kg)   SpO2 94%   BMI 22.04 kg/m   Physical Exam Vitals reviewed.  Constitutional:      General: She is not in acute distress.    Appearance: Normal  appearance. She is not ill-appearing.  HENT:     Head: Normocephalic and atraumatic.  Eyes:     Extraocular Movements: Extraocular movements intact.     Pupils: Pupils are equal, round, and reactive to light.  Cardiovascular:     Rate and Rhythm: Normal rate and regular rhythm.  Pulmonary:     Effort: Pulmonary effort is normal. No respiratory distress.     Breath sounds: Normal breath sounds.  Skin:    General: Skin is warm and dry.     Comments: Right CFA access site is soft with no active bleeding and no appreciable pseudoaneurysm. Dressing is C/D/I      Neurological:     Mental Status: She is alert.     Comments: Alert, aware but confused as to why she is here.  Speech intact but she exhibits difficulty finding words.  PERRL bilaterally No facial droop noted Tongue midline Can spontaneously move all 4 extremities. Hand grip strength equal bilaterally. Fine motor and coordination intact.          Psychiatric:        Mood and Affect: Mood normal.        Behavior: Behavior normal.     Imaging: IR PERCUTANEOUS ART THROMBECTOMY/INFUSION INTRACRANIAL INC DIAG ANGIO  Result Date: 01/08/2022 INDICATION: History of acute change in mental status, and diffuse weakness and left sided nystagmus. Occluded distal basilar artery on  CT angiogram of the head and neck 01/06/2022. EXAM: 1. EMERGENT LARGE VESSEL OCCLUSION THROMBOLYSIS (POSTERIOR CIRCULATION) COMPARISON:  None Available. MEDICATIONS: Ancef 2 g IV antibiotic was administered within 1 hour of the procedure. ANESTHESIA/SEDATION: General anesthesia. CONTRAST:  Omnipaque 300 approximately 90 cc. FLUOROSCOPY TIME:  Fluoroscopy Time: 53 minutes 12 seconds (1411 mGy). COMPLICATIONS: None immediate. TECHNIQUE: Following a full explanation of the procedure along with the potential associated complications, an informed witnessed consent was obtained. The risks of intracranial hemorrhage of 10%, worsening neurological deficit,  ventilator dependency, death and inability to revascularize were all reviewed in detail with the patient's husband. The patient was then put under general anesthesia by the Department of Anesthesiology at [facility]. The right groin was prepped and draped in the usual sterile fashion. Thereafter using modified Seldinger technique, transfemoral access into the right common femoral artery was obtained without difficulty. Over a 0.035 inch guidewire an 8 [place] 15 cm [place] Pinnacle sheath was inserted. Through this, and also over a 0.035 inch guidewire a 5.5 French 125 cm Berenstein support catheter inside of an 088 95 cm Neuron Max sheath was advanced to the aortic arch region. The support catheter was then advanced into the right subclavian artery just proximal to the origin of the right vertebral artery. The guidewire was removed. Good aspiration was obtained from the hub of the Neuron Max sheath. Control arteriogram at the origin of the right vertebral artery was then performed centered extra cranially and intracranially. FINDINGS: The right vertebral artery origin demonstrated patency proximally, with a moderate kink distal to this. More distally, the vessel is seen to opacify to the cranial skull base. Contrast was seen extending into the proximal basilar artery. PROCEDURE: Attempts were made to advanced an intermediary catheter through the right vertebral artery distally with an 035 inch guidewire, 024 micro guidewire with an 027 microcatheter without success due to the extreme of the innominate artery origin from the aortic arch resulting in herniation of the platform into the aortic arch. Eventually access was obtained using a 125 cm 4 French sheath inside of an 80 cm Infinity guide catheter. The Infinity guide sheath could only be advanced into the proximal 1/3 of the right vertebral artery with further advancement resulting in herniation of the platform into the aortic arch. The guide sheath was  then exchanged for a 300 cm 014 inch BMW support guidewire in the V3 region of the right vertebral artery. An 021 160 cm microcatheter inside of a 132 cm 055 Zoom aspiration catheter was advanced without difficulty to the proximal basilar artery. The micro guidewire was then gently advanced without difficulty into the left posterior cerebral artery P2 region followed by the microcatheter. The guidewire was removed. Good aspiration was obtained from the hub of the microcatheter which was connected to continuous heparinized saline infusion. At this time, the Zoom aspiration catheter was advanced without difficulty into the proximal P2 region. A 4 mm x 40 mm Solitaire X retrieval device was then deployed. Thereafter, the proximal aspiration at the hub of the Zoom aspiration catheter with a Penumbra pump, and at the hub of the [place] guide catheter with a 20 mL syringe, the combination of the retrieval device, the Zoom aspiration catheter was retrieved and removed. A control arteriogram performed through the Infinity guide catheter in the proximal right vertebral artery demonstrated revascularization of the distal basilar artery with opacification of the bilaterally duplicated superior cerebellar artery branches, the anterior-inferior cerebellar arteries, and also of the left posterior  cerebral artery. The right PCA opacified only transiently due to inflow from the inferior circulation via the posterior communicating artery. A TICI 2C was achieved. A final control arteriogram performed through the Infinity guide catheter in the right subclavian artery demonstrates the right vertebral artery to be widely patent extra cranially and intracranially. An 8 French Angio-Seal closure device was deployed for hemostasis at the right groin puncture site after removal of the Pinnacle sheath. Distal pulses remained Dopplerable in both feet unchanged. CT brain demonstrated no evidence of intracranial hemorrhage. The patient was  left intubated due to her medical condition to protect the airway. She was then transferred to the PACU and then neuro ICU for post revascularization care. IMPRESSION: Endovascular complete revascularization of occluded distal basilar artery with 1 pass with a 4 mm x 40 mm Solitaire X retrieval device and contact aspiration achieving a TICI 2C revascularization. PLAN: Follow-up CT angiogram of the head and neck in six-months. Electronically Signed   By: Luanne Bras M.D.   On: 01/08/2022 07:44   IR CT Head Ltd  Result Date: 01/08/2022 INDICATION: History of acute change in mental status, and diffuse weakness and left sided nystagmus. 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 EXAM: 1. EMERGENT LARGE VESSEL OCCLUSION THROMBOLYSIS (POSTERIOR CIRCULATION) COMPARISON:  None Available. MEDICATIONS: Ancef 2 g IV antibiotic was administered within 1 hour of the procedure. ANESTHESIA/SEDATION: General anesthesia. CONTRAST:  Omnipaque 300 approximately 90 cc. FLUOROSCOPY TIME:  Fluoroscopy Time: 53 minutes 12 seconds (1411 mGy). COMPLICATIONS: None immediate. TECHNIQUE: Following a full explanation of the procedure along with the potential associated complications, an informed witnessed consent was obtained. The risks of intracranial hemorrhage of 10%, worsening neurological deficit, ventilator dependency, death and inability to revascularize were all reviewed in detail with the patient's husband. The patient was then put under general anesthesia by the Department of Anesthesiology at [facility]. The right groin was prepped and draped in the usual sterile fashion. Thereafter using modified Seldinger technique, transfemoral access into the right common femoral artery was obtained without difficulty. Over a 0.035 inch guidewire an 8 [place] 15 cm [place] Pinnacle sheath was inserted. Through this, and also over a 0.035 inch guidewire a 5.5 French 125 cm Berenstein support  catheter inside of an 088 95 cm Neuron Max sheath was advanced to the aortic arch region. The support catheter was then advanced into the right subclavian artery just proximal to the origin of the right vertebral artery. The guidewire was removed. Good aspiration was obtained from the hub of the Neuron Max sheath. Control arteriogram at the origin of the right vertebral artery was then performed centered extra cranially and intracranially. FINDINGS: The right vertebral artery origin demonstrated patency proximally, with a moderate kink distal to this. More distally, the vessel is seen to opacify to the cranial skull base. Contrast was seen extending into the proximal basilar artery. PROCEDURE: Attempts were made to advanced an intermediary catheter through the right vertebral artery distally with an 035 inch guidewire, 024 micro guidewire with an 027 microcatheter without success due to the extreme of the innominate artery origin from the aortic arch resulting in herniation of the platform into the aortic arch. Eventually access was obtained using a 125 cm 4 French sheath inside of an 80 cm Infinity guide catheter. The Infinity guide sheath could only be advanced into the proximal 1/3 of the right vertebral artery with further advancement resulting in herniation of the platform into the aortic  arch. The guide sheath was then exchanged for a 300 cm 014 inch BMW support guidewire in the V3 region of the right vertebral artery. An 021 160 cm microcatheter inside of a 132 cm 055 Zoom aspiration catheter was advanced without difficulty to the proximal basilar artery. The micro guidewire was then gently advanced without difficulty into the left posterior cerebral artery P2 region followed by the microcatheter. The guidewire was removed. Good aspiration was obtained from the hub of the microcatheter which was connected to continuous heparinized saline infusion. At this time, the Zoom aspiration catheter was advanced  without difficulty into the proximal P2 region. A 4 mm x 40 mm Solitaire X retrieval device was then deployed. Thereafter, the proximal aspiration at the hub of the Zoom aspiration catheter with a Penumbra pump, and at the hub of the [place] guide catheter with a 20 mL syringe, the combination of the retrieval device, the Zoom aspiration catheter was retrieved and removed. A control arteriogram performed through the Infinity guide catheter in the proximal right vertebral artery demonstrated revascularization of the distal basilar artery with opacification of the bilaterally duplicated superior cerebellar artery branches, the anterior-inferior cerebellar arteries, and also of the left posterior cerebral artery. The right PCA opacified only transiently due to inflow from the inferior circulation via the posterior communicating artery. A TICI 2C was achieved. A final control arteriogram performed through the Infinity guide catheter in the right subclavian artery demonstrates the right vertebral artery to be widely patent extra cranially and intracranially. An 8 French Angio-Seal closure device was deployed for hemostasis at the right groin puncture site after removal of the Pinnacle sheath. Distal pulses remained Dopplerable in both feet unchanged. CT brain demonstrated no evidence of intracranial hemorrhage. The patient was left intubated due to her medical condition to protect the airway. She was then transferred to the PACU and then neuro ICU for post revascularization care. IMPRESSION: Endovascular complete revascularization of occluded distal basilar artery with 1 pass with a 4 mm x 40 mm Solitaire X retrieval device and contact aspiration achieving a TICI 2C revascularization. PLAN: Follow-up CT angiogram of the head and neck in six-months. Electronically Signed   By: Luanne Bras M.D.   On: 01/08/2022 07:44   ECHOCARDIOGRAM COMPLETE  Result Date: 01/07/2022    ECHOCARDIOGRAM REPORT   Patient Name:    Cassandra Alexander Date of Exam: 01/07/2022 Medical Rec #:  [id number]     Height:       60.0 in Accession #:    [id number]    Weight:       112.9 lb Date of Birth:  December 08, 1945     BSA:          1.464 m Patient Age:    76 years      BP:           135/66 mmHg Patient Gender: F             HR:           74 bpm. Exam Location:  Inpatient Procedure: 2D Echo, Color Doppler and Cardiac Doppler Indications:    Stroke  History:        Patient has prior history of Echocardiogram examinations, most                 recent 09/26/2021. TIA; Risk Factors:Dyslipidemia.  Sonographer:    Memory Argue Referring Phys: [place]  1. Left ventricular ejection fraction, by estimation, is 60 to  65%. The left ventricle has normal function. The left ventricle has no regional wall motion abnormalities. Left ventricular diastolic parameters are indeterminate.  2. Right ventricular systolic function is normal. The right ventricular size is normal.  3. The mitral valve is normal in structure. Trivial mitral valve regurgitation. No evidence of mitral stenosis.  4. The aortic valve is tricuspid. Aortic valve regurgitation is not visualized. No aortic stenosis is present.  5. The inferior vena cava is normal in size with greater than 50% respiratory variability, suggesting right atrial pressure of 3 mmHg. FINDINGS  Left Ventricle: Left ventricular ejection fraction, by estimation, is 60 to 65%. The left ventricle has normal function. The left ventricle has no regional wall motion abnormalities. The left ventricular internal cavity size was normal in size. There is  no left ventricular hypertrophy. Left ventricular diastolic parameters are indeterminate. Right Ventricle: The right ventricular size is normal. No increase in right ventricular wall thickness. Right ventricular systolic function is normal. Left Atrium: Left atrial size was normal in size. Right Atrium: Right atrial size was normal in size. Pericardium: There is no  evidence of pericardial effusion. Presence of epicardial fat layer. Mitral Valve: The mitral valve is normal in structure. Trivial mitral valve regurgitation. No evidence of mitral valve stenosis. Tricuspid Valve: The tricuspid valve is normal in structure. Tricuspid valve regurgitation is not demonstrated. No evidence of tricuspid stenosis. Aortic Valve: The aortic valve is tricuspid. Aortic valve regurgitation is not visualized. No aortic stenosis is present. Aortic valve mean gradient measures 2.0 mmHg. Aortic valve peak gradient measures 4.5 mmHg. Aortic valve area, by VTI measures 2.67 cm. Pulmonic Valve: The pulmonic valve was normal in structure. Pulmonic valve regurgitation is not visualized. No evidence of pulmonic stenosis. Aorta: The aortic root is normal in size and structure. Venous: The inferior vena cava is normal in size with greater than 50% respiratory variability, suggesting right atrial pressure of 3 mmHg. IAS/Shunts: No atrial level shunt detected by color flow Doppler.  LEFT VENTRICLE PLAX 2D LVIDd:         4.20 cm   Diastology LVIDs:         3.00 cm   LV e' medial:    6.37 cm/s LV PW:         0.90 cm   LV E/e' medial:  10.8 LV IVS:        1.00 cm   LV e' lateral:   6.84 cm/s LVOT diam:     2.00 cm   LV E/e' lateral: 10.1 LV SV:         50 LV SV Index:   34 LVOT Area:     3.14 cm  RIGHT VENTRICLE RV S prime:     14.80 cm/s LEFT ATRIUM             Index        RIGHT ATRIUM          Index LA Vol (A2C):   27.5 ml 18.79 ml/m  RA Area:     7.19 cm LA Vol (A4C):   21.3 ml 14.55 ml/m  RA Volume:   12.10 ml 8.27 ml/m LA Biplane Vol: 26.6 ml 18.17 ml/m  AORTIC VALVE AV Area (Vmax):    2.64 cm AV Area (Vmean):   2.48 cm AV Area (VTI):     2.67 cm AV Vmax:           106.00 cm/s AV Vmean:  70.000 cm/s AV VTI:            0.186 m AV Peak Grad:      4.5 mmHg AV Mean Grad:      2.0 mmHg LVOT Vmax:         89.20 cm/s LVOT Vmean:        55.200 cm/s LVOT VTI:          0.158 m LVOT/AV VTI ratio:  0.85  AORTA Ao Root diam: 3.20 cm Ao Asc diam:  3.10 cm MITRAL VALVE MV Area (PHT): 3.65 cm    SHUNTS MV Decel Time: 208 msec    Systemic VTI:  0.16 m MV E velocity: 68.80 cm/s  Systemic Diam: 2.00 cm MV A velocity: 91.50 cm/s MV E/A ratio:  0.75 Kardie Tobb DO Electronically signed by Berniece Salines DO Signature Date/Time: 01/07/2022/2:15:40 PM    Final    MR BRAIN WO CONTRAST  Result Date: 01/07/2022 CLINICAL DATA:  Stroke follow-up EXAM: MRI HEAD WITHOUT CONTRAST TECHNIQUE: Multiplanar, multiecho pulse sequences of the brain and surrounding structures were obtained without intravenous contrast. COMPARISON:  CTA head/neck 01/06/22 FINDINGS: Brain: Acute infarcts in the bilateral cerebellar hemispheres (series 3, image 14). Small foci of acute infarcts are also seen in the pons (series 3, image 16). There is no evidence of hemorrhage. Sequela of moderate chronic microvascular ischemic change with a chronic infarct in the right centrum semiovale. There is incomplete suppression of fluid signal on FLAIR imaging in the bilateral parietal and occipital lobes. These areas do not demonstrate signal on susceptibility weighted imaging. No hydrocephalus. No extra-axial fluid collection. Vascular: There is diminished flow void at the tip of the basilar artery (series 6, image 10), nonspecific. Skull and upper cervical spine: Normal marrow signal. Sinuses/Orbits: Bilateral lens replacement. Mild mucosal thickening bilateral sphenoid sinuses. Other: None. IMPRESSION: 1. 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 No evidence of hemorrhage. 2. Diminished flow void at the tip of the basilar artery, nonspecific, but possibly secondary to recent intervention. 3. Incomplete suppression of fluid signal on FLAIR imaging in the bilateral parietal and occipital lobes, which is favored to be artifactual. Electronically Signed   By: Marin Roberts M.D.   On: 01/07/2022 10:00   DG Chest Port 1 View  Result Date:  01/07/2022 CLINICAL DATA:  Provided history: ETT.  Stroke. EXAM: PORTABLE CHEST 1 VIEW COMPARISON:  Prior chest radiographs 01/06/2022 and earlier. Chest CT 05/01/2021. FINDINGS: ET tube present with tip terminating 2.4 cm above the level of the carina. An enteric tube passes below the level left hemidiaphragm. The tip of the enteric tube or the incompletely imaged side-port is present at the level of the gastric body. A loop recorder device overlies the left chest. Heart size within normal limits. Aortic atherosclerosis. Persistent mild ill-defined opacity within the right lung base. Mild atelectasis within the left lung base. The 1.4 cm focus of nodular airspace consolidation demonstrated within the left upper lobe on the prior chest CT of 05/01/2021 is occult on the current examination. Mild chronic elevation of the right hemidiaphragm. No evidence of pleural effusion or pneumothorax. No acute bony abnormality identified. IMPRESSION: Support apparatus as described. Persistent mild ill-defined opacity within the right lung base. This has an appearance favoring atelectasis. However, aspiration/pneumonia cannot be excluded. Mild atelectasis within the left lung base. The 1.4 cm focus of nodular airspace consolidation demonstrated within the left upper lobe on the prior chest CT of 05/01/2021 is occult on the current examination.  Please refer to this prior examination for further description and for follow-up recommendations. Electronically Signed   By: Kellie Simmering D.O.   On: 01/07/2022 09:16   DG Abd Portable 1V  Result Date: 01/06/2022 CLINICAL DATA:  Enteric catheter placement EXAM: PORTABLE ABDOMEN - 1 VIEW COMPARISON:  None Available. FINDINGS: Frontal view of the lower chest and upper abdomen demonstrates enteric catheter passing below diaphragm tip and side port projecting over the gastric body. Lung bases are clear. Excreted contrast within the kidneys. Bowel gas pattern is unremarkable. IMPRESSION: 1.  Enteric catheter tip and side port projecting over gastric body. Electronically Signed   By: Randa Ngo M.D.   On: 01/06/2022 18:19   DG Chest Portable 1 View  Result Date: 01/06/2022 CLINICAL DATA:  Provided history: Altered mental status. Code stroke. EXAM: PORTABLE CHEST 1 VIEW COMPARISON:  Chest radiographs 02/28/2020 and earlier. Chest CT 05/01/2021. FINDINGS: A loop recorder device overlies the left chest. Heart size within normal limits. Aortic atherosclerosis. Mild atelectasis within the right lung base. The 1.4 cm focus of nodular airspace consolidation demonstrated within the left upper lobe on the prior chest CT of 05/01/2021 is occult on the current exam. Mild chronic elevation of the right hemidiaphragm. No evidence of pleural effusion or pneumothorax. No acute bony abnormality identified. Levocurvature of the thoracic spine. Thoracic spondylosis. IMPRESSION: Mild atelectasis within the right lung base. The 1.4 cm focus of nodular airspace consolidation demonstrated within the left upper lobe on the prior chest CT of 05/01/2021 is occult on the current examination. Please refer to this prior examination for further description and for follow-up recommendations. Aortic Atherosclerosis (ICD10-I70.0). Electronically Signed   By: Kellie Simmering D.O.   On: 01/06/2022 09:16   CT ANGIO HEAD NECK W WO CM W PERF (CODE STROKE)  Result Date: 01/06/2022 CLINICAL DATA:  Neuro deficit, acute, stroke suspected. EXAM: CT ANGIOGRAPHY HEAD AND NECK CT PERFUSION BRAIN TECHNIQUE: Multidetector CT imaging of the head and neck was performed using the standard protocol during bolus administration of intravenous contrast. Multiplanar CT image reconstructions and MIPs were obtained to evaluate the vascular anatomy. Carotid stenosis measurements (when applicable) are obtained utilizing NASCET criteria, using the distal internal carotid diameter as the denominator. Multiphase CT imaging of the brain was performed  following IV bolus contrast injection. Subsequent parametric perfusion maps were calculated using RAPID software. RADIATION DOSE REDUCTION: This exam was performed according to the departmental dose-optimization program which includes automated exposure control, adjustment of the mA and/or kV according to patient size and/or use of iterative reconstruction technique. CONTRAST:  127m OMNIPAQUE IOHEXOL 350 MG/ML SOLN COMPARISON:  Head CT same day. MRI 09/25/2021. CT angiography 09/25/2021. FINDINGS: CTA NECK FINDINGS Aortic arch: Aortic atherosclerosis. Innominate artery origin not included on the scan. Small left vertebral artery arises directly from the arch Right carotid system: Common carotid artery widely patent to the bifurcation. Carotid bifurcation is normal without soft or calcified plaque. Cervical ICA is normal. Left carotid system: Common carotid artery widely patent to the bifurcation. Very minimal plaque at the bifurcation but no stenosis or irregularity. Cervical ICA widely patent. Vertebral arteries: Dominant right vertebral artery arises normally from the subclavian artery and is widely patent through the cervical region to the foramen magnum. As noted above, left vertebral artery is a tiny vessel that arises from the arch in actually has a second origin at the left subclavian artery, the 2 branches coming together in the transverse foramen of the lower cervical spine. Beyond that,  the vessel is patent to the foramen magnum. Skeleton: Scoliosis without significant degenerative change. Other neck: No mass or lymphadenopathy. Upper chest: Emphysema without acute process. Review of the MIP images confirms the above findings CTA HEAD FINDINGS Anterior circulation: Right internal carotid artery widely patent through the skull base and siphon region. 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 Limited evaluation because of dense streak artifact the anterior and middle cerebral vessels are  patent. No large vessel occlusion or proximal stenosis. Left internal carotid arteries widely patent through the skull base and siphon region. The anterior and middle cerebral vessels are normal without aneurysm or stenosis. Posterior circulation: Dominant right vertebral artery is widely patent to the basilar artery. Right PICA shows flow. Non dominant left vertebral artery is occluded in the V4 segment. The basilar artery shows flow within both anterior inferior cerebellar arteries. There is an embolus at the basilar tip. Flow is seen within both posterior cerebral arteries. Venous sinuses: Patent and normal Anatomic variants: None significant otherwise Review of the MIP images confirms the above findings CT Brain Perfusion Findings: ASPECTS: 10 CBF (<30%) Volume: 5m Perfusion (Tmax>6.0s) volume: 046mMismatch Volume: 26m24mnfarction Location:None demonstrated IMPRESSION: 1. 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 Basilar artery shows flow proximally but there is an embolus at the basilar tip. Some flow is seen within both posterior cerebral arteries. 2. 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 Limited evaluation because of dense streak artifact. No anterior circulation large vessel occlusion or proximal stenosis. 3. No carotid bifurcation disease. 4. No evidence of infarction by CT perfusion imaging. 5. Aortic atherosclerosis. 6. Emphysema without acute process. 7. The left vertebral artery has 2 origins, 1 arising directly from the aortic arch and a second origin at the left subclavian artery, the 2 branches coming together in the transverse foramen of the lower cervical spine. Beyond that, the vessel is patent to the foramen magnum. Left V4 occlusion described above. Aortic Atherosclerosis (ICD10-I70.0) and Emphysema (ICD10-J43.9). Case discussed with Dr. StaQuinn Axering performance. Basilar tip embolic occlusion communicated at approximally 0905 hours  via [employer]. Electronically Signed   By: MarNelson ChimesD.   On: 01/06/2022 09:07   CT HEAD CODE STROKE WO CONTRAST  Result Date: 01/06/2022 CLINICAL DATA:  Code stroke.  Neuro deficit, acute, stroke suspected EXAM: CT HEAD WITHOUT CONTRAST TECHNIQUE: Contiguous axial images were obtained from the base of the skull through the vertex without intravenous contrast. RADIATION DOSE REDUCTION: This exam was performed according to the departmental dose-optimization program which includes automated exposure control, adjustment of the mA and/or kV according to patient size and/or use of iterative reconstruction technique. COMPARISON:  CT 09/25/2021. FINDINGS: Brain: No evidence of acute large vascular territory infarction, hemorrhage, hydrocephalus, extra-axial collection or mass lesion/mass effect. Similar small scattered remote infarcts. Vascular: Embolization coils in the right paraclinoid ICA. Skull: No acute fracture. Sinuses/Orbits: No acute finding. Other: No mastoid effusions. AS[facility]S [facility] total score (0-10 with 10 being normal): 10. IMPRESSION: 1. No evidence of acute intracranial abnormality. 2. ASPECTS is 10. Code stroke imaging results were communicated on 01/06/2022 at 8:30 am to [facility] text p[facility]. Electronically Signed   By: FreMargaretha SheffieldD.   On: 01/06/2022 08:30   CUP PACEART REMOTE DEVICE CHECK  Result Date: 01/05/2022 ILR summary report received. Battery status OK. Normal device function. No new symptom, tachy, brady, or pause episodes. No new  AF episodes. Monthly summary reports and ROV/PRN LA   Labs:  CBC: Recent Labs    09/25/21 1009 09/25/21 1033 01/06/22 0818 01/06/22 0823 01/06/22 1220 01/07/22 0428  WBC 6.1  --  6.9  --   --  10.3  HGB 14.4   < > 12.9 13.3 12.9 12.2  HCT 45.7   < > 40.3 39.0 38.0 35.8*  PLT 312  --  204  --   --  234   < > = values in this interval not displayed.    COAGS: Recent Labs     09/25/21 1009 01/06/22 0818  INR 1.0 1.0  APTT 28 23*    BMP: Recent Labs    09/25/21 1009 09/25/21 1033 01/06/22 0818 01/06/22 0823 01/06/22 1220 01/07/22 0428 01/08/22 0522  NA 141   < > 139 141 137 140 140  K 4.3   < > 4.0 4.1 3.5 3.5 3.3*  CL 105   < > 103 103  --  105 109  CO2 28  --  27  --   --  25 24  GLUCOSE 98   < > 156* 149*  --  97 127*  BUN 8   < > 10 11  --  6* 6*  CALCIUM 9.3  --  8.8*  --   --  8.7* 8.5*  CREATININE 0.67   < > 0.66 0.60  --  0.53 0.56  GFRNONAA >60  --  >60  --   --  >60 >60   < > = values in this interval not displayed.    LIVER FUNCTION TESTS: Recent Labs    09/25/21 1009 01/06/22 0818 01/07/22 0428  BILITOT 0.7 0.8 0.6  AST 19 19 19   ALT 12 21 17   ALKPHOS 55 53 51  PROT 7.0 6.1* 5.7*  ALBUMIN 4.1 3.7 3.4*    Assessment and Plan:  S/p revascularization of basilar artery occlusion  Pt is alert but unsure as to how she got to hospital.  She exhibits difficulty finding words.   Pt to follow-up with Dr. Estanislado Pandy in 6 months Further care per primary care team   Electronically Signed: Tyson Alias, NP 01/08/2022, 10:21 AM   I spent a total of 15 Minutes at the the patient's bedside AND on the patient's hospital floor or unit, greater than 50% of which was counseling/coordinating care for revascularization of basilar artery.

## 2022-01-08 NOTE — Discharge Summary (Addendum)
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 s/p mechanical thrombectomy with TICI2c revascularization  Active Problems:   Acute ischemic stroke [facility])   Occlusion of distal basilar artery   Respiratory insufficiency Brainstem and cerebellar strokes Dysarthria Facial weakness 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        Medication List     STOP taking these medications    aspirin EC 81 MG tablet Replaced by: aspirin 81 MG chewable tablet   HYDROcodone-acetaminophen 10-325 MG tablet Commonly known as: NORCO   pantoprazole 40 MG tablet Commonly known as: PROTONIX       TAKE these medications    aspirin 81 MG chewable tablet Chew 1 tablet (81 mg total) by mouth daily. Start taking on: January 09, 2022 Replaces: aspirin EC 81 MG tablet   atorvastatin 80 MG tablet Commonly known as: LIPITOR Take 1 tablet (80 mg total) by mouth daily.   citalopram 10  MG tablet Commonly known as: CELEXA Take 10 mg by mouth at bedtime.   clopidogrel 75 MG tablet Commonly known as: PLAVIX Take 1 tablet (75 mg total) by mouth daily. Start taking on: January 09, 2022   imipramine 50 MG tablet Commonly known as: TOFRANIL Take 2 tablets by mouth at bedtime.   lisinopril 10 MG tablet Commonly known as: ZESTRIL Take 10 mg by mouth daily.   mesalamine 1.2 g EC tablet Commonly known as: LIALDA Take 2.4 g by mouth daily with breakfast.   OCEANIC-STROKE asundexian or placebo 50 mg tablet Take 1 tablet (50 mg total) by mouth daily. For Investigational Use Only. Take 1 tablet my mouth daily at the same time each day (preferably in the morning). Tablet should be swallowed whole with water; it CANNOT be crushed or broken. Please bring your bottle with you to each research visit. Please contact [facility] for any questions or concerns.       LABORATORY STUDIES CBC    Component Value Date/Time   WBC 10.3 01/07/2022 0428   RBC 3.87 01/07/2022 0428   HGB 12.2 01/07/2022 0428   HCT 35.8 (L) 01/07/2022 0428   PLT 234 01/07/2022 0428   MCV 92.5 01/07/2022 0428   MCH 31.5 01/07/2022 0428   MCHC 34.1 01/07/2022 0428   RDW 13.8 01/07/2022 0428   LYMPHSABS 1.9 01/07/2022 0428   MONOABS 1.1 (H) 01/07/2022 0428   EOSABS 0.0 01/07/2022 0428   BASOSABS 0.0 01/07/2022 0428   CMP  Component Value Date/Time   NA 140 01/08/2022 0522   K 3.3 (L) 01/08/2022 0522   CL 109 01/08/2022 0522   CO2 24 01/08/2022 0522   GLUCOSE 127 (H) 01/08/2022 0522   GLUCOSE 94 01/26/2006 1026   BUN 6 (L) 01/08/2022 0522   CREATININE 0.56 01/08/2022 0522   CALCIUM 8.5 (L) 01/08/2022 0522   PROT 5.7 (L) 01/07/2022 0428   ALBUMIN 3.4 (L) 01/07/2022 0428   AST 19 01/07/2022 0428   ALT 17 01/07/2022 0428   ALKPHOS 51 01/07/2022 0428   BILITOT 0.6 01/07/2022 0428   GFRNONAA >60 01/08/2022 0522   GFRAA >60 05/17/2018 0516   COAGS Lab Results  Component  Value Date   INR 1.0 01/06/2022   INR 1.0 09/25/2021   INR 1.05 07/22/2015   Lipid Panel    Component Value Date/Time   CHOL 124 01/07/2022 0527   TRIG 77 01/07/2022 0527   TRIG 67 01/26/2006 1026   HDL 62 01/07/2022 0527   CHOLHDL 2.0 01/07/2022 0527   VLDL 15 01/07/2022 0527   LDLCALC 47 01/07/2022 0527   HgbA1C  Lab Results  Component Value Date   HGBA1C 6.0 (H) 01/07/2022   Urinalysis    Component Value Date/Time   COLORURINE STRAW (A) 01/06/2022 1749   APPEARANCEUR CLEAR 01/06/2022 1749   LABSPEC 1.032 (H) 01/06/2022 1749   PHURINE 7.0 01/06/2022 1749   GLUCOSEU NEGATIVE 01/06/2022 1749   HGBUR NEGATIVE 01/06/2022 1749   HGBUR negative 03/31/2009 1031   BILIRUBINUR NEGATIVE 01/06/2022 1749   BILIRUBINUR 1+ 06/09/2010 0000   KETONESUR 5 (A) 01/06/2022 1749   PROTEINUR NEGATIVE 01/06/2022 1749   UROBILINOGEN 0.2 06/09/2010 0000   UROBILINOGEN 0.2 03/31/2009 1031   NITRITE NEGATIVE 01/06/2022 1749   LEUKOCYTESUR NEGATIVE 01/06/2022 1749   Urine Drug Screen     Component Value Date/Time   LABOPIA POSITIVE (A) 01/06/2022 1749   COCAINSCRNUR NONE DETECTED 01/06/2022 1749   LABBENZ POSITIVE (A) 01/06/2022 1749   AMPHETMU NONE DETECTED 01/06/2022 1749   THCU NONE DETECTED 01/06/2022 1749   LABBARB NONE DETECTED 01/06/2022 1749    Alcohol Level    Component Value Date/Time   ETH <10 01/06/2022 0818   SIGNIFICANT DIAGNOSTIC STUDIES IR PERCUTANEOUS ART THROMBECTOMY/INFUSION INTRACRANIAL INC DIAG ANGIO  Result Date: 01/08/2022 INDICATION: History of acute change in mental status, and diffuse weakness and left sided nystagmus. 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 EXAM: 1. EMERGENT LARGE VESSEL OCCLUSION THROMBOLYSIS (POSTERIOR CIRCULATION) COMPARISON:  None Available. MEDICATIONS: Ancef 2 g IV antibiotic was administered within 1 hour of the procedure. ANESTHESIA/SEDATION: General anesthesia. CONTRAST:  Omnipaque 300  approximately 90 cc. FLUOROSCOPY TIME:  Fluoroscopy Time: 53 minutes 12 seconds (1411 mGy). COMPLICATIONS: None immediate. TECHNIQUE: Following a full explanation of the procedure along with the potential associated complications, an informed witnessed consent was obtained. The risks of intracranial hemorrhage of 10%, worsening neurological deficit, ventilator dependency, death and inability to revascularize were all reviewed in detail with the patient's husband. The patient was then put under general anesthesia by the Department of Anesthesiology at [facility]. The right groin was prepped and draped in the usual sterile fashion. Thereafter using modified Seldinger technique, transfemoral access into the right common femoral artery was obtained without difficulty. Over a 0.035 inch guidewire an 8 [place] 15 cm [place] Pinnacle sheath was inserted. Through this, and also over a 0.035 inch guidewire a 5.5 French 125 cm Berenstein support catheter inside of  an 088 95 cm Neuron Max sheath was advanced to the aortic arch region. The support catheter was then advanced into the right subclavian artery just proximal to the origin of the right vertebral artery. The guidewire was removed. Good aspiration was obtained from the hub of the Neuron Max sheath. Control arteriogram at the origin of the right vertebral artery was then performed centered extra cranially and intracranially. FINDINGS: The right vertebral artery origin demonstrated patency proximally, with a moderate kink distal to this. More distally, the vessel is seen to opacify to the cranial skull base. Contrast was seen extending into the proximal basilar artery. PROCEDURE: Attempts were made to advanced an intermediary catheter through the right vertebral artery distally with an 035 inch guidewire, 024 micro guidewire with an 027 microcatheter without success due to the extreme of the innominate artery origin from the aortic arch resulting in herniation of  the platform into the aortic arch. Eventually access was obtained using a 125 cm 4 French sheath inside of an 80 cm Infinity guide catheter. The Infinity guide sheath could only be advanced into the proximal 1/3 of the right vertebral artery with further advancement resulting in herniation of the platform into the aortic arch. The guide sheath was then exchanged for a 300 cm 014 inch BMW support guidewire in the V3 region of the right vertebral artery. An 021 160 cm microcatheter inside of a 132 cm 055 Zoom aspiration catheter was advanced without difficulty to the proximal basilar artery. The micro guidewire was then gently advanced without difficulty into the left posterior cerebral artery P2 region followed by the microcatheter. The guidewire was removed. Good aspiration was obtained from the hub of the microcatheter which was connected to continuous heparinized saline infusion. At this time, the Zoom aspiration catheter was advanced without difficulty into the proximal P2 region. A 4 mm x 40 mm Solitaire X retrieval device was then deployed. Thereafter, the proximal aspiration at the hub of the Zoom aspiration catheter with a Penumbra pump, and at the hub of the [place] guide catheter with a 20 mL syringe, the combination of the retrieval device, the Zoom aspiration catheter was retrieved and removed. A control arteriogram performed through the Infinity guide catheter in the proximal right vertebral artery demonstrated revascularization of the distal basilar artery with opacification of the bilaterally duplicated superior cerebellar artery branches, the anterior-inferior cerebellar arteries, and also of the left posterior cerebral artery. The right PCA opacified only transiently due to inflow from the inferior circulation via the posterior communicating artery. A TICI 2C was achieved. A final control arteriogram performed through the Infinity guide catheter in the right subclavian artery demonstrates the right  vertebral artery to be widely patent extra cranially and intracranially. An 8 French Angio-Seal closure device was deployed for hemostasis at the right groin puncture site after removal of the Pinnacle sheath. Distal pulses remained Dopplerable in both feet unchanged. CT brain demonstrated no evidence of intracranial hemorrhage. The patient was left intubated due to her medical condition to protect the airway. She was then transferred to the PACU and then neuro ICU for post revascularization care. IMPRESSION: Endovascular complete revascularization of occluded distal basilar artery with 1 pass with a 4 mm x 40 mm Solitaire X retrieval device and contact aspiration achieving a TICI 2C revascularization. PLAN: Follow-up CT angiogram of the head and neck in six-months. Electronically Signed   By: Luanne Bras M.D.   On: 01/08/2022 07:44   IR CT Head Ltd  Result Date:  01/08/2022 INDICATION: History of acute change in mental status, and diffuse weakness and left sided nystagmus. 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 EXAM: 1. EMERGENT LARGE VESSEL OCCLUSION THROMBOLYSIS (POSTERIOR CIRCULATION) COMPARISON:  None Available. MEDICATIONS: Ancef 2 g IV antibiotic was administered within 1 hour of the procedure. ANESTHESIA/SEDATION: General anesthesia. CONTRAST:  Omnipaque 300 approximately 90 cc. FLUOROSCOPY TIME:  Fluoroscopy Time: 53 minutes 12 seconds (1411 mGy). COMPLICATIONS: None immediate. TECHNIQUE: Following a full explanation of the procedure along with the potential associated complications, an informed witnessed consent was obtained. The risks of intracranial hemorrhage of 10%, worsening neurological deficit, ventilator dependency, death and inability to revascularize were all reviewed in detail with the patient's husband. The patient was then put under general anesthesia by the Department of Anesthesiology at [facility]. The right groin was prepped and  draped in the usual sterile fashion. Thereafter using modified Seldinger technique, transfemoral access into the right common femoral artery was obtained without difficulty. Over a 0.035 inch guidewire an 8 [place] 15 cm [place] Pinnacle sheath was inserted. Through this, and also over a 0.035 inch guidewire a 5.5 French 125 cm Berenstein support catheter inside of an 088 95 cm Neuron Max sheath was advanced to the aortic arch region. The support catheter was then advanced into the right subclavian artery just proximal to the origin of the right vertebral artery. The guidewire was removed. Good aspiration was obtained from the hub of the Neuron Max sheath. Control arteriogram at the origin of the right vertebral artery was then performed centered extra cranially and intracranially. FINDINGS: The right vertebral artery origin demonstrated patency proximally, with a moderate kink distal to this. More distally, the vessel is seen to opacify to the cranial skull base. Contrast was seen extending into the proximal basilar artery. PROCEDURE: Attempts were made to advanced an intermediary catheter through the right vertebral artery distally with an 035 inch guidewire, 024 micro guidewire with an 027 microcatheter without success due to the extreme of the innominate artery origin from the aortic arch resulting in herniation of the platform into the aortic arch. Eventually access was obtained using a 125 cm 4 French sheath inside of an 80 cm Infinity guide catheter. The Infinity guide sheath could only be advanced into the proximal 1/3 of the right vertebral artery with further advancement resulting in herniation of the platform into the aortic arch. The guide sheath was then exchanged for a 300 cm 014 inch BMW support guidewire in the V3 region of the right vertebral artery. An 021 160 cm microcatheter inside of a 132 cm 055 Zoom aspiration catheter was advanced without difficulty to the proximal basilar artery. The micro  guidewire was then gently advanced without difficulty into the left posterior cerebral artery P2 region followed by the microcatheter. The guidewire was removed. Good aspiration was obtained from the hub of the microcatheter which was connected to continuous heparinized saline infusion. At this time, the Zoom aspiration catheter was advanced without difficulty into the proximal P2 region. A 4 mm x 40 mm Solitaire X retrieval device was then deployed. Thereafter, the proximal aspiration at the hub of the Zoom aspiration catheter with a Penumbra pump, and at the hub of the [place] guide catheter with a 20 mL syringe, the combination of the retrieval device, the Zoom aspiration catheter was retrieved and removed. A control arteriogram performed through the Infinity guide catheter in the proximal right vertebral artery demonstrated revascularization of the distal basilar  artery with opacification of the bilaterally duplicated superior cerebellar artery branches, the anterior-inferior cerebellar arteries, and also of the left posterior cerebral artery. The right PCA opacified only transiently due to inflow from the inferior circulation via the posterior communicating artery. A TICI 2C was achieved. A final control arteriogram performed through the Infinity guide catheter in the right subclavian artery demonstrates the right vertebral artery to be widely patent extra cranially and intracranially. An 8 French Angio-Seal closure device was deployed for hemostasis at the right groin puncture site after removal of the Pinnacle sheath. Distal pulses remained Dopplerable in both feet unchanged. CT brain demonstrated no evidence of intracranial hemorrhage. The patient was left intubated due to her medical condition to protect the airway. She was then transferred to the PACU and then neuro ICU for post revascularization care. IMPRESSION: Endovascular complete revascularization of occluded distal basilar artery with 1 pass with a  4 mm x 40 mm Solitaire X retrieval device and contact aspiration achieving a TICI 2C revascularization. PLAN: Follow-up CT angiogram of the head and neck in six-months. Electronically Signed   By: Luanne Bras M.D.   On: 01/08/2022 07:44   ECHOCARDIOGRAM COMPLETE  Result Date: 01/07/2022    ECHOCARDIOGRAM REPORT   Patient Name:   Cassandra Alexander Date of Exam: 01/07/2022 Medical Rec #:  [id number]     Height:       60.0 in Accession #:    [id number]    Weight:       112.9 lb Date of Birth:  [birth year]     BSA:          1.464 m Patient Age:    41 years      BP:           135/66 mmHg Patient Gender: F             HR:           74 bpm. Exam Location:  Inpatient Procedure: 2D Echo, Color Doppler and Cardiac Doppler Indications:    Stroke  History:        Patient has prior history of Echocardiogram examinations, most                 recent 09/26/2021. TIA; Risk Factors:Dyslipidemia.  Sonographer:    Memory Argue Referring Phys: Evans  1. Left ventricular ejection fraction, by estimation, is 60 to 65%. The left ventricle has normal function. The left ventricle has no regional wall motion abnormalities. Left ventricular diastolic parameters are indeterminate.  2. Right ventricular systolic function is normal. The right ventricular size is normal.  3. The mitral valve is normal in structure. Trivial mitral valve regurgitation. No evidence of mitral stenosis.  4. The aortic valve is tricuspid. Aortic valve regurgitation is not visualized. No aortic stenosis is present.  5. The inferior vena cava is normal in size with greater than 50% respiratory variability, suggesting right atrial pressure of 3 mmHg. FINDINGS  Left Ventricle: Left ventricular ejection fraction, by estimation, is 60 to 65%. The left ventricle has normal function. The left ventricle has no regional wall motion abnormalities. The left ventricular internal cavity size was normal in size. There is  no left ventricular  hypertrophy. Left ventricular diastolic parameters are indeterminate. Right Ventricle: The right ventricular size is normal. No increase in right ventricular wall thickness. Right ventricular systolic function is normal. Left Atrium: Left atrial size was normal in size. Right Atrium: Right atrial size was normal in  size. Pericardium: There is no evidence of pericardial effusion. Presence of epicardial fat layer. Mitral Valve: The mitral valve is normal in structure. Trivial mitral valve regurgitation. No evidence of mitral valve stenosis. Tricuspid Valve: The tricuspid valve is normal in structure. Tricuspid valve regurgitation is not demonstrated. No evidence of tricuspid stenosis. Aortic Valve: The aortic valve is tricuspid. Aortic valve regurgitation is not visualized. No aortic stenosis is present. Aortic valve mean gradient measures 2.0 mmHg. Aortic valve peak gradient measures 4.5 mmHg. Aortic valve area, by VTI measures 2.67 cm. Pulmonic Valve: The pulmonic valve was normal in structure. Pulmonic valve regurgitation is not visualized. No evidence of pulmonic stenosis. Aorta: The aortic root is normal in size and structure. Venous: The inferior vena cava is normal in size with greater than 50% respiratory variability, suggesting right atrial pressure of 3 mmHg. IAS/Shunts: No atrial level shunt detected by color flow Doppler.  LEFT VENTRICLE PLAX 2D LVIDd:         4.20 cm   Diastology LVIDs:         3.00 cm   LV e' medial:    6.37 cm/s LV PW:         0.90 cm   LV E/e' medial:  10.8 LV IVS:        1.00 cm   LV e' lateral:   6.84 cm/s LVOT diam:     2.00 cm   LV E/e' lateral: 10.1 LV SV:         50 LV SV Index:   34 LVOT Area:     3.14 cm  RIGHT VENTRICLE RV S prime:     14.80 cm/s LEFT ATRIUM             Index        RIGHT ATRIUM          Index LA Vol (A2C):   27.5 ml 18.79 ml/m  RA Area:     7.19 cm LA Vol (A4C):   21.3 ml 14.55 ml/m  RA Volume:   12.10 ml 8.27 ml/m LA Biplane Vol: 26.6 ml 18.17 ml/m   AORTIC VALVE AV Area (Vmax):    2.64 cm AV Area (Vmean):   2.48 cm AV Area (VTI):     2.67 cm AV Vmax:           106.00 cm/s AV Vmean:          70.000 cm/s AV VTI:            0.186 m AV Peak Grad:      4.5 mmHg AV Mean Grad:      2.0 mmHg LVOT Vmax:         89.20 cm/s LVOT Vmean:        55.200 cm/s LVOT VTI:          0.158 m LVOT/AV VTI ratio: 0.85  AORTA Ao Root diam: 3.20 cm Ao Asc diam:  3.10 cm MITRAL VALVE MV Area (PHT): 3.65 cm    SHUNTS MV Decel Time: 208 msec    Systemic VTI:  0.16 m MV E velocity: 68.80 cm/s  Systemic Diam: 2.00 cm MV A velocity: 91.50 cm/s MV E/A ratio:  0.75 Kardie Tobb DO Electronically signed by Berniece Salines DO Signature Date/Time: 01/07/2022/2:15:40 PM    Final    MR BRAIN WO CONTRAST  Result Date: 01/07/2022 CLINICAL DATA:  Stroke follow-up EXAM: MRI HEAD WITHOUT CONTRAST TECHNIQUE: Multiplanar, multiecho pulse sequences of the brain and surrounding structures were obtained  without intravenous contrast. COMPARISON:  CTA head/neck 01/06/22 FINDINGS: Brain: Acute infarcts in the bilateral cerebellar hemispheres (series 3, image 14). Small foci of acute infarcts are also seen in the pons (series 3, image 16). There is no evidence of hemorrhage. Sequela of moderate chronic microvascular ischemic change with a chronic infarct in the right centrum semiovale. There is incomplete suppression of fluid signal on FLAIR imaging in the bilateral parietal and occipital lobes. These areas do not demonstrate signal on susceptibility weighted imaging. No hydrocephalus. No extra-axial fluid collection. Vascular: There is diminished flow void at the tip of the basilar artery (series 6, image 10), nonspecific. Skull and upper cervical spine: Normal marrow signal. Sinuses/Orbits: Bilateral lens replacement. Mild mucosal thickening bilateral sphenoid sinuses. Other: None. IMPRESSION: 1. 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 No evidence of hemorrhage. 2. Diminished flow  void at the tip of the basilar artery, nonspecific, but possibly secondary to recent intervention. 3. Incomplete suppression of fluid signal on FLAIR imaging in the bilateral parietal and occipital lobes, which is favored to be artifactual. Electronically Signed   By: Marin Roberts M.D.   On: 01/07/2022 10:00   DG Chest Port 1 View  Result Date: 01/07/2022 CLINICAL DATA:  Provided history: ETT.  Stroke. EXAM: PORTABLE CHEST 1 VIEW COMPARISON:  Prior chest radiographs 01/06/2022 and earlier. Chest CT 05/01/2021. FINDINGS: ET tube present with tip terminating 2.4 cm above the level of the carina. An enteric tube passes below the level left hemidiaphragm. The tip of the enteric tube or the incompletely imaged side-port is present at the level of the gastric body. A loop recorder device overlies the left chest. Heart size within normal limits. Aortic atherosclerosis. Persistent mild ill-defined opacity within the right lung base. Mild atelectasis within the left lung base. The 1.4 cm focus of nodular airspace consolidation demonstrated within the left upper lobe on the prior chest CT of 05/01/2021 is occult on the current examination. Mild chronic elevation of the right hemidiaphragm. No evidence of pleural effusion or pneumothorax. No acute bony abnormality identified. IMPRESSION: Support apparatus as described. Persistent mild ill-defined opacity within the right lung base. This has an appearance favoring atelectasis. However, aspiration/pneumonia cannot be excluded. Mild atelectasis within the left lung base. The 1.4 cm focus of nodular airspace consolidation demonstrated within the left upper lobe on the prior chest CT of 05/01/2021 is occult on the current examination. Please refer to this prior examination for further description and for follow-up recommendations. Electronically Signed   By: Kellie Simmering D.O.   On: 01/07/2022 09:16   DG Abd Portable 1V  Result Date: 01/06/2022 CLINICAL DATA:  Enteric  catheter placement EXAM: PORTABLE ABDOMEN - 1 VIEW COMPARISON:  None Available. FINDINGS: Frontal view of the lower chest and upper abdomen demonstrates enteric catheter passing below diaphragm tip and side port projecting over the gastric body. Lung bases are clear. Excreted contrast within the kidneys. Bowel gas pattern is unremarkable. IMPRESSION: 1. Enteric catheter tip and side port projecting over gastric body. Electronically Signed   By: Randa Ngo M.D.   On: 01/06/2022 18:19   DG Chest Portable 1 View  Result Date: 01/06/2022 CLINICAL DATA:  Provided history: Altered mental status. Code stroke. EXAM: PORTABLE CHEST 1 VIEW COMPARISON:  Chest radiographs 02/28/2020 and earlier. Chest CT 05/01/2021. FINDINGS: A loop recorder device overlies the left chest. Heart size within normal limits. Aortic atherosclerosis. Mild atelectasis within the right lung base. The 1.4 cm focus of nodular airspace consolidation  demonstrated within the left upper lobe on the prior chest CT of 05/01/2021 is occult on the current exam. Mild chronic elevation of the right hemidiaphragm. No evidence of pleural effusion or pneumothorax. No acute bony abnormality identified. Levocurvature of the thoracic spine. Thoracic spondylosis. IMPRESSION: Mild atelectasis within the right lung base. The 1.4 cm focus of nodular airspace consolidation demonstrated within the left upper lobe on the prior chest CT of 05/01/2021 is occult on the current examination. Please refer to this prior examination for further description and for follow-up recommendations. Aortic Atherosclerosis (ICD10-I70.0). Electronically Signed   By: Kellie Simmering D.O.   On: 01/06/2022 09:16   CT ANGIO HEAD NECK W WO CM W PERF (CODE STROKE)  Result Date: 01/06/2022 CLINICAL DATA:  Neuro deficit, acute, stroke suspected. EXAM: CT ANGIOGRAPHY HEAD AND NECK CT PERFUSION BRAIN TECHNIQUE: Multidetector CT imaging of the head and neck was performed using the standard  protocol during bolus administration of intravenous contrast. Multiplanar CT image reconstructions and MIPs were obtained to evaluate the vascular anatomy. Carotid stenosis measurements (when applicable) are obtained utilizing NASCET criteria, using the distal internal carotid diameter as the denominator. Multiphase CT imaging of the brain was performed following IV bolus contrast injection. Subsequent parametric perfusion maps were calculated using RAPID software. RADIATION DOSE REDUCTION: This exam was performed according to the departmental dose-optimization program which includes automated exposure control, adjustment of the mA and/or kV according to patient size and/or use of iterative reconstruction technique. CONTRAST:  149m OMNIPAQUE IOHEXOL 350 MG/ML SOLN COMPARISON:  Head CT same day. MRI 09/25/2021. CT angiography 09/25/2021. FINDINGS: CTA NECK FINDINGS Aortic arch: Aortic atherosclerosis. Innominate artery origin not included on the scan. Small left vertebral artery arises directly from the arch Right carotid system: Common carotid artery widely patent to the bifurcation. Carotid bifurcation is normal without soft or calcified plaque. Cervical ICA is normal. Left carotid system: Common carotid artery widely patent to the bifurcation. Very minimal plaque at the bifurcation but no stenosis or irregularity. Cervical ICA widely patent. Vertebral arteries: Dominant right vertebral artery arises normally from the subclavian artery and is widely patent through the cervical region to the foramen magnum. As noted above, left vertebral artery is a tiny vessel that arises from the arch in actually has a second origin at the left subclavian artery, the 2 branches coming together in the transverse foramen of the lower cervical spine. Beyond that, the vessel is patent to the foramen magnum. Skeleton: Scoliosis without significant degenerative change. Other neck: No mass or lymphadenopathy. Upper chest: Emphysema  without acute process. Review of the MIP images confirms the above findings CTA HEAD FINDINGS Anterior circulation: Right internal carotid artery widely patent through the skull base and siphon region. 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 Limited evaluation because of dense streak artifact the anterior and middle cerebral vessels are patent. No large vessel occlusion or proximal stenosis. Left internal carotid arteries widely patent through the skull base and siphon region. The anterior and middle cerebral vessels are normal without aneurysm or stenosis. Posterior circulation: Dominant right vertebral artery is widely patent to the basilar artery. Right PICA shows flow. Non dominant left vertebral artery is occluded in the V4 segment. The basilar artery shows flow within both anterior inferior cerebellar arteries. There is an embolus at the basilar tip. Flow is seen within both posterior cerebral arteries. Venous sinuses: Patent and normal Anatomic variants: None significant otherwise Review of the MIP images confirms the above findings CT Brain  Perfusion Findings: ASPECTS: 10 CBF (<30%) Volume: 8m Perfusion (Tmax>6.0s) volume: 063mMismatch Volume: 3m29mnfarction Location:None demonstrated IMPRESSION: 1. 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 Basilar artery shows flow proximally but there is an embolus at the basilar tip. Some flow is seen within both posterior cerebral arteries. 2. 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 Limited evaluation because of dense streak artifact. No anterior circulation large vessel occlusion or proximal stenosis. 3. No carotid bifurcation disease. 4. No evidence of infarction by CT perfusion imaging. 5. Aortic atherosclerosis. 6. Emphysema without acute process. 7. The left vertebral artery has 2 origins, 1 arising directly from the aortic arch and a second origin at the left subclavian artery, the 2  branches coming together in the transverse foramen of the lower cervical spine. Beyond that, the vessel is patent to the foramen magnum. Left V4 occlusion described above. Aortic Atherosclerosis (ICD10-I70.0) and Emphysema (ICD10-J43.9). Case discussed with Dr. StaQuinn Axering performance. Basilar tip embolic occlusion communicated at approximally 0905 hours via Amion. Electronically Signed   By: MarNelson ChimesD.   On: 01/06/2022 09:07   CT HEAD CODE STROKE WO CONTRAST  Result Date: 01/06/2022 CLINICAL DATA:  Code stroke.  Neuro deficit, acute, stroke suspected EXAM: CT HEAD WITHOUT CONTRAST TECHNIQUE: Contiguous axial images were obtained from the base of the skull through the vertex without intravenous contrast. RADIATION DOSE REDUCTION: This exam was performed according to the departmental dose-optimization program which includes automated exposure control, adjustment of the mA and/or kV according to patient size and/or use of iterative reconstruction technique. COMPARISON:  CT 09/25/2021. FINDINGS: Brain: No evidence of acute large vascular territory infarction, hemorrhage, hydrocephalus, extra-axial collection or mass lesion/mass effect. Similar small scattered remote infarcts. Vascular: Embolization coils in the right paraclinoid ICA. Skull: No acute fracture. Sinuses/Orbits: No acute finding. Other: No mastoid effusions. ASPECTS ([facility] Program Early CT Score) total score (0-10 with 10 being normal): 10. IMPRESSION: 1. No evidence of acute intracranial abnormality. 2. ASPECTS is 10. Code stroke imaging results were communicated on 01/06/2022 at 8:30 am to provider [facility] secure text paging. Electronically Signed   By: FreMargaretha SheffieldD.   On: 01/06/2022 08:30   CUP PACEART REMOTE DEVICE CHECK  Result Date: 01/05/2022 ILR summary report received. Battery status OK. Normal device function. No new symptom, tachy, brady, or pause episodes. No new AF episodes. 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  Her last known well was 2230 the night PTA 10/24.  Husband called to wake her up on 10/25 and she said "okay" and then was mumbling to the dogs.  He did not physically see her.  He took the dogs outside and came back in and found her sitting halfway down the staircase very confused.  He had not seen her normal since the night prior.  NIH stroke scale was initially 12. CTH NAICP. ASPECTS 10. TNK not administered 2/2 presentation outside the window. CTP negative. CTA showed occlusion non-dominant L V4 and embolus in the basilar tip.  Risks, benefits, and alternatives were personally discussed with her husband by Dr. StaQuinn Axed Dr. DevEstanislado Pandyncluding the 10% risk of  hemorrhage with intervention.  Husband gave informed consent to proceed.  Code IR was activated and patient was taken for thrombectomy.   HOSPITAL COURSE Stroke: Brainstem and cerebellar stroke due to terminal left vertebral artery occlusion with distal embolization to the basilar tip s/p TICI2c revascularization  Etiology: Likely large vessel atherosclerosis Code Stroke CT head No acute abnormality. ASPECTS 10.    CTA head & neck Basilar artery shows flow proximally but there is an embolus at the basilar tip.  Post IR CT No evidence of ICH MRI  Acute infarcts in the bilateral cerebellar hemispheres and pons 2D Echo LVEF 60-65%, no atrial level shunt detected. Loop recorder placed after CVA in July, interrogation negative for atrial fibrillation during current admission.  LDL 47 HgbA1c 6.0 VTE prophylaxis - Lovenox 40 mg aspirin 81 mg daily prior to admission, now on aspirin 81 mg daily and clopidogrel 75 mg daily.  Discharge on DAPT for 3 months followed by clopidogrel monotherapy. Oceanic study discussed with patient and patient's husband at bedside in detail and both verbalized eagerness to participate.  Therapy recommendations:  Outpatient PT Disposition:  Home with outpatient PT   Hypoxic respiratory failure, resolved Extubated 10/26 SpO2 97% on telemetry with patient on room air    Hypertension Home meds:  Lisinopril Stable BP goal normotensive   Hyperlipidemia Home meds:  Atorvastatin 80 mg, resumed in hospital LDL 47, goal < 70 Continue statin at discharge   Other Stroke Risk Factors Advanced Age >/= 31  Former Cigarette smoker Hx stroke/TIA Hx cerebral aneurysm s/p coiled Hx stroke/TIA- 09/2021 Acute right insula to right posterior basal ganglia/corona radiata ischemic infarct with petechial blood likely due to embolus and chronic small vessel disease    Other Active Problems    DISCHARGE EXAM Blood pressure 120/63, pulse 76, temperature 98.6 F (37 C), temperature source Axillary, resp. rate 16, height 5' (1.524 m), weight 51.2 kg, SpO2 94 %.  Physical Exam  Constitutional: Frail elderly Caucasian lady appears well-developed and well-nourished.   Cardiovascular: Normal rate and regular rhythm.  Respiratory: Effort normal, non-labored breathing   Neuro: Mental Status: Awake, alert, oriented to self and month. She is unable to correctly state her name. Per husband at bedside, patient had minimal intermittent confusion at home with some minimal interval worsening since hospitalization. Husband states patient's speech was hoarse yesterday following extubation, improved today.  Follows commands without difficulty. Speech with mild dysarthria, no aphasia noted.  Cranial Nerves: II: Visual Fields are full. PERRL III,IV, VI: EOMI without ptosis or diploplia.  Saccadic dysmetria on lateral gaze bilaterally.  No nystagmus. VII: Right facial asymmetry noted.  VIII: Hearing is intact  to voice X: Palate elevates symmetrically, phonation intact XI: Shoulder shrug is symmetric. XII: Tongue protrudes midline without atrophy or fasciculations.  Motor: Tone is normal. Bulk is normal. Moves all extremities antigravity. No vertical drift or asymmetry noted.  Sensory: Sensation is symmetric to light touch in the arms and legs. No extinction to DSS present.  Cerebellar: FNF and HKS are intact bilaterally  Discharge Diet       Diet   Diet regular Room service appropriate? Yes; Fluid consistency: Thin   liquids  DISCHARGE PLAN Disposition:  home with outpatient PT  aspirin 81 mg daily and clopidogrel 75 mg daily for secondary stroke prevention for 3 months then clopidogrel alone. Patient and patient spouse have expressed interest in enrolling in the [facility] stroke prevention trial after discussion with attending neurologist, Dr. Leonie Man at bedside.  Ongoing stroke  risk factor control by Primary Care Physician at time of discharge Follow-up PCP Alroy Dust, L.Marlou Sa, MD in 2 weeks. Follow-up in [facility] as per [facility] Stroke Study protocol    35 minutes were spent preparing discharge.  -- Anibal Henderson, AGACNP-BC [facility] [phone]  Antony Contras, MD

## 2022-01-08 NOTE — Progress Notes (Signed)
CBG 66 at  this time. OJ given  and recheck is 123. Elink MD notified.

## 2022-01-08 NOTE — Evaluation (Signed)
Occupational Therapy Evaluation Patient Details Name: Cassandra Alexander MRN: [id number] DOB: 04-03-1945 Today's Date: 01/08/2022   History of Present Illness 76 y.o. female presents to [facility] hospital on 01/06/2022 with confusion. Of note pt was admitted in June 2023 with ischemic CVA with petechial hemorrhage. CTA demonstrates occlusion non-dominant L V4 and embolus in the basilar tip. Pt underwent revascularization on 01/06/2022. RFF:[id number], depression, TIA, cerebral aneurysm status post coiling, HTN, gastritis, dysphagia, esophageal web, GERD, partial colectomy, IBS.   Clinical Impression   Patient evaluated by Occupational Therapy with no further acute OT needs identified. All education has been completed and the patient has no further questions. See below for any follow-up Occupational Therapy or equipment needs. OT to sign off. Thank you for referral.        Recommendations for follow up therapy are one component of a multi-disciplinary discharge planning process, led by the attending physician.  Recommendations may be updated based on patient status, additional functional criteria and insurance authorization.   Follow Up Recommendations  Outpatient OT    Assistance Recommended at Discharge Intermittent Supervision/Assistance  Patient can return home with the following A little help with walking and/or transfers;A little help with bathing/dressing/bathroom    Functional Status Assessment  Patient has had a recent decline in their functional status and demonstrates the ability to make significant improvements in function in a reasonable and predictable amount of time.  Equipment Recommendations  None recommended by OT    Recommendations for Other Services       Precautions / Restrictions Precautions Precautions: Fall Restrictions Weight Bearing Restrictions: No      Mobility Bed Mobility Overal bed mobility: Needs Assistance Bed Mobility: Sit to Supine       Sit to supine:  Min assist   General bed mobility comments: (A) to place bil LE onto bed surface.    Transfers Overall transfer level: Needs assistance Equipment used: None, Rolling walker (2 wheels) Transfers: Sit to/from Stand Sit to Stand: Min guard                  Balance Overall balance assessment: Needs assistance Sitting-balance support: No upper extremity supported, Feet supported Sitting balance-Leahy Scale: Good     Standing balance support: Single extremity supported, Reliant on assistive device for balance Standing balance-Leahy Scale: Poor Standing balance comment: minA without UE support                           ADL either performed or assessed with clinical judgement   ADL Overall ADL's : Needs assistance/impaired Eating/Feeding: Set up;Bed level   Grooming: Wash/dry hands;Min Administrator Transfer: Contractor- Clothing Manipulation and Hygiene: Minimal assistance;Sit to/from stand Toileting - Clothing Manipulation Details (indicate cue type and reason): (A) to for posterior peri care     Functional mobility during ADLs: Minimal assistance;Rolling walker (2 wheels)       Vision Baseline Vision/History: 1 Wears glasses       Perception     Praxis      Pertinent Vitals/Pain Pain Assessment Pain Assessment: Faces Faces Pain Scale: Hurts little more Pain Location: hand at IV site Pain Descriptors / Indicators: Burning Pain Intervention(s): Monitored during session, Repositioned     Hand Dominance Right   Extremity/Trunk Assessment Upper Extremity Assessment Upper Extremity Assessment: Overall WFL for tasks assessed  Lower Extremity Assessment Lower Extremity Assessment: Generalized weakness   Cervical / Trunk Assessment Cervical / Trunk Assessment: Normal   Communication Communication Communication: Expressive difficulties   Cognition Arousal/Alertness:  Awake/alert Behavior During Therapy: Impulsive Overall Cognitive Status: Impaired/Different from baseline Area of Impairment: Memory, Attention, Awareness, Safety/judgement                   Current Attention Level: Selective Memory: Decreased recall of precautions, Decreased short-term memory   Safety/Judgement: Decreased awareness of safety, Decreased awareness of deficits Awareness: Emergent         General Comments  VSS RA    Exercises     Shoulder Instructions      Home Living Family/patient expects to be discharged to:: Private residence Living Arrangements: Spouse/significant other Available Help at Discharge: Family;Available 24 hours/day Type of Home: House Home Access: Stairs to enter [employer] of Steps: 4 Entrance Stairs-Rails: Right Home Layout: Multi-level Alternate Level Stairs-Number of Steps: 15 Alternate Level Stairs-Rails: Left;Right Bathroom Shower/Tub: Teacher, early years/pre: Standard     Home Equipment: None   Additional Comments: Husband works part time from home; can provide assist  Lives With: Spouse    Prior Functioning/Environment Prior Level of Function : Independent/Modified Independent;Driving             Mobility Comments: ambulates without a device ADLs Comments: Ind with ADLs at baseline        OT Problem List:        OT Treatment/Interventions:      OT Goals(Current goals can be found in the care plan section) Acute Rehab OT Goals Patient Stated Goal: to get therapy outpatient  OT Frequency:      Co-evaluation              AM-PAC OT "6 Clicks" Daily Activity     Outcome Measure Help from another person eating meals?: None Help from another person taking care of personal grooming?: A Little Help from another person toileting, which includes using toliet, bedpan, or urinal?: A Little Help from another person bathing (including washing, rinsing, drying)?: A Little Help from  another person to put on and taking off regular upper body clothing?: A Little Help from another person to put on and taking off regular lower body clothing?: A Little 6 Click Score: 19   End of Session Equipment Utilized During Treatment: Rolling walker (2 wheels) Nurse Communication: Mobility status;Precautions  Activity Tolerance: Patient tolerated treatment well Patient left: in bed;with call bell/phone within reach;with bed alarm set;with family/visitor present  OT Visit Diagnosis: Unsteadiness on feet (R26.81)                Time: 1025-1040 OT Time Calculation (min): 15 min Charges:  OT General Charges $OT Visit: 1 Visit OT Evaluation $OT Eval Moderate Complexity: 1 Mod   Brynn, OTR/L  Acute Rehabilitation Services Office: [phone] .   Jeri Modena 01/08/2022, 4:28 PM

## 2022-01-08 NOTE — Progress Notes (Signed)
   01/08/22 1200  Vitals  BP 120/63  MAP (mmHg) 78  Pulse Rate 76  ECG Heart Rate 77  Resp 16  Level of Consciousness  Level of Consciousness Alert  Oxygen Therapy  SpO2 94 %  O2 Device Room Air  Pre-WUA / WUA Start  [place] Agitation Sedation Scale (RASS) 0  RASS Goal 0  Pain Assessment  Pain Scale 0-10  Pain Score 0  Glasgow Coma Scale  Eye Opening 4  Best Verbal Response (NON-intubated) 5  Best Motor Response 6  Glasgow Coma Scale Score 15  MEWS Score  MEWS Temp 0  MEWS Systolic 0  MEWS Pulse 0  MEWS RR 0  MEWS LOC 0  MEWS Score 0  MEWS Score Color Green   AVS reviewed with patient and her husband at bedside. All questions answered. Husband will be taking patient home. All IV's removed.  Montez Hageman RN

## 2022-01-08 NOTE — Evaluation (Addendum)
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   Occlusion of distal basilar artery 01/06/2022   Prediabetes 60/12/9321   Diastolic dysfunction 55/73/2202   Benign essential hypertension 09/25/2021   Chronic gastritis 09/25/2021   Dysphagia 09/25/2021   Gastroesophageal reflux disease 09/25/2021   Esophageal web 09/25/2021   History of partial colectomy 09/25/2021   Irritable bowel syndrome 09/25/2021   Acute ischemic stroke ([place]) 09/25/2021   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[place]) 09/25/2021   Primary generalized (osteo)arthritis 09/25/2021   Low back pain 09/25/2021   S/P cerebral aneurysm operation    Anxiety and depression 07/07/2015   Past Medical History:  Past Medical History:  Diagnosis Date   Anxiety    Cerebral aneurysm 2006   s/p  coiling right side  ("find during an MRI for headaches")   PCOM   Depression    Diverticula of colon    Headache 07/07/2015   Hyperlipidemia    IBS (irritable bowel syndrome)    Left sided numbness 07/07/2015   Osteoarthritis    knees, back, hands, hips   Primary localized osteoarthritis of left knee 05/16/2018   Primary localized osteoarthritis of left knee 05/16/2018   Pulmonary nodule 1 cm or greater in diameter 05/20/2021   S/P left unicompartmental knee replacement 05/16/2018   TIA (transient ischemic attack) 07/07/2015   Wears glasses    Past Surgical History:  Past Surgical History:  Procedure Laterality Date   ANEURYSM COILING  2006 @[facility]    right PCOM   CESAREAN SECTION  x2  last one  1970s   COLONOSCOPY     IR CT HEAD LTD  01/06/2022   IR PERCUTANEOUS ART THROMBECTOMY/INFUSION INTRACRANIAL INC DIAG ANGIO  01/06/2022   LUMBAR FUSION  2017   --  dr  Trenton Gammon per pt   PARTIAL KNEE ARTHROPLASTY Left 05/16/2018   Procedure: UNICOMPARTMENTAL KNEE;  Surgeon: Marchia Bond, MD;  Location: [facility];  Service: Orthopedics;  Laterality: Left;   RADIOLOGY WITH ANESTHESIA N/A 01/06/2022   Procedure: IR WITH ANESTHESIA;  Surgeon: Radiologist, Medication, MD;  Location: [place];  Service: Radiology;  Laterality: N/A;   RECTAL PROLAPSE REPAIR  2004 approx.  @[facility]    TONSILLECTOMY AND ADENOIDECTOMY  age 62   TOTAL KNEE ARTHROPLASTY Right early 2000s   VAGINAL HYSTERECTOMY  eary age 79s   ovaries remain   HPI:  76 y.o. female presents to [facility] hospital on 01/06/2022 with confusion. Of note pt was admitted in June 2023 with ischemic CVA with petechial hemorrhage. CTA demonstrates occlusion non-dominant L V4 and embolus in the basilar tip. Pt underwent revascularization on 01/06/2022. RKY:HCWCBJS, depression, TIA, cerebral aneurysm status post coiling, HTN, gastritis, dysphagia, esophageal web, GERD, partial colectomy, IBS.   Assessment / Plan / Recommendation Clinical Impression  Patient presents with deficits in the areas of word finding (mild), comprehension of complex information, short term memory, problem solving, reasoning, and safety awareness. Per spouse, deficits in these areas were noted prior to this hospitalization as a result of CVA in June 2023 but are notably worse. Plans for for discharge today with 24 hour care. Recommend OP SLP services. q   Received orders for swallow evaluation however per RN, patient passed [place] and is swallowing well. Deferring these orders.  SLP Assessment  SLP Recommendation/Assessment: All further Speech Lanaguage Pathology  needs can be addressed in the next venue of care SLP Visit Diagnosis: Cognitive communication deficit (R41.841)    Recommendations for follow up therapy are one component of a multi-disciplinary discharge planning process, led by the attending physician.  Recommendations may be updated based on patient  status, additional functional criteria and insurance authorization.    Follow Up Recommendations  Outpatient SLP    Assistance Recommended at Discharge  Frequent or constant Supervision/Assistance           SLP Evaluation Cognition  Overall Cognitive Status: Impaired/Different from baseline Arousal/Alertness: Awake/alert Orientation Level: Oriented to person;Oriented to place;Oriented to situation (difficulty with age) Attention: Sustained Sustained Attention: Impaired Sustained Attention Impairment: Verbal basic;Functional basic Memory: Impaired Memory Impairment: Retrieval deficit Awareness: Impaired Awareness Impairment: Intellectual impairment Problem Solving: Impaired Problem Solving Impairment: Verbal complex;Functional complex Executive Function: Reasoning;Decision Making Reasoning: Impaired Reasoning Impairment: Verbal complex;Functional complex Decision Making: Impaired Decision Making Impairment: Verbal complex;Functional complex (impulsive) Behaviors: Impulsive Safety/Judgment: Impaired Comments: poor safety awareness/impulsive       Comprehension  Auditory Comprehension Overall Auditory Comprehension: Impaired Yes/No Questions: Within Functional Limits Commands: Impaired Multistep Basic Commands: 50-74% accurate Visual Recognition/Discrimination Discrimination: Within Function Limits Reading Comprehension Reading Status: Within funtional limits    Expression Expression Primary Mode of Expression: Verbal Verbal Expression Overall Verbal Expression: Impaired Initiation: No impairment Automatic Speech: Name;Social Response Level of Generative/Spontaneous Verbalization: Conversation Repetition: No impairment Naming: Impairment Responsive: 76-100% accurate Confrontation: Impaired Convergent: 75-100% accurate Divergent: 75-100% accurate Pragmatics: Impairment Impairments: Abnormal affect (intermittently) Interfering Components: Attention Written  Expression Dominant Hand: Right   Oral / Motor  Oral Motor/Sensory Function Overall Oral Motor/Sensory Function: Mild impairment Facial ROM: Reduced right Facial Symmetry: Abnormal symmetry right Facial Strength: Within Functional Limits Facial Sensation: Within Functional Limits Lingual ROM: Within Functional Limits Lingual Symmetry: Within Functional Limits Lingual Strength: Within Functional Limits Lingual Sensation: Within Functional Limits Velum: Within Functional Limits Mandible: Within Functional Limits Motor Speech Overall Motor Speech: Impaired (mild dysarthria) Respiration: Within functional limits Phonation: Normal Resonance: Within functional limits Articulation: Impaired Level of Impairment: Word Intelligibility: Intelligible           Tymira Horkey MA, CCC-SLP  Carlon Davidson Meryl 01/08/2022, 10:37 AM

## 2022-01-08 NOTE — Progress Notes (Signed)
 Investigational Drug Service New Study Start: OCEANIC-STROKE   SUMMARY For more information refer to: [email or web address]. Study Identifier: [id number]    A Multicenter, International, Randomized, Placebo Controlled, Double-blind, Parallel Group and Event Driven Phase 3 Study of the Oral FXIa Inhibitor Asundexian ([place] [id number]) for the Prevention of Ischemic Stroke in Female and Female Participants Aged 75 Years and Older After an Acute Non-cardioembolic Ischemic Stroke or High-risk TIA  Brief Summary This study will randomize adult patients with an initial diagnosis of acute non-cardioembolic ischemic stroke or high-risk transient ischemic attack (TIA) to treatment within 72 hours of symptom onset and with the intention to be treated with antiplatelet therapy.  Design Phase 3, Randomized, Interventional, Event-Driven  Intervention Asundexian ([id number]) 50 mg tablets or matching placebo tablets (pink, oval, film-coated tablets  Concomitant Therapy Participants are to receive antiplatelet therapy (single or dual; provider discretion) during the study conduct.  Prohibited Therapy Oral anticoagulation Full dose and/or long-term anticoagulation therapy with heparin or LMWH  Chronic (more than 4 weeks continuous) therapy with NSAIDs during the study conduct Concomitant use of combined P-gp and strong/moderate CYP3A4 inducers Concomitant use of combined P-gp and strong CYP3A4 inhibitors Herbal/traditional medications and/or supplements with known anticoagulant/antiplatelet effects  Anticoagulation Prophylaxis Venous thromboembolism prophylaxis with LMWH or unfractionated heparin for short periods of time (2 weeks) is allowed.  Potential Drug-Drug Interactions Rosuvastatin 20 mg daily or higher or atorvastatin 80 mg (additional monitoring may be warranted due to increased concentrations of rosuvastatin and atorvastatin - asundexian ([id number]) is a weak inhibitor of CYP2C8)  Administration   Can be taken irrespective of food intake. Should be swallowed whole with water, preferably in the morning. CANNOT be crushed or broken.      Plan: Start Cecille Rubin [phone]) 50 mg tablets or placebo] today. Study medication must be picked up from pharmacy, medication can not be tubed.    Please contact [facility] if any questions or concerns regarding the study medication.    Acey Lav, PharmD, [place] Investigational Drug Service Pharmacist  [phone]

## 2022-01-08 NOTE — Inpatient Diabetes Management (Signed)
Inpatient Diabetes Program Recommendations  AACE/ADA: New Consensus Statement on Inpatient Glycemic Control (2015)  Target Ranges:  Prepandial:   less than 140 mg/dL      Peak postprandial:   less than 180 mg/dL (1-2 hours)      Critically ill patients:  140 - 180 mg/dL   Lab Results  Component Value Date   GLUCAP 124 (H) 01/08/2022   HGBA1C 6.0 (H) 01/07/2022    Review of Glycemic Control  Latest Reference Range & Units 01/07/22 20:13 01/07/22 23:44 01/08/22 00:10 01/08/22 03:31 01/08/22 08:39  Glucose-Capillary 70 - 99 mg/dL 128 (H)  Novolog 1 unit 66 (L) 123 (H) 137 (H) 124 (H)  (H): Data is abnormally high (L): Data is abnormally low  Diabetes history: None Outpatient Diabetes medications: none Current orders for Inpatient glycemic control: Novolog 0-9 units Q4H  Inpatient Diabetes Program Recommendations:    Might consider discontinuing Novolog 0-9 units Q4H as she has had a couple of mild hypoglycemia episodes after receiving 1 unit of correction.    Might consider Novolog 0-6 units (very sensitive) Q4H if consistently > 180 mg/dL.  Will continue to follow while inpatient.  Thank you, Reche Dixon, MSN, [place] Diabetes Coordinator Inpatient Diabetes Program [phone] (team pager from 8a-5p)

## 2022-01-08 NOTE — Progress Notes (Signed)
[place] stroke research study note  Patient is participating in the [place] stroke prevention study.( A Multicenter, International, Randomized, Placebo Controlled, Double-blind, Parallel Group and Event Driven Phase 3 Study of the Oral FXIa Inhibitor Asundexian ([place] [id number]) for the Prevention of Ischemic Stroke in Female and Female Participants Aged 75 Years and Older After an Acute Non-cardioembolic Ischemic Stroke or High-risk TIA) Patient and her husband were given study material to review yesterday evening.  After they expressed an interest in participation in the study they were given opportunity to ask questions which were answered.  Today morning patient's daughter was present at the bedside and husband was available over the phone and their questions about the study were answered.  It was made clear to the patient and family that study participation is voluntary and the patient will still get the same excellent standard of care irrespective of whether she chooses to participate in the study or not.  The benefits of study participation as well as the risk and alternatives were clearly discussed with the patient.  Patient was clearly informed that she was under no obligation to stay in the study for the entire duration and was free to discontinue and anytime either the study medication or study participation if she is dissatisfied at any time in the future.  The research study office visit was explained to the patient and study obligations were clearly explained as well.  Patient and family voiced understanding and willingness for her to participate in the study.  The study inclusion and exclusion criteria were personally reviewed by me as well as study coordinator and we verified that patient met all criteria.  Patient signed informed consent form in my presence as well as presence of study coordinator and her daughter at 11:15 AM on 01/08/2022.  The hospital research pharmacy was notified in advance about  potential patient participation and after patient randomization the patient received first dose of medication before discharge home and she was discharged home with the study medication bottle and instructed to call [place] research office with any questions.  No study specific procedure was done prior to patient signing informed consent form.  A copy of the informed consent form and patient [place] of Rights signed by the patient was given to her. Flow study related questions call Dr. Leonie Man at [phone]  Antony Contras, MD

## 2022-01-13 ENCOUNTER — Inpatient Hospital Stay (HOSPITAL_COMMUNITY)
Admission: EM | Admit: 2022-01-13 | Discharge: 2022-01-18 | DRG: 253 | Disposition: A | Discharge status: Home / Self Care | Payer: PPO | Attending: Internal Medicine | Admitting: Internal Medicine

## 2022-01-13 ENCOUNTER — Emergency Department (HOSPITAL_COMMUNITY): Payer: PPO

## 2022-01-13 ENCOUNTER — Encounter (HOSPITAL_COMMUNITY): Payer: Self-pay

## 2022-01-13 ENCOUNTER — Emergency Department (HOSPITAL_BASED_OUTPATIENT_CLINIC_OR_DEPARTMENT_OTHER): Payer: PPO

## 2022-01-13 ENCOUNTER — Other Ambulatory Visit: Payer: Self-pay

## 2022-01-13 ENCOUNTER — Telehealth: Payer: Self-pay | Admitting: Neurology

## 2022-01-13 DIAGNOSIS — R911 Solitary pulmonary nodule: Secondary | ICD-10-CM | POA: Diagnosis present

## 2022-01-13 DIAGNOSIS — K219 Gastro-esophageal reflux disease without esophagitis: Secondary | ICD-10-CM | POA: Diagnosis present

## 2022-01-13 DIAGNOSIS — Z79899 Other long term (current) drug therapy: Secondary | ICD-10-CM

## 2022-01-13 DIAGNOSIS — T81718A Complication of other artery following a procedure, not elsewhere classified, initial encounter: Secondary | ICD-10-CM | POA: Diagnosis present

## 2022-01-13 DIAGNOSIS — Z7902 Long term (current) use of antithrombotics/antiplatelets: Secondary | ICD-10-CM

## 2022-01-13 DIAGNOSIS — Y838 Other surgical procedures as the cause of abnormal reaction of the patient, or of later complication, without mention of misadventure at the time of the procedure: Secondary | ICD-10-CM | POA: Diagnosis present

## 2022-01-13 DIAGNOSIS — Z8249 Family history of ischemic heart disease and other diseases of the circulatory system: Secondary | ICD-10-CM

## 2022-01-13 DIAGNOSIS — Z981 Arthrodesis status: Secondary | ICD-10-CM

## 2022-01-13 DIAGNOSIS — D62 Acute posthemorrhagic anemia: Secondary | ICD-10-CM | POA: Diagnosis present

## 2022-01-13 DIAGNOSIS — Z90710 Acquired absence of both cervix and uterus: Secondary | ICD-10-CM | POA: Diagnosis not present

## 2022-01-13 DIAGNOSIS — I729 Aneurysm of unspecified site: Principal | ICD-10-CM

## 2022-01-13 DIAGNOSIS — Z9079 Acquired absence of other genital organ(s): Secondary | ICD-10-CM | POA: Diagnosis not present

## 2022-01-13 DIAGNOSIS — Z8673 Personal history of transient ischemic attack (TIA), and cerebral infarction without residual deficits: Secondary | ICD-10-CM

## 2022-01-13 DIAGNOSIS — N281 Cyst of kidney, acquired: Secondary | ICD-10-CM | POA: Diagnosis not present

## 2022-01-13 DIAGNOSIS — Z7982 Long term (current) use of aspirin: Secondary | ICD-10-CM

## 2022-01-13 DIAGNOSIS — E785 Hyperlipidemia, unspecified: Secondary | ICD-10-CM | POA: Diagnosis present

## 2022-01-13 DIAGNOSIS — I724 Aneurysm of artery of lower extremity: Secondary | ICD-10-CM | POA: Diagnosis present

## 2022-01-13 DIAGNOSIS — L7632 Postprocedural hematoma of skin and subcutaneous tissue following other procedure: Secondary | ICD-10-CM | POA: Diagnosis not present

## 2022-01-13 DIAGNOSIS — Z823 Family history of stroke: Secondary | ICD-10-CM | POA: Diagnosis not present

## 2022-01-13 DIAGNOSIS — Z880 Allergy status to penicillin: Secondary | ICD-10-CM | POA: Diagnosis not present

## 2022-01-13 DIAGNOSIS — Z87891 Personal history of nicotine dependence: Secondary | ICD-10-CM | POA: Diagnosis not present

## 2022-01-13 DIAGNOSIS — F32A Depression, unspecified: Secondary | ICD-10-CM | POA: Diagnosis present

## 2022-01-13 DIAGNOSIS — F418 Other specified anxiety disorders: Secondary | ICD-10-CM | POA: Diagnosis not present

## 2022-01-13 DIAGNOSIS — Z8679 Personal history of other diseases of the circulatory system: Secondary | ICD-10-CM

## 2022-01-13 DIAGNOSIS — D649 Anemia, unspecified: Secondary | ICD-10-CM

## 2022-01-13 DIAGNOSIS — F419 Anxiety disorder, unspecified: Secondary | ICD-10-CM | POA: Diagnosis present

## 2022-01-13 DIAGNOSIS — K760 Fatty (change of) liver, not elsewhere classified: Secondary | ICD-10-CM | POA: Diagnosis not present

## 2022-01-13 DIAGNOSIS — Z96653 Presence of artificial knee joint, bilateral: Secondary | ICD-10-CM | POA: Diagnosis present

## 2022-01-13 DIAGNOSIS — I10 Essential (primary) hypertension: Secondary | ICD-10-CM | POA: Diagnosis present

## 2022-01-13 DIAGNOSIS — Z888 Allergy status to other drugs, medicaments and biological substances status: Secondary | ICD-10-CM

## 2022-01-13 DIAGNOSIS — C434 Malignant melanoma of scalp and neck: Secondary | ICD-10-CM | POA: Diagnosis not present

## 2022-01-13 DIAGNOSIS — I119 Hypertensive heart disease without heart failure: Secondary | ICD-10-CM | POA: Diagnosis not present

## 2022-01-13 DIAGNOSIS — K5900 Constipation, unspecified: Secondary | ICD-10-CM | POA: Diagnosis not present

## 2022-01-13 LAB — COMPREHENSIVE METABOLIC PANEL
ALT: 14 U/L (ref 0–44)
AST: 20 U/L (ref 15–41)
Albumin: 3.4 g/dL — ABNORMAL LOW (ref 3.5–5.0)
Alkaline Phosphatase: 52 U/L (ref 38–126)
Anion gap: 12 (ref 5–15)
BUN: 11 mg/dL (ref 8–23)
CO2: 29 mmol/L (ref 22–32)
Calcium: 9.3 mg/dL (ref 8.9–10.3)
Chloride: 102 mmol/L (ref 98–111)
Creatinine, Ser: 0.6 mg/dL (ref 0.44–1.00)
GFR, Estimated: >60 mL/min (ref >60)
Glucose, Bld: 142 mg/dL — ABNORMAL HIGH (ref 70–99)
Potassium: 3.5 mmol/L (ref 3.5–5.1)
Sodium: 143 mmol/L (ref 135–145)
Total Bilirubin: 0.8 mg/dL (ref 0.3–1.2)
Total Protein: 6.2 g/dL — ABNORMAL LOW (ref 6.5–8.1)

## 2022-01-13 LAB — CBC WITH DIFFERENTIAL/PLATELET
Abs Immature Granulocytes: 0.05 10*3/uL (ref 0.00–0.07)
Basophils Absolute: 0 10*3/uL (ref 0.0–0.1)
Basophils Relative: 0 %
Eosinophils Absolute: 0 10*3/uL (ref 0.0–0.5)
Eosinophils Relative: 0 %
HCT: 25.7 % — ABNORMAL LOW (ref 36.0–46.0)
Hemoglobin: 8.1 g/dL — ABNORMAL LOW (ref 12.0–15.0)
Immature Granulocytes: 1 %
Lymphocytes Relative: 18 %
Lymphs Abs: 1.4 10*3/uL (ref 0.7–4.0)
MCH: 30.8 pg (ref 26.0–34.0)
MCHC: 31.5 g/dL (ref 30.0–36.0)
MCV: 97.7 fL (ref 80.0–100.0)
Monocytes Absolute: 0.7 10*3/uL (ref 0.1–1.0)
Monocytes Relative: 10 %
Neutro Abs: 5.4 10*3/uL (ref 1.7–7.7)
Neutrophils Relative %: 71 %
Platelets: 235 10*3/uL (ref 150–400)
RBC: 2.63 MIL/uL — ABNORMAL LOW (ref 3.87–5.11)
RDW: 14.6 % (ref 11.5–15.5)
WBC: 7.6 10*3/uL (ref 4.0–10.5)
nRBC: 0 % (ref 0.0–0.2)

## 2022-01-13 LAB — PREPARE RBC (CROSSMATCH)

## 2022-01-13 MED ORDER — LISINOPRIL 10 MG PO TABS: 10.0000 mg | ORAL_TABLET | Freq: Every day | ORAL | Status: DC

## 2022-01-13 MED ORDER — ONDANSETRON HCL 4 MG/2ML IJ SOLN: 4.0000 mg | Freq: Four times a day (QID) | INTRAMUSCULAR | Status: DC | PRN

## 2022-01-13 MED ORDER — LISINOPRIL 10 MG PO TABS
10.0000 mg | ORAL_TABLET | Freq: Every day | ORAL | Status: DC
  Administered 2022-01-14 – 2022-01-18 (×5): 10 mg via ORAL
  Filled 2022-01-13 (×5): qty 1

## 2022-01-13 MED ORDER — MESALAMINE 1.2 G PO TBEC
2.4000 g | DELAYED_RELEASE_TABLET | Freq: Every day | ORAL | Status: DC
  Administered 2022-01-14 – 2022-01-18 (×5): 2.4 g via ORAL
  Filled 2022-01-13 (×5): qty 2

## 2022-01-13 MED ORDER — HYDRALAZINE HCL 20 MG/ML IJ SOLN
10.0000 mg | Freq: Three times a day (TID) | INTRAMUSCULAR | Status: DC | PRN
  Administered 2022-01-15: 10 mg via INTRAVENOUS
  Filled 2022-01-13: qty 1

## 2022-01-13 MED ORDER — SODIUM CHLORIDE 0.9 % IV SOLN
10.0000 mL/h | Freq: Once | INTRAVENOUS | Status: AC
  Administered 2022-01-13: 10 mL/h via INTRAVENOUS

## 2022-01-13 MED ORDER — SODIUM CHLORIDE 0.9 % IV SOLN: INTRAVENOUS | Status: DC

## 2022-01-13 MED ORDER — NICOTINE POLACRILEX 2 MG MT GUM: 2.0000 mg | CHEWING_GUM | OROMUCOSAL | Status: DC | PRN

## 2022-01-13 MED ORDER — IMIPRAMINE HCL 25 MG PO TABS
100.0000 mg | ORAL_TABLET | Freq: Every day | ORAL | Status: DC
  Administered 2022-01-13 – 2022-01-17 (×5): 100 mg via ORAL
  Filled 2022-01-13 (×6): qty 4

## 2022-01-13 MED ORDER — CITALOPRAM HYDROBROMIDE 20 MG PO TABS
10.0000 mg | ORAL_TABLET | Freq: Every day | ORAL | Status: DC
  Administered 2022-01-13 – 2022-01-17 (×5): 10 mg via ORAL
  Filled 2022-01-13 (×5): qty 1

## 2022-01-13 MED ORDER — ONDANSETRON HCL 4 MG PO TABS: 4.0000 mg | ORAL_TABLET | Freq: Four times a day (QID) | ORAL | Status: DC | PRN

## 2022-01-13 MED ORDER — IOHEXOL 350 MG/ML SOLN
75.0000 mL | Freq: Once | INTRAVENOUS | Status: AC | PRN
  Administered 2022-01-13: 75 mL via INTRAVENOUS

## 2022-01-13 MED ORDER — ATORVASTATIN CALCIUM 80 MG PO TABS
80.0000 mg | ORAL_TABLET | Freq: Every day | ORAL | Status: DC
  Administered 2022-01-13 – 2022-01-18 (×6): 80 mg via ORAL
  Filled 2022-01-13 (×2): qty 1
  Filled 2022-01-13: qty 2
  Filled 2022-01-13 (×3): qty 1

## 2022-01-13 MED ORDER — STUDY - OCEANIC-STROKE - ASUNDEXIAN 50 MG OR PLACEBO TABLET (PI-SETHI): 1.0000 | ORAL_TABLET | Freq: Every day | ORAL | Status: DC

## 2022-01-13 MED ORDER — FENTANYL CITRATE PF 50 MCG/ML IJ SOSY
50.0000 ug | PREFILLED_SYRINGE | Freq: Once | INTRAMUSCULAR | Status: AC
  Administered 2022-01-13: 50 ug via INTRAVENOUS
  Filled 2022-01-13: qty 1

## 2022-01-13 NOTE — Consult Note (Signed)
Chief Complaint: Patient was seen in consultation today for hematoma  Referring Physician(s): Dr. Zenia Resides  Supervising Physician: Luanne Bras  Patient Status: [facility] - ED  History of Present Illness: Cassandra Alexander is a 76 y.o. female with past medical history of stroke 01/06/22.  She was found to have a large vessel occlusion and underwent thrombectomy with Dr. Estanislado Pandy.  She did endorse procedure site tenderness, however on exam was soft with no evidence of active bleeding or appreciable pseudoaneurysm. She was discharged home where she did notice intermittent tenderness and some slow progressive bruising to the right thigh.  She denies active bleeding or drainage.  The leg has become sore and she feels fatigued.  She presented to her PCP today who referred her to the ED for evaluation.   Patient assessed in the ED alongside Dr. Estanislado Pandy.  She does have extensive bruising which appears to be in various stages of healing.  She is exquisitely tender in right anterior thigh.  There is a palpable area of concern distal to the procedure today.  No active bleeding. Thigh is otherwise soft.   Past Medical History:  Diagnosis Date   Anxiety    Cerebral aneurysm 2006   s/p  coiling right side  ("find during an MRI for headaches")   PCOM   Depression    Diverticula of colon    Headache 07/07/2015   Hyperlipidemia    IBS (irritable bowel syndrome)    Left sided numbness 07/07/2015   Osteoarthritis    knees, back, hands, hips   Primary localized osteoarthritis of left knee 05/16/2018   Primary localized osteoarthritis of left knee 05/16/2018   Pulmonary nodule 1 cm or greater in diameter 05/20/2021   S/P left unicompartmental knee replacement 05/16/2018   TIA (transient ischemic attack) 07/07/2015   Wears glasses     Past Surgical History:  Procedure Laterality Date   ANEURYSM COILING  2006 @[facility]    right PCOM   CESAREAN SECTION  x2  last one  1970s   COLONOSCOPY     IR CT HEAD LTD   01/06/2022   IR PERCUTANEOUS ART THROMBECTOMY/INFUSION INTRACRANIAL INC DIAG ANGIO  01/06/2022   LUMBAR FUSION  2017   --  dr Trenton Gammon per pt   PARTIAL KNEE ARTHROPLASTY Left 05/16/2018   Procedure: UNICOMPARTMENTAL KNEE;  Surgeon: Marchia Bond, MD;  Location: [facility];  Service: Orthopedics;  Laterality: Left;   RADIOLOGY WITH ANESTHESIA N/A 01/06/2022   Procedure: IR WITH ANESTHESIA;  Surgeon: Radiologist, Medication, MD;  Location: [place];  Service: Radiology;  Laterality: N/A;   RECTAL PROLAPSE REPAIR  2004 approx.  @[facility]    TONSILLECTOMY AND ADENOIDECTOMY  age 27   TOTAL KNEE ARTHROPLASTY Right early 2000s   VAGINAL HYSTERECTOMY  eary age 7s   ovaries remain    Allergies: Cortisone and Penicillins  Medications: Prior to Admission medications   Medication Sig Start Date End Date Taking? Authorizing Provider  atorvastatin (LIPITOR) 80 MG tablet Take 1 tablet (80 mg total) by mouth daily. 01/08/22 01/03/23 Yes Rikki Spearing, NP  citalopram (CELEXA) 10 MG tablet Take 10 mg by mouth at bedtime.   Yes [provider]  imipramine (TOFRANIL) 50 MG tablet Take 2 tablets by mouth at bedtime.   Yes [provider]  lisinopril (ZESTRIL) 10 MG tablet Take 10 mg by mouth daily. 09/03/21  Yes [provider]  mesalamine (LIALDA) 1.2 g EC tablet Take 2.4 g by mouth daily with breakfast. 04/23/21  Yes [provider]  nicotine polacrilex (NICORETTE) 2 MG gum Take 2 mg by mouth as needed for smoking cessation.   Yes [provider]  aspirin 81 MG chewable tablet Chew 1 tablet (81 mg total) by mouth daily. Patient not taking: Reported on 01/13/2022 01/09/22   Rikki Spearing, NP  clopidogrel (PLAVIX) 75 MG tablet Take 1 tablet (75 mg total) by mouth daily. Patient not taking: Reported on 01/13/2022 01/09/22   Rikki Spearing, NP  pantoprazole (PROTONIX) 20 MG tablet Take 20 mg by mouth daily. Patient not taking: Reported on 01/13/2022    [provider]  Study - OCEANIC-STROKE - asundexian 50 mg or placebo tablet (PI-Sethi) Take 1 tablet (50 mg total) by mouth daily. For Investigational Use Only. Take 1 tablet my mouth daily at the same time each day (preferably in the morning). Tablet should be swallowed whole with water; it CANNOT be crushed or broken. Please bring your bottle with you to each research visit. Please contact [facility] for any questions or concerns. Patient not taking: Reported on 01/13/2022 01/08/22   Rikki Spearing, NP     Family History  Problem Relation Age of Onset   Hypertension Mother    Stroke Mother    Osteoarthritis Father     Social History   Socioeconomic History   Marital status: Married    Spouse name: Not on file   Number of children: Not on file   Years of education: Not on file   Highest education level: Not on file  Occupational History   Not on file  Tobacco Use   Smoking status: Former    Years: 20.00    Types: Cigarettes    Quit date: 03/15/1990    Years since quitting: 31.8   Smokeless tobacco: Never  Vaping Use   Vaping Use: Never used  Substance and Sexual Activity   Alcohol use: Not Currently   Drug use: Never   Sexual activity: Not on file  Other Topics Concern   Not on file  Social History Narrative   Not on file   Social Determinants of Health   Financial Resource Strain: Not on file  Food Insecurity: Not on file  Transportation Needs: Not on file  Physical Activity: Not on file  Stress: Not on file  Social Connections: Not on file     Review of Systems: A 12 point ROS discussed and pertinent positives are indicated in the HPI above.  All other systems are negative.  Review of Systems  Constitutional:  Positive for fatigue. Negative for fever.  Respiratory:  Negative for cough and shortness of breath.   Cardiovascular:  Negative for chest pain.  Gastrointestinal:  Negative for abdominal pain, nausea and vomiting.  Musculoskeletal:  Positive  for back pain.  Skin:  Positive for color change (bruising to RLE).  Psychiatric/Behavioral:  Negative for behavioral problems and confusion.     Vital Signs: BP (!) 145/66   Pulse 81   Temp (!) 97.5 F (36.4 C) (Oral)   Resp (!) 22   Ht 4' 11"  (1.499 m)   Wt 108 lb (49 kg)   SpO2 94%   BMI 21.81 kg/m   Physical Exam Vitals and nursing note reviewed.  Constitutional:      General: She is not in acute distress.    Appearance: Normal appearance. She is not ill-appearing.  HENT:     Mouth/Throat:     Mouth: Mucous membranes are moist.  Pharynx: Oropharynx is clear.  Cardiovascular:     Rate and Rhythm: Normal rate and regular rhythm.  Pulmonary:     Effort: Pulmonary effort is normal. No respiratory distress.     Breath sounds: Normal breath sounds.  Abdominal:     General: Abdomen is flat. There is no distension.     Palpations: Abdomen is soft.     Tenderness: There is no abdominal tenderness.  Musculoskeletal:     Comments: RLE with extensive bruising in the anterior, lateral, and medial thigh.  There is a palpable area of concern in the anterior thigh which is concerning for pseudoaneurysm.  Bruising extends from right hip to right knee.   Neurological:     General: No focal deficit present.     Mental Status: She is alert. Mental status is at baseline.  Psychiatric:        Mood and Affect: Mood normal.        Behavior: Behavior normal.        Thought Content: Thought content normal.        Judgment: Judgment normal.          Imaging: IR PERCUTANEOUS ART THROMBECTOMY/INFUSION INTRACRANIAL INC DIAG ANGIO  Result Date: 01/08/2022 INDICATION: History of acute change in mental status, and diffuse weakness and left sided nystagmus. 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 EXAM: 1. EMERGENT LARGE VESSEL OCCLUSION THROMBOLYSIS (POSTERIOR CIRCULATION) COMPARISON:  None Available. MEDICATIONS: Ancef 2 g IV antibiotic was  administered within 1 hour of the procedure. ANESTHESIA/SEDATION: General anesthesia. CONTRAST:  Omnipaque 300 approximately 90 cc. FLUOROSCOPY TIME:  Fluoroscopy Time: 53 minutes 12 seconds (1411 mGy). COMPLICATIONS: None immediate. TECHNIQUE: Following a full explanation of the procedure along with the potential associated complications, an informed witnessed consent was obtained. The risks of intracranial hemorrhage of 10%, worsening neurological deficit, ventilator dependency, death and inability to revascularize were all reviewed in detail with the patient's husband. The patient was then put under general anesthesia by the Department of Anesthesiology at [facility]. The right groin was prepped and draped in the usual sterile fashion. Thereafter using modified Seldinger technique, transfemoral access into the right common femoral artery was obtained without difficulty. Over a 0.035 inch guidewire an 8 [place] 15 cm [place] Pinnacle sheath was inserted. Through this, and also over a 0.035 inch guidewire a 5.5 French 125 cm Berenstein support catheter inside of an 088 95 cm Neuron Max sheath was advanced to the aortic arch region. The support catheter was then advanced into the right subclavian artery just proximal to the origin of the right vertebral artery. The guidewire was removed. Good aspiration was obtained from the hub of the Neuron Max sheath. Control arteriogram at the origin of the right vertebral artery was then performed centered extra cranially and intracranially. FINDINGS: The right vertebral artery origin demonstrated patency proximally, with a moderate kink distal to this. More distally, the vessel is seen to opacify to the cranial skull base. Contrast was seen extending into the proximal basilar artery. PROCEDURE: Attempts were made to advanced an intermediary catheter through the right vertebral artery distally with an 035 inch guidewire, 024 micro guidewire with an 027 microcatheter  without success due to the extreme of the innominate artery origin from the aortic arch resulting in herniation of the platform into the aortic arch. Eventually access was obtained using a 125 cm 4 French sheath inside of an 80 cm Infinity guide catheter. The Infinity guide sheath  could only be advanced into the proximal 1/3 of the right vertebral artery with further advancement resulting in herniation of the platform into the aortic arch. The guide sheath was then exchanged for a 300 cm 014 inch BMW support guidewire in the V3 region of the right vertebral artery. An 021 160 cm microcatheter inside of a 132 cm 055 Zoom aspiration catheter was advanced without difficulty to the proximal basilar artery. The micro guidewire was then gently advanced without difficulty into the left posterior cerebral artery P2 region followed by the microcatheter. The guidewire was removed. Good aspiration was obtained from the hub of the microcatheter which was connected to continuous heparinized saline infusion. At this time, the Zoom aspiration catheter was advanced without difficulty into the proximal P2 region. A 4 mm x 40 mm Solitaire X retrieval device was then deployed. Thereafter, the proximal aspiration at the hub of the Zoom aspiration catheter with a Penumbra pump, and at the hub of the [place] guide catheter with a 20 mL syringe, the combination of the retrieval device, the Zoom aspiration catheter was retrieved and removed. A control arteriogram performed through the Infinity guide catheter in the proximal right vertebral artery demonstrated revascularization of the distal basilar artery with opacification of the bilaterally duplicated superior cerebellar artery branches, the anterior-inferior cerebellar arteries, and also of the left posterior cerebral artery. The right PCA opacified only transiently due to inflow from the inferior circulation via the posterior communicating artery. A TICI 2C was achieved. A final  control arteriogram performed through the Infinity guide catheter in the right subclavian artery demonstrates the right vertebral artery to be widely patent extra cranially and intracranially. An 8 French Angio-Seal closure device was deployed for hemostasis at the right groin puncture site after removal of the Pinnacle sheath. Distal pulses remained Dopplerable in both feet unchanged. CT brain demonstrated no evidence of intracranial hemorrhage. The patient was left intubated due to her medical condition to protect the airway. She was then transferred to the PACU and then neuro ICU for post revascularization care. IMPRESSION: Endovascular complete revascularization of occluded distal basilar artery with 1 pass with a 4 mm x 40 mm Solitaire X retrieval device and contact aspiration achieving a TICI 2C revascularization. PLAN: Follow-up CT angiogram of the head and neck in six-months. Electronically Signed   By: Luanne Bras M.D.   On: 01/08/2022 07:44   IR CT Head Ltd  Result Date: 01/08/2022 INDICATION: History of acute change in mental status, and diffuse weakness and left sided nystagmus. 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 EXAM: 1. EMERGENT LARGE VESSEL OCCLUSION THROMBOLYSIS (POSTERIOR CIRCULATION) COMPARISON:  None Available. MEDICATIONS: Ancef 2 g IV antibiotic was administered within 1 hour of the procedure. ANESTHESIA/SEDATION: General anesthesia. CONTRAST:  Omnipaque 300 approximately 90 cc. FLUOROSCOPY TIME:  Fluoroscopy Time: 53 minutes 12 seconds (1411 mGy). COMPLICATIONS: None immediate. TECHNIQUE: Following a full explanation of the procedure along with the potential associated complications, an informed witnessed consent was obtained. The risks of intracranial hemorrhage of 10%, worsening neurological deficit, ventilator dependency, death and inability to revascularize were all reviewed in detail with the patient's husband. The patient was then  put under general anesthesia by the Department of Anesthesiology at [facility]. The right groin was prepped and draped in the usual sterile fashion. Thereafter using modified Seldinger technique, transfemoral access into the right common femoral artery was obtained without difficulty. Over a 0.035 inch guidewire an 8 [place] 15 cm  French Pinnacle sheath was inserted. Through this, and also over a 0.035 inch guidewire a 5.5 French 125 cm Berenstein support catheter inside of an 088 95 cm Neuron Max sheath was advanced to the aortic arch region. The support catheter was then advanced into the right subclavian artery just proximal to the origin of the right vertebral artery. The guidewire was removed. Good aspiration was obtained from the hub of the Neuron Max sheath. Control arteriogram at the origin of the right vertebral artery was then performed centered extra cranially and intracranially. FINDINGS: The right vertebral artery origin demonstrated patency proximally, with a moderate kink distal to this. More distally, the vessel is seen to opacify to the cranial skull base. Contrast was seen extending into the proximal basilar artery. PROCEDURE: Attempts were made to advanced an intermediary catheter through the right vertebral artery distally with an 035 inch guidewire, 024 micro guidewire with an 027 microcatheter without success due to the extreme of the innominate artery origin from the aortic arch resulting in herniation of the platform into the aortic arch. Eventually access was obtained using a 125 cm 4 French sheath inside of an 80 cm Infinity guide catheter. The Infinity guide sheath could only be advanced into the proximal 1/3 of the right vertebral artery with further advancement resulting in herniation of the platform into the aortic arch. The guide sheath was then exchanged for a 300 cm 014 inch BMW support guidewire in the V3 region of the right vertebral artery. An 021 160 cm microcatheter  inside of a 132 cm 055 Zoom aspiration catheter was advanced without difficulty to the proximal basilar artery. The micro guidewire was then gently advanced without difficulty into the left posterior cerebral artery P2 region followed by the microcatheter. The guidewire was removed. Good aspiration was obtained from the hub of the microcatheter which was connected to continuous heparinized saline infusion. At this time, the Zoom aspiration catheter was advanced without difficulty into the proximal P2 region. A 4 mm x 40 mm Solitaire X retrieval device was then deployed. Thereafter, the proximal aspiration at the hub of the Zoom aspiration catheter with a Penumbra pump, and at the hub of the [place] guide catheter with a 20 mL syringe, the combination of the retrieval device, the Zoom aspiration catheter was retrieved and removed. A control arteriogram performed through the Infinity guide catheter in the proximal right vertebral artery demonstrated revascularization of the distal basilar artery with opacification of the bilaterally duplicated superior cerebellar artery branches, the anterior-inferior cerebellar arteries, and also of the left posterior cerebral artery. The right PCA opacified only transiently due to inflow from the inferior circulation via the posterior communicating artery. A TICI 2C was achieved. A final control arteriogram performed through the Infinity guide catheter in the right subclavian artery demonstrates the right vertebral artery to be widely patent extra cranially and intracranially. An 8 French Angio-Seal closure device was deployed for hemostasis at the right groin puncture site after removal of the Pinnacle sheath. Distal pulses remained Dopplerable in both feet unchanged. CT brain demonstrated no evidence of intracranial hemorrhage. The patient was left intubated due to her medical condition to protect the airway. She was then transferred to the PACU and then neuro ICU for post  revascularization care. IMPRESSION: Endovascular complete revascularization of occluded distal basilar artery with 1 pass with a 4 mm x 40 mm Solitaire X retrieval device and contact aspiration achieving a TICI 2C revascularization. PLAN: Follow-up CT angiogram of the head and neck in  six-months. Electronically Signed   By: Luanne Bras M.D.   On: 01/08/2022 07:44   ECHOCARDIOGRAM COMPLETE  Result Date: 01/07/2022    ECHOCARDIOGRAM REPORT   Patient Name:   Cassandra Alexander Date of Exam: 01/07/2022 Medical Rec #:  [id number]     Height:       60.0 in Accession #:    [id number]    Weight:       112.9 lb Date of Birth:  [birth year]     BSA:          1.464 m Patient Age:    61 years      BP:           135/66 mmHg Patient Gender: F             HR:           74 bpm. Exam Location:  Inpatient Procedure: 2D Echo, Color Doppler and Cardiac Doppler Indications:    Stroke  History:        Patient has prior history of Echocardiogram examinations, most                 recent 09/26/2021. TIA; Risk Factors:Dyslipidemia.  Sonographer:    Memory Argue Referring Phys: [place]me  1. Left ventricular ejection fraction, by estimation, is 60 to 65%. The left ventricle has normal function. The left ventricle has no regional wall motion abnormalities. Left ventricular diastolic parameters are indeterminate.  2. Right ventricular systolic function is normal. The right ventricular size is normal.  3. The mitral valve is normal in structure. Trivial mitral valve regurgitation. No evidence of mitral stenosis.  4. The aortic valve is tricuspid. Aortic valve regurgitation is not visualized. No aortic stenosis is present.  5. The inferior vena cava is normal in size with greater than 50% respiratory variability, suggesting right atrial pressure of 3 mmHg. FINDINGS  Left Ventricle: Left ventricular ejection fraction, by estimation, is 60 to 65%. The left ventricle has normal function. The left ventricle has no  regional wall motion abnormalities. The left ventricular internal cavity size was normal in size. There is  no left ventricular hypertrophy. Left ventricular diastolic parameters are indeterminate. Right Ventricle: The right ventricular size is normal. No increase in right ventricular wall thickness. Right ventricular systolic function is normal. Left Atrium: Left atrial size was normal in size. Right Atrium: Right atrial size was normal in size. Pericardium: There is no evidence of pericardial effusion. Presence of epicardial fat layer. Mitral Valve: The mitral valve is normal in structure. Trivial mitral valve regurgitation. No evidence of mitral valve stenosis. Tricuspid Valve: The tricuspid valve is normal in structure. Tricuspid valve regurgitation is not demonstrated. No evidence of tricuspid stenosis. Aortic Valve: The aortic valve is tricuspid. Aortic valve regurgitation is not visualized. No aortic stenosis is present. Aortic valve mean gradient measures 2.0 mmHg. Aortic valve peak gradient measures 4.5 mmHg. Aortic valve area, by VTI measures 2.67 cm. Pulmonic Valve: The pulmonic valve was normal in structure. Pulmonic valve regurgitation is not visualized. No evidence of pulmonic stenosis. Aorta: The aortic root is normal in size and structure. Venous: The inferior vena cava is normal in size with greater than 50% respiratory variability, suggesting right atrial pressure of 3 mmHg. IAS/Shunts: No atrial level shunt detected by color flow Doppler.  LEFT VENTRICLE PLAX 2D LVIDd:         4.20 cm   Diastology LVIDs:  3.00 cm   LV e' medial:    6.37 cm/s LV PW:         0.90 cm   LV E/e' medial:  10.8 LV IVS:        1.00 cm   LV e' lateral:   6.84 cm/s LVOT diam:     2.00 cm   LV E/e' lateral: 10.1 LV SV:         50 LV SV Index:   34 LVOT Area:     3.14 cm  RIGHT VENTRICLE RV S prime:     14.80 cm/s LEFT ATRIUM             Index        RIGHT ATRIUM          Index LA Vol (A2C):   27.5 ml 18.79 ml/m  RA  Area:     7.19 cm LA Vol (A4C):   21.3 ml 14.55 ml/m  RA Volume:   12.10 ml 8.27 ml/m LA Biplane Vol: 26.6 ml 18.17 ml/m  AORTIC VALVE AV Area (Vmax):    2.64 cm AV Area (Vmean):   2.48 cm AV Area (VTI):     2.67 cm AV Vmax:           106.00 cm/s AV Vmean:          70.000 cm/s AV VTI:            0.186 m AV Peak Grad:      4.5 mmHg AV Mean Grad:      2.0 mmHg LVOT Vmax:         89.20 cm/s LVOT Vmean:        55.200 cm/s LVOT VTI:          0.158 m LVOT/AV VTI ratio: 0.85  AORTA Ao Root diam: 3.20 cm Ao Asc diam:  3.10 cm MITRAL VALVE MV Area (PHT): 3.65 cm    SHUNTS MV Decel Time: 208 msec    Systemic VTI:  0.16 m MV E velocity: 68.80 cm/s  Systemic Diam: 2.00 cm MV A velocity: 91.50 cm/s MV E/A ratio:  0.75 Kardie Tobb DO Electronically signed by Berniece Salines DO Signature Date/Time: 01/07/2022/2:15:40 PM    Final    MR BRAIN WO CONTRAST  Result Date: 01/07/2022 CLINICAL DATA:  Stroke follow-up EXAM: MRI HEAD WITHOUT CONTRAST TECHNIQUE: Multiplanar, multiecho pulse sequences of the brain and surrounding structures were obtained without intravenous contrast. COMPARISON:  CTA head/neck 01/06/22 FINDINGS: Brain: Acute infarcts in the bilateral cerebellar hemispheres (series 3, image 14). Small foci of acute infarcts are also seen in the pons (series 3, image 16). There is no evidence of hemorrhage. Sequela of moderate chronic microvascular ischemic change with a chronic infarct in the right centrum semiovale. There is incomplete suppression of fluid signal on FLAIR imaging in the bilateral parietal and occipital lobes. These areas do not demonstrate signal on susceptibility weighted imaging. No hydrocephalus. No extra-axial fluid collection. Vascular: There is diminished flow void at the tip of the basilar artery (series 6, image 10), nonspecific. Skull and upper cervical spine: Normal marrow signal. Sinuses/Orbits: Bilateral lens replacement. Mild mucosal thickening bilateral sphenoid sinuses. Other: None.  IMPRESSION: 1. 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 No evidence of hemorrhage. 2. Diminished flow void at the tip of the basilar artery, nonspecific, but possibly secondary to recent intervention. 3. Incomplete suppression of fluid signal on FLAIR imaging in the bilateral parietal and occipital lobes, which is favored to  be artifactual. Electronically Signed   By: Marin Roberts M.D.   On: 01/07/2022 10:00   DG Chest Port 1 View  Result Date: 01/07/2022 CLINICAL DATA:  Provided history: ETT.  Stroke. EXAM: PORTABLE CHEST 1 VIEW COMPARISON:  Prior chest radiographs 01/06/2022 and earlier. Chest CT 05/01/2021. FINDINGS: ET tube present with tip terminating 2.4 cm above the level of the carina. An enteric tube passes below the level left hemidiaphragm. The tip of the enteric tube or the incompletely imaged side-port is present at the level of the gastric body. A loop recorder device overlies the left chest. Heart size within normal limits. Aortic atherosclerosis. Persistent mild ill-defined opacity within the right lung base. Mild atelectasis within the left lung base. The 1.4 cm focus of nodular airspace consolidation demonstrated within the left upper lobe on the prior chest CT of 05/01/2021 is occult on the current examination. Mild chronic elevation of the right hemidiaphragm. No evidence of pleural effusion or pneumothorax. No acute bony abnormality identified. IMPRESSION: Support apparatus as described. Persistent mild ill-defined opacity within the right lung base. This has an appearance favoring atelectasis. However, aspiration/pneumonia cannot be excluded. Mild atelectasis within the left lung base. The 1.4 cm focus of nodular airspace consolidation demonstrated within the left upper lobe on the prior chest CT of 05/01/2021 is occult on the current examination. Please refer to this prior examination for further description and for follow-up recommendations. Electronically Signed    By: Kellie Simmering D.O.   On: 01/07/2022 09:16   DG Abd Portable 1V  Result Date: 01/06/2022 CLINICAL DATA:  Enteric catheter placement EXAM: PORTABLE ABDOMEN - 1 VIEW COMPARISON:  None Available. FINDINGS: Frontal view of the lower chest and upper abdomen demonstrates enteric catheter passing below diaphragm tip and side port projecting over the gastric body. Lung bases are clear. Excreted contrast within the kidneys. Bowel gas pattern is unremarkable. IMPRESSION: 1. Enteric catheter tip and side port projecting over gastric body. Electronically Signed   By: Randa Ngo M.D.   On: 01/06/2022 18:19   DG Chest Portable 1 View  Result Date: 01/06/2022 CLINICAL DATA:  Provided history: Altered mental status. Code stroke. EXAM: PORTABLE CHEST 1 VIEW COMPARISON:  Chest radiographs 02/28/2020 and earlier. Chest CT 05/01/2021. FINDINGS: A loop recorder device overlies the left chest. Heart size within normal limits. Aortic atherosclerosis. Mild atelectasis within the right lung base. The 1.4 cm focus of nodular airspace consolidation demonstrated within the left upper lobe on the prior chest CT of 05/01/2021 is occult on the current exam. Mild chronic elevation of the right hemidiaphragm. No evidence of pleural effusion or pneumothorax. No acute bony abnormality identified. Levocurvature of the thoracic spine. Thoracic spondylosis. IMPRESSION: Mild atelectasis within the right lung base. The 1.4 cm focus of nodular airspace consolidation demonstrated within the left upper lobe on the prior chest CT of 05/01/2021 is occult on the current examination. Please refer to this prior examination for further description and for follow-up recommendations. Aortic Atherosclerosis (ICD10-I70.0). Electronically Signed   By: Kellie Simmering D.O.   On: 01/06/2022 09:16   CT ANGIO HEAD NECK W WO CM W PERF (CODE STROKE)  Result Date: 01/06/2022 CLINICAL DATA:  Neuro deficit, acute, stroke suspected. EXAM: CT ANGIOGRAPHY HEAD  AND NECK CT PERFUSION BRAIN TECHNIQUE: Multidetector CT imaging of the head and neck was performed using the standard protocol during bolus administration of intravenous contrast. Multiplanar CT image reconstructions and MIPs were obtained to evaluate the vascular anatomy. Carotid stenosis measurements (when  applicable) are obtained utilizing NASCET criteria, using the distal internal carotid diameter as the denominator. Multiphase CT imaging of the brain was performed following IV bolus contrast injection. Subsequent parametric perfusion maps were calculated using RAPID software. RADIATION DOSE REDUCTION: This exam was performed according to the departmental dose-optimization program which includes automated exposure control, adjustment of the mA and/or kV according to patient size and/or use of iterative reconstruction technique. CONTRAST:  169m OMNIPAQUE IOHEXOL 350 MG/ML SOLN COMPARISON:  Head CT same day. MRI 09/25/2021. CT angiography 09/25/2021. FINDINGS: CTA NECK FINDINGS Aortic arch: Aortic atherosclerosis. Innominate artery origin not included on the scan. Small left vertebral artery arises directly from the arch Right carotid system: Common carotid artery widely patent to the bifurcation. Carotid bifurcation is normal without soft or calcified plaque. Cervical ICA is normal. Left carotid system: Common carotid artery widely patent to the bifurcation. Very minimal plaque at the bifurcation but no stenosis or irregularity. Cervical ICA widely patent. Vertebral arteries: Dominant right vertebral artery arises normally from the subclavian artery and is widely patent through the cervical region to the foramen magnum. As noted above, left vertebral artery is a tiny vessel that arises from the arch in actually has a second origin at the left subclavian artery, the 2 branches coming together in the transverse foramen of the lower cervical spine. Beyond that, the vessel is patent to the foramen magnum. Skeleton:  Scoliosis without significant degenerative change. Other neck: No mass or lymphadenopathy. Upper chest: Emphysema without acute process. Review of the MIP images confirms the above findings CTA HEAD FINDINGS Anterior circulation: Right internal carotid artery widely patent through the skull base and siphon region. 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 Limited evaluation because of dense streak artifact the anterior and middle cerebral vessels are patent. No large vessel occlusion or proximal stenosis. Left internal carotid arteries widely patent through the skull base and siphon region. The anterior and middle cerebral vessels are normal without aneurysm or stenosis. Posterior circulation: Dominant right vertebral artery is widely patent to the basilar artery. Right PICA shows flow. Non dominant left vertebral artery is occluded in the V4 segment. The basilar artery shows flow within both anterior inferior cerebellar arteries. There is an embolus at the basilar tip. Flow is seen within both posterior cerebral arteries. Venous sinuses: Patent and normal Anatomic variants: None significant otherwise Review of the MIP images confirms the above findings CT Brain Perfusion Findings: ASPECTS: 10 CBF (<30%) Volume: 042mPerfusion (Tmax>6.0s) volume: 21m56mismatch Volume: 21mL37mfarction Location:None demonstrated IMPRESSION: 1. 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 Basilar artery shows flow proximally but there is an embolus at the basilar tip. Some flow is seen within both posterior cerebral arteries. 2. 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 Limited evaluation because of dense streak artifact. No anterior circulation large vessel occlusion or proximal stenosis. 3. No carotid bifurcation disease. 4. No evidence of infarction by CT perfusion imaging. 5. Aortic atherosclerosis. 6. Emphysema without acute process. 7. The left vertebral  artery has 2 origins, 1 arising directly from the aortic arch and a second origin at the left subclavian artery, the 2 branches coming together in the transverse foramen of the lower cervical spine. Beyond that, the vessel is patent to the foramen magnum. Left V4 occlusion described above. Aortic Atherosclerosis (ICD10-I70.0) and Emphysema (ICD10-J43.9). Case discussed with Dr. StacQuinn Axeing performance. Basilar tip embolic occlusion communicated at approximally 0905 hours via Amion.  Electronically Signed   By: Nelson Chimes M.D.   On: 01/06/2022 09:07   CT HEAD CODE STROKE WO CONTRAST  Result Date: 01/06/2022 CLINICAL DATA:  Code stroke.  Neuro deficit, acute, stroke suspected EXAM: CT HEAD WITHOUT CONTRAST TECHNIQUE: Contiguous axial images were obtained from the base of the skull through the vertex without intravenous contrast. RADIATION DOSE REDUCTION: This exam was performed according to the departmental dose-optimization program which includes automated exposure control, adjustment of the mA and/or kV according to patient size and/or use of iterative reconstruction technique. COMPARISON:  CT 09/25/2021. FINDINGS: Brain: No evidence of acute large vascular territory infarction, hemorrhage, hydrocephalus, extra-axial collection or mass lesion/mass effect. Similar small scattered remote infarcts. Vascular: Embolization coils in the right paraclinoid ICA. Skull: No acute fracture. Sinuses/Orbits: No acute finding. Other: No mastoid effusions. ASPECTS [facility] Stroke Program Early CT Score) total score (0-10 with 10 being normal): 10. IMPRESSION: 1. No evidence of acute intracranial abnormality. 2. ASPECTS is 10. Code stroke imaging results were communicated on 01/06/2022 at 8:30 am to provider [facility] via secure text paging. Electronically Signed   By: Margaretha Sheffield M.D.   On: 01/06/2022 08:30   CUP PACEART REMOTE DEVICE CHECK  Result Date: 01/05/2022 ILR summary report received. Battery status OK.  Normal device function. No new symptom, tachy, brady, or pause episodes. No new AF episodes. Monthly summary reports and ROV/PRN LA   Labs:  CBC: Recent Labs    09/25/21 1009 09/25/21 1033 01/06/22 0818 01/06/22 0823 01/06/22 1220 01/07/22 0428 01/13/22 1417  WBC 6.1  --  6.9  --   --  10.3 7.6  HGB 14.4   < > 12.9 13.3 12.9 12.2 8.1*  HCT 45.7   < > 40.3 39.0 38.0 35.8* 25.7*  PLT 312  --  204  --   --  234 235   < > = values in this interval not displayed.    COAGS: Recent Labs    09/25/21 1009 01/06/22 0818  INR 1.0 1.0  APTT 28 23*    BMP: Recent Labs    01/06/22 0818 01/06/22 0823 01/06/22 1220 01/07/22 0428 01/08/22 0522 01/13/22 1417  NA 139 141 137 140 140 143  K 4.0 4.1 3.5 3.5 3.3* 3.5  CL 103 103  --  105 109 102  CO2 27  --   --  25 24 29   GLUCOSE 156* 149*  --  97 127* 142*  BUN 10 11  --  6* 6* 11  CALCIUM 8.8*  --   --  8.7* 8.5* 9.3  CREATININE 0.66 0.60  --  0.53 0.56 0.60  GFRNONAA >60  --   --  >60 >60 >60    LIVER FUNCTION TESTS: Recent Labs    09/25/21 1009 01/06/22 0818 01/07/22 0428 01/13/22 1417  BILITOT 0.7 0.8 0.6 0.8  AST 19 19 19 20   ALT 12 21 17 14   ALKPHOS 55 53 51 52  PROT 7.0 6.1* 5.7* 6.2*  ALBUMIN 4.1 3.7 3.4* 3.4*    TUMOR MARKERS: No results for input(s): "AFPTM", "CEA", "CA199", "CHROMGRNA" in the last 8760 hours.  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   Patient seen at bedside with Dr. Estanislado Pandy.  She is on aspirin and Plavix at home. She is also enrolled in a clinical trial with [facility].  CT Abdomen Pelvis ordered by Dr. Zenia Resides.  Will also order a Vascular US to rule out pseudoaneurysm.  Recommend holding Plavix for 5 days.  Resume per discretion of ordering/managing MD due to remote history of aneurysm. Continue aspirin at this time.  Will follow imaging.  Of note, Hgb did decrease from 12  8.1.    Thank you for this  interesting consult.  I greatly enjoyed meeting ALEXSANDRIA KIVETT and look forward to participating in their care.  A copy of this report was sent to the requesting provider on this date.  Electronically Signed: Docia Barrier, PA 01/13/2022, 3:37 PM   I spent a total of 20 Minutes    in face to face in clinical consultation, greater than 50% of which was counseling/coordinating care for groin hematoma.

## 2022-01-13 NOTE — Progress Notes (Signed)
Right lower extremity pseudoaneurysm compression study completed.   Please see CV Proc for preliminary results.   Darlin Coco, RDMS, RVT

## 2022-01-13 NOTE — ED Provider Notes (Signed)
Care was taken over from Dr. Zenia Resides.  Patient was noted to have a pseudoaneurysm in her right groin after recent IR procedure for stroke.  Patient was waiting on vascular consult and CT scan.  Dr. Carlis Abbott with vascular surgery was able to do a bedside ultrasound and compressed the pseudoaneurysm.  He states that he was able to almost completely compressed the pseudoaneurysm.  She was given 1 unit of packed red cells.  There was a significant delay in getting the CT scan.  Initially there was a delay in taking her over.  Then there was another delay as a vascular surgeon was doing his procedure.  There was a third delay as the patient was getting blood and she did not have second IV access.  Eventually the CT scan was performed and shows a hematoma in the area of the pseudoaneurysm.  There was some concern for active leak from the superficial femoral artery.  I reconsulted Dr. Carlis Abbott.  He looked at the images and feels this is from the small area that he was not able to completely compress.  He does not feel that any further intervention is indicated currently.  He plans on doing a repeat ultrasound in the morning.  I spoke with Dr. Jonelle Sidle who will admit the patient for further treatment.   Malvin Johns, MD 01/13/22 2121

## 2022-01-13 NOTE — ED Notes (Signed)
Pt taken to CT with this RN.

## 2022-01-13 NOTE — ED Notes (Signed)
Holding on transfusing blood per the Doctor until he finishes evaluating/treating her.

## 2022-01-13 NOTE — Telephone Encounter (Signed)
Oceanic Stroke [employer] Note   Research office received a call from the patient's husband today stating that she has been having some pain in the right groin as well as some bruising and discoloration of her upper thigh ever since she has gone home..  This was discussed with me and I advised the patient to come into the office for evaluation. On evaluation patient was found to be quite pale.  Vital signs and blood pressure appear to be stable.  Patient neurological exam was nonfocal but NIH stroke scale of 0.  The patient's husband showed me photographs showing significant ecchymosis over her upper medial thigh. I have a strong suspicion of patient having retroperitoneal hematoma and groin bleeding from femoral artery access site for her mechanical thrombectomy 1 week ago. I recommended the patient be referred to the emergency room for urgent labs as well as evaluation of the groin look for pseudoaneurysm versus retroperitoneal hematoma and appropriate management with vascular surgery as ordered interventional radiology. Because emergency line and an ambulance to take the patient to the ER for evaluation.  I personally called and spoke to the ER charge nurse to expedite the care of the patient after she gets there Patient was advised to hold aspirin, Plavix and Oceanic stroke study medication until further notice. till she is medically stable. I had a long discussion with the patient and her husband and discussed evaluation and treatment plan and answered questions.  Antony Contras, MD

## 2022-01-13 NOTE — Progress Notes (Signed)
Interventional Radiology Brief Note:  [place] confirms pseudoaneurysm.  Reviewed with IR MD who recommends proceeding with CTA Abdomen/Pelvis to rule out RP hematoma, then likely for thrombin injection in IR tomorrow.  Does not need to be NPO.   Brynda Greathouse, MS RD PA-C

## 2022-01-13 NOTE — ED Provider Notes (Signed)
[facility] EMERGENCY DEPARTMENT Provider Note   CSN: [id number] Arrival date & time: 01/13/22  1310     History  Chief Complaint  Patient presents with   abnormal labs    Fatigue   Leg Pain    Cassandra Alexander is a 76 y.o. female.  76 year old female presents with concern for possible anemia as well as possible retroperitoneal bleed.  Patient had a stroke last week and had a thrombectomy via a right femoral groin line.  States that she has had bruising and pain to her right groin and thigh since the operation.  Was seen by neurology today who felt that patient looked anemic.  Patient denies any numbness or tingling to her right foot.  Patient is on Plavix, aspirin, and experimental drug.  Patient has been dizzy and lightheaded.  Center for further management       Home Medications Prior to Admission medications   Medication Sig Start Date End Date Taking? Authorizing Provider  aspirin 81 MG chewable tablet Chew 1 tablet (81 mg total) by mouth daily. 01/09/22   Rikki Spearing, NP  atorvastatin (LIPITOR) 80 MG tablet Take 1 tablet (80 mg total) by mouth daily. 01/08/22 01/03/23  Rikki Spearing, NP  citalopram (CELEXA) 10 MG tablet Take 10 mg by mouth at bedtime.    [provider]  clopidogrel (PLAVIX) 75 MG tablet Take 1 tablet (75 mg total) by mouth daily. 01/09/22   Rikki Spearing, NP  imipramine (TOFRANIL) 50 MG tablet Take 2 tablets by mouth at bedtime.    [provider]  lisinopril (ZESTRIL) 10 MG tablet Take 10 mg by mouth daily. 09/03/21   [provider]  mesalamine (LIALDA) 1.2 g EC tablet Take 2.4 g by mouth daily with breakfast. 04/23/21   [provider]  Study - OCEANIC-STROKE - asundexian 50 mg or placebo tablet (PI-Sethi) Take 1 tablet (50 mg total) by mouth daily. For Investigational Use Only. Take 1 tablet my mouth daily at the same time each day (preferably in the morning). Tablet should be swallowed whole  with water; it CANNOT be crushed or broken. Please bring your bottle with you to each research visit. Please contact [facility] for any questions or concerns. 01/08/22   Rikki Spearing, NP      Allergies    Cortisone and Penicillins    Review of Systems   Review of Systems  All other systems reviewed and are negative.   Physical Exam Updated Vital Signs BP (!) 142/69 (BP Location: Right Arm)   Pulse 84   Temp (!) 97.5 F (36.4 C) (Oral)   Resp 20   Ht 1.499 m (4' 11" )   Wt 49 kg   SpO2 93%   BMI 21.81 kg/m  Physical Exam Vitals and nursing note reviewed.  Constitutional:      General: She is not in acute distress.    Appearance: Normal appearance. She is well-developed. She is not toxic-appearing.  HENT:     Head: Normocephalic and atraumatic.  Eyes:     General: Lids are normal.     Pupils: Pupils are equal, round, and reactive to light.     Comments: Pale conjunctiva  Neck:     Thyroid: No thyroid mass.     Trachea: No tracheal deviation.  Cardiovascular:     Rate and Rhythm: Normal rate and regular rhythm.     Heart sounds: Normal heart sounds. No murmur heard.  No gallop.  Pulmonary:     Effort: Pulmonary effort is normal. No respiratory distress.     Breath sounds: Normal breath sounds. No stridor. No decreased breath sounds, wheezing, rhonchi or rales.  Abdominal:     General: There is no distension.     Palpations: Abdomen is soft.     Tenderness: There is no abdominal tenderness. There is no rebound.  Musculoskeletal:        General: No tenderness. Normal range of motion.     Cervical back: Normal range of motion and neck supple.       Legs:  Skin:    General: Skin is warm and dry.     Coloration: Skin is pale.     Findings: No abrasion or rash.  Neurological:     Mental Status: She is alert and oriented to person, place, and time. Mental status is at baseline.     GCS: GCS eye subscore is 4. GCS verbal subscore is 5. GCS motor  subscore is 6.     Cranial Nerves: No cranial nerve deficit.     Sensory: No sensory deficit.     Motor: Motor function is intact.  Psychiatric:        Attention and Perception: Attention normal.        Speech: Speech normal.        Behavior: Behavior normal.     ED Results / Procedures / Treatments   Labs (all labs ordered are listed, but only abnormal results are displayed) Labs Reviewed  CBC WITH DIFFERENTIAL/PLATELET  COMPREHENSIVE METABOLIC PANEL  TYPE AND SCREEN    EKG None  Radiology No results found.  Procedures Procedures    Medications Ordered in ED Medications  0.9 %  sodium chloride infusion (has no administration in time range)    ED Course/ Medical Decision Making/ A&P                           Medical Decision Making Amount and/or Complexity of Data Reviewed Labs: ordered. Radiology: ordered.  Risk Prescription drug management.   Discussed case with Dr. Leonie Man from neurology as well and is Dr. Sula Rumple.  Concern for possible retroperitoneal bleed versus pseudoaneurysm.  Appropriate studies have been ordered for this.  Patient CBC significant for low hemoglobin 8.1.  Suspect is even lower than that.  Patient is symptomatic with his anemia.  Would likely need blood transfusion.  4:03 PM Patient ultrasound done of her right groin which shows a pseudoaneurysm.  Discussed with Dr.clark from vascular surgery will see the patient in consultation.  4:28 PM Patient will be transfused with a unit of blood due to her being symptomatic with her anemia.  Abdominal CT pending at this time.  Care turned over to Dr. Tamera Punt  CRITICAL CARE Performed by: Leota Jacobsen Total critical care time: 50 minutes Critical care time was exclusive of separately billable procedures and treating other patients. Critical care was necessary to treat or prevent imminent or life-threatening deterioration. Critical care was time spent personally by me on the following  activities: development of treatment plan with patient and/or surrogate as well as nursing, discussions with consultants, evaluation of patient's response to treatment, examination of patient, obtaining history from patient or surrogate, ordering and performing treatments and interventions, ordering and review of laboratory studies, ordering and review of radiographic studies, pulse oximetry and re-evaluation of patient's condition.         Final Clinical Impression(s) /  ED Diagnoses Final diagnoses:  None    Rx / DC Orders ED Discharge Orders     None         [name], MD 01/13/22 1628

## 2022-01-13 NOTE — Progress Notes (Signed)
Right groin pseudo check has been completed.   Preliminary results in CV Proc.   Cassandra Alexander 01/13/2022 3:39 PM

## 2022-01-13 NOTE — Consult Note (Signed)
Hospital Consult    Reason for Consult: Right common femoral artery pseudoaneurysm Referring Physician: ED MRN #:  [id number]  History of Present Illness: This is a 76 y.o. female that vascular surgery was consulted for a right femoral pseudoaneurysm.  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 Estanislado Pandy.  She has been noticing pain and swelling in her groin with discoloration since discharge last week.  Her procedure was on 01/06/2022.  She had a duplex in the ED showing a right common femoral pseudoaneurysm off the distal common femoral with no neck measuring about 2.5 cm.  Vascular surgery was consulted for management by the ED.  Of note she is also on a study medication called asundexian versus placebo in the [facility] stroke study in addition to Plavix.  Past Medical History:  Diagnosis Date   Anxiety    Cerebral aneurysm 2006   s/p  coiling right side  ("find during an MRI for headaches")   PCOM   Depression    Diverticula of colon    Headache 07/07/2015   Hyperlipidemia    IBS (irritable bowel syndrome)    Left sided numbness 07/07/2015   Osteoarthritis    knees, back, hands, hips   Primary localized osteoarthritis of left knee 05/16/2018   Primary localized osteoarthritis of left knee 05/16/2018   Pulmonary nodule 1 cm or greater in diameter 05/20/2021   S/P left unicompartmental knee replacement 05/16/2018   TIA (transient ischemic attack) 07/07/2015   Wears glasses     Past Surgical History:  Procedure Laterality Date   ANEURYSM COILING  2006 @[facility]    right PCOM   CESAREAN SECTION  x2  last one  1970s   COLONOSCOPY     IR CT HEAD LTD  01/06/2022   IR PERCUTANEOUS ART THROMBECTOMY/INFUSION INTRACRANIAL INC DIAG ANGIO  01/06/2022   LUMBAR FUSION  2017   --  dr Trenton Gammon per pt   PARTIAL KNEE ARTHROPLASTY Left 05/16/2018   Procedure: UNICOMPARTMENTAL KNEE;   Surgeon: Marchia Bond, MD;  Location: [facility];  Service: Orthopedics;  Laterality: Left;   RADIOLOGY WITH ANESTHESIA N/A 01/06/2022   Procedure: IR WITH ANESTHESIA;  Surgeon: Radiologist, Medication, MD;  Location: [facility];  Service: Radiology;  Laterality: N/A;   RECTAL PROLAPSE REPAIR  2004 approx.  @[facility]    TONSILLECTOMY AND ADENOIDECTOMY  age 76   TOTAL KNEE ARTHROPLASTY Right early 2000s   VAGINAL HYSTERECTOMY  eary age 47s   ovaries remain    Allergies  Allergen Reactions   Cortisone Swelling    Facial swelling Headache   Penicillins Rash    Childhood reaction Has patient had a PCN reaction causing immediate rash, facial/tongue/throat swelling, SOB or lightheadedness with hypotension: Yes Has patient had a PCN reaction causing severe rash involving mucus membranes or skin necrosis: No Has patient had a PCN reaction that required hospitalization No Has patient had a PCN reaction occurring within the last 10 years: No If all of the above answers are "NO", then may proceed with Cephalosporin use.     Prior to Admission medications   Medication Sig Start Date End Date Taking? Authorizing Provider  atorvastatin (LIPITOR) 80 MG tablet Take 1 tablet (80 mg total) by mouth daily. 01/08/22 01/03/23 Yes Rikki Spearing, NP  citalopram (CELEXA) 10 MG tablet Take 10 mg by mouth at bedtime.   Yes [provider]  imipramine (TOFRANIL) 50 MG tablet  Take 2 tablets by mouth at bedtime.   Yes [provider]  lisinopril (ZESTRIL) 10 MG tablet Take 10 mg by mouth daily. 09/03/21  Yes [provider]  mesalamine (LIALDA) 1.2 g EC tablet Take 2.4 g by mouth daily with breakfast. 04/23/21  Yes [provider]  nicotine polacrilex (NICORETTE) 2 MG gum Take 2 mg by mouth as needed for smoking cessation.   Yes [provider]  aspirin 81 MG chewable tablet Chew 1 tablet (81 mg total) by mouth daily. Patient not taking: Reported on 01/13/2022 01/09/22    Rikki Spearing, NP  clopidogrel (PLAVIX) 75 MG tablet Take 1 tablet (75 mg total) by mouth daily. Patient not taking: Reported on 01/13/2022 01/09/22   Rikki Spearing, NP  pantoprazole (PROTONIX) 20 MG tablet Take 20 mg by mouth daily. Patient not taking: Reported on 01/13/2022    [provider]  Study - OCEANIC-STROKE - asundexian 50 mg or placebo tablet (PI-Sethi) Take 1 tablet (50 mg total) by mouth daily. For Investigational Use Only. Take 1 tablet my mouth daily at the same time each day (preferably in the morning). Tablet should be swallowed whole with water; it CANNOT be crushed or broken. Please bring your bottle with you to each research visit. Please contact [facility] for any questions or concerns. Patient not taking: Reported on 01/13/2022 01/08/22   Rikki Spearing, NP    Social History   Socioeconomic History   Marital status: Married    Spouse name: Not on file   Number of children: Not on file   Years of education: Not on file   Highest education level: Not on file  Occupational History   Not on file  Tobacco Use   Smoking status: Former    Years: 20.00    Types: Cigarettes    Quit date: 03/15/1990    Years since quitting: 31.8   Smokeless tobacco: Never  Vaping Use   Vaping Use: Never used  Substance and Sexual Activity   Alcohol use: Not Currently   Drug use: Never   Sexual activity: Not on file  Other Topics Concern   Not on file  Social History Narrative   Not on file   Social Determinants of Health   Financial Resource Strain: Not on file  Food Insecurity: Not on file  Transportation Needs: Not on file  Physical Activity: Not on file  Stress: Not on file  Social Connections: Not on file  Intimate Partner Violence: Not on file     Family History  Problem Relation Age of Onset   Hypertension Mother    Stroke Mother    Osteoarthritis Father     ROS: [x]  Positive   [ ]  Negative   [ ]  All sytems reviewed and are  negative  Cardiovascular: []  chest pain/pressure []  palpitations []  SOB lying flat []  DOE []  pain in legs while walking []  pain in legs at rest []  pain in legs at night []  non-healing ulcers []  hx of DVT []  swelling in legs  Pulmonary: []  productive cough []  asthma/wheezing []  home O2  Neurologic: []  weakness in []  arms []  legs []  numbness in []  arms []  legs []  hx of CVA []  mini stroke [] difficulty speaking or slurred speech []  temporary loss of vision in one eye []  dizziness  Hematologic: []  hx of cancer []  bleeding problems []  problems with blood clotting easily  Endocrine:   []  diabetes []  thyroid disease  GI []  vomiting  blood []  blood in stool  GU: []  CKD/renal failure []  HD--[]  M/W/F or []  T/T/S []  burning with urination []  blood in urine  Psychiatric: []  anxiety []  depression  Musculoskeletal: []  arthritis []  joint pain  Integumentary: []  rashes []  ulcers  Constitutional: []  fever []  chills   Physical Examination  Vitals:   01/13/22 1400 01/13/22 1542  BP: (!) 145/66 135/65  Pulse: 81 75  Resp: (!) 22 18  Temp:  98.2 F (36.8 C)  SpO2: 94% 94%   Body mass index is 21.81 kg/m.  General:  NAD Gait: Not observed HENT: WNL, normocephalic Pulmonary: normal non-labored breathing Cardiac: regular, without  Murmurs, rubs or gallops Abdomen:  soft, NT/ND, no masses Vascular Exam/Pulses: Bilateral femoral pulses palpable with notable ecchymosis and bruising to the right groin and thigh Bilateral DP pulses palpable Extremities: without ischemic changes Musculoskeletal: no muscle wasting or atrophy  Neurologic: A&O X 3; Appropriate Affect ; SENSATION: normal; MOTOR FUNCTION:  moving all extremities equally. Speech is fluent/normal   CBC    Component Value Date/Time   WBC 7.6 01/13/2022 1417   RBC 2.63 (L) 01/13/2022 1417   HGB 8.1 (L) 01/13/2022 1417   HCT 25.7 (L) 01/13/2022 1417   PLT 235 01/13/2022 1417   MCV 97.7 01/13/2022  1417   MCH 30.8 01/13/2022 1417   MCHC 31.5 01/13/2022 1417   RDW 14.6 01/13/2022 1417   LYMPHSABS 1.4 01/13/2022 1417   MONOABS 0.7 01/13/2022 1417   EOSABS 0.0 01/13/2022 1417   BASOSABS 0.0 01/13/2022 1417    BMET    Component Value Date/Time   NA 143 01/13/2022 1417   K 3.5 01/13/2022 1417   CL 102 01/13/2022 1417   CO2 29 01/13/2022 1417   GLUCOSE 142 (H) 01/13/2022 1417   GLUCOSE 94 01/26/2006 1026   BUN 11 01/13/2022 1417   CREATININE 0.60 01/13/2022 1417   CALCIUM 9.3 01/13/2022 1417   GFRNONAA >60 01/13/2022 1417   GFRAA >60 05/17/2018 0516    COAGS: Lab Results  Component Value Date   INR 1.0 01/06/2022   INR 1.0 09/25/2021   INR 1.05 07/22/2015     Non-Invasive Vascular Imaging:    Duplex reviewed showing a 2.3 x 2.9 cm pseudoaneurysm off the distal right common femoral artery near the bifurcation of the SFA and profunda with essentially no neck only measuring about 1 x 1 mm.   ASSESSMENT/PLAN: This is a 76 y.o. female that vascular surgery was consulted for right common femoral pseudoaneurysm after transfemoral access for mechanical thrombectomy of an occluded distal basilar artery on 01/06/2022 by neuro IR when she had an acute stroke.  I have reviewed her duplex that showed about a 2.3 x 2.9 cm pseudoaneurysm off the distal right common femoral artery with essentially no neck.  Patient was medicated in the ED with IV pain medicine and manual pressure was held at the bedside with successful ultrasound-guided compression of about 98% of the right common femoral pseudoaneurysm.  There is a small amount of flow adjacent to the stick site that will hopefully thrombose.  We will repeat duplex tomorrow.  Please keep on bedrest as discussed with ED.  Marty Heck, MD [facility] Office: [place]

## 2022-01-13 NOTE — ED Notes (Signed)
Doctor is still in the room and states to start blood immediately after he leaves.

## 2022-01-13 NOTE — H&P (Signed)
History and Physical    Patient: Cassandra Alexander KZS:[id number] DOB: [birth year] DOA: 01/13/2022 DOS: the patient was seen and examined on 01/13/2022 PCP: Alroy Dust, L.Marlou Sa, MD  Patient coming from: {Point_of_Origin:[address]}  Chief Complaint:  Chief Complaint  Patient presents with   abnormal labs    Fatigue   Leg Pain   HPI: Cassandra Alexander is a 76 y.o. female with medical history significant of ***  Review of Systems: {ROS_Text:26778} Past Medical History:  Diagnosis Date   Anxiety    Cerebral aneurysm 2006   s/p  coiling right side  ("find during an MRI for headaches")   PCOM   Depression    Diverticula of colon    Headache 07/07/2015   Hyperlipidemia    IBS (irritable bowel syndrome)    Left sided numbness 07/07/2015   Osteoarthritis    knees, back, hands, hips   Primary localized osteoarthritis of left knee 05/16/2018   Primary localized osteoarthritis of left knee 05/16/2018   Pulmonary nodule 1 cm or greater in diameter 05/20/2021   S/P left unicompartmental knee replacement 05/16/2018   TIA (transient ischemic attack) 07/07/2015   Wears glasses    Past Surgical History:  Procedure Laterality Date   ANEURYSM COILING  2006 @[facility]    right PCOM   CESAREAN SECTION  x2  last one  1970s   COLONOSCOPY     IR CT HEAD LTD  01/06/2022   IR PERCUTANEOUS ART THROMBECTOMY/INFUSION INTRACRANIAL INC DIAG ANGIO  01/06/2022   LUMBAR FUSION  2017   --  dr Trenton Gammon per pt   PARTIAL KNEE ARTHROPLASTY Left 05/16/2018   Procedure: UNICOMPARTMENTAL KNEE;  Surgeon: Marchia Bond, MD;  Location: [facility];  Service: Orthopedics;  Laterality: Left;   RADIOLOGY WITH ANESTHESIA N/A 01/06/2022   Procedure: IR WITH ANESTHESIA;  Surgeon: Radiologist, Medication, MD;  Location: [facility];  Service: Radiology;  Laterality: N/A;   RECTAL PROLAPSE REPAIR  2004 approx.  @[facility]    TONSILLECTOMY AND ADENOIDECTOMY  age 52   TOTAL KNEE ARTHROPLASTY Right early 2000s   VAGINAL HYSTERECTOMY  eary age 23s   ovaries remain    Social History:  reports that she quit smoking about 31 years ago. Her smoking use included cigarettes. She has never used smokeless tobacco. She reports that she does not currently use alcohol. She reports that she does not use drugs.  Allergies  Allergen Reactions   Cortisone Swelling    Facial swelling Headache   Penicillins Rash    Childhood reaction Has patient had a PCN reaction causing immediate rash, facial/tongue/throat swelling, SOB or lightheadedness with hypotension: Yes Has patient had a PCN reaction causing severe rash involving mucus membranes or skin necrosis: No Has patient had a PCN reaction that required hospitalization No Has patient had a PCN reaction occurring within the last 10 years: No If all of the above answers are "NO", then may proceed with Cephalosporin use.     Family History  Problem Relation Age of Onset   Hypertension Mother    Stroke Mother    Osteoarthritis Father     Prior to Admission medications   Medication Sig Start Date End Date Taking? Authorizing Provider  atorvastatin (LIPITOR) 80 MG tablet Take 1 tablet (80 mg total) by mouth daily. 01/08/22 01/03/23 Yes Rikki Spearing, NP  citalopram (CELEXA) 10 MG tablet Take 10 mg by mouth at bedtime.   Yes [provider]  imipramine (TOFRANIL) 50 MG tablet Take 2 tablets by mouth at bedtime.  Yes [provider]  lisinopril (ZESTRIL) 10 MG tablet Take 10 mg by mouth daily. 09/03/21  Yes [provider]  mesalamine (LIALDA) 1.2 g EC tablet Take 2.4 g by mouth daily with breakfast. 04/23/21  Yes [provider]  nicotine polacrilex (NICORETTE) 2 MG gum Take 2 mg by mouth as needed for smoking cessation.   Yes [provider]  aspirin 81 MG chewable tablet Chew 1 tablet (81 mg total) by mouth daily. Patient not taking: Reported on 01/13/2022 01/09/22   Rikki Spearing, NP  clopidogrel (PLAVIX) 75 MG tablet Take 1 tablet (75 mg total) by mouth  daily. Patient not taking: Reported on 01/13/2022 01/09/22   Rikki Spearing, NP  pantoprazole (PROTONIX) 20 MG tablet Take 20 mg by mouth daily. Patient not taking: Reported on 01/13/2022    [provider]  Study - OCEANIC-STROKE - asundexian 50 mg or placebo tablet (PI-Sethi) Take 1 tablet (50 mg total) by mouth daily. For Investigational Use Only. Take 1 tablet my mouth daily at the same time each day (preferably in the morning). Tablet should be swallowed whole with water; it CANNOT be crushed or broken. Please bring your bottle with you to each research visit. Please contact [facility] for any questions or concerns. Patient not taking: Reported on 01/13/2022 01/08/22   Rikki Spearing, NP    Physical Exam: Vitals:   01/13/22 2015 01/13/22 2030 01/13/22 2045 01/13/22 2045  BP: 136/74 132/81 (!) 146/79 (!) 146/79  Pulse: (!) 25 91 80 81  Resp: 13 17 16 18   Temp:    97.9 F (36.6 C)  TempSrc:    Oral  SpO2: 91% 100% 97%   Weight:      Height:       *** Data Reviewed: {Tip this will not be part of the note when signed- Document your independent interpretation of telemetry tracing, EKG, lab, Radiology test or any other diagnostic tests. Add any new diagnostic test ordered today. (Optional):26781} {Results:26384}  Assessment and Plan: No notes have been filed under this hospital service. Service: Hospitalist     Advance Care Planning:   Code Status: Prior ***  Consults: ***  Family Communication: ***  Severity of Illness: {Observation/Inpatient:21159}  AuthorBarbette Merino, MD 01/13/2022 9:30 PM  For on call review [email or web address].

## 2022-01-13 NOTE — ED Triage Notes (Signed)
Pt BIB [facility] from a neuro follow up. Pt had a stroke on 01/06/2022 and had a thrombectomy done in the right groin. Pt began having pain 2 days ago and husband said she had some bruising and he took his eye off it for a few days and now feels like the bruising is worse. Per husband he said that the doctors office said she was anemic and might need blood. Pt states she has had no energy for the last week but was unsure if that was d/t the stroke.

## 2022-01-14 ENCOUNTER — Inpatient Hospital Stay (HOSPITAL_COMMUNITY): Payer: PPO

## 2022-01-14 DIAGNOSIS — D62 Acute posthemorrhagic anemia: Secondary | ICD-10-CM | POA: Diagnosis present

## 2022-01-14 DIAGNOSIS — Z7982 Long term (current) use of aspirin: Secondary | ICD-10-CM | POA: Diagnosis not present

## 2022-01-14 DIAGNOSIS — Z823 Family history of stroke: Secondary | ICD-10-CM | POA: Diagnosis not present

## 2022-01-14 DIAGNOSIS — Z981 Arthrodesis status: Secondary | ICD-10-CM | POA: Diagnosis not present

## 2022-01-14 DIAGNOSIS — I724 Aneurysm of artery of lower extremity: Secondary | ICD-10-CM | POA: Diagnosis present

## 2022-01-14 DIAGNOSIS — K219 Gastro-esophageal reflux disease without esophagitis: Secondary | ICD-10-CM | POA: Diagnosis present

## 2022-01-14 DIAGNOSIS — Z96653 Presence of artificial knee joint, bilateral: Secondary | ICD-10-CM | POA: Diagnosis present

## 2022-01-14 DIAGNOSIS — I119 Hypertensive heart disease without heart failure: Secondary | ICD-10-CM | POA: Diagnosis not present

## 2022-01-14 DIAGNOSIS — Z90710 Acquired absence of both cervix and uterus: Secondary | ICD-10-CM | POA: Diagnosis not present

## 2022-01-14 DIAGNOSIS — Z880 Allergy status to penicillin: Secondary | ICD-10-CM | POA: Diagnosis not present

## 2022-01-14 DIAGNOSIS — Z7902 Long term (current) use of antithrombotics/antiplatelets: Secondary | ICD-10-CM | POA: Diagnosis not present

## 2022-01-14 DIAGNOSIS — F32A Depression, unspecified: Secondary | ICD-10-CM | POA: Diagnosis present

## 2022-01-14 DIAGNOSIS — Z9079 Acquired absence of other genital organ(s): Secondary | ICD-10-CM | POA: Diagnosis not present

## 2022-01-14 DIAGNOSIS — Z87891 Personal history of nicotine dependence: Secondary | ICD-10-CM | POA: Diagnosis not present

## 2022-01-14 DIAGNOSIS — Z79899 Other long term (current) drug therapy: Secondary | ICD-10-CM | POA: Diagnosis not present

## 2022-01-14 DIAGNOSIS — E785 Hyperlipidemia, unspecified: Secondary | ICD-10-CM | POA: Diagnosis present

## 2022-01-14 DIAGNOSIS — Z8673 Personal history of transient ischemic attack (TIA), and cerebral infarction without residual deficits: Secondary | ICD-10-CM | POA: Diagnosis not present

## 2022-01-14 DIAGNOSIS — Y838 Other surgical procedures as the cause of abnormal reaction of the patient, or of later complication, without mention of misadventure at the time of the procedure: Secondary | ICD-10-CM | POA: Diagnosis present

## 2022-01-14 DIAGNOSIS — I10 Essential (primary) hypertension: Secondary | ICD-10-CM | POA: Diagnosis present

## 2022-01-14 DIAGNOSIS — T81718A Complication of other artery following a procedure, not elsewhere classified, initial encounter: Secondary | ICD-10-CM | POA: Diagnosis present

## 2022-01-14 DIAGNOSIS — Z888 Allergy status to other drugs, medicaments and biological substances status: Secondary | ICD-10-CM | POA: Diagnosis not present

## 2022-01-14 DIAGNOSIS — R911 Solitary pulmonary nodule: Secondary | ICD-10-CM | POA: Diagnosis present

## 2022-01-14 DIAGNOSIS — C434 Malignant melanoma of scalp and neck: Secondary | ICD-10-CM | POA: Diagnosis not present

## 2022-01-14 DIAGNOSIS — Z8249 Family history of ischemic heart disease and other diseases of the circulatory system: Secondary | ICD-10-CM | POA: Diagnosis not present

## 2022-01-14 DIAGNOSIS — F419 Anxiety disorder, unspecified: Secondary | ICD-10-CM | POA: Diagnosis present

## 2022-01-14 DIAGNOSIS — L7632 Postprocedural hematoma of skin and subcutaneous tissue following other procedure: Secondary | ICD-10-CM | POA: Diagnosis not present

## 2022-01-14 LAB — COMPREHENSIVE METABOLIC PANEL
ALT: 11 U/L (ref 0–44)
AST: 15 U/L (ref 15–41)
Albumin: 3 g/dL — ABNORMAL LOW (ref 3.5–5.0)
Alkaline Phosphatase: 47 U/L (ref 38–126)
Anion gap: 8 (ref 5–15)
BUN: 11 mg/dL (ref 8–23)
CO2: 25 mmol/L (ref 22–32)
Calcium: 8.3 mg/dL — ABNORMAL LOW (ref 8.9–10.3)
Chloride: 106 mmol/L (ref 98–111)
Creatinine, Ser: 0.54 mg/dL (ref 0.44–1.00)
GFR, Estimated: >60 mL/min (ref >60)
Glucose, Bld: 111 mg/dL — ABNORMAL HIGH (ref 70–99)
Potassium: 3.5 mmol/L (ref 3.5–5.1)
Sodium: 139 mmol/L (ref 135–145)
Total Bilirubin: 1.3 mg/dL — ABNORMAL HIGH (ref 0.3–1.2)
Total Protein: 5.4 g/dL — ABNORMAL LOW (ref 6.5–8.1)

## 2022-01-14 LAB — CBC
HCT: 26.6 % — ABNORMAL LOW (ref 36.0–46.0)
Hemoglobin: 8.5 g/dL — ABNORMAL LOW (ref 12.0–15.0)
MCH: 29.5 pg (ref 26.0–34.0)
MCHC: 32 g/dL (ref 30.0–36.0)
MCV: 92.4 fL (ref 80.0–100.0)
Platelets: 188 10*3/uL (ref 150–400)
RBC: 2.88 MIL/uL — ABNORMAL LOW (ref 3.87–5.11)
RDW: 18.1 % — ABNORMAL HIGH (ref 11.5–15.5)
WBC: 8.6 10*3/uL (ref 4.0–10.5)
nRBC: 0 % (ref 0.0–0.2)

## 2022-01-14 MED ORDER — POLYETHYLENE GLYCOL 3350 17 G PO PACK
17.0000 g | PACK | Freq: Every day | ORAL | Status: DC | PRN
  Administered 2022-01-16: 17 g via ORAL
  Filled 2022-01-14: qty 1

## 2022-01-14 MED ORDER — OXYCODONE HCL 5 MG PO TABS
5.0000 mg | ORAL_TABLET | Freq: Four times a day (QID) | ORAL | Status: DC | PRN
  Administered 2022-01-14 – 2022-01-16 (×6): 5 mg via ORAL
  Filled 2022-01-14 (×6): qty 1

## 2022-01-14 MED ORDER — ACETAMINOPHEN 325 MG PO TABS
650.0000 mg | ORAL_TABLET | Freq: Four times a day (QID) | ORAL | Status: DC | PRN
  Administered 2022-01-14: 650 mg via ORAL
  Filled 2022-01-14: qty 2

## 2022-01-14 MED ORDER — HYDROMORPHONE HCL 1 MG/ML IJ SOLN
0.5000 mg | INTRAMUSCULAR | Status: DC | PRN
  Administered 2022-01-15 – 2022-01-17 (×6): 0.5 mg via INTRAVENOUS
  Filled 2022-01-14 (×6): qty 0.5

## 2022-01-14 NOTE — Progress Notes (Signed)
Interventional Radiology Brief Note:  Interventional Radiology following for pseudoaneurysm after recent arterial puncture (10/25).  Imaging reviewed by Dr. Denna Haggard who notes PSA is largely thrombosed with active component near the SFA. Due to increased risk of reflux into the native artery with thrombin injection, recommend proceeding with intervention per Vascular Surgery who is already following the patient as well.   Patient to maintain follow-up with with [facility] service as previously planned.   No further needs in IR at this time.   Brynda Greathouse, MS RD PA-C

## 2022-01-14 NOTE — ED Notes (Signed)
ED TO INPATIENT HANDOFF REPORT  ED Nurse Name and Phone #:   S Name/Age/Gender Cassandra Alexander 76 y.o. female Room/Bed: 036C/036C  Code Status   Code Status: Full Code  Home/SNF/Other Home Patient oriented to: self, place, time, and situation Is this baseline? Yes   Triage Complete: Triage complete  Chief Complaint Femoral artery pseudo-aneurysm, right ([place]) [I72.4]  Triage Note Pt BIB [facility] from a neuro follow up. Pt had a stroke on 01/06/2022 and had a thrombectomy done in the right groin. Pt began having pain 2 days ago and husband said she had some bruising and he took his eye off it for a few days and now feels like the bruising is worse. Per husband he said that the doctors office said she was anemic and might need blood. Pt states she has had no energy for the last week but was unsure if that was d/t the stroke.    Allergies Allergies  Allergen Reactions   Cortisone Swelling    Facial swelling Headache   Penicillins Rash    Childhood reaction Has patient had a PCN reaction causing immediate rash, facial/tongue/throat swelling, SOB or lightheadedness with hypotension: Yes Has patient had a PCN reaction causing severe rash involving mucus membranes or skin necrosis: No Has patient had a PCN reaction that required hospitalization No Has patient had a PCN reaction occurring within the last 10 years: No If all of the above answers are "NO", then may proceed with Cephalosporin use.     Level of Care/Admitting Diagnosis ED Disposition     ED Disposition  Admit   Condition  --   [place]: [place] [[id number]]  Level of Care: Telemetry Cardiac [[id number]]  May place patient in observation at [facility] or [place] if equivalent level of care is available:: Yes  Covid Evaluation: Asymptomatic - no recent exposure (last 10 days) testing not required  Diagnosis: Femoral artery pseudo-aneurysm, right [facility]) [[id number]]  Admitting Physician:  [place], [place]  Attending Physician: Elwyn Reach [[id number]]          B Medical/Surgery History Past Medical History:  Diagnosis Date   Anxiety    Cerebral aneurysm 2006   s/p  coiling right side  ("find during an MRI for headaches")   PCOM   Depression    Diverticula of colon    Headache 07/07/2015   Hyperlipidemia    IBS (irritable bowel syndrome)    Left sided numbness 07/07/2015   Osteoarthritis    knees, back, hands, hips   Primary localized osteoarthritis of left knee 05/16/2018   Primary localized osteoarthritis of left knee 05/16/2018   Pulmonary nodule 1 cm or greater in diameter 05/20/2021   S/P left unicompartmental knee replacement 05/16/2018   TIA (transient ischemic attack) 07/07/2015   Wears glasses    Past Surgical History:  Procedure Laterality Date   ANEURYSM COILING  2006 @[facility]    right PCOM   CESAREAN SECTION  x2  last one  1970s   COLONOSCOPY     IR CT HEAD LTD  01/06/2022   IR PERCUTANEOUS ART THROMBECTOMY/INFUSION INTRACRANIAL INC DIAG ANGIO  01/06/2022   LUMBAR FUSION  2017   --  dr Trenton Gammon per pt   PARTIAL KNEE ARTHROPLASTY Left 05/16/2018   Procedure: UNICOMPARTMENTAL KNEE;  Surgeon: Marchia Bond, MD;  Location: [facility];  Service: Orthopedics;  Laterality: Left;   RADIOLOGY WITH ANESTHESIA N/A 01/06/2022   Procedure: IR WITH ANESTHESIA;  Surgeon: Radiologist,  Medication, MD;  Location: [facility];  Service: Radiology;  Laterality: N/A;   RECTAL PROLAPSE REPAIR  2004 approx.  @[facility]    TONSILLECTOMY AND ADENOIDECTOMY  age 38   TOTAL KNEE ARTHROPLASTY Right early 2000s   VAGINAL HYSTERECTOMY  eary age 67s   ovaries remain     A IV Location/Drains/Wounds Patient Lines/Drains/Airways Status     Active Line/Drains/Airways     Name Placement date Placement time Site Days   Peripheral IV 01/13/22 20 G Right Antecubital 01/13/22  1520  Antecubital  1   Peripheral IV 01/13/22 20 G Left Antecubital 01/13/22  1851  Antecubital  1   Incision (Closed)  03/14/15 Back 03/14/15  1648  -- 2498            Intake/Output Last 24 hours  Intake/Output Summary (Last 24 hours) at 01/14/2022 0235 Last data filed at 01/13/2022 2045 Gross per 24 hour  Intake 327 ml  Output --  Net 327 ml    Labs/Imaging Results for orders placed or performed during the hospital encounter of 01/13/22 (from the past 48 hour(s))  CBC with Differential/Platelet     Status: Abnormal   Collection Time: 01/13/22  2:17 PM  Result Value Ref Range   WBC 7.6 4.0 - 10.5 K/uL   RBC 2.63 (L) 3.87 - 5.11 MIL/uL   Hemoglobin 8.1 (L) 12.0 - 15.0 g/dL   HCT 25.7 (L) 36.0 - 46.0 %   MCV 97.7 80.0 - 100.0 fL   MCH 30.8 26.0 - 34.0 pg   MCHC 31.5 30.0 - 36.0 g/dL   RDW 14.6 11.5 - 15.5 %   Platelets 235 150 - 400 K/uL   nRBC 0.0 0.0 - 0.2 %   Neutrophils Relative % 71 %   Neutro Abs 5.4 1.7 - 7.7 K/uL   Lymphocytes Relative 18 %   Lymphs Abs 1.4 0.7 - 4.0 K/uL   Monocytes Relative 10 %   Monocytes Absolute 0.7 0.1 - 1.0 K/uL   Eosinophils Relative 0 %   Eosinophils Absolute 0.0 0.0 - 0.5 K/uL   Basophils Relative 0 %   Basophils Absolute 0.0 0.0 - 0.1 K/uL   Immature Granulocytes 1 %   Abs Immature Granulocytes 0.05 0.00 - 0.07 K/uL    Comment: Performed at [facility], [address], [place], [place] [address]  Comprehensive metabolic panel     Status: Abnormal   Collection Time: 01/13/22  2:17 PM  Result Value Ref Range   Sodium 143 135 - 145 mmol/L   Potassium 3.5 3.5 - 5.1 mmol/L   Chloride 102 98 - 111 mmol/L   CO2 29 22 - 32 mmol/L   Glucose, Bld 142 (H) 70 - 99 mg/dL    Comment: Glucose reference range applies only to samples taken after fasting for at least 8 hours.   BUN 11 8 - 23 mg/dL   Creatinine, Ser 0.60 0.44 - 1.00 mg/dL   Calcium 9.3 8.9 - 10.3 mg/dL   Total Protein 6.2 (L) 6.5 - 8.1 g/dL   Albumin 3.4 (L) 3.5 - 5.0 g/dL   AST 20 15 - 41 U/L   ALT 14 0 - 44 U/L   Alkaline Phosphatase 52 38 - 126 U/L   Total Bilirubin 0.8 0.3 - 1.2  mg/dL   GFR, Estimated >60 >60 mL/min    Comment: (NOTE) Calculated using the CKD-EPI Creatinine Equation (2021)    Anion gap 12 5 - 15    Comment: Performed at  [facility], [place] [address] [place], [place] [address]  Type and screen     Status: None (Preliminary result)   Collection Time: 01/13/22  2:17 PM  Result Value Ref Range   ABO/RH(D) A POS    Antibody Screen NEG    Sample Expiration 01/16/2022,2359    Unit Number [id number]    Blood Component Type RBC LR PHER1    Unit division 00    Status of Unit ISSUED    Transfusion Status OK TO TRANSFUSE    Crossmatch Result      Compatible Performed at [facility], [place] [address] [place], [place] [address]   Prepare RBC (crossmatch)     Status: None   Collection Time: 01/13/22  4:26 PM  Result Value Ref Range   Order Confirmation      ORDER PROCESSED BY BLOOD BANK Performed at [facility], [place] [address], [place], [place] [address]    CT Abdomen Pelvis W Contrast  Result Date: 01/13/2022 CLINICAL DATA:  Right groin bruising and pain following mechanical thrombectomy last week via right femoral line, procedure dated 01/06/2022. Concern for anemia and retroperitoneal bleed. Abdominal pain. EXAM: CT ABDOMEN AND PELVIS WITH CONTRAST TECHNIQUE: Multidetector CT imaging of the abdomen and pelvis was performed using the standard protocol following bolus administration of intravenous contrast. RADIATION DOSE REDUCTION: This exam was performed according to the departmental dose-optimization program which includes automated exposure control, adjustment of the mA and/or kV according to patient size and/or use of iterative reconstruction technique. CONTRAST:  43m OMNIPAQUE IOHEXOL 350 MG/ML SOLN COMPARISON:  PET-CT 06/08/2021, CT abdomen pelvis with IV contrast 03/17/2021, 09/06/2019. FINDINGS: Lower chest: Lung bases again demonstrate subsegmental linear atelectasis and a calcified left lower lobe posterior basal  granuloma. There is mild cardiomegaly. There are coronary artery calcifications. No pericardial effusion. Small hiatal hernia. Hepatobiliary: There is mild hepatic steatosis without mass enhancement. Gallbladder and bile ducts are unremarkable. Pancreas: No focal abnormality, ductal dilatation or inflammatory changes. Spleen: No focal abnormality or splenomegaly. Adrenals/Urinary Tract: There is no adrenal or renal cortical mass enhancement. Again, a thinly septated and otherwise homogeneous 5 cm cyst in the medial upper pole left kidney is noted measuring 19 Hounsfield units both initially in the delayed phase. This is unchanged in size dating back to 09/06/2019. No urinary stone or obstruction seen. No bladder thickening. Stomach/Bowel: Small hiatal hernia. Moderate thickened folds in the stomach including the antro pyloric area. The unopacified small bowel is unremarkable. Large volume of stool in the cecum, ascending and transverse colon again is noted. There are colonic diverticula without evidence of wall thickening or inflammatory change. An appendix is not seen in this patient. Patent sigmoid surgical anastomosis is again seen in the left lower abdominopelvic quadrant. Vascular/Lymphatic: On series 3 axial image 74 and coronal reconstruction image 31, there is a right groin hematoma overlying and compressing and significantly narrowing the proximal right superficial femoral artery and measuring 3.8 x 3.1 x 6.7 cm. There is a linear contrast leak into the hematoma from the anterior wall of the proximal right superficial femoral artery on 3:70 and 71 consistent with active bleed. The anterior compartment thigh musculature overlying this is asymmetrically larger with likely intramuscular hematoma, but this is difficult to measure as it is only partially imaged and is isodense. Clinical correlation is recommended to exclude elevated anterior thigh compartment pressures. Below this level the proximal superficial  femoral artery becomes normal in caliber. There is only mild  narrowing in the proximal deep right femoral artery, related to mass effect from the hematoma. Below the hematoma this artery is normal caliber also. No adenopathy is seen. No retroperitoneal hematoma. There is moderate aortoiliac calcific plaque without AAA. There are numerous pelvic phleboliths. Reproductive: Status post hysterectomy. No adnexal masses. Other: There is no free fluid, free hemorrhage or free air. There is no incarcerated hernia. Musculoskeletal: There is old L4-5 posterior fusion hardware with interbody metallic disc space apparatus, with lumbar S shaped scoliosis and advanced degenerative changes with osteopenia. No new bone abnormality. IMPRESSION: 1. 3.8 x 3.1 x 6.7 cm right groin hematoma overlying and compressing and significantly narrowing the proximal right superficial femoral artery. Infectious complication not excluded. 2. There is a linear contrast leak into the hematoma from the anterior wall of the proximal right superficial femoral artery consistent with active bleed. 3. The anterior compartment thigh musculature overlying this hematoma is asymmetrically larger with likely intramuscular hematoma, but this is difficult to measure as it is only partially imaged and isodense. Clinical correlation is recommended to exclude elevated anterior thigh compartment pressures. Again, infectious complication is not excluded. 4. No retroperitoneal hematoma. 5. Constipation and diverticulosis. 6. Aortic and coronary artery atherosclerosis. 7. Small hiatal hernia with probable gastritis. Endoscopy may be indicated given the degree of wall thickening. 8. Stable 5 cm Bosniak 2 thinly septated cyst in the upper pole left kidney. Aortic Atherosclerosis (ICD10-I70.0). Electronically Signed   By: Telford Nab M.D.   On: 01/13/2022 20:43   VAS [place] LOWER EXT ARTERIAL PSEUDOANEURYSM COMPRESSION  Result Date: 01/13/2022  ARTERIAL PSEUDOANEURYSM   Patient Name:  Cassandra Alexander  Date of Exam:   01/13/2022 Medical Rec #: [id number]      Accession #:    [id number] Date of Birth: 05-Oct-1945      Patient Gender: F Patient Age:   52 years Exam Location:  [facility] Procedure:      VAS [place] LOWER EXT ARTERIAL PSEUDOANEURYSM COMPRESSION Referring Phys: Monica Martinez --------------------------------------------------------------------------------  Exam: Right groin Indications: Patient complains of groin pain and bruising. History: S/p catheterization 2.3 x 2.9 cm pseudoaneurysm on ultrasound evaluation. Comparison Study: 01-13-2022 Right lower extremity arterial pseudoaneurysm                   evaluation. Performing Technologist: Darlin Coco RDMS, RVT  Examination Guidelines: A complete evaluation includes B-mode imaging, spectral Doppler, color Doppler, and power Doppler as needed of all accessible portions of each vessel. Bilateral testing is considered an integral part of a complete examination. Limited examinations for reoccurring indications may be performed as noted. +------------+----------+---------+------+----------+ Right DuplexPSV (cm/s)Waveform PlaqueComment(s) +------------+----------+---------+------+----------+ CFA             74    triphasic                 +------------+----------+---------+------+----------+ PFA             63    triphasic                 +------------+----------+---------+------+----------+ Prox SFA       137    triphasic                 +------------+----------+---------+------+----------+  Summary: Pseudoaneurysm was identified arising from the proximal SFA at the bifurcation. Pressure was held for 35 minutes. Residual lumen measured 63m x 270m The SFA, PFA, and distal PTA were all observed to be patent with triphasic waveforms post-compression.    --------------------------------------------------------------------------------  Preliminary    VAS [place] GROIN PSEUDOANEURYSM  Result Date:  01/13/2022  ARTERIAL PSEUDOANEURYSM  Patient Name:  LAURIE PENADO  Date of Exam:   01/13/2022 Medical Rec #: [id number]      Accession #:    [id number] Date of Birth: [birth year]      Patient Gender: F Patient Age:   15 years Exam Location:  [facility] Procedure:      VAS [place] GROIN PSEUDOANEURYSM Referring Phys: Sherlie Ban MATTHEWS --------------------------------------------------------------------------------  Exam: Right groin Indications: Patient complains of groin pain and bruising. History: S/p catheterization. Performing Technologist: Archie Patten RVS  Examination Guidelines: A complete evaluation includes B-mode imaging, spectral Doppler, color Doppler, and power Doppler as needed of all accessible portions of each vessel. Bilateral testing is considered an integral part of a complete examination. Limited examinations for reoccurring indications may be performed as noted. +------------+----------+---------+------+----------+ Right DuplexPSV (cm/s)Waveform PlaqueComment(s) +------------+----------+---------+------+----------+ CFA            175    triphasic                 +------------+----------+---------+------+----------+ Prox SFA        87    triphasic                 +------------+----------+---------+------+----------+  Findings: An area with well defined borders measuring 2.3 cm x 2.9 cm was visualized arising off of the Distal CFA with ultrasound characteristics of a pseudoaneurysm. The neck measures approximately 0.1 cm wide and 0.1 cm long. Pseduo neck appears very short measuring <62m.  Diagnosing physician: CMonica MartinezMD Electronically signed by CMonica MartinezMD on 01/13/2022 at 4:26:08 PM.    --------------------------------------------------------------------------------    Final     Pending Labs Unresulted Labs (From admission, onward)     Start     Ordered   01/14/22 0500  CBC  Tomorrow morning,   R        01/13/22 2131   01/14/22 0500  Comprehensive  metabolic panel  Tomorrow morning,   R        01/13/22 2131            Vitals/Pain Today's Vitals   01/14/22 0107 01/14/22 0129 01/14/22 0130 01/14/22 0130  BP:   (!) 145/80   Pulse:  88 88   Resp:  18 15   Temp: 97.8 F (36.6 C)     TempSrc: Oral     SpO2:  92% 96%   Weight:      Height:      PainSc:    Asleep    Isolation Precautions No active isolations  Medications Medications  0.9 %  sodium chloride infusion (0 mLs Intravenous Stopped 01/13/22 2132)  citalopram (CELEXA) tablet 10 mg (10 mg Oral Given 01/13/22 2237)  mesalamine (LIALDA) EC tablet 2.4 g (has no administration in time range)  imipramine (TOFRANIL) tablet 100 mg (100 mg Oral Given 01/13/22 2238)  atorvastatin (LIPITOR) tablet 80 mg (80 mg Oral Given 01/13/22 2237)  nicotine polacrilex (NICORETTE) gum 2 mg (has no administration in time range)  0.9 %  sodium chloride infusion ( Intravenous Transfusing/Transfer 01/13/22 2134)  ondansetron (ZOFRAN) tablet 4 mg (has no administration in time range)    Or  ondansetron (ZOFRAN) injection 4 mg (has no administration in time range)  hydrALAZINE (APRESOLINE) injection 10 mg (has no administration in time range)  lisinopril (ZESTRIL) tablet 10 mg (has no administration in time range)  0.9 %  sodium chloride infusion (0 mL/hr Intravenous  Stopped 01/13/22 2050)  fentaNYL (SUBLIMAZE) injection 50 mcg (50 mcg Intravenous Given 01/13/22 1658)  iohexol (OMNIPAQUE) 350 MG/ML injection 75 mL (75 mLs Intravenous Contrast Given 01/13/22 2005)    Mobility walks Low fall risk   Focused Assessments Neuro Assessment Handoff:  Swallow screen pass? Yes          Neuro Assessment: Exceptions to WDL Neuro Checks:      Last Documented NIHSS Modified Score:   Has TPA been given? No If patient is a Neuro Trauma and patient is going to OR before floor call report to [place] nurse: [phone] or [phone]   R Recommendations: See Admitting Provider Note  Report  given to:   Additional Notes:

## 2022-01-14 NOTE — Progress Notes (Addendum)
  Progress Note    01/14/2022 6:56 AM Hospital Day 1  Subjective:  no complaints  afebrile  Vitals:   01/14/22 0130 01/14/22 0439  BP: (!) 145/80 124/64  Pulse: 88 84  Resp: 15 17  Temp:  97.6 F (36.4 C)  SpO2: 96% 95%    Physical Exam: General:  no distress Lungs:  non labored Extremities:  right groin with ecchymosis; palpable right DP pulse.  Right foot warm and well perfused.    CBC    Component Value Date/Time   WBC 8.6 01/14/2022 0315   RBC 2.88 (L) 01/14/2022 0315   HGB 8.5 (L) 01/14/2022 0315   HCT 26.6 (L) 01/14/2022 0315   PLT 188 01/14/2022 0315   MCV 92.4 01/14/2022 0315   MCH 29.5 01/14/2022 0315   MCHC 32.0 01/14/2022 0315   RDW 18.1 (H) 01/14/2022 0315   LYMPHSABS 1.4 01/13/2022 1417   MONOABS 0.7 01/13/2022 1417   EOSABS 0.0 01/13/2022 1417   BASOSABS 0.0 01/13/2022 1417    BMET    Component Value Date/Time   NA 139 01/14/2022 0315   K 3.5 01/14/2022 0315   CL 106 01/14/2022 0315   CO2 25 01/14/2022 0315   GLUCOSE 111 (H) 01/14/2022 0315   GLUCOSE 94 01/26/2006 1026   BUN 11 01/14/2022 0315   CREATININE 0.54 01/14/2022 0315   CALCIUM 8.3 (L) 01/14/2022 0315   GFRNONAA >60 01/14/2022 0315   GFRAA >60 05/17/2018 0516    INR    Component Value Date/Time   INR 1.0 01/06/2022 0818     Intake/Output Summary (Last 24 hours) at 01/14/2022 0656 Last data filed at 01/14/2022 0455 Gross per 24 hour  Intake 1054.08 ml  Output --  Net 1054.08 ml     Assessment/Plan:  76 y.o. 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 Estanislado Pandy.  She developed right CFA psa and underwent compression in ER yesterday with 98% thrombosis of psa.    Hospital Day 1  -pt for repeat duplex today to evaluate psa to see if it is fully thrombosed.    Leontine Locket, PA-C [facility] [phone] 01/14/2022 6:56 AM   I have seen and evaluated the patient. I agree with the PA note as documented above. Successful US guided compression of residual right femoral pseudoaneurysm this am at bedside.  Right groin stable on exam.  Palpable DP pulse right foot.  Marty Heck, MD [facility] [place] Office: [phone]

## 2022-01-14 NOTE — Progress Notes (Signed)
Right lower extremity pseudoaneurysm evaluation w/ compression study completed.  Preliminary results relayed to Carlis Abbott, MD.  See CV Proc for preliminary results report.   Darlin Coco, RDMS, RVT

## 2022-01-14 NOTE — Progress Notes (Signed)
PROGRESS NOTE    Cassandra Alexander  VEL:[id number] DOB: 18-Mar-1945 DOA: 01/13/2022 PCP: Alroy Dust, L.Marlou Sa, MD     Brief Narrative:  Cassandra Alexander is a 76 y.o. female with medical history significant of recent CVA with cerebral aneurysm s/p thrombectomy apparently via right groin and since then she has had pain and bruising on the right groin area.  She went for her regular follow-up was seen by neurology.  Patient was found to be anemic.  Her hemoglobin has apparently dropped to 8.1 from 12.2 last week.  She also has a large area of her right groin that appears swollen.  Patient was found to have pseudoaneurysm of the right groin.  Dr. Carlis Abbott vascular surgery was consulted.  He was able to do ultrasound with compression of the pseudoaneurysm.  Patient has improved.  She was transfused 1 unit of packed red blood cells in the ER.  New events last 24 hours / Subjective: Wants to go home. She admits to continued swelling and mild pain of right groin/lower extremity.   Assessment & Plan:   Principal Problem:   Femoral artery pseudo-aneurysm, right (HCC) Active Problems:   S/P cerebral aneurysm operation   Benign essential hypertension   Anxiety and depression   Gastroesophageal reflux disease   Right femoral pseudoaneurysm -Vascular surgery following -Duplex completed today   Acute blood loss anemia -Baseline hemoglobin 12.2 --> 8.1 -Status post 1 unit packed red blood cell transfusion 11/1  Hypertension -Lisinopril  Recent CVA -Status post mechanical thrombectomy via right transfemoral access -Aspirin and Plavix on hold due to pseudoaneurysm -Lipitor   Anxiety/depression -Citalopram, imipramine    DVT prophylaxis:  SCDs Start: 01/13/22 2132  Code Status: Full Family Communication: None at bedside  Disposition Plan:  Status is: Inpatient Remains inpatient appropriate because: Awaiting further vascular surgery input    Antimicrobials:  Anti-infectives (From admission,  onward)    None        Objective: Vitals:   01/14/22 0439 01/14/22 0755 01/14/22 0800 01/14/22 1119  BP: 124/64 133/89  (!) 142/66  Pulse: 84 77  79  Resp: 17 15  18   Temp: 97.6 F (36.4 C)  97.6 F (36.4 C) 98.3 F (36.8 C)  TempSrc: Oral  Oral Oral  SpO2: 95%   96%  Weight: 50.1 kg     Height: 5' (1.524 m)       Intake/Output Summary (Last 24 hours) at 01/14/2022 1354 Last data filed at 01/14/2022 1100 Gross per 24 hour  Intake 1512.1 ml  Output --  Net 1512.1 ml   Filed Weights   01/13/22 1324 01/14/22 0439  Weight: 49 kg 50.1 kg    Examination:  General exam: Appears calm and comfortable  Respiratory system: Clear to auscultation. Respiratory effort normal. No respiratory distress. No conversational dyspnea.  Cardiovascular system: S1 & S2 heard, RRR. No murmurs. No pedal edema. Gastrointestinal system: Abdomen is nondistended, soft and nontender. Normal bowel sounds heard. Central nervous system: Alert and oriented. No focal neurological deficits. Speech clear.  Extremities: RLE with swelling and bruising  Psychiatry: Judgement and insight appear normal. Mood & affect appropriate.   Data Reviewed: I have personally reviewed following labs and imaging studies  CBC: Recent Labs  Lab 01/13/22 1417 01/14/22 0315  WBC 7.6 8.6  NEUTROABS 5.4  --   HGB 8.1* 8.5*  HCT 25.7* 26.6*  MCV 97.7 92.4  PLT 235 258   Basic Metabolic Panel: Recent Labs  Lab 01/08/22 0522 01/13/22 1417 01/14/22 0315  NA 140 143 139  K 3.3* 3.5 3.5  CL 109 102 106  CO2 24 29 25   GLUCOSE 127* 142* 111*  BUN 6* 11 11  CREATININE 0.56 0.60 0.54  CALCIUM 8.5* 9.3 8.3*   GFR: Estimated Creatinine Clearance: 44.3 mL/min (by C-G formula based on SCr of 0.54 mg/dL). Liver Function Tests: Recent Labs  Lab 01/13/22 1417 01/14/22 0315  AST 20 15  ALT 14 11  ALKPHOS 52 47  BILITOT 0.8 1.3*  PROT 6.2* 5.4*  ALBUMIN 3.4* 3.0*   No results for input(s): "LIPASE", "AMYLASE" in  the last 168 hours. No results for input(s): "AMMONIA" in the last 168 hours. Coagulation Profile: No results for input(s): "INR", "PROTIME" in the last 168 hours. Cardiac Enzymes: No results for input(s): "CKTOTAL", "CKMB", "CKMBINDEX", "TROPONINI" in the last 168 hours. BNP (last 3 results) No results for input(s): "PROBNP" in the last 8760 hours. HbA1C: No results for input(s): "HGBA1C" in the last 72 hours. CBG: Recent Labs  Lab 01/07/22 2344 01/08/22 0010 01/08/22 0331 01/08/22 0839 01/08/22 1228  GLUCAP 66* 123* 137* 124* 117*   Lipid Profile: No results for input(s): "CHOL", "HDL", "LDLCALC", "TRIG", "CHOLHDL", "LDLDIRECT" in the last 72 hours. Thyroid Function Tests: No results for input(s): "TSH", "T4TOTAL", "FREET4", "T3FREE", "THYROIDAB" in the last 72 hours. Anemia Panel: No results for input(s): "VITAMINB12", "FOLATE", "FERRITIN", "TIBC", "IRON", "RETICCTPCT" in the last 72 hours. Sepsis Labs: No results for input(s): "PROCALCITON", "LATICACIDVEN" in the last 168 hours.  Recent Results (from the past 240 hour(s))  SARS Coronavirus 2 by RT PCR (hospital order, performed in [facility] hospital lab) *cepheid single result test* Anterior Nasal Swab     Status: None   Collection Time: 01/06/22  9:19 AM   Specimen: Anterior Nasal Swab  Result Value Ref Range Status   SARS Coronavirus 2 by RT PCR NEGATIVE NEGATIVE Final    Comment: (NOTE) SARS-CoV-2 target nucleic acids are NOT DETECTED.  The SARS-CoV-2 RNA is generally detectable in upper and lower respiratory specimens during the acute phase of infection. The lowest concentration of SARS-CoV-2 viral copies this assay can detect is 250 copies / mL. A negative result does not preclude SARS-CoV-2 infection and should not be used as the sole basis for treatment or other patient management decisions.  A negative result may occur with improper specimen collection / handling, submission of specimen other than  nasopharyngeal swab, presence of viral mutation(s) within the areas targeted by this assay, and inadequate number of viral copies (<250 copies / mL). A negative result must be combined with clinical observations, patient history, and epidemiological information.  Fact Sheet for Patients:   https://www.patel.info/  Fact Sheet for Healthcare Providers: https://hall.com/  This test is not yet approved or  cleared by the [place] FDA and has been authorized for detection and/or diagnosis of SARS-CoV-2 by FDA under an Emergency Use Authorization (EUA).  This EUA will remain in effect (meaning this test can be used) for the duration of the COVID-19 declaration under Section 564(b)(1) of the Act, 21 U.S.C. section 360bbb-3(b)(1), unless the authorization is terminated or revoked sooner.  Performed at [facility], [place] [address] [place], [place] [address]       Radiology Studies: VAS [place] GROIN PSEUDOANEURYSM  Result Date: 01/14/2022  ARTERIAL PSEUDOANEURYSM  Patient Name:  Cassandra Alexander  Date of Exam:   01/14/2022 Medical Rec #: [id number]      Accession #:    [id number] Date of Birth:  December 14, 1945      Patient Gender: F Patient Age:   31 years Exam Location:  [facility] Procedure:      VAS [place] GROIN PSEUDOANEURYSM Referring Phys: Monica Martinez --------------------------------------------------------------------------------  Exam: Right groin Indications: Patient complains of groin pain, bruising and active bleed visualized on CT abdomen 01-13-2022 post-compression. Comparison Study: 01-13-2022 Lower extremity pseudoaneurysm compression study                   showed a subcentimeter residual lumen. Performing Technologist: Darlin Coco RDMS, RVT  Examination Guidelines: A complete evaluation includes B-mode imaging, spectral Doppler, color Doppler, and power Doppler as needed of all accessible portions of each vessel. Bilateral testing  is considered an integral part of a complete examination. Limited examinations for reoccurring indications may be performed as noted. +------------+----------+---------+------+----------+ Right DuplexPSV (cm/s)Waveform PlaqueComment(s) +------------+----------+---------+------+----------+ Prox SFA       202    triphasic                 +------------+----------+---------+------+----------+  Summary: A partially thrombosed pseudoaneurysm with residual lumen of 0.74 x 0.55 cm was visualized arising from the proximal SFA. Compression was performed for 15 minutes. Post-compression, no flow was visualized within the previously observed residual lumen. The SFA, DFA, and distal PTA were all observed to be patent. Post-compression hematoma measured 5.6 x 2.8 x 2.3 cm.   --------------------------------------------------------------------------------    Preliminary    CT Abdomen Pelvis W Contrast  Result Date: 01/13/2022 CLINICAL DATA:  Right groin bruising and pain following mechanical thrombectomy last week via right femoral line, procedure dated 01/06/2022. Concern for anemia and retroperitoneal bleed. Abdominal pain. EXAM: CT ABDOMEN AND PELVIS WITH CONTRAST TECHNIQUE: Multidetector CT imaging of the abdomen and pelvis was performed using the standard protocol following bolus administration of intravenous contrast. RADIATION DOSE REDUCTION: This exam was performed according to the departmental dose-optimization program which includes automated exposure control, adjustment of the mA and/or kV according to patient size and/or use of iterative reconstruction technique. CONTRAST:  3m OMNIPAQUE IOHEXOL 350 MG/ML SOLN COMPARISON:  PET-CT 06/08/2021, CT abdomen pelvis with IV contrast 03/17/2021, 09/06/2019. FINDINGS: Lower chest: Lung bases again demonstrate subsegmental linear atelectasis and a calcified left lower lobe posterior basal granuloma. There is mild cardiomegaly. There are coronary artery  calcifications. No pericardial effusion. Small hiatal hernia. Hepatobiliary: There is mild hepatic steatosis without mass enhancement. Gallbladder and bile ducts are unremarkable. Pancreas: No focal abnormality, ductal dilatation or inflammatory changes. Spleen: No focal abnormality or splenomegaly. Adrenals/Urinary Tract: There is no adrenal or renal cortical mass enhancement. Again, a thinly septated and otherwise homogeneous 5 cm cyst in the medial upper pole left kidney is noted measuring 19 Hounsfield units both initially in the delayed phase. This is unchanged in size dating back to 09/06/2019. No urinary stone or obstruction seen. No bladder thickening. Stomach/Bowel: Small hiatal hernia. Moderate thickened folds in the stomach including the antro pyloric area. The unopacified small bowel is unremarkable. Large volume of stool in the cecum, ascending and transverse colon again is noted. There are colonic diverticula without evidence of wall thickening or inflammatory change. An appendix is not seen in this patient. Patent sigmoid surgical anastomosis is again seen in the left lower abdominopelvic quadrant. Vascular/Lymphatic: On series 3 axial image 74 and coronal reconstruction image 31, there is a right groin hematoma overlying and compressing and significantly narrowing the proximal right superficial femoral artery and measuring 3.8 x 3.1 x 6.7 cm. There is a linear contrast leak into  the hematoma from the anterior wall of the proximal right superficial femoral artery on 3:70 and 71 consistent with active bleed. The anterior compartment thigh musculature overlying this is asymmetrically larger with likely intramuscular hematoma, but this is difficult to measure as it is only partially imaged and is isodense. Clinical correlation is recommended to exclude elevated anterior thigh compartment pressures. Below this level the proximal superficial femoral artery becomes normal in caliber. There is only mild  narrowing in the proximal deep right femoral artery, related to mass effect from the hematoma. Below the hematoma this artery is normal caliber also. No adenopathy is seen. No retroperitoneal hematoma. There is moderate aortoiliac calcific plaque without AAA. There are numerous pelvic phleboliths. Reproductive: Status post hysterectomy. No adnexal masses. Other: There is no free fluid, free hemorrhage or free air. There is no incarcerated hernia. Musculoskeletal: There is old L4-5 posterior fusion hardware with interbody metallic disc space apparatus, with lumbar S shaped scoliosis and advanced degenerative changes with osteopenia. No new bone abnormality. IMPRESSION: 1. 3.8 x 3.1 x 6.7 cm right groin hematoma overlying and compressing and significantly narrowing the proximal right superficial femoral artery. Infectious complication not excluded. 2. There is a linear contrast leak into the hematoma from the anterior wall of the proximal right superficial femoral artery consistent with active bleed. 3. The anterior compartment thigh musculature overlying this hematoma is asymmetrically larger with likely intramuscular hematoma, but this is difficult to measure as it is only partially imaged and isodense. Clinical correlation is recommended to exclude elevated anterior thigh compartment pressures. Again, infectious complication is not excluded. 4. No retroperitoneal hematoma. 5. Constipation and diverticulosis. 6. Aortic and coronary artery atherosclerosis. 7. Small hiatal hernia with probable gastritis. Endoscopy may be indicated given the degree of wall thickening. 8. Stable 5 cm Bosniak 2 thinly septated cyst in the upper pole left kidney. Aortic Atherosclerosis (ICD10-I70.0). Electronically Signed   By: Telford Nab M.D.   On: 01/13/2022 20:43   VAS [place] LOWER EXT ARTERIAL PSEUDOANEURYSM COMPRESSION  Result Date: 01/13/2022  ARTERIAL PSEUDOANEURYSM  Patient Name:  Cassandra Alexander  Date of Exam:   01/13/2022  Medical Rec #: [id number]      Accession #:    [id number] Date of Birth: 01/07/1946      Patient Gender: F Patient Age:   36 years Exam Location:  [facility] Procedure:      VAS [place] LOWER EXT ARTERIAL PSEUDOANEURYSM COMPRESSION Referring Phys: Monica Martinez --------------------------------------------------------------------------------  Exam: Right groin Indications: Patient complains of groin pain and bruising. History: S/p catheterization 2.3 x 2.9 cm pseudoaneurysm on ultrasound evaluation. Comparison Study: 01-13-2022 Right lower extremity arterial pseudoaneurysm                   evaluation. Performing Technologist: Darlin Coco RDMS, RVT  Examination Guidelines: A complete evaluation includes B-mode imaging, spectral Doppler, color Doppler, and power Doppler as needed of all accessible portions of each vessel. Bilateral testing is considered an integral part of a complete examination. Limited examinations for reoccurring indications may be performed as noted. +------------+----------+---------+------+----------+ Right DuplexPSV (cm/s)Waveform PlaqueComment(s) +------------+----------+---------+------+----------+ CFA             74    triphasic                 +------------+----------+---------+------+----------+ PFA             63    triphasic                 +------------+----------+---------+------+----------+  Prox SFA       137    triphasic                 +------------+----------+---------+------+----------+  Summary: Pseudoaneurysm was identified arising from the proximal SFA at the bifurcation. Pressure was held for 35 minutes. Residual lumen measured 49m x 297m The SFA, PFA, and distal PTA were all observed to be patent with triphasic waveforms post-compression.    --------------------------------------------------------------------------------    Preliminary    VAS US[place]ROIN PSEUDOANEURYSM  Result Date: 01/13/2022  ARTERIAL PSEUDOANEURYSM  Patient Name:  Cassandra CARROLDate of Exam:   01/13/2022 Medical Rec #: [id number]    Accession #:    [id number] of Birth: 1208-24-1947    Patient Gender: F Patient Age:   [age]ears [facility]      [facility] US[place]ROIN PSEUDOANEURYSM Referring Phys: KASherlie BanATTHEWS --------------------------------------------------------------------------------  Exam: Right groin Indications: Patient complains of groin pain and bruising. History: S/p catheterization. Performing Technologist: MeArchie PattenVS  Examination Guidelines: A complete evaluation includes B-mode imaging, spectral Doppler, color Doppler, and power Doppler as needed of all accessible portions of each vessel. Bilateral testing is considered an integral part of a complete examination. Limited examinations for reoccurring indications may be performed as noted. +------------+----------+---------+------+----------+ Right DuplexPSV (cm/s)Waveform PlaqueComment(s) +------------+----------+---------+------+----------+ CFA            175    triphasic                 +------------+----------+---------+------+----------+ Prox SFA        87    triphasic                 +------------+----------+---------+------+----------+  Findings: An area with well defined borders measuring 2.3 cm x 2.9 cm was visualized arising off of the Distal CFA with ultrasound characteristics of a pseudoaneurysm. The neck measures approximately 0.1 cm wide and 0.1 cm long. Pseduo neck appears very short measuring <42m1m Diagnosing physician: ChrMonica Martinez Electronically signed by ChrMonica Martinez on 01/13/2022 at 4:26:08 PM.    --------------------------------------------------------------------------------    Final       Scheduled Meds:  atorvastatin  80 mg Oral Daily   citalopram  10 mg Oral QHS   imipramine  100 mg Oral QHS   lisinopril  10 mg Oral Daily   mesalamine  2.4 g Oral Q breakfast   Continuous Infusions:  sodium chloride Stopped  (01/13/22 2132)   sodium chloride 100 mL/hr at 01/14/22 0625     LOS: 0 days     JenDessa PhiO [facility] 01/14/2022, 1:54 PM   Available via Epic secure chat 7am-7pm After these hours, please refer to coverage provider listed on [email or web address]

## 2022-01-14 NOTE — Plan of Care (Signed)
  Problem: Activity: Goal: Ability to return to baseline activity level will improve Outcome: Progressing   

## 2022-01-14 NOTE — ED Notes (Signed)
Pt placed on 2L [place] due to desatting while sleeping

## 2022-01-15 ENCOUNTER — Encounter (HOSPITAL_COMMUNITY)
Admission: EM | Disposition: A | Discharge status: Home / Self Care | Payer: Self-pay | Source: Home / Self Care | Attending: Internal Medicine

## 2022-01-15 ENCOUNTER — Encounter (HOSPITAL_COMMUNITY): Payer: Self-pay | Admitting: Internal Medicine

## 2022-01-15 ENCOUNTER — Inpatient Hospital Stay (HOSPITAL_COMMUNITY): Payer: PPO

## 2022-01-15 ENCOUNTER — Inpatient Hospital Stay (HOSPITAL_COMMUNITY): Payer: PPO | Admitting: Anesthesiology

## 2022-01-15 ENCOUNTER — Other Ambulatory Visit: Payer: Self-pay

## 2022-01-15 DIAGNOSIS — I724 Aneurysm of artery of lower extremity: Secondary | ICD-10-CM

## 2022-01-15 DIAGNOSIS — I119 Hypertensive heart disease without heart failure: Secondary | ICD-10-CM

## 2022-01-15 DIAGNOSIS — C434 Malignant melanoma of scalp and neck: Secondary | ICD-10-CM | POA: Diagnosis not present

## 2022-01-15 DIAGNOSIS — F418 Other specified anxiety disorders: Secondary | ICD-10-CM

## 2022-01-15 DIAGNOSIS — L7632 Postprocedural hematoma of skin and subcutaneous tissue following other procedure: Secondary | ICD-10-CM

## 2022-01-15 DIAGNOSIS — Z87891 Personal history of nicotine dependence: Secondary | ICD-10-CM

## 2022-01-15 HISTORY — PX: HEMATOMA EVACUATION: SHX5118

## 2022-01-15 HISTORY — PX: FEMORAL ARTERY EXPLORATION: SHX5160

## 2022-01-15 HISTORY — PX: ARTERY REPAIR: SHX5117

## 2022-01-15 HISTORY — PX: PLACEMENT OF LUMBAR DRAIN: SHX6028

## 2022-01-15 LAB — SURGICAL PCR SCREEN
MRSA, PCR: NEGATIVE
Staphylococcus aureus: NEGATIVE

## 2022-01-15 LAB — POCT I-STAT 7, (LYTES, BLD GAS, ICA,H+H)
Acid-base deficit: 1 mmol/L (ref 0.0–2.0)
Bicarbonate: 24.1 mmol/L (ref 20.0–28.0)
Calcium, Ion: 1.09 mmol/L — ABNORMAL LOW (ref 1.15–1.40)
HCT: 26 % — ABNORMAL LOW (ref 36.0–46.0)
Hemoglobin: 8.8 g/dL — ABNORMAL LOW (ref 12.0–15.0)
O2 Saturation: 100 %
Patient temperature: 36.5
Potassium: 3.9 mmol/L (ref 3.5–5.1)
Sodium: 135 mmol/L (ref 135–145)
TCO2: 25 mmol/L (ref 22–32)
pCO2 arterial: 42.7 mmHg (ref 32–48)
pH, Arterial: 7.358 (ref 7.35–7.45)
pO2, Arterial: 295 mmHg — ABNORMAL HIGH (ref 83–108)

## 2022-01-15 LAB — CBC
HCT: 25.4 % — ABNORMAL LOW (ref 36.0–46.0)
Hemoglobin: 8.2 g/dL — ABNORMAL LOW (ref 12.0–15.0)
MCH: 29.8 pg (ref 26.0–34.0)
MCHC: 32.3 g/dL (ref 30.0–36.0)
MCV: 92.4 fL (ref 80.0–100.0)
Platelets: 189 10*3/uL (ref 150–400)
RBC: 2.75 MIL/uL — ABNORMAL LOW (ref 3.87–5.11)
RDW: 18.8 % — ABNORMAL HIGH (ref 11.5–15.5)
WBC: 7.8 10*3/uL (ref 4.0–10.5)
nRBC: 0 % (ref 0.0–0.2)

## 2022-01-15 LAB — PREPARE RBC (CROSSMATCH)

## 2022-01-15 SURGERY — EXPLORATION, ARTERY, FEMORAL
Anesthesia: General | Site: Leg Upper | Laterality: Right

## 2022-01-15 MED ORDER — ONDANSETRON HCL 4 MG/2ML IJ SOLN: 4.0000 mg | Freq: Once | INTRAMUSCULAR | Status: DC | PRN

## 2022-01-15 MED ORDER — CEFAZOLIN SODIUM-DEXTROSE 2-4 GM/100ML-% IV SOLN
2.0000 g | Freq: Once | INTRAVENOUS | Status: AC
  Administered 2022-01-15: 2 g via INTRAVENOUS

## 2022-01-15 MED ORDER — ROCURONIUM BROMIDE 10 MG/ML (PF) SYRINGE
PREFILLED_SYRINGE | INTRAVENOUS | Status: DC | PRN
  Administered 2022-01-15: 80 mg via INTRAVENOUS

## 2022-01-15 MED ORDER — CEFAZOLIN SODIUM-DEXTROSE 2-4 GM/100ML-% IV SOLN
INTRAVENOUS | Status: AC
  Filled 2022-01-15: qty 100

## 2022-01-15 MED ORDER — PROPOFOL 10 MG/ML IV BOLUS
INTRAVENOUS | Status: AC
  Filled 2022-01-15: qty 20

## 2022-01-15 MED ORDER — VANCOMYCIN HCL IN DEXTROSE 1-5 GM/200ML-% IV SOLN
1000.0000 mg | Freq: Once | INTRAVENOUS | Status: AC
  Administered 2022-01-15: 1000 mg via INTRAVENOUS
  Filled 2022-01-15: qty 200

## 2022-01-15 MED ORDER — LABETALOL HCL 5 MG/ML IV SOLN: 10.0000 mg | INTRAVENOUS | Status: DC | PRN

## 2022-01-15 MED ORDER — LIDOCAINE 2% (20 MG/ML) 5 ML SYRINGE
INTRAMUSCULAR | Status: AC
  Filled 2022-01-15: qty 5

## 2022-01-15 MED ORDER — PANTOPRAZOLE SODIUM 40 MG PO TBEC
40.0000 mg | DELAYED_RELEASE_TABLET | Freq: Every day | ORAL | Status: DC
  Administered 2022-01-15 – 2022-01-18 (×4): 40 mg via ORAL
  Filled 2022-01-15 (×4): qty 1

## 2022-01-15 MED ORDER — HEPARIN 6000 UNIT IRRIGATION SOLUTION
Status: AC
  Filled 2022-01-15: qty 500

## 2022-01-15 MED ORDER — FENTANYL CITRATE (PF) 250 MCG/5ML IJ SOLN
INTRAMUSCULAR | Status: AC
  Filled 2022-01-15: qty 5

## 2022-01-15 MED ORDER — FENTANYL CITRATE (PF) 250 MCG/5ML IJ SOLN
INTRAMUSCULAR | Status: DC | PRN
  Administered 2022-01-15 (×2): 50 ug via INTRAVENOUS

## 2022-01-15 MED ORDER — DEXAMETHASONE SODIUM PHOSPHATE 10 MG/ML IJ SOLN
INTRAMUSCULAR | Status: AC
  Filled 2022-01-15: qty 1

## 2022-01-15 MED ORDER — SODIUM CHLORIDE 0.9 % IV SOLN: INTRAVENOUS | Status: DC

## 2022-01-15 MED ORDER — ONDANSETRON HCL 4 MG/2ML IJ SOLN
INTRAMUSCULAR | Status: AC
  Filled 2022-01-15: qty 2

## 2022-01-15 MED ORDER — FENTANYL CITRATE (PF) 100 MCG/2ML IJ SOLN
25.0000 ug | INTRAMUSCULAR | Status: DC | PRN
  Administered 2022-01-15: 25 ug via INTRAVENOUS

## 2022-01-15 MED ORDER — POTASSIUM CHLORIDE CRYS ER 20 MEQ PO TBCR: 20.0000 meq | EXTENDED_RELEASE_TABLET | Freq: Every day | ORAL | Status: DC | PRN

## 2022-01-15 MED ORDER — PHENOL 1.4 % MT LIQD
1.0000 | OROMUCOSAL | Status: DC | PRN
  Administered 2022-01-16: 1 via OROMUCOSAL
  Filled 2022-01-15: qty 177

## 2022-01-15 MED ORDER — LACTATED RINGERS IV SOLN: INTRAVENOUS | Status: DC

## 2022-01-15 MED ORDER — ALUM & MAG HYDROXIDE-SIMETH 200-200-20 MG/5ML PO SUSP: 15.0000 mL | ORAL | Status: DC | PRN

## 2022-01-15 MED ORDER — MEPERIDINE HCL 25 MG/ML IJ SOLN: 6.2500 mg | INTRAMUSCULAR | Status: DC | PRN

## 2022-01-15 MED ORDER — OXYCODONE HCL 5 MG/5ML PO SOLN: 5.0000 mg | Freq: Once | ORAL | Status: DC | PRN

## 2022-01-15 MED ORDER — HEPARIN 6000 UNIT IRRIGATION SOLUTION
Status: DC | PRN
  Administered 2022-01-15: 1

## 2022-01-15 MED ORDER — PROPOFOL 10 MG/ML IV BOLUS
INTRAVENOUS | Status: DC | PRN
  Administered 2022-01-15: 70 mg via INTRAVENOUS

## 2022-01-15 MED ORDER — LACTATED RINGERS IV SOLN: INTRAVENOUS | Status: DC | PRN

## 2022-01-15 MED ORDER — ONDANSETRON HCL 4 MG/2ML IJ SOLN
INTRAMUSCULAR | Status: DC | PRN
  Administered 2022-01-15: 4 mg via INTRAVENOUS

## 2022-01-15 MED ORDER — 0.9 % SODIUM CHLORIDE (POUR BTL) OPTIME
TOPICAL | Status: DC | PRN
  Administered 2022-01-15: 2000 mL

## 2022-01-15 MED ORDER — HEPARIN SODIUM (PORCINE) 5000 UNIT/ML IJ SOLN
5000.0000 [IU] | Freq: Three times a day (TID) | INTRAMUSCULAR | Status: DC
  Administered 2022-01-15 – 2022-01-18 (×8): 5000 [IU] via SUBCUTANEOUS
  Filled 2022-01-15 (×8): qty 1

## 2022-01-15 MED ORDER — FENTANYL CITRATE (PF) 100 MCG/2ML IJ SOLN
INTRAMUSCULAR | Status: AC
  Administered 2022-01-15: 25 ug via INTRAVENOUS
  Filled 2022-01-15: qty 2

## 2022-01-15 MED ORDER — LIDOCAINE 2% (20 MG/ML) 5 ML SYRINGE
INTRAMUSCULAR | Status: DC | PRN
  Administered 2022-01-15: 40 mg via INTRAVENOUS

## 2022-01-15 MED ORDER — SUGAMMADEX SODIUM 200 MG/2ML IV SOLN
INTRAVENOUS | Status: DC | PRN
  Administered 2022-01-15: 200 mg via INTRAVENOUS

## 2022-01-15 MED ORDER — SODIUM CHLORIDE 0.9 % IV SOLN: 10.0000 mL/h | Freq: Once | INTRAVENOUS | Status: DC

## 2022-01-15 MED ORDER — CHLORHEXIDINE GLUCONATE 0.12 % MT SOLN
15.0000 mL | Freq: Once | OROMUCOSAL | Status: AC
  Administered 2022-01-15: 15 mL via OROMUCOSAL

## 2022-01-15 MED ORDER — DEXAMETHASONE SODIUM PHOSPHATE 10 MG/ML IJ SOLN
INTRAMUSCULAR | Status: DC | PRN
  Administered 2022-01-15: 10 mg via INTRAVENOUS

## 2022-01-15 MED ORDER — ORAL CARE MOUTH RINSE: 15.0000 mL | Freq: Once | OROMUCOSAL | Status: AC

## 2022-01-15 MED ORDER — ACETAMINOPHEN 325 MG PO TABS: 325.0000 mg | ORAL_TABLET | ORAL | Status: DC | PRN

## 2022-01-15 MED ORDER — ACETAMINOPHEN 160 MG/5ML PO SOLN: 325.0000 mg | ORAL | Status: DC | PRN

## 2022-01-15 MED ORDER — DOCUSATE SODIUM 100 MG PO CAPS
100.0000 mg | ORAL_CAPSULE | Freq: Every day | ORAL | Status: DC
  Administered 2022-01-16 – 2022-01-18 (×3): 100 mg via ORAL
  Filled 2022-01-15 (×3): qty 1

## 2022-01-15 MED ORDER — SODIUM CHLORIDE 0.9 % IV SOLN: 500.0000 mL | Freq: Once | INTRAVENOUS | Status: DC | PRN

## 2022-01-15 MED ORDER — MAGNESIUM SULFATE 2 GM/50ML IV SOLN: 2.0000 g | Freq: Every day | INTRAVENOUS | Status: DC | PRN

## 2022-01-15 MED ORDER — OXYCODONE HCL 5 MG PO TABS: 5.0000 mg | ORAL_TABLET | Freq: Once | ORAL | Status: DC | PRN

## 2022-01-15 MED ORDER — VANCOMYCIN HCL IN DEXTROSE 1-5 GM/200ML-% IV SOLN: 1000.0000 mg | Freq: Two times a day (BID) | INTRAVENOUS | Status: DC

## 2022-01-15 SURGICAL SUPPLY — 56 items
ADH SKN CLS APL DERMABOND .7 (GAUZE/BANDAGES/DRESSINGS) ×3
BAG COUNTER SPONGE SURGICOUNT (BAG) ×3 IMPLANT
BAG SPNG CNTER NS LX DISP (BAG) ×3
BANDAGE ESMARK 6X9 LF (GAUZE/BANDAGES/DRESSINGS) IMPLANT
BNDG CMPR 9X6 STRL LF SNTH (GAUZE/BANDAGES/DRESSINGS)
BNDG ELASTIC 6X5.8 VLCR STR LF (GAUZE/BANDAGES/DRESSINGS) IMPLANT
BNDG ESMARK 6X9 LF (GAUZE/BANDAGES/DRESSINGS)
CANISTER SUCT 3000ML PPV (MISCELLANEOUS) ×3 IMPLANT
CLIP LIGATING EXTRA MED SLVR (CLIP) ×3 IMPLANT
CLIP LIGATING EXTRA SM BLUE (MISCELLANEOUS) ×6 IMPLANT
COVER PROBE W GEL 5X96 (DRAPES) IMPLANT
CUFF TOURN SGL QUICK 24 (TOURNIQUET CUFF)
CUFF TOURN SGL QUICK 34 (TOURNIQUET CUFF)
CUFF TOURN SGL QUICK 42 (TOURNIQUET CUFF) IMPLANT
CUFF TRNQT CYL 24X4X16.5-23 (TOURNIQUET CUFF) IMPLANT
CUFF TRNQT CYL 34X4.125X (TOURNIQUET CUFF) IMPLANT
DERMABOND ADVANCED .7 DNX12 (GAUZE/BANDAGES/DRESSINGS) IMPLANT
DRAIN CHANNEL 15F RND FF W/TCR (WOUND CARE) IMPLANT
DRAPE C-ARM 42X72 X-RAY (DRAPES) ×3 IMPLANT
ELECT REM PT RETURN 9FT ADLT (ELECTROSURGICAL) ×3
ELECTRODE REM PT RTRN 9FT ADLT (ELECTROSURGICAL) ×3 IMPLANT
EVACUATOR SILICONE 100CC (DRAIN) IMPLANT
GAUZE SPONGE 4X4 12PLY STRL (GAUZE/BANDAGES/DRESSINGS) IMPLANT
GLOVE BIO SURGEON STRL SZ7.5 (GLOVE) ×3 IMPLANT
GLOVE BIOGEL PI IND STRL 8 (GLOVE) ×3 IMPLANT
GLOVE SRG 8 PF TXTR STRL LF DI (GLOVE) ×3 IMPLANT
GLOVE SURG POLYISO LF SZ8 (GLOVE) IMPLANT
GLOVE SURG UNDER POLY LF SZ8 (GLOVE) ×3
GOWN STRL REUS W/ TWL LRG LVL3 (GOWN DISPOSABLE) ×6 IMPLANT
GOWN STRL REUS W/TWL 2XL LVL3 (GOWN DISPOSABLE) ×6 IMPLANT
GOWN STRL REUS W/TWL LRG LVL3 (GOWN DISPOSABLE) ×6
INSERT FOGARTY SM (MISCELLANEOUS) IMPLANT
KIT BASIN OR (CUSTOM PROCEDURE TRAY) ×3 IMPLANT
KIT TURNOVER KIT B (KITS) ×3 IMPLANT
NS IRRIG 1000ML POUR BTL (IV SOLUTION) ×6 IMPLANT
PACK PERIPHERAL VASCULAR (CUSTOM PROCEDURE TRAY) ×3 IMPLANT
PAD ARMBOARD 7.5X6 YLW CONV (MISCELLANEOUS) ×6 IMPLANT
SUT ETHILON 3 0 PS 1 (SUTURE) IMPLANT
SUT MNCRL AB 4-0 PS2 18 (SUTURE) ×9 IMPLANT
SUT PROLENE 5 0 C 1 24 (SUTURE) ×3 IMPLANT
SUT PROLENE 6 0 BV (SUTURE) ×3 IMPLANT
SUT PROLENE 7 0 BV 1 (SUTURE) IMPLANT
SUT SILK 2 0 PERMA HAND 18 BK (SUTURE) ×3 IMPLANT
SUT SILK 2 0 SH CR/8 (SUTURE) IMPLANT
SUT SILK 3 0 (SUTURE)
SUT SILK 3-0 18XBRD TIE 12 (SUTURE) IMPLANT
SUT VIC AB 2-0 CT1 27 (SUTURE) ×6
SUT VIC AB 2-0 CT1 TAPERPNT 27 (SUTURE) ×6 IMPLANT
SUT VIC AB 3-0 SH 27 (SUTURE) ×9
SUT VIC AB 3-0 SH 27X BRD (SUTURE) ×9 IMPLANT
TAPE CLOTH SURG 4X10 WHT LF (GAUZE/BANDAGES/DRESSINGS) IMPLANT
TOWEL GREEN STERILE (TOWEL DISPOSABLE) ×3 IMPLANT
TRAY FOLEY MTR SLVR 16FR STAT (SET/KITS/TRAYS/PACK) ×3 IMPLANT
TUBING EXTENTION W/L.L. (IV SETS) ×3 IMPLANT
UNDERPAD 30X36 HEAVY ABSORB (UNDERPADS AND DIAPERS) ×3 IMPLANT
WATER STERILE IRR 1000ML POUR (IV SOLUTION) ×3 IMPLANT

## 2022-01-15 NOTE — Op Note (Signed)
    NAME: Cassandra Alexander    MRN: [id number] DOB: 1945-08-11    DATE OF OPERATION: 01/15/2022  PREOP DIAGNOSIS:    Right superficial femoral artery pseudoaneurysm  POSTOP DIAGNOSIS:    Same  PROCEDURE:    Right groin exploration Hematoma evacuation Right superficial femoral artery primary repair Drain placement  SURGEON: Broadus John  ASSIST: Leontine Locket, PA  ANESTHESIA: General  EBL: 150 mL  INDICATIONS:    Cassandra Alexander is a 76 y.o. 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 Estanislado Pandy.  She underwent US guided compression of residual right superficial femoral artery pseudoaneurysm yesterday.  Prior to discharge, she noted intense pain in the right leg with new ultrasound demonstrating reformation of the pseudoaneurysm.  After discussing the risk and benefits of open repair after failed medical management, Cassandra Alexander elected to proceed.  FINDINGS:   Significant hematoma appreciated in the thigh. Superficial femoral artery with 8 French arteriotomy.  TECHNIQUE:   Patient was brought to the OR laid in the supine position.  General anesthesia was induced and the patient was prepped and draped in standard fashion.  The case began with ultrasound insonation of the right groin.  A large pseudoaneurysm was appreciated.  An oblique incision was made over the pseudoaneurysm and carried down to the superficial femoral artery.  Bleeding was controlled with manual pressure and a 5-0 Prolene stitch was used to primarily repair the superficial femoral artery.  A Doppler was brought onto the field demonstrating a normal superficial femoral artery signal both proximally and distally.  I also used a duplex ultrasound to ensure there was no narrowing of the vessel.  There was an excellent triphasic signal in the foot.    Next, a significant amount of hematoma was  evacuated from the thigh. Hemostasis of the raw surfaces was achieved with the use of cautery and suture.  The wound was irrigated with copious amounts of saline.  A 15 [place] Blake drain was placed in the wound bed exiting the anterolateral thigh.  The right groin was closed in layers of Vicryl suture with Monocryl and Dermabond at the level of the skin.  The leg was wrapped in a compressive dressing.    At case completion, Cassandra Alexander had a palpable dorsalis pedis pulse.  Macie Burows, MD [facility] DATE OF DICTATION:   01/15/2022

## 2022-01-15 NOTE — Progress Notes (Signed)
Discharge instructions discussed with patient.  Patient instructed on home medications, restrictions, and follow up appointments. Belongings gathered and sent with patient.   Patient discharged via wheelchair by this Probation officer.

## 2022-01-15 NOTE — Discharge Summary (Signed)
Physician Discharge Summary  Cassandra Alexander JQZ:[id number] DOB: February 18, 1946 DOA: 01/13/2022  PCP: Alroy Dust, L.Marlou Sa, MD  Admit date: 01/13/2022 Discharge date: 01/15/2022  Admitted From: Home Disposition:  Home with outpatient PT   Recommendations for Outpatient Follow-up:  Follow up with PCP for repeat Hgb check  Follow up with vascular surgery Dr. Carlis Abbott   Discharge Condition: Stable CODE STATUS: Full  Diet recommendation: Heart healthy   Brief/Interim Summary: Cassandra Alexander is a 76 y.o. female with medical history significant of recent CVA with cerebral aneurysm s/p thrombectomy apparently via right groin and since then she has had pain and bruising on the right groin area.  She went for her regular follow-up was seen by neurology.  Patient was found to be anemic.  Her hemoglobin has apparently dropped to 8.1 from 12.2 last week.  She also has a large area of her right groin that appears swollen.  Patient was found to have pseudoaneurysm of the right groin.  Dr. Carlis Abbott vascular surgery was consulted.  He was able to do ultrasound with compression of the pseudoaneurysm.  Patient has improved.  She was transfused 1 unit of packed red blood cells in the ER.    Discharge Diagnoses:   Principal Problem:   Femoral artery pseudo-aneurysm, right (HCC) Active Problems:   S/P cerebral aneurysm operation   Benign essential hypertension   Anxiety and depression   Gastroesophageal reflux disease   Right femoral pseudoaneurysm -Vascular surgery following -Duplex completed, successful thrombosed pseudoaneurysm    Acute blood loss anemia -Baseline hemoglobin 12.2 --> 8.1 -Status post 1 unit packed red blood cell transfusion 11/1 -Hgb stable today    Hypertension -Lisinopril   Recent CVA -Status post mechanical thrombectomy via right transfemoral access -Aspirin and Plavix on hold due to pseudoaneurysm -Lipitor    Anxiety/depression -Citalopram, imipramine    Discharge  Instructions  Discharge Instructions     Call MD for:  difficulty breathing, headache or visual disturbances   Complete by: As directed    Call MD for:  extreme fatigue   Complete by: As directed    Call MD for:  persistant dizziness or light-headedness   Complete by: As directed    Call MD for:  persistant nausea and vomiting   Complete by: As directed    Call MD for:  severe uncontrolled pain   Complete by: As directed    Call MD for:  temperature >100.4   Complete by: As directed    Diet - low sodium heart healthy   Complete by: As directed    Discharge instructions   Complete by: As directed    You were cared for by a hospitalist during your hospital stay. If you have any questions about your discharge medications or the care you received while you were in the hospital after you are discharged, you can call the unit and ask to speak with the hospitalist on call if the hospitalist that took care of you is not available. Once you are discharged, your primary care physician will handle any further medical issues. Please note that NO REFILLS for any discharge medications will be authorized once you are discharged, as it is imperative that you return to your primary care physician (or establish a relationship with a primary care physician if you do not have one) for your aftercare needs so that they can reassess your need for medications and monitor your lab values.   Increase activity slowly   Complete by: As directed  Allergies as of 01/15/2022       Reactions   Cortisone Swelling   Facial swelling Headache   Penicillins Rash   Childhood reaction Has patient had a PCN reaction causing immediate rash, facial/tongue/throat swelling, SOB or lightheadedness with hypotension: Yes Has patient had a PCN reaction causing severe rash involving mucus membranes or skin necrosis: No Has patient had a PCN reaction that required hospitalization No Has patient had a PCN reaction occurring  within the last 10 years: No If all of the above answers are "NO", then may proceed with Cephalosporin use.        Medication List     TAKE these medications    aspirin 81 MG chewable tablet Chew 1 tablet (81 mg total) by mouth daily.   atorvastatin 80 MG tablet Commonly known as: LIPITOR Take 1 tablet (80 mg total) by mouth daily.   citalopram 10 MG tablet Commonly known as: CELEXA Take 10 mg by mouth at bedtime.   clopidogrel 75 MG tablet Commonly known as: PLAVIX Take 1 tablet (75 mg total) by mouth daily.   imipramine 50 MG tablet Commonly known as: TOFRANIL Take 2 tablets by mouth at bedtime.   lisinopril 10 MG tablet Commonly known as: ZESTRIL Take 10 mg by mouth daily.   mesalamine 1.2 g EC tablet Commonly known as: LIALDA Take 2.4 g by mouth daily with breakfast.   nicotine polacrilex 2 MG gum Commonly known as: NICORETTE Take 2 mg by mouth as needed for smoking cessation.   OCEANIC-STROKE asundexian or placebo 50 mg tablet Take 1 tablet (50 mg total) by mouth daily. For Investigational Use Only. Take 1 tablet my mouth daily at the same time each day (preferably in the morning). Tablet should be swallowed whole with water; it CANNOT be crushed or broken. Please bring your bottle with you to each research visit. Please contact [facility] for any questions or concerns.   pantoprazole 20 MG tablet Commonly known as: PROTONIX Take 20 mg by mouth daily.        Follow-up Information     Alroy Dust, L.Marlou Sa, MD. Schedule an appointment as soon as possible for a visit in 1 week(s).   Specialty: Family Medicine Contact information: [address] [place] [address] [phone]         Marty Heck, MD Follow up.   Specialty: Vascular Surgery Contact information: [place] [address] [phone]                Allergies  Allergen Reactions   Cortisone Swelling    Facial  swelling Headache   Penicillins Rash    Childhood reaction Has patient had a PCN reaction causing immediate rash, facial/tongue/throat swelling, SOB or lightheadedness with hypotension: Yes Has patient had a PCN reaction causing severe rash involving mucus membranes or skin necrosis: No Has patient had a PCN reaction that required hospitalization No Has patient had a PCN reaction occurring within the last 10 years: No If all of the above answers are "NO", then may proceed with Cephalosporin use.     Consultations: Vascular surgery    Procedures/Studies: VAS [place] GROIN PSEUDOANEURYSM  Result Date: 01/14/2022  ARTERIAL PSEUDOANEURYSM  Patient Name:  Cassandra Alexander  Date of Exam:   01/14/2022 Medical Rec #: [id number]      Accession #:    [id number] Date of Birth: [birth year]      Patient Gender: F Patient Age:   77 years Exam  Location:  [facility] Procedure:      VAS [place] Gloriajean Dell Referring Phys: Monica Martinez --------------------------------------------------------------------------------  Exam: Right groin Indications: Patient complains of groin pain, bruising and active bleed visualized on CT abdomen 01-13-2022 post-compression. Comparison Study: 01-13-2022 Lower extremity pseudoaneurysm compression study                   showed a subcentimeter residual lumen. Performing Technologist: Darlin Coco RDMS, RVT  Examination Guidelines: A complete evaluation includes B-mode imaging, spectral Doppler, color Doppler, and power Doppler as needed of all accessible portions of each vessel. Bilateral testing is considered an integral part of a complete examination. Limited examinations for reoccurring indications may be performed as noted. +------------+----------+---------+------+----------+ Right DuplexPSV (cm/s)Waveform PlaqueComment(s) +------------+----------+---------+------+----------+ Prox SFA       202    triphasic                  +------------+----------+---------+------+----------+  Summary: A partially thrombosed pseudoaneurysm with residual lumen of 0.74 x 0.55 cm was visualized arising from the proximal SFA. Compression was performed for 15 minutes. Post-compression, no flow was visualized within the previously observed residual lumen. The SFA, DFA, and distal PTA were all observed to be patent. Post-compression hematoma measured 5.6 x 2.8 x 2.3 cm. Diagnosing physician: Jamelle Haring Electronically signed by Jamelle Haring on 01/14/2022 at 4:00:07 PM.    --------------------------------------------------------------------------------    Final    VAS [place] LOWER EXT ARTERIAL PSEUDOANEURYSM COMPRESSION  Result Date: 01/14/2022  ARTERIAL PSEUDOANEURYSM  Patient Name:  Cassandra Alexander  Date of Exam:   01/13/2022 Medical Rec #: [id number]      Accession #:    [id number] Date of Birth: [birth year]      Patient Gender: F Patient Age:   64 years Exam Location:  [facility] Procedure:      VAS [place] LOWER EXT ARTERIAL PSEUDOANEURYSM COMPRESSION Referring Phys: Monica Martinez --------------------------------------------------------------------------------  Exam: Right groin Indications: Patient complains of groin pain and bruising. History: S/p catheterization 2.3 x 2.9 cm pseudoaneurysm on ultrasound evaluation. Comparison Study: 01-13-2022 Right lower extremity arterial pseudoaneurysm                   evaluation. Performing Technologist: Darlin Coco RDMS, RVT  Examination Guidelines: A complete evaluation includes B-mode imaging, spectral Doppler, color Doppler, and power Doppler as needed of all accessible portions of each vessel. Bilateral testing is considered an integral part of a complete examination. Limited examinations for reoccurring indications may be performed as noted. +------------+----------+---------+------+----------+ Right DuplexPSV (cm/s)Waveform PlaqueComment(s) +------------+----------+---------+------+----------+  CFA             74    triphasic                 +------------+----------+---------+------+----------+ PFA             63    triphasic                 +------------+----------+---------+------+----------+ Prox SFA       137    triphasic                 +------------+----------+---------+------+----------+  Summary: Pseudoaneurysm was identified arising from the proximal SFA at the bifurcation. Pressure was held for 35 minutes. Residual lumen measured 77m x 243m The SFA, PFA, and distal PTA were all observed to be patent with triphasic waveforms post-compression. Diagnosing physician: ThJamelle Haringlectronically signed by ThJamelle Haringn 01/14/2022 at 3:59:31 PM.    --------------------------------------------------------------------------------  Final    CT Abdomen Pelvis W Contrast  Result Date: 01/13/2022 CLINICAL DATA:  Right groin bruising and pain following mechanical thrombectomy last week via right femoral line, procedure dated 01/06/2022. Concern for anemia and retroperitoneal bleed. Abdominal pain. EXAM: CT ABDOMEN AND PELVIS WITH CONTRAST TECHNIQUE: Multidetector CT imaging of the abdomen and pelvis was performed using the standard protocol following bolus administration of intravenous contrast. RADIATION DOSE REDUCTION: This exam was performed according to the departmental dose-optimization program which includes automated exposure control, adjustment of the mA and/or kV according to patient size and/or use of iterative reconstruction technique. CONTRAST:  63m OMNIPAQUE IOHEXOL 350 MG/ML SOLN COMPARISON:  PET-CT 06/08/2021, CT abdomen pelvis with IV contrast 03/17/2021, 09/06/2019. FINDINGS: Lower chest: Lung bases again demonstrate subsegmental linear atelectasis and a calcified left lower lobe posterior basal granuloma. There is mild cardiomegaly. There are coronary artery calcifications. No pericardial effusion. Small hiatal hernia. Hepatobiliary: There is mild hepatic  steatosis without mass enhancement. Gallbladder and bile ducts are unremarkable. Pancreas: No focal abnormality, ductal dilatation or inflammatory changes. Spleen: No focal abnormality or splenomegaly. Adrenals/Urinary Tract: There is no adrenal or renal cortical mass enhancement. Again, a thinly septated and otherwise homogeneous 5 cm cyst in the medial upper pole left kidney is noted measuring 19 Hounsfield units both initially in the delayed phase. This is unchanged in size dating back to 09/06/2019. No urinary stone or obstruction seen. No bladder thickening. Stomach/Bowel: Small hiatal hernia. Moderate thickened folds in the stomach including the antro pyloric area. The unopacified small bowel is unremarkable. Large volume of stool in the cecum, ascending and transverse colon again is noted. There are colonic diverticula without evidence of wall thickening or inflammatory change. An appendix is not seen in this patient. Patent sigmoid surgical anastomosis is again seen in the left lower abdominopelvic quadrant. Vascular/Lymphatic: On series 3 axial image 74 and coronal reconstruction image 31, there is a right groin hematoma overlying and compressing and significantly narrowing the proximal right superficial femoral artery and measuring 3.8 x 3.1 x 6.7 cm. There is a linear contrast leak into the hematoma from the anterior wall of the proximal right superficial femoral artery on 3:70 and 71 consistent with active bleed. The anterior compartment thigh musculature overlying this is asymmetrically larger with likely intramuscular hematoma, but this is difficult to measure as it is only partially imaged and is isodense. Clinical correlation is recommended to exclude elevated anterior thigh compartment pressures. Below this level the proximal superficial femoral artery becomes normal in caliber. There is only mild narrowing in the proximal deep right femoral artery, related to mass effect from the hematoma. Below  the hematoma this artery is normal caliber also. No adenopathy is seen. No retroperitoneal hematoma. There is moderate aortoiliac calcific plaque without AAA. There are numerous pelvic phleboliths. Reproductive: Status post hysterectomy. No adnexal masses. Other: There is no free fluid, free hemorrhage or free air. There is no incarcerated hernia. Musculoskeletal: There is old L4-5 posterior fusion hardware with interbody metallic disc space apparatus, with lumbar S shaped scoliosis and advanced degenerative changes with osteopenia. No new bone abnormality. IMPRESSION: 1. 3.8 x 3.1 x 6.7 cm right groin hematoma overlying and compressing and significantly narrowing the proximal right superficial femoral artery. Infectious complication not excluded. 2. There is a linear contrast leak into the hematoma from the anterior wall of the proximal right superficial femoral artery consistent with active bleed. 3. The anterior compartment thigh musculature overlying this hematoma is asymmetrically larger with likely intramuscular  hematoma, but this is difficult to measure as it is only partially imaged and isodense. Clinical correlation is recommended to exclude elevated anterior thigh compartment pressures. Again, infectious complication is not excluded. 4. No retroperitoneal hematoma. 5. Constipation and diverticulosis. 6. Aortic and coronary artery atherosclerosis. 7. Small hiatal hernia with probable gastritis. Endoscopy may be indicated given the degree of wall thickening. 8. Stable 5 cm Bosniak 2 thinly septated cyst in the upper pole left kidney. Aortic Atherosclerosis (ICD10-I70.0). Electronically Signed   By: Telford Nab M.D.   On: 01/13/2022 20:43   VAS [place] GROIN PSEUDOANEURYSM  Result Date: 01/13/2022  ARTERIAL PSEUDOANEURYSM  Patient Name:  INDRIA BISHARA  Date of Exam:   01/13/2022 Medical Rec #: [id number]      Accession #:    [id number] Date of Birth: 1945-03-24      Patient Gender: F Patient Age:   63 years  Exam Location:  [facility] Procedure:      VAS [place] GROIN PSEUDOANEURYSM Referring Phys: Sherlie Ban MATTHEWS --------------------------------------------------------------------------------  Exam: Right groin Indications: Patient complains of groin pain and bruising. History: S/p catheterization. Performing Technologist: Archie Patten RVS  Examination Guidelines: A complete evaluation includes B-mode imaging, spectral Doppler, color Doppler, and power Doppler as needed of all accessible portions of each vessel. Bilateral testing is considered an integral part of a complete examination. Limited examinations for reoccurring indications may be performed as noted. +------------+----------+---------+------+----------+ Right DuplexPSV (cm/s)Waveform PlaqueComment(s) +------------+----------+---------+------+----------+ CFA            175    triphasic                 +------------+----------+---------+------+----------+ Prox SFA        87    triphasic                 +------------+----------+---------+------+----------+  Findings: An area with well defined borders measuring 2.3 cm x 2.9 cm was visualized arising off of the Distal CFA with ultrasound characteristics of a pseudoaneurysm. The neck measures approximately 0.1 cm wide and 0.1 cm long. Pseduo neck appears very short measuring <34m.  Diagnosing physician: CMonica MartinezMD Electronically signed by CMonica MartinezMD on 01/13/2022 at 4:26:08 PM.    --------------------------------------------------------------------------------    Final    IR PERCUTANEOUS ART THROMBECTOMY/INFUSION INTRACRANIAL INC DIAG ANGIO  Result Date: 01/08/2022 INDICATION: History of acute change in mental status, and diffuse weakness and left sided nystagmus. 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 EXAM: 1. EMERGENT LARGE VESSEL OCCLUSION THROMBOLYSIS (POSTERIOR CIRCULATION) COMPARISON:  None Available. MEDICATIONS: Ancef  2 g IV antibiotic was administered within 1 hour of the procedure. ANESTHESIA/SEDATION: General anesthesia. CONTRAST:  Omnipaque 300 approximately 90 cc. FLUOROSCOPY TIME:  Fluoroscopy Time: 53 minutes 12 seconds (1411 mGy). COMPLICATIONS: None immediate. TECHNIQUE: Following a full explanation of the procedure along with the potential associated complications, an informed witnessed consent was obtained. The risks of intracranial hemorrhage of 10%, worsening neurological deficit, ventilator dependency, death and inability to revascularize were all reviewed in detail with the patient's husband. The patient was then put under general anesthesia [facility] The right groin was prepped and draped in the usual sterile fashion. Thereafter using modified Seldinger technique, transfemoral access into the right common femoral artery was obtained without difficulty. Over a 0.035 inch guidewire an 8 [place]15 cm [place]Pinnacle sheath was inserted. Through this, and also over a 0.035 inch guidewire a 5.5 FP[place]125  cm Berenstein support catheter inside of an 088 95 cm Neuron Max sheath was advanced to the aortic arch region. The support catheter was then advanced into the right subclavian artery just proximal to the origin of the right vertebral artery. The guidewire was removed. Good aspiration was obtained from the hub of the Neuron Max sheath. Control arteriogram at the origin of the right vertebral artery was then performed centered extra cranially and intracranially. FINDINGS: The right vertebral artery origin demonstrated patency proximally, with a moderate kink distal to this. More distally, the vessel is seen to opacify to the cranial skull base. Contrast was seen extending into the proximal basilar artery. PROCEDURE: Attempts were made to advanced an intermediary catheter through the right vertebral artery distally with an 035 inch guidewire, 024 micro guidewire with an  027 microcatheter without success due to the extreme of the innominate artery origin from the aortic arch resulting in herniation of the platform into the aortic arch. Eventually access was obtained using a 125 cm 4 French sheath inside of an 80 cm Infinity guide catheter. The Infinity guide sheath could only be advanced into the proximal 1/3 of the right vertebral artery with further advancement resulting in herniation of the platform into the aortic arch. The guide sheath was then exchanged for a 300 cm 014 inch BMW support guidewire in the V3 region of the right vertebral artery. An 021 160 cm microcatheter inside of a 132 cm 055 Zoom aspiration catheter was advanced without difficulty to the proximal basilar artery. The micro guidewire was then gently advanced without difficulty into the left posterior cerebral artery P2 region followed by the microcatheter. The guidewire was removed. Good aspiration was obtained from the hub of the microcatheter which was connected to continuous heparinized saline infusion. At this time, the Zoom aspiration catheter was advanced without difficulty into the proximal P2 region. A 4 mm x 40 mm Solitaire X retrieval device was then deployed. Thereafter, the proximal aspiration at the hub of the Zoom aspiration catheter with a Penumbra pump, and at the hub of the Franklin guide catheter with a 20 mL syringe, the combination of the retrieval device, the Zoom aspiration catheter was retrieved and removed. A control arteriogram performed through the Infinity guide catheter in the proximal right vertebral artery demonstrated revascularization of the distal basilar artery with opacification of the bilaterally duplicated superior cerebellar artery branches, the anterior-inferior cerebellar arteries, and also of the left posterior cerebral artery. The right PCA opacified only transiently due to inflow from the inferior circulation via the posterior communicating artery. A TICI 2C was  achieved. A final control arteriogram performed through the Infinity guide catheter in the right subclavian artery demonstrates the right vertebral artery to be widely patent extra cranially and intracranially. An 8 French Angio-Seal closure device was deployed for hemostasis at the right groin puncture site after removal of the Pinnacle sheath. Distal pulses remained Dopplerable in both feet unchanged. CT brain demonstrated no evidence of intracranial hemorrhage. The patient was left intubated due to her medical condition to protect the airway. She was then transferred to the PACU and then neuro ICU for post revascularization care. IMPRESSION: Endovascular complete revascularization of occluded distal basilar artery with 1 pass with a 4 mm x 40 mm Solitaire X retrieval device and contact aspiration achieving a TICI 2C revascularization. PLAN: Follow-up CT angiogram of the head and neck in six-months. Electronically Signed   By: Luanne Bras M.D.   On: 01/08/2022 07:44   IR  CT Head Ltd  Result Date: 01/08/2022 INDICATION: History of acute change in mental status, and diffuse weakness and left sided nystagmus. 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 EXAM: 1. EMERGENT LARGE VESSEL OCCLUSION THROMBOLYSIS (POSTERIOR CIRCULATION) COMPARISON:  None Available. MEDICATIONS: Ancef 2 g IV antibiotic was administered within 1 hour of the procedure. ANESTHESIA/SEDATION: General anesthesia. CONTRAST:  Omnipaque 300 approximately 90 cc. FLUOROSCOPY TIME:  Fluoroscopy Time: 53 minutes 12 seconds (1411 mGy). COMPLICATIONS: None immediate. TECHNIQUE: Following a full explanation of the procedure along with the potential associated complications, an informed witnessed consent was obtained. The risks of intracranial hemorrhage of 10%, worsening neurological deficit, ventilator dependency, death and inability to revascularize were all reviewed in detail with the patient's husband. The  patient was then put under general anesthesia by the Department of Anesthesiology at [facility]. The right groin was prepped and draped in the usual sterile fashion. Thereafter using modified Seldinger technique, transfemoral access into the right common femoral artery was obtained without difficulty. Over a 0.035 inch guidewire an 8 [place] 15 cm [place] Pinnacle sheath was inserted. Through this, and also over a 0.035 inch guidewire a 5.5 French 125 cm Berenstein support catheter inside of an 088 95 cm Neuron Max sheath was advanced to the aortic arch region. The support catheter was then advanced into the right subclavian artery just proximal to the origin of the right vertebral artery. The guidewire was removed. Good aspiration was obtained from the hub of the Neuron Max sheath. Control arteriogram at the origin of the right vertebral artery was then performed centered extra cranially and intracranially. FINDINGS: The right vertebral artery origin demonstrated patency proximally, with a moderate kink distal to this. More distally, the vessel is seen to opacify to the cranial skull base. Contrast was seen extending into the proximal basilar artery. PROCEDURE: Attempts were made to advanced an intermediary catheter through the right vertebral artery distally with an 035 inch guidewire, 024 micro guidewire with an 027 microcatheter without success due to the extreme of the innominate artery origin from the aortic arch resulting in herniation of the platform into the aortic arch. Eventually access was obtained using a 125 cm 4 French sheath inside of an 80 cm Infinity guide catheter. The Infinity guide sheath could only be advanced into the proximal 1/3 of the right vertebral artery with further advancement resulting in herniation of the platform into the aortic arch. The guide sheath was then exchanged for a 300 cm 014 inch BMW support guidewire in the V3 region of the right vertebral artery. An 021 160 cm  microcatheter inside of a 132 cm 055 Zoom aspiration catheter was advanced without difficulty to the proximal basilar artery. The micro guidewire was then gently advanced without difficulty into the left posterior cerebral artery P2 region followed by the microcatheter. The guidewire was removed. Good aspiration was obtained from the hub of the microcatheter which was connected to continuous heparinized saline infusion. At this time, the Zoom aspiration catheter was advanced without difficulty into the proximal P2 region. A 4 mm x 40 mm Solitaire X retrieval device was then deployed. Thereafter, the proximal aspiration at the hub of the Zoom aspiration catheter with a Penumbra pump, and at the hub of the Cope guide catheter with a 20 mL syringe, the combination of the retrieval device, the Zoom aspiration catheter was retrieved and removed. A control arteriogram performed through the Infinity guide catheter in the proximal right vertebral artery  demonstrated revascularization of the distal basilar artery with opacification of the bilaterally duplicated superior cerebellar artery branches, the anterior-inferior cerebellar arteries, and also of the left posterior cerebral artery. The right PCA opacified only transiently due to inflow from the inferior circulation via the posterior communicating artery. A TICI 2C was achieved. A final control arteriogram performed through the Infinity guide catheter in the right subclavian artery demonstrates the right vertebral artery to be widely patent extra cranially and intracranially. An 8 French Angio-Seal closure device was deployed for hemostasis at the right groin puncture site after removal of the Pinnacle sheath. Distal pulses remained Dopplerable in both feet unchanged. CT brain demonstrated no evidence of intracranial hemorrhage. The patient was left intubated due to her medical condition to protect the airway. She was then transferred to the PACU and then neuro ICU  for post revascularization care. IMPRESSION: Endovascular complete revascularization of occluded distal basilar artery with 1 pass with a 4 mm x 40 mm Solitaire X retrieval device and contact aspiration achieving a TICI 2C revascularization. PLAN: Follow-up CT angiogram of the head and neck in six-months. Electronically Signed   By: Luanne Bras M.D.   On: 01/08/2022 07:44   ECHOCARDIOGRAM COMPLETE  Result Date: 01/07/2022    ECHOCARDIOGRAM REPORT   Patient Name:   Cassandra Alexander Date of Exam: 01/07/2022 Medical Rec #:  [id number]     Height:       60.0 in Accession #:    [id number]    Weight:       112.9 lb Date of Birth:  13-Jun-1945     BSA:          1.464 m Patient Age:    56 years      BP:           135/66 mmHg Patient Gender: F             HR:           74 bpm. Exam Location:  Inpatient Procedure: 2D Echo, Color Doppler and Cardiac Doppler Indications:    Stroke  History:        Patient has prior history of Echocardiogram examinations, most                 recent 09/26/2021. TIA; Risk Factors:Dyslipidemia.  Sonographer:    Memory Argue Referring Phys: [place]  1. Left ventricular ejection fraction, by estimation, is 60 to 65%. The left ventricle has normal function. The left ventricle has no regional wall motion abnormalities. Left ventricular diastolic parameters are indeterminate.  2. Right ventricular systolic function is normal. The right ventricular size is normal.  3. The mitral valve is normal in structure. Trivial mitral valve regurgitation. No evidence of mitral stenosis.  4. The aortic valve is tricuspid. Aortic valve regurgitation is not visualized. No aortic stenosis is present.  5. The inferior vena cava is normal in size with greater than 50% respiratory variability, suggesting right atrial pressure of 3 mmHg. FINDINGS  Left Ventricle: Left ventricular ejection fraction, by estimation, is 60 to 65%. The left ventricle has normal function. The left ventricle has  no regional wall motion abnormalities. The left ventricular internal cavity size was normal in size. There is  no left ventricular hypertrophy. Left ventricular diastolic parameters are indeterminate. Right Ventricle: The right ventricular size is normal. No increase in right ventricular wall thickness. Right ventricular systolic function is normal. Left Atrium: Left atrial size was normal in size. Right Atrium:  Right atrial size was normal in size. Pericardium: There is no evidence of pericardial effusion. Presence of epicardial fat layer. Mitral Valve: The mitral valve is normal in structure. Trivial mitral valve regurgitation. No evidence of mitral valve stenosis. Tricuspid Valve: The tricuspid valve is normal in structure. Tricuspid valve regurgitation is not demonstrated. No evidence of tricuspid stenosis. Aortic Valve: The aortic valve is tricuspid. Aortic valve regurgitation is not visualized. No aortic stenosis is present. Aortic valve mean gradient measures 2.0 mmHg. Aortic valve peak gradient measures 4.5 mmHg. Aortic valve area, by VTI measures 2.67 cm. Pulmonic Valve: The pulmonic valve was normal in structure. Pulmonic valve regurgitation is not visualized. No evidence of pulmonic stenosis. Aorta: The aortic root is normal in size and structure. Venous: The inferior vena cava is normal in size with greater than 50% respiratory variability, suggesting right atrial pressure of 3 mmHg. IAS/Shunts: No atrial level shunt detected by color flow Doppler.  LEFT VENTRICLE PLAX 2D LVIDd:         4.20 cm   Diastology LVIDs:         3.00 cm   LV e' medial:    6.37 cm/s LV PW:         0.90 cm   LV E/e' medial:  10.8 LV IVS:        1.00 cm   LV e' lateral:   6.84 cm/s LVOT diam:     2.00 cm   LV E/e' lateral: 10.1 LV SV:         50 LV SV Index:   34 LVOT Area:     3.14 cm  RIGHT VENTRICLE RV S prime:     14.80 cm/s LEFT ATRIUM             Index        RIGHT ATRIUM          Index LA Vol (A2C):   27.5 ml 18.79 ml/m   RA Area:     7.19 cm LA Vol (A4C):   21.3 ml 14.55 ml/m  RA Volume:   12.10 ml 8.27 ml/m LA Biplane Vol: 26.6 ml 18.17 ml/m  AORTIC VALVE AV Area (Vmax):    2.64 cm AV Area (Vmean):   2.48 cm AV Area (VTI):     2.67 cm AV Vmax:           106.00 cm/s AV Vmean:          70.000 cm/s AV VTI:            0.186 m AV Peak Grad:      4.5 mmHg AV Mean Grad:      2.0 mmHg LVOT Vmax:         89.20 cm/s LVOT Vmean:        55.200 cm/s LVOT VTI:          0.158 m LVOT/AV VTI ratio: 0.85  AORTA Ao Root diam: 3.20 cm Ao Asc diam:  3.10 cm MITRAL VALVE MV Area (PHT): 3.65 cm    SHUNTS MV Decel Time: 208 msec    Systemic VTI:  0.16 m MV E velocity: 68.80 cm/s  Systemic Diam: 2.00 cm MV A velocity: 91.50 cm/s MV E/A ratio:  0.75 Kardie Tobb DO Electronically signed by Berniece Salines DO Signature Date/Time: 01/07/2022/2:15:40 PM    Final    MR BRAIN WO CONTRAST  Result Date: 01/07/2022 CLINICAL DATA:  Stroke follow-up EXAM: MRI HEAD WITHOUT CONTRAST TECHNIQUE: Multiplanar, multiecho pulse sequences of the  brain and surrounding structures were obtained without intravenous contrast. COMPARISON:  CTA head/neck 01/06/22 FINDINGS: Brain: Acute infarcts in the bilateral cerebellar hemispheres (series 3, image 14). Small foci of acute infarcts are also seen in the pons (series 3, image 16). There is no evidence of hemorrhage. Sequela of moderate chronic microvascular ischemic change with a chronic infarct in the right centrum semiovale. There is incomplete suppression of fluid signal on FLAIR imaging in the bilateral parietal and occipital lobes. These areas do not demonstrate signal on susceptibility weighted imaging. No hydrocephalus. No extra-axial fluid collection. Vascular: There is diminished flow void at the tip of the basilar artery (series 6, image 10), nonspecific. Skull and upper cervical spine: Normal marrow signal. Sinuses/Orbits: Bilateral lens replacement. Mild mucosal thickening bilateral sphenoid sinuses. Other: None.  IMPRESSION: 1. 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 No evidence of hemorrhage. 2. Diminished flow void at the tip of the basilar artery, nonspecific, but possibly secondary to recent intervention. 3. Incomplete suppression of fluid signal on FLAIR imaging in the bilateral parietal and occipital lobes, which is favored to be artifactual. Electronically Signed   By: Marin Roberts M.D.   On: 01/07/2022 10:00   DG Chest Port 1 View  Result Date: 01/07/2022 CLINICAL DATA:  Provided history: ETT.  Stroke. EXAM: PORTABLE CHEST 1 VIEW COMPARISON:  Prior chest radiographs 01/06/2022 and earlier. Chest CT 05/01/2021. FINDINGS: ET tube present with tip terminating 2.4 cm above the level of the carina. An enteric tube passes below the level left hemidiaphragm. The tip of the enteric tube or the incompletely imaged side-port is present at the level of the gastric body. A loop recorder device overlies the left chest. Heart size within normal limits. Aortic atherosclerosis. Persistent mild ill-defined opacity within the right lung base. Mild atelectasis within the left lung base. The 1.4 cm focus of nodular airspace consolidation demonstrated within the left upper lobe on the prior chest CT of 05/01/2021 is occult on the current examination. Mild chronic elevation of the right hemidiaphragm. No evidence of pleural effusion or pneumothorax. No acute bony abnormality identified. IMPRESSION: Support apparatus as described. Persistent mild ill-defined opacity within the right lung base. This has an appearance favoring atelectasis. However, aspiration/pneumonia cannot be excluded. Mild atelectasis within the left lung base. The 1.4 cm focus of nodular airspace consolidation demonstrated within the left upper lobe on the prior chest CT of 05/01/2021 is occult on the current examination. Please refer to this prior examination for further description and for follow-up recommendations. Electronically Signed    By: Kellie Simmering D.O.   On: 01/07/2022 09:16   DG Abd Portable 1V  Result Date: 01/06/2022 CLINICAL DATA:  Enteric catheter placement EXAM: PORTABLE ABDOMEN - 1 VIEW COMPARISON:  None Available. FINDINGS: Frontal view of the lower chest and upper abdomen demonstrates enteric catheter passing below diaphragm tip and side port projecting over the gastric body. Lung bases are clear. Excreted contrast within the kidneys. Bowel gas pattern is unremarkable. IMPRESSION: 1. Enteric catheter tip and side port projecting over gastric body. Electronically Signed   By: Randa Ngo M.D.   On: 01/06/2022 18:19   DG Chest Portable 1 View  Result Date: 01/06/2022 CLINICAL DATA:  Provided history: Altered mental status. Code stroke. EXAM: PORTABLE CHEST 1 VIEW COMPARISON:  Chest radiographs 02/28/2020 and earlier. Chest CT 05/01/2021. FINDINGS: A loop recorder device overlies the left chest. Heart size within normal limits. Aortic atherosclerosis. Mild atelectasis within the right lung base. The 1.4  cm focus of nodular airspace consolidation demonstrated within the left upper lobe on the prior chest CT of 05/01/2021 is occult on the current exam. Mild chronic elevation of the right hemidiaphragm. No evidence of pleural effusion or pneumothorax. No acute bony abnormality identified. Levocurvature of the thoracic spine. Thoracic spondylosis. IMPRESSION: Mild atelectasis within the right lung base. The 1.4 cm focus of nodular airspace consolidation demonstrated within the left upper lobe on the prior chest CT of 05/01/2021 is occult on the current examination. Please refer to this prior examination for further description and for follow-up recommendations. Aortic Atherosclerosis (ICD10-I70.0). Electronically Signed   By: Kellie Simmering D.O.   On: 01/06/2022 09:16   CT ANGIO HEAD NECK W WO CM W PERF (CODE STROKE)  Result Date: 01/06/2022 CLINICAL DATA:  Neuro deficit, acute, stroke suspected. EXAM: CT ANGIOGRAPHY HEAD  AND NECK CT PERFUSION BRAIN TECHNIQUE: Multidetector CT imaging of the head and neck was performed using the standard protocol during bolus administration of intravenous contrast. Multiplanar CT image reconstructions and MIPs were obtained to evaluate the vascular anatomy. Carotid stenosis measurements (when applicable) are obtained utilizing NASCET criteria, using the distal internal carotid diameter as the denominator. Multiphase CT imaging of the brain was performed following IV bolus contrast injection. Subsequent parametric perfusion maps were calculated using RAPID software. RADIATION DOSE REDUCTION: This exam was performed according to the departmental dose-optimization program which includes automated exposure control, adjustment of the mA and/or kV according to patient size and/or use of iterative reconstruction technique. CONTRAST:  19m OMNIPAQUE IOHEXOL 350 MG/ML SOLN COMPARISON:  Head CT same day. MRI 09/25/2021. CT angiography 09/25/2021. FINDINGS: CTA NECK FINDINGS Aortic arch: Aortic atherosclerosis. Innominate artery origin not included on the scan. Small left vertebral artery arises directly from the arch Right carotid system: Common carotid artery widely patent to the bifurcation. Carotid bifurcation is normal without soft or calcified plaque. Cervical ICA is normal. Left carotid system: Common carotid artery widely patent to the bifurcation. Very minimal plaque at the bifurcation but no stenosis or irregularity. Cervical ICA widely patent. Vertebral arteries: Dominant right vertebral artery arises normally from the subclavian artery and is widely patent through the cervical region to the foramen magnum. As noted above, left vertebral artery is a tiny vessel that arises from the arch in actually has a second origin at the left subclavian artery, the 2 branches coming together in the transverse foramen of the lower cervical spine. Beyond that, the vessel is patent to the foramen magnum. Skeleton:  Scoliosis without significant degenerative change. Other neck: No mass or lymphadenopathy. Upper chest: Emphysema without acute process. Review of the MIP images confirms the above findings CTA HEAD FINDINGS Anterior circulation: Right internal carotid artery widely patent through the skull base and siphon region. 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 Limited evaluation because of dense streak artifact the anterior and middle cerebral vessels are patent. No large vessel occlusion or proximal stenosis. Left internal carotid arteries widely patent through the skull base and siphon region. The anterior and middle cerebral vessels are normal without aneurysm or stenosis. Posterior circulation: Dominant right vertebral artery is widely patent to the basilar artery. Right PICA shows flow. Non dominant left vertebral artery is occluded in the V4 segment. The basilar artery shows flow within both anterior inferior cerebellar arteries. There is an embolus at the basilar tip. Flow is seen within both posterior cerebral arteries. Venous sinuses: Patent and normal Anatomic variants: None significant otherwise Review of the MIP images  confirms the above findings CT Brain Perfusion Findings: ASPECTS: 10 CBF (<30%) Volume: 56m Perfusion (Tmax>6.0s) volume: 037mMismatch Volume: 66m4mnfarction Location:None demonstrated IMPRESSION: 1. 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 Basilar artery shows flow proximally but there is an embolus at the basilar tip. Some flow is seen within both posterior cerebral arteries. 2. 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 Limited evaluation because of dense streak artifact. No anterior circulation large vessel occlusion or proximal stenosis. 3. No carotid bifurcation disease. 4. No evidence of infarction by CT perfusion imaging. 5. Aortic atherosclerosis. 6. Emphysema without acute process. 7. The left vertebral  artery has 2 origins, 1 arising directly from the aortic arch and a second origin at the left subclavian artery, the 2 branches coming together in the transverse foramen of the lower cervical spine. Beyond that, the vessel is patent to the foramen magnum. Left V4 occlusion described above. Aortic Atherosclerosis (ICD10-I70.0) and Emphysema (ICD10-J43.9). Case discussed with Dr. StaQuinn Axering performance. Basilar tip embolic occlusion communicated at approximally 0905 hours via Amion. Electronically Signed   By: MarNelson ChimesD.   On: 01/06/2022 09:07   CT HEAD CODE STROKE WO CONTRAST  Result Date: 01/06/2022 CLINICAL DATA:  Code stroke.  Neuro deficit, acute, stroke suspected EXAM: CT HEAD WITHOUT CONTRAST TECHNIQUE: Contiguous axial images were obtained from the base of the skull through the vertex without intravenous contrast. RADIATION DOSE REDUCTION: This exam was performed according to the departmental dose-optimization program which includes automated exposure control, adjustment of the mA and/or kV according to patient size and/or use of iterative reconstruction technique. COMPARISON:  CT 09/25/2021. FINDINGS: Brain: No evidence of acute large vascular territory infarction, hemorrhage, hydrocephalus, extra-axial collection or mass lesion/mass effect. Similar small scattered remote infarcts. Vascular: Embolization coils in the right paraclinoid ICA. Skull: No acute fracture. Sinuses/Orbits: No acute finding. Other: No mastoid effusions. ASPECTS ([facility] Early CT Score) total score (0-10 with 10 being normal): 10. IMPRESSION: 1. No evidence of acute intracranial abnormality. 2. ASPECTS is 10. Code stroke imaging results were communicated on 01/06/2022 at 8:30 am to provider [facility] secure text paging. Electronically Signed   By: FreMargaretha SheffieldD.   On: 01/06/2022 08:30   CUP PACEART REMOTE DEVICE CHECK  Result Date: 01/05/2022 ILR summary report received. Battery status OK.  Normal device function. No new symptom, tachy, brady, or pause episodes. No new AF episodes. Monthly summary reports and ROV/PRN LA      Discharge Exam: Vitals:   01/15/22 0412 01/15/22 0754  BP: (!) 162/77 (!) 126/55  Pulse: 73 94  Resp: 15 18  Temp: 97.6 F (36.4 C) 97.7 F (36.5 C)  SpO2: 95% 95%    General: Pt is alert, awake, not in acute distress Cardiovascular: RRR, S1/S2 +, no edema Respiratory: CTA bilaterally, no wheezing, no rhonchi, no respiratory distress, no conversational dyspnea  Abdominal: Soft, NT, ND, bowel sounds + Extremities: RLE with bruising and swelling, stable from previous exam  Psych: Normal mood and affect, stable judgement and insight     The results of significant diagnostics from this hospitalization (including imaging, microbiology, ancillary and laboratory) are listed below for reference.     Microbiology: Recent Results (from the past 240 hour(s))  SARS Coronavirus 2 by RT PCR (hospital order[facility]) *cepheid single result test* Anterior Nasal Swab     Status: None   Collection Time: 01/06/22  9:19  AM   Specimen: Anterior Nasal Swab  Result Value Ref Range Status   SARS Coronavirus 2 by RT PCR NEGATIVE NEGATIVE Final    Comment: (NOTE) SARS-CoV-2 target nucleic acids are NOT DETECTED.  The SARS-CoV-2 RNA is generally detectable in upper and lower respiratory specimens during the acute phase of infection. The lowest concentration of SARS-CoV-2 viral copies this assay can detect is 250 copies / mL. A negative result does not preclude SARS-CoV-2 infection and should not be used as the sole basis for treatment or other patient management decisions.  A negative result may occur with improper specimen collection / handling, submission of specimen other than nasopharyngeal swab, presence of viral mutation(s) within the areas targeted by this assay, and inadequate number of viral copies (<250 copies / mL). A  negative result must be combined with clinical observations, patient history, and epidemiological information.  Fact Sheet for Patients:   https://www.patel.info/  Fact Sheet for Healthcare Providers: https://hall.com/  This test is not yet approved or  cleared by the [place] FDA and has been authorized for detection and/or diagnosis of SARS-CoV-2 by FDA under an Emergency Use Authorization (EUA).  This EUA will remain in effect (meaning this test can be used) for the duration of the COVID-19 declaration under Section 564(b)(1) of the Act, 21 U.S.C. section 360bbb-3(b)(1), unless the authorization is terminated or revoked sooner.  Performed at [facility], [place] [address], [place], [place] [address]      Labs: BNP (last 3 results) No results for input(s): "BNP" in the last 8760 hours. Basic Metabolic Panel: Recent Labs  Lab 01/13/22 1417 01/14/22 0315  NA 143 139  K 3.5 3.5  CL 102 106  CO2 29 25  GLUCOSE 142* 111*  BUN 11 11  CREATININE 0.60 0.54  CALCIUM 9.3 8.3*   Liver Function Tests: Recent Labs  Lab 01/13/22 1417 01/14/22 0315  AST 20 15  ALT 14 11  ALKPHOS 52 47  BILITOT 0.8 1.3*  PROT 6.2* 5.4*  ALBUMIN 3.4* 3.0*   No results for input(s): "LIPASE", "AMYLASE" in the last 168 hours. No results for input(s): "AMMONIA" in the last 168 hours. CBC: Recent Labs  Lab 01/13/22 1417 01/14/22 0315 01/15/22 0113  WBC 7.6 8.6 7.8  NEUTROABS 5.4  --   --   HGB 8.1* 8.5* 8.2*  HCT 25.7* 26.6* 25.4*  MCV 97.7 92.4 92.4  PLT 235 188 189   Cardiac Enzymes: No results for input(s): "CKTOTAL", "CKMB", "CKMBINDEX", "TROPONINI" in the last 168 hours. BNP: Invalid input(s): "POCBNP" CBG: Recent Labs  Lab 01/08/22 1228  GLUCAP 117*   D-Dimer No results for input(s): "DDIMER" in the last 72 hours. Hgb A1c No results for input(s): "HGBA1C" in the last 72 hours. Lipid Profile No results for input(s):  "CHOL", "HDL", "LDLCALC", "TRIG", "CHOLHDL", "LDLDIRECT" in the last 72 hours. Thyroid function studies No results for input(s): "TSH", "T4TOTAL", "T3FREE", "THYROIDAB" in the last 72 hours.  Invalid input(s): "FREET3" Anemia work up No results for input(s): "VITAMINB12", "FOLATE", "FERRITIN", "TIBC", "IRON", "RETICCTPCT" in the last 72 hours. Urinalysis    Component Value Date/Time   COLORURINE STRAW (A) 01/06/2022 1749   APPEARANCEUR CLEAR 01/06/2022 1749   LABSPEC 1.032 (H) 01/06/2022 1749   PHURINE 7.0 01/06/2022 1749   GLUCOSEU NEGATIVE 01/06/2022 1749   HGBUR NEGATIVE 01/06/2022 1749   HGBUR negative 03/31/2009 [place] 01/06/2022 1749   BILIRUBINUR 1+ 06/09/2010 0000   KETONESUR 5 (A) 01/06/2022 1749  PROTEINUR NEGATIVE 01/06/2022 1749   UROBILINOGEN 0.2 06/09/2010 0000   UROBILINOGEN 0.2 03/31/2009 1031   NITRITE NEGATIVE 01/06/2022 1749   LEUKOCYTESUR NEGATIVE 01/06/2022 1749   Sepsis Labs Recent Labs  Lab 01/13/22 1417 01/14/22 0315 01/15/22 0113  WBC 7.6 8.6 7.8   Microbiology Recent Results (from the past 240 hour(s))  SARS Coronavirus 2 by RT PCR (hospital order, performed in [place] hospital lab) *cepheid single result test* Anterior Nasal Swab     Status: None   Collection Time: 01/06/22  9:19 AM   Specimen: Anterior Nasal Swab  Result Value Ref Range Status   SARS Coronavirus 2 by RT PCR NEGATIVE NEGATIVE Final    Comment: (NOTE) SARS-CoV-2 target nucleic acids are NOT DETECTED.  The SARS-CoV-2 RNA is generally detectable in upper and lower respiratory specimens during the acute phase of infection. The lowest concentration of SARS-CoV-2 viral copies this assay can detect is 250 copies / mL. A negative result does not preclude SARS-CoV-2 infection and should not be used as the sole basis for treatment or other patient management decisions.  A negative result may occur with improper specimen collection / handling, submission of  specimen other than nasopharyngeal swab, presence of viral mutation(s) within the areas targeted by this assay, and inadequate number of viral copies (<250 copies / mL). A negative result must be combined with clinical observations, patient history, and epidemiological information.  Fact Sheet for Patients:   https://www.patel.info/  Fact Sheet for Healthcare Providers: https://hall.com/  This test is not yet approved or  cleared by the [place] FDA and has been authorized for detection and/or diagnosis of SARS-CoV-2 by FDA under an Emergency Use Authorization (EUA).  This EUA will remain in effect (meaning this test can be used) for the duration of the COVID-19 declaration under Section 564(b)(1) of the Act, 21 U.S.C. section 360bbb-3(b)(1), unless the authorization is terminated or revoked sooner.  Performed at [facility], [place] [address] [place], [place] [address]      Patient was seen and examined on the day of discharge and was found to be in stable condition. Time coordinating discharge: 25 minutes including assessment and coordination of care, as well as examination of the patient.   SIGNED:  Dessa Phi, DO [facility] 01/15/2022, 11:04 AM

## 2022-01-15 NOTE — Anesthesia Procedure Notes (Signed)
Arterial Line Insertion Start/End11/05/2021 5:05 PM, 01/15/2022 5:18 PM Performed by: Effie Berkshire, MD, anesthesiologist  Patient location: Pre-op. Preanesthetic checklist: patient identified, IV checked, site marked, risks and benefits discussed, surgical consent, monitors and equipment checked, pre-op evaluation, timeout performed and anesthesia consent Lidocaine 1% used for infiltration Left, radial was placed Catheter size: 20 G Hand hygiene performed  and maximum sterile barriers used   Attempts: 2 Procedure performed without using ultrasound guided technique. Following insertion, dressing applied. Post procedure assessment: normal and unchanged  Post procedure complications: unsuccessful attempts. Patient tolerated the procedure well with no immediate complications. Additional procedure comments: Unable to thread catheter on R, Successful placement on L. Marland Kitchen

## 2022-01-15 NOTE — Anesthesia Preprocedure Evaluation (Addendum)
Anesthesia Evaluation  Patient identified by MRN, date of birth, ID band Patient awake    Reviewed: Allergy & Precautions, NPO status , Patient's Chart, lab work & pertinent test resultsPreop documentation limited or incomplete due to emergent nature of procedure.  Airway Mallampati: II  TM Distance: >3 FB Neck ROM: Full    Dental  (+) Dental Advisory Given, Teeth Intact   Pulmonary former smoker   breath sounds clear to auscultation       Cardiovascular hypertension, Pt. on medications  Rhythm:regular Rate:Normal  Echo: 1. Left ventricular ejection fraction, by estimation, is 60 to 65%. The  left ventricle has normal function. The left ventricle has no regional  wall motion abnormalities. Left ventricular diastolic parameters are  indeterminate.   2. Right ventricular systolic function is normal. The right ventricular  size is normal.   3. The mitral valve is normal in structure. Trivial mitral valve  regurgitation. No evidence of mitral stenosis.   4. The aortic valve is tricuspid. Aortic valve regurgitation is not  visualized. No aortic stenosis is present.   5. The inferior vena cava is normal in size with greater than 50%  respiratory variability, suggesting right atrial pressure of 3 mmHg.     Neuro/Psych  Headaches PSYCHIATRIC DISORDERS Anxiety Depression    CODE STROKE TIACVA    GI/Hepatic PUD,GERD  ,,  Endo/Other    Renal/GU      Musculoskeletal  (+) Arthritis ,    Abdominal   Peds  Hematology   Anesthesia Other Findings   Reproductive/Obstetrics                             Anesthesia Physical Anesthesia Plan  ASA: 4 and emergent  Anesthesia Plan: General   Post-op Pain Management:    Induction: Intravenous  PONV Risk Score and Plan: 3 and Ondansetron, Dexamethasone and Treatment may vary due to age or medical condition  Airway Management Planned: Oral  ETT  Additional Equipment: Arterial line  Intra-op Plan:   Post-operative Plan: Extubation in OR  Informed Consent: I have reviewed the patients History and Physical, chart, labs and discussed the procedure including the risks, benefits and alternatives for the proposed anesthesia with the patient or authorized representative who has indicated his/her understanding and acceptance.     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 female presented 01/13/22 with anemia and R groin swelling. 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 Pt obtained ultrasound with compression of pseudoaneurysm and 1 unit of blood. Of note, pt was admitted 01/06/22-01/08/22 secondary to CVA with cerebral aneurysm s/p thrombectomy 10/25 apparently via right groin. Pt also was admitted in June 2023 with ischemic CVA with petechial hemorrhage. WNU:UVOZDGU, depression, TIA, cerebral aneurysm status post coiling, HTN, gastritis, dysphagia, esophageal web, GERD, partial colectomy, IBS, CVA)        Anesthesia Quick Evaluation

## 2022-01-15 NOTE — Plan of Care (Addendum)
Patient complaining of increased pain in right high. Bedside RN notified and PA up to see patient. Will hold d/c at this moment until cleared by Physician .

## 2022-01-15 NOTE — Progress Notes (Addendum)
  Progress Note    01/15/2022 7:51 AM * No surgery found *  Subjective:  no complaints. Wants to go home   Vitals:   01/14/22 2338 01/15/22 0412  BP: (!) 160/75 (!) 162/77  Pulse: 74 73  Resp: 16 15  Temp: 97.9 F (36.6 C) 97.6 F (36.4 C)  SpO2: 93% 95%   Physical Exam: Cardiac:  regular Lungs:  non labored Extremities:  right thigh ecchymosis. Right thigh hematoma stable. Palpable 2+ femoral pulse, 2+ DP pulse right foot. Well perfused and warm. Motor and sensation intact Abdomen:  soft, non distended Neurologic: alert and oriented  CBC    Component Value Date/Time   WBC 7.8 01/15/2022 0113   RBC 2.75 (L) 01/15/2022 0113   HGB 8.2 (L) 01/15/2022 0113   HCT 25.4 (L) 01/15/2022 0113   PLT 189 01/15/2022 0113   MCV 92.4 01/15/2022 0113   MCH 29.8 01/15/2022 0113   MCHC 32.3 01/15/2022 0113   RDW 18.8 (H) 01/15/2022 0113   LYMPHSABS 1.4 01/13/2022 1417   MONOABS 0.7 01/13/2022 1417   EOSABS 0.0 01/13/2022 1417   BASOSABS 0.0 01/13/2022 1417    BMET    Component Value Date/Time   NA 139 01/14/2022 0315   K 3.5 01/14/2022 0315   CL 106 01/14/2022 0315   CO2 25 01/14/2022 0315   GLUCOSE 111 (H) 01/14/2022 0315   GLUCOSE 94 01/26/2006 1026   BUN 11 01/14/2022 0315   CREATININE 0.54 01/14/2022 0315   CALCIUM 8.3 (L) 01/14/2022 0315   GFRNONAA >60 01/14/2022 0315   GFRAA >60 05/17/2018 0516    INR    Component Value Date/Time   INR 1.0 01/06/2022 0818     Intake/Output Summary (Last 24 hours) at 01/15/2022 0751 Last data filed at 01/14/2022 1956 Gross per 24 hour  Intake 951.32 ml  Output --  Net 951.32 ml     Assessment/Plan:  76 y.o. 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 Estanislado Pandy.  She underwent US guided compression of residual right femoral pseudo yesterday morning with successful thrombosis with no  residual flow.  Right groin stable hematoma. Ecchymosis present. Right lower extremity is well perfused with palpable pulses   Karoline Caldwell, PA-C [facility] [phone] 01/15/2022 7:51 AM  I have seen and evaluated the patient. I agree with the PA note as documented above.  The small residual right femoral pseudoaneurysm was successfully thrombosed yesterday with ultrasound compression at the bedside. Hemoglobin stable overnight.  Exam is stable as well.  I gave her the contact for our office.  Discussed any concerning signs and symptoms.  Marty Heck, MD [facility] [place] Office: [phone]

## 2022-01-15 NOTE — Discharge Instructions (Signed)
[facility]  Discharge instructions  Lower Extremity/groin Surgery  Please refer to the following instruction for your post-procedure care. Your surgeon or physician assistant will discuss any changes with you.  Activity  You are encouraged to walk as much as you can. You can slowly return to normal activities during the month after your surgery. Avoid strenuous activity and heavy lifting until your doctor tells you it's OK. Avoid activities such as vacuuming or swinging a golf club. Do not drive until your doctor give the OK and you are no longer taking prescription pain medications. It is also normal to have difficulty with sleep habits, eating and bowel movement after surgery. These will go away with time.  Bathing/Showering  Shower daily after you go home. Do not soak in a bathtub, hot tub, or swim until the incision heals completely.  Incision Care  Clean your incision with mild soap and water. Shower every day. Pat the area dry with a clean towel. You do not need a bandage unless otherwise instructed. Do not apply any ointments or creams to your incision. If you have open wounds you will be instructed how to care for them or a visiting nurse may be arranged for you. If you have staples or sutures along your incision they will be removed at your post-op appointment. You may have skin glue on your incision. Do not peel it off. It will come off on its own in about one week.  Wash the groin wound with soap and water daily and pat dry. (No tub bath-only shower)  Then put a dry gauze or washcloth in the groin to keep this area dry to help prevent wound infection.  Do this daily and as needed.  Do not use Vaseline or neosporin on your incisions.  Only use soap and water on your incisions and then protect and keep dry.  Diet  Resume your normal diet. There are no special food restrictions following this procedure. A low fat/ low cholesterol diet is  recommended for all patients with vascular disease. In order to heal from your surgery, it is CRITICAL to get adequate nutrition. Your body requires vitamins, minerals, and protein. Vegetables are the best source of vitamins and minerals. Vegetables also provide the perfect balance of protein. Processed food has little nutritional value, so try to avoid this.  Medications  Resume taking all your medications unless your doctor or physician assistant tells you not to. If your incision is causing pain, you may take over-the-counter pain relievers such as acetaminophen (Tylenol). If you were prescribed a stronger pain medication, please aware these medication can cause nausea and constipation. Prevent nausea by taking the medication with a snack or meal. Avoid constipation by drinking plenty of fluids and eating foods with high amount of fiber, such as fruits, vegetables, and grains. Take Colace 100 mg (an over-the-counter stool softener) twice a day as needed for constipation.  Do not take Tylenol if you are taking prescription pain medications.  Follow Up  Our office will schedule a follow up appointment 2-3 weeks following discharge.  Please call us immediately for any of the following conditions  Severe or worsening pain in your legs or feet while at rest or while walking Increase pain, redness, warmth, or drainage (pus) from your incision site(s) Fever of 101 degree or higher The swelling in your leg with the bypass suddenly worsens and becomes more painful than when you were in the hospital If you have been  instructed to feel your graft pulse then you should do so every day. If you can no longer feel this pulse, call the office immediately. Not all patients are given this instruction.  Leg swelling is common after leg bypass surgery.  The swelling should improve over a few months following surgery. To improve the swelling, you may elevate your legs above the level of your heart while you are  sitting or resting. Your surgeon or physician assistant may ask you to apply an ACE wrap or wear compression (TED) stockings to help to reduce swelling.  Reduce your risk of vascular disease  Stop smoking. If you would like help call [facility] at 1-800-QUIT-NOW 404-439-8215) or [facility] at (867)855-5782.  Manage your cholesterol Maintain a desired weight Control your diabetes weight Control your diabetes Keep your blood pressure down  If you have any questions, please call the office at [phone]

## 2022-01-15 NOTE — Progress Notes (Signed)
PHARMACIST LIPID MONITORING   Cassandra Alexander is a 76 y.o. female admitted on 01/13/2022 with CVA.  Pharmacy has been consulted to optimize lipid-lowering therapy with the indication of secondary prevention for clinical ASCVD.  Recent Labs:  Lipid Panel (last 6 months):   Lab Results  Component Value Date   CHOL 124 01/07/2022   TRIG 77 01/07/2022   HDL 62 01/07/2022   CHOLHDL 2.0 01/07/2022   VLDL 15 01/07/2022   LDLCALC 47 01/07/2022    Hepatic function panel (last 6 months):   Lab Results  Component Value Date   AST 15 01/14/2022   ALT 11 01/14/2022   ALKPHOS 47 01/14/2022   BILITOT 1.3 (H) 01/14/2022    SCr (since admission):   Serum creatinine: 0.54 mg/dL 01/14/22 0315 Estimated creatinine clearance: 44.3 mL/min  Current therapy and lipid therapy tolerance Current lipid-lowering therapy: atorvastatin 59m qday Previous lipid-lowering therapies (if applicable): N/a Documented or reported allergies or intolerances to lipid-lowering therapies (if applicable): n/a  Assessment:   Patient prefers no changes in lipid-lowering therapy at this time due to LDL already at goal  Plan:    1.Statin intensity (high intensity recommended for all patients regardless of the LDL):  No statin changes. The patient is already on a high intensity statin.  2.Add ezetimibe (if any one of the following):   Not indicated at this time.  3.Refer to lipid clinic:   No  4.Follow-up with:  Primary care provider - MAlroy Dust L.DMarlou Sa MD  5.Follow-up labs after discharge:  No changes in lipid therapy, repeat a lipid panel in one year.      MOnnie Boer PharmD, BCIDP, AAHIVP, CPP Infectious Disease Pharmacist 01/15/2022 8:24 PM

## 2022-01-15 NOTE — Progress Notes (Addendum)
Patient seen and examined at bedside status post new onset right groin pain.  Duplex ultrasound demonstrates graft in the proximal calf. New pseudoaneurysm at previous arteriotomy site. Patient is failed compression x2  I do not think further compression will be a durable option. After discussing the risk and benefits of open vascular repair, Cassandra Alexander elected to proceed.  She is aware that with the significant amount of vessel trauma and subsequent edema/hematoma present, both vessel health and wound healing are significant concerns.  Palpable pulses distally.   Broadus John MD

## 2022-01-15 NOTE — TOC Transition Note (Signed)
Transition of Care (TOC) - CM/SW Discharge Note Marvetta Gibbons RN, BSN Transitions of Care Unit 4E- RN Case Manager See Treatment Team for direct phone #   Patient Details  Name: LAYLI CAPSHAW MRN: [id number] Date of Birth: 18-Aug-1945  [facility]) CM/SW Contact:  Dawayne Patricia, RN Phone Number: 01/15/2022, 2:52 PM   Clinical Narrative:    Pt from home w/ spouse recent CVA and was to f/u with outpt neuro rehab- for PT/OT/SLP- per PT spouse reports that neuro rehab has told him pt will need new referral for outpt rehab needs-  New referral has been sent to Neuro rehab for PT/OT/SLP needs.   No further TOC needs noted.    Final next level of care: OP Rehab Barriers to Discharge: No Barriers Identified   Patient Goals and CMS Choice        Discharge Placement               Home        Discharge Plan and Services     Post Acute Care Choice: Resumption of Svcs/PTA Provider (new orders needed for outpt PT/OT/SLP neuro rehab)                               Social Determinants of Health (SDOH) Interventions     Readmission Risk Interventions    01/15/2022    2:52 PM  Readmission Risk Prevention Plan  Transportation Screening Complete  Home Care Screening Complete  Medication Review (RN CM) Complete

## 2022-01-15 NOTE — Progress Notes (Signed)
Right lower extremity pseudoaneurysm evaluation study completed.  Preliminary results relayed to Virl Cagey, MD.    See CV Proc for preliminary results report.   Darlin Coco, RDMS, RVT

## 2022-01-15 NOTE — Transfer of Care (Signed)
Immediate Anesthesia Transfer of Care Note  Patient: Cassandra Alexander  Procedure(s) Performed: FEMORAL ARTERY EXPLORATION PSEUDOANEURYSM REPAIR (Right) EVACUATION HEMATOMA (Right: Groin) RIGHT SUPERFICIAL FEMORAL ARTERY PRIMARY REPAIR (Right: Leg Upper) PLACEMENT OF JACKSON PRATT DRAIN (Right: Leg Upper)  Patient Location: PACU  Anesthesia Type:General  Level of Consciousness: awake and patient cooperative  Airway & Oxygen Therapy: Patient Spontanous Breathing  Post-op Assessment: Report given to RN and Post -op Vital signs reviewed and stable  Post vital signs: Reviewed and stable  Last Vitals:  Vitals Value Taken Time  BP 148/68 01/15/22 1821  Temp    Pulse 78 01/15/22 1825  Resp 16 01/15/22 1825  SpO2 96 % 01/15/22 1825  Vitals shown include unvalidated device data.  Last Pain:  Vitals:   01/15/22 1608  TempSrc:   PainSc: 5       Patients Stated Pain Goal: 0 (66/48/61 6122)  Complications: No notable events documented.

## 2022-01-15 NOTE — Evaluation (Addendum)
Physical Therapy Evaluation Patient Details Name: Cassandra Alexander MRN: [id number] DOB: [birth year] Today's Date: 01/15/2022  History of Present Illness  76 y.o. female presented 01/13/22 with anemia and R groin swelling. 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 Pt obtained ultrasound with compression of pseudoaneurysm and 1 unit of blood. Of note, pt was admitted 01/06/22-01/08/22 secondary to CVA with cerebral aneurysm s/p thrombectomy 10/25 apparently via right groin. Pt also was admitted in June 2023 with ischemic CVA with petechial hemorrhage. IRJ:JOACZYS, depression, TIA, cerebral aneurysm status post coiling, HTN, gastritis, dysphagia, esophageal web, GERD, partial colectomy, IBS, CVA    Clinical Impression  Pt presents with condition above and deficits mentioned below, see PT Problem List. Prior to her CVA 01/06/22, she was IND without DME. Since her CVA, she has been using a RW for mod I-SUP level mobility and has been needing assistance to navigate stairs. She lives with her husband in a multi-level house with 4 STE. Currently, pt demonstrates deficits in communication, cognition, strength, balance, coordination, and activity tolerance. She is limited further by feeling lightheaded and "woozy" with mobility, see BP measures below. RN and MD notified. She is able to mobilize at a min guard-supervision level using a RW with no LOB though. However, she is reliant on UE support for stability as she does stagger when trying to take steps without UE support. Recommending continued use of her RW for all OOB mobility, assistance from her husband at d/c, and follow-up with OP PT, OT, and SLP at a neuro clinic. Will continue to follow acutely.     BP:  125/70 sitting EOB prior to mobilizing 104/59 sitting after ambulating with pt reporting feeling "woozy"/lightheaded      Recommendations for follow up therapy are one component of a multi-disciplinary discharge planning process, led by the  attending physician.  Recommendations may be updated based on patient status, additional functional criteria and insurance authorization.  Follow Up Recommendations Outpatient PT (OP OT and SLP; neuro clinic)      Assistance Recommended at Discharge Intermittent Supervision/Assistance  Patient can return home with the following  A little help with bathing/dressing/bathroom;Assistance with cooking/housework;Direct supervision/assist for medications management;Direct supervision/assist for financial management;Assist for transportation;Help with stairs or ramp for entrance    Equipment Recommendations None recommended by PT  Recommendations for Other Services       Functional Status Assessment Patient has had a recent decline in their functional status and demonstrates the ability to make significant improvements in function in a reasonable and predictable amount of time.     Precautions / Restrictions Precautions Precautions: Fall Restrictions Weight Bearing Restrictions: No      Mobility  Bed Mobility Overal bed mobility: Needs Assistance Bed Mobility: Supine to Sit, Sit to Supine     Supine to sit: Supervision, HOB elevated Sit to supine: Supervision, HOB elevated   General bed mobility comments: Supervision for safety    Transfers Overall transfer level: Needs assistance Equipment used: Rolling walker (2 wheels) Transfers: Sit to/from Stand Sit to Stand: Min guard           General transfer comment: Min guard for safety, no LOB.    Ambulation/Gait Ambulation/Gait assistance: Min guard Gait Distance (Feet): 120 Feet Assistive device: Rolling walker (2 wheels), None Gait Pattern/deviations: Step-through pattern, Decreased stride length Gait velocity: reduced Gait velocity interpretation: <1.8 ft/sec, indicate of risk for recurrent falls   General Gait Details: Pt with slow, but mostly steady gait. Slows speed further when cued  to change head positions. Pt did  report feeling "woozy" as distance progressed with BP noted to decline, see General Comments below. No LOB when using RW, min guard for safety. Staggers when tries to take steps without UE support, needing minA to maintain balance.  Stairs            Wheelchair Mobility    Modified Rankin (Stroke Patients Only) Modified Rankin (Stroke Patients Only) Pre-Morbid Rankin Score: No symptoms Modified Rankin: Moderately severe disability     Balance Overall balance assessment: Needs assistance Sitting-balance support: No upper extremity supported, Feet supported Sitting balance-Leahy Scale: Good     Standing balance support: Bilateral upper extremity supported, No upper extremity supported, During functional activity Standing balance-Leahy Scale: Fair Standing balance comment: Able to stand without UE support, but benefits from RW to ambulate. Staggers when tries to take steps without UE support, needing minA to maintain balance.                             Pertinent Vitals/Pain Pain Assessment Pain Assessment: Faces Faces Pain Scale: Hurts even more Pain Location: R groin/thigh/knee Pain Descriptors / Indicators: Discomfort, Grimacing, Guarding Pain Intervention(s): Limited activity within patient's tolerance, Monitored during session, Repositioned    Home Living Family/patient expects to be discharged to:: Private residence Living Arrangements: Spouse/significant other Available Help at Discharge: Family;Available 24 hours/day Type of Home: House Home Access: Stairs to enter Entrance Stairs-Rails: Right Entrance Stairs-Number of Steps: 4 Alternate Level Stairs-Number of Steps: 15 Home Layout: Multi-level Home Equipment: Conservation officer, nature (2 wheels) Additional Comments: Husband works part time from home; can provide assist    Prior Function Prior Level of Function : Independent/Modified Independent;Driving             Mobility Comments: Prior to CVA  01/06/22, she was IND without DME. Recently, she has needed a RW for mobility and assistance on stairs. ADLs Comments: Ind with ADLs at baseline prior to CVA 01/06/22. Husband now assists with some ADLs and with finances/meds.     Hand Dominance   Dominant Hand: Right    Extremity/Trunk Assessment   Upper Extremity Assessment Upper Extremity Assessment: Overall WFL for tasks assessed    Lower Extremity Assessment Lower Extremity Assessment: RLE deficits/detail RLE Deficits / Details: edema and bruising noted around groin, thigh and knee; MMT scores of 4+ knee extension, 4+ ankle dorsiflexion; denies numbness/tingling    Cervical / Trunk Assessment Cervical / Trunk Assessment: Normal  Communication   Communication: Expressive difficulties  Cognition Arousal/Alertness: Awake/alert Behavior During Therapy: WFL for tasks assessed/performed Overall Cognitive Status: Impaired/Different from baseline Area of Impairment: Memory, Attention, Awareness, Safety/judgement                   Current Attention Level: Alternating Memory: Decreased short-term memory   Safety/Judgement: Decreased awareness of safety, Decreased awareness of deficits Awareness: Emergent   General Comments: Husband reports pt is still demonstrating some memory deficits along with slow processing. Pt with poor insight into need to tell therapist if she was dizzy to maintain her safety.        General Comments General comments (skin integrity, edema, etc.): BP 125/70 sitting EOB prior to mobilizing, 104/59 sitting after ambulating with pt reporting feeling "woozy"/lightheaded, 105/45 supine; educated pt and husband on edema management    Exercises     Assessment/Plan    PT Assessment Patient needs continued PT services  PT Problem List Decreased strength;Decreased balance;Decreased  mobility;Decreased cognition;Decreased safety awareness;Decreased activity tolerance;Cardiopulmonary status limiting  activity;Pain       PT Treatment Interventions DME instruction;Stair training;Gait training;Functional mobility training;Therapeutic activities;Therapeutic exercise;Balance training;Neuromuscular re-education;Patient/family education;Cognitive remediation    PT Goals (Current goals can be found in the Care Plan section)  Acute Rehab PT Goals Patient Stated Goal: to go home PT Goal Formulation: With patient/family Time For Goal Achievement: 01/29/22 Potential to Achieve Goals: Good    Frequency Min 3X/week     Co-evaluation               AM-PAC PT "6 Clicks" Mobility  Outcome Measure Help needed turning from your back to your side while in a flat bed without using bedrails?: A Little Help needed moving from lying on your back to sitting on the side of a flat bed without using bedrails?: A Little Help needed moving to and from a bed to a chair (including a wheelchair)?: A Little Help needed standing up from a chair using your arms (e.g., wheelchair or bedside chair)?: A Little Help needed to walk in hospital room?: A Little Help needed climbing 3-5 steps with a railing? : A Little 6 Click Score: 18    End of Session Equipment Utilized During Treatment: Gait belt Activity Tolerance: Patient tolerated treatment well;Other (comment) (limited by lightheadedness) Patient left: in bed;with call bell/phone within reach;with bed alarm set;with family/visitor present Nurse Communication: Mobility status;Other (comment) (BP) PT Visit Diagnosis: Other abnormalities of gait and mobility (R26.89);Other symptoms and signs involving the nervous system (R29.898);Unsteadiness on feet (R26.81);Difficulty in walking, not elsewhere classified (R26.2);Pain;Muscle weakness (generalized) (M62.81) Pain - Right/Left: Right Pain - part of body: Knee;Hip    Time: 4327-6147 PT Time Calculation (min) (ACUTE ONLY): 28 min   Charges:   PT Evaluation $PT Eval Moderate Complexity: 1 Mod PT  Treatments $Therapeutic Activity: 8-22 mins        Moishe Spice, PT, DPT Acute Rehabilitation Services  Office: [phone]   Orvan Falconer 01/15/2022, 9:32 AM

## 2022-01-16 DIAGNOSIS — I724 Aneurysm of artery of lower extremity: Secondary | ICD-10-CM | POA: Diagnosis not present

## 2022-01-16 LAB — BASIC METABOLIC PANEL
Anion gap: 6 (ref 5–15)
BUN: 6 mg/dL — ABNORMAL LOW (ref 8–23)
CO2: 25 mmol/L (ref 22–32)
Calcium: 8.4 mg/dL — ABNORMAL LOW (ref 8.9–10.3)
Chloride: 106 mmol/L (ref 98–111)
Creatinine, Ser: 0.48 mg/dL (ref 0.44–1.00)
GFR, Estimated: >60 mL/min (ref >60)
Glucose, Bld: 169 mg/dL — ABNORMAL HIGH (ref 70–99)
Potassium: 4 mmol/L (ref 3.5–5.1)
Sodium: 137 mmol/L (ref 135–145)

## 2022-01-16 LAB — CBC
HCT: 29.3 % — ABNORMAL LOW (ref 36.0–46.0)
Hemoglobin: 9.9 g/dL — ABNORMAL LOW (ref 12.0–15.0)
MCH: 30.3 pg (ref 26.0–34.0)
MCHC: 33.8 g/dL (ref 30.0–36.0)
MCV: 89.6 fL (ref 80.0–100.0)
Platelets: 208 10*3/uL (ref 150–400)
RBC: 3.27 MIL/uL — ABNORMAL LOW (ref 3.87–5.11)
RDW: 17.8 % — ABNORMAL HIGH (ref 11.5–15.5)
WBC: 7.1 10*3/uL (ref 4.0–10.5)
nRBC: 0 % (ref 0.0–0.2)

## 2022-01-16 NOTE — Progress Notes (Signed)
Mobility Specialist - Progress Note   01/16/22 1351  Mobility  Activity Ambulated with assistance in hallway  Level of Assistance Contact guard assist, steadying assist  Assistive Device Front wheel walker  Distance Ambulated (ft) 100 ft  Activity Response Tolerated well  $Mobility charge 1 Mobility    Pt received sitting EOB agreeable to mobility. Left in bed w/ call bell in reach and all needs met.   Paulla Dolly Mobility Specialist

## 2022-01-16 NOTE — Progress Notes (Signed)
PROGRESS NOTE    Cassandra Alexander  IRW:[id number] DOB: 1945/09/16 DOA: 01/13/2022 PCP: Alroy Dust, L.Marlou Sa, MD     Brief Narrative:  Cassandra Alexander is a 76 y.o. female with medical history significant of recent CVA with cerebral aneurysm s/p thrombectomy apparently via right groin and since then she has had pain and bruising on the right groin area.  She went for her regular follow-up was seen by neurology.  Patient was found to be anemic.  Her hemoglobin has apparently dropped to 8.1 from 12.2 last week.  She also has a large area of her right groin that appears swollen.  Patient was found to have pseudoaneurysm of the right groin.  Dr. Carlis Abbott vascular surgery was consulted.  He was able to do ultrasound with compression of the pseudoaneurysm.  Patient has improved.  She was transfused 1 unit of packed red blood cells in the ER.  She had improved and was supposed to discharge home 11/3.  However, later that afternoon, she had new onset right groin pain.  Duplex ultrasound demonstrated graft in the proximal calf, new pseudoaneurysm and failed compression.  She underwent hematoma evacuation, right superficial femoral artery primary repair by Dr. Virl Cagey 11/3.  New events last 24 hours / Subjective: States that she is feeling well overall.  Assessment & Plan:   Principal Problem:   Femoral artery pseudo-aneurysm, right (HCC) Active Problems:   S/P cerebral aneurysm operation   Benign essential hypertension   Anxiety and depression   Gastroesophageal reflux disease   Right femoral pseudoaneurysm -Status post hematoma evacuation, right superficial femoral artery primary repair 11/3 -Vascular surgery following   Acute blood loss anemia -Baseline hemoglobin 12.2 --> 8.1 -Status post 1 unit packed red blood cell transfusion 11/1 + 1 unit 11/3 -Hgb stable today    Hypertension -Lisinopril   Recent CVA -Status post mechanical thrombectomy via right transfemoral access -Aspirin and Plavix on  hold due to pseudoaneurysm -Lipitor    Anxiety/depression -Citalopram, imipramine   DVT prophylaxis:  heparin injection 5,000 Units Start: 01/15/22 2200 SCD's Start: 01/15/22 2013 SCDs Start: 01/13/22 2132  Code Status: Full code Family Communication: Husband at bedside Disposition Plan:  Status is: Inpatient Remains inpatient appropriate because: Continue close monitoring, possible discharge 11/6   Antimicrobials:  Anti-infectives (From admission, onward)    Start     Dose/Rate Route Frequency Ordered Stop   01/15/22 2130  vancomycin (VANCOCIN) IVPB 1000 mg/200 mL premix        1,000 mg 200 mL/hr over 60 Minutes Intravenous  Once 01/15/22 2021 01/15/22 2241   01/15/22 2100  vancomycin (VANCOCIN) IVPB 1000 mg/200 mL premix  Status:  Discontinued        1,000 mg 200 mL/hr over 60 Minutes Intravenous Every 12 hours 01/15/22 2012 01/15/22 2021   01/15/22 1730  ceFAZolin (ANCEF) IVPB 2g/100 mL premix        2 g 200 mL/hr over 30 Minutes Intravenous  Once 01/15/22 1607 01/15/22 1655   01/15/22 1611  ceFAZolin (ANCEF) 2-4 GM/100ML-% IVPB       Note to Pharmacy: Renda Rolls L: cabinet override      01/15/22 1611 01/15/22 1726        Objective: Vitals:   01/15/22 2322 01/16/22 0406 01/16/22 0747 01/16/22 0931  BP: 139/73 135/77 124/61   Pulse: 85 84 76   Resp: 14 18 18 20   Temp: 97.6 F (36.4 C) 97.9 F (36.6 C) 97.6 F (36.4 C)   TempSrc: Oral Oral Oral  SpO2: 94% 94% 92%   Weight:      Height:        Intake/Output Summary (Last 24 hours) at 01/16/2022 1259 Last data filed at 01/16/2022 0716 Gross per 24 hour  Intake 1802.13 ml  Output 1398 ml  Net 404.13 ml   Filed Weights   01/13/22 1324 01/14/22 0439  Weight: 49 kg 50.1 kg    Examination:  General exam: Appears calm and comfortable  Respiratory system: Clear to auscultation. Respiratory effort normal. No respiratory distress. No conversational dyspnea.  Cardiovascular system: S1 & S2 heard, RRR.  No murmurs. No pedal edema. Gastrointestinal system: Abdomen is nondistended, soft and nontender. Normal bowel sounds heard. Central nervous system: Alert and oriented. No focal neurological deficits. Speech clear.  Extremities: Right lower extremity in dressing, drain in place Psychiatry: Judgement and insight appear normal. Mood & affect appropriate.   Data Reviewed: I have personally reviewed following labs and imaging studies  CBC: Recent Labs  Lab 01/13/22 1417 01/14/22 0315 01/15/22 0113 01/15/22 1802 01/16/22 0126  WBC 7.6 8.6 7.8  --  7.1  NEUTROABS 5.4  --   --   --   --   HGB 8.1* 8.5* 8.2* 8.8* 9.9*  HCT 25.7* 26.6* 25.4* 26.0* 29.3*  MCV 97.7 92.4 92.4  --  89.6  PLT 235 188 189  --  048   Basic Metabolic Panel: Recent Labs  Lab 01/13/22 1417 01/14/22 0315 01/15/22 1802 01/16/22 0126  NA 143 139 135 137  K 3.5 3.5 3.9 4.0  CL 102 106  --  106  CO2 29 25  --  25  GLUCOSE 142* 111*  --  169*  BUN 11 11  --  6*  CREATININE 0.60 0.54  --  0.48  CALCIUM 9.3 8.3*  --  8.4*   GFR: Estimated Creatinine Clearance: 44.3 mL/min (by C-G formula based on SCr of 0.48 mg/dL). Liver Function Tests: Recent Labs  Lab 01/13/22 1417 01/14/22 0315  AST 20 15  ALT 14 11  ALKPHOS 52 47  BILITOT 0.8 1.3*  PROT 6.2* 5.4*  ALBUMIN 3.4* 3.0*   No results for input(s): "LIPASE", "AMYLASE" in the last 168 hours. No results for input(s): "AMMONIA" in the last 168 hours. Coagulation Profile: No results for input(s): "INR", "PROTIME" in the last 168 hours. Cardiac Enzymes: No results for input(s): "CKTOTAL", "CKMB", "CKMBINDEX", "TROPONINI" in the last 168 hours. BNP (last 3 results) No results for input(s): "PROBNP" in the last 8760 hours. HbA1C: No results for input(s): "HGBA1C" in the last 72 hours. CBG: No results for input(s): "GLUCAP" in the last 168 hours. Lipid Profile: No results for input(s): "CHOL", "HDL", "LDLCALC", "TRIG", "CHOLHDL", "LDLDIRECT" in the last  72 hours. Thyroid Function Tests: No results for input(s): "TSH", "T4TOTAL", "FREET4", "T3FREE", "THYROIDAB" in the last 72 hours. Anemia Panel: No results for input(s): "VITAMINB12", "FOLATE", "FERRITIN", "TIBC", "IRON", "RETICCTPCT" in the last 72 hours. Sepsis Labs: No results for input(s): "PROCALCITON", "LATICACIDVEN" in the last 168 hours.  Recent Results (from the past 240 hour(s))  Surgical pcr screen     Status: None   Collection Time: 01/15/22  4:13 PM   Specimen: Nasal Mucosa; Nasal Swab  Result Value Ref Range Status   MRSA, PCR NEGATIVE NEGATIVE Final   Staphylococcus aureus NEGATIVE NEGATIVE Final    Comment: (NOTE) The Xpert SA Assay (FDA approved for NASAL specimens in patients 19 years of age and older), is one component of a comprehensive surveillance program. It  is not intended to diagnose infection nor to guide or monitor treatment. Performed at [facility], [place] [address], [place], [place] [address]       Radiology Studies: VAS [place] GROIN PSEUDOANEURYSM  Result Date: 01/15/2022  ARTERIAL PSEUDOANEURYSM  Patient Name:  NABRIA NEVIN  Date of Exam:   01/15/2022 Medical Rec #: [id number]      Accession #:    [id number] Date of Birth: [birth year]      Patient Gender: F Patient Age:   73 years Exam Location:  [facility] Procedure:      VAS [place] GROIN PSEUDOANEURYSM Referring Phys: Leontine Locket --------------------------------------------------------------------------------  Exam: Right groin Indications: Patient complains of sudden onset of increased pain with ambulation s/p pseudoaneurysm compression. Bruising and palpable knot. History: S/p catheterization. Comparison Study: 01-14-2022 Pseudoaneurysm evaluation with compression showed                   no residual flow within the previously observed residual lumen                   of the pseudoaneurysm after 15 minutes of compression.                    01-13-2022 Pseudoaneurysm compression showed a  subcentimeter                   residual lumen after 35 minutes of compression. Performing Technologist: Darlin Coco RDMS, RVT  Examination Guidelines: A complete evaluation includes B-mode imaging, spectral Doppler, color Doppler, and power Doppler as needed of all accessible portions of each vessel. Bilateral testing is considered an integral part of a complete examination. Limited examinations for reoccurring indications may be performed as noted. +------------+----------+---------+------+----------+ Right DuplexPSV (cm/s)Waveform PlaqueComment(s) +------------+----------+---------+------+----------+ CFA            148    triphasic                 +------------+----------+---------+------+----------+ PFA            155    triphasic                 +------------+----------+---------+------+----------+ Prox SFA       212    triphasic                 +------------+----------+---------+------+----------+  Findings: An area with well defined borders was visualized with ultrasound characteristics of a pseudoaneurysm. Mixed echos within the structure suggest that it is partially thrombosed; however, evidence of recanalization of previously compressed pseudoaneurysm with a residual lumen measuring >1cm was visualized on today's examination.     --------------------------------------------------------------------------------    Preliminary       Scheduled Meds:  atorvastatin  80 mg Oral Daily   citalopram  10 mg Oral QHS   docusate sodium  100 mg Oral Daily   heparin  5,000 Units Subcutaneous Q8H   imipramine  100 mg Oral QHS   lisinopril  10 mg Oral Daily   mesalamine  2.4 g Oral Q breakfast   pantoprazole  40 mg Oral Daily   Continuous Infusions:  sodium chloride     sodium chloride     sodium chloride Stopped (01/16/22 0623)   magnesium sulfate bolus IVPB       LOS: 2 days     Dessa Phi, DO [facility] 01/16/2022, 12:59 PM   Available via Epic secure chat  7am-7pm After these hours, please refer to coverage provider listed  on [email or web address]

## 2022-01-16 NOTE — Anesthesia Postprocedure Evaluation (Signed)
Anesthesia Post Note  Patient: Cassandra Alexander  Procedure(s) Performed: FEMORAL ARTERY EXPLORATION PSEUDOANEURYSM REPAIR (Right) EVACUATION HEMATOMA (Right: Groin) RIGHT SUPERFICIAL FEMORAL ARTERY PRIMARY REPAIR (Right: Leg Upper) PLACEMENT OF JACKSON PRATT DRAIN (Right: Leg Upper)     Patient location during evaluation: PACU Anesthesia Type: General Level of consciousness: awake and alert Pain management: pain level controlled Vital Signs Assessment: post-procedure vital signs reviewed and stable Respiratory status: spontaneous breathing, nonlabored ventilation, respiratory function stable and patient connected to nasal cannula oxygen Cardiovascular status: blood pressure returned to baseline and stable Postop Assessment: no apparent nausea or vomiting Anesthetic complications: no   No notable events documented.        Effie Berkshire

## 2022-01-16 NOTE — Progress Notes (Addendum)
  Progress Note    01/16/2022 7:14 AM 1 Day Post-Op  Subjective:  says she feels much better  Afebrile   Vitals:   01/15/22 2322 01/16/22 0406  BP: 139/73 135/77  Pulse: 85 84  Resp: 14 18  Temp: 97.6 F (36.4 C) 97.9 F (36.6 C)  SpO2: 94% 94%    Physical Exam: Cardiac:  regular Lungs:  non labored Incisions:  right groin is clean; extensive ecchymosis present Extremities:  palpable right DP pulse   CBC    Component Value Date/Time   WBC 7.1 01/16/2022 0126   RBC 3.27 (L) 01/16/2022 0126   HGB 9.9 (L) 01/16/2022 0126   HCT 29.3 (L) 01/16/2022 0126   PLT 208 01/16/2022 0126   MCV 89.6 01/16/2022 0126   MCH 30.3 01/16/2022 0126   MCHC 33.8 01/16/2022 0126   RDW 17.8 (H) 01/16/2022 0126   LYMPHSABS 1.4 01/13/2022 1417   MONOABS 0.7 01/13/2022 1417   EOSABS 0.0 01/13/2022 1417   BASOSABS 0.0 01/13/2022 1417    BMET    Component Value Date/Time   NA 137 01/16/2022 0126   K 4.0 01/16/2022 0126   CL 106 01/16/2022 0126   CO2 25 01/16/2022 0126   GLUCOSE 169 (H) 01/16/2022 0126   GLUCOSE 94 01/26/2006 1026   BUN 6 (L) 01/16/2022 0126   CREATININE 0.48 01/16/2022 0126   CALCIUM 8.4 (L) 01/16/2022 0126   GFRNONAA >60 01/16/2022 0126   GFRAA >60 05/17/2018 0516    INR    Component Value Date/Time   INR 1.0 01/06/2022 0818     Intake/Output Summary (Last 24 hours) at 01/16/2022 0714 Last data filed at 01/16/2022 0417 Gross per 24 hour  Intake 1263.83 ml  Output 1398 ml  Net -134.17 ml     Assessment/Plan:  76 y.o. female is s/p:  Right groin exploration Hematoma evacuation Right superficial femoral artery primary repair Drain placement  1 Day Post-Op   -pt feeling much better this morning and has palpable right DP pulse -JP drain with 25cc/since surgery.  Will remove drain tomorrow if output remains low.   -acute blood loss anemia-received 2 units PRBCs and hgb improved -DVT prophylaxis:  sq heparin -mobilize out of bed today.     Leontine Locket, PA-C [facility] [phone] 01/16/2022 7:14 AM  VASCULAR STAFF ADDENDUM: I have independently interviewed and examined the patient. I agree with the above.  2+ DP, groin soft. Drain with 82m Ambulate, pull drain tomorrow home tomorrow v Monday  JCassandria Santee MD [facility]Phone Number: ([phone]/06/2021 10:36 AM

## 2022-01-16 NOTE — Progress Notes (Addendum)
Physical Therapy Treatment Patient Details Name: Cassandra Alexander MRN: [id number] DOB: [birth year] Today's Date: 01/16/2022   History of Present Illness 76 y.o. female presented 01/13/22 with anemia and R groin swelling. 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 She underwent US guided compression of residual right superficial femoral artery pseudoaneurysm yesterday, give 1 unit of blood. Prior to discharge, she noted intense pain in the right leg with new ultrasound demonstrating reformation of the pseudoaneurysm. Patient now s/p Rt groin exploration with hematoma evacuation, Rt superficial femoral artery repair, and drain placement. Of note, pt was admitted 01/06/22-01/08/22 secondary to CVA with cerebral aneurysm s/p thrombectomy 10/25 apparently via right groin. Pt also was admitted in June 2023 with ischemic CVA with petechial hemorrhage. GYJ:[id number], depression, TIA, cerebral aneurysm status post coiling, HTN, gastritis, dysphagia, esophageal web, GERD, partial colectomy, IBS, CVA.    PT Comments    Patient resting in bed, bed rest order complete and eager to mobilize. Pt reports feeling much better at start and able to go to bathroom earlier. Pt making steady progress with mobility and was able to move to EOB and sit<>stand with min guard/supervision for safety. Pt ambulated ~150' with RW and reported increased Rt LE pain with activity and gait slightly more antalgic over distance. VSS throughout and pt denied dizziness with activity. Will continue to progress pt as able. Recommend [place] services at discharge with transition to OP neuro clinic. Will continue to progress as able.    Recommendations for follow up therapy are one component of a multi-disciplinary discharge planning process, led by the attending physician.  Recommendations may be updated based on patient status, additional functional criteria and insurance authorization.  Follow Up Recommendations  Home health PT (transition to OP  PT/OT/SLP; neuro clinic) - [place] [facility] is closer to home per pt/spouse if they have all 3 disciplines     Assistance Recommended at Discharge Intermittent Supervision/Assistance  Patient can return home with the following A little help with bathing/dressing/bathroom;Assistance with cooking/housework;Direct supervision/assist for medications management;Direct supervision/assist for financial management;Assist for transportation;Help with stairs or ramp for entrance   Equipment Recommendations  None recommended by PT    Recommendations for Other Services       Precautions / Restrictions Precautions Precautions: Fall Restrictions Weight Bearing Restrictions: No     Mobility  Bed Mobility Overal bed mobility: Needs Assistance Bed Mobility: Supine to Sit, Sit to Supine     Supine to sit: Supervision, HOB elevated Sit to supine: Supervision, HOB elevated   General bed mobility comments: Supervision for safety; extra time    Transfers Overall transfer level: Needs assistance Equipment used: Rolling walker (2 wheels) Transfers: Sit to/from Stand Sit to Stand: Min guard           General transfer comment: Min guard for safety, cues for hand placement to rise and pt steady with use of RW.    Ambulation/Gait Ambulation/Gait assistance: Min guard Gait Distance (Feet): 150 Feet Assistive device: Rolling walker (2 wheels) Gait Pattern/deviations: Step-through pattern, Decreased stride length, Antalgic Gait velocity: decr     General Gait Details: pt slow and cautious with pace. no overt LOB noted throughout. pt required min guard for safety and cues to maintain safe position to RW at start. denied dizziness and VSS. Pt required cues to grade distance of ambulation as gait became more antalgic due to Rt LE pain.   Stairs             Emergency planning/management officer  Modified Rankin (Stroke Patients Only) Modified Rankin (Stroke Patients Only) Pre-Morbid Rankin Score:  No symptoms Modified Rankin: Moderately severe disability     Balance Overall balance assessment: Needs assistance Sitting-balance support: No upper extremity supported, Feet supported Sitting balance-Leahy Scale: Good     Standing balance support: Bilateral upper extremity supported, During functional activity Standing balance-Leahy Scale: Fair                              Cognition Arousal/Alertness: Awake/alert Behavior During Therapy: WFL for tasks assessed/performed Overall Cognitive Status: Impaired/Different from baseline Area of Impairment: Memory, Attention, Awareness                   Current Attention Level: Alternating Memory: Decreased short-term memory     Awareness: Emergent   General Comments: Husband reports pt is still demonstrating some memory deficits along with slow processing but has improved since yesterday.        Exercises      General Comments General comments (skin integrity, edema, etc.): VSS on RA      Pertinent Vitals/Pain Pain Assessment Pain Assessment: Faces Faces Pain Scale: Hurts even more Pain Location: R groin/thigh/knee Pain Descriptors / Indicators: Discomfort, Grimacing, Guarding Pain Intervention(s): Limited activity within patient's tolerance, Monitored during session, Repositioned, RN gave pain meds during session    [place] expects to be discharged to:: Private residence Living Arrangements: Spouse/significant other Available Help at Discharge: Family;Available 24 hours/day Type of Home: House Home Access: Stairs to enter Entrance Stairs-Rails: Right Entrance Stairs-Number of Steps: 4 Alternate Level Stairs-Number of Steps: 15 Home Layout: Two level Home Equipment: Conservation officer, nature (2 wheels);Shower seat Additional Comments: Husband works part time from home; can provide assist    Prior Function            PT Goals (current goals can now be found in the care plan section)  Acute Rehab PT Goals Patient Stated Goal: to go home PT Goal Formulation: With patient/family Time For Goal Achievement: 01/29/22 Potential to Achieve Goals: Good Progress towards PT goals: Progressing toward goals    Frequency    Min 3X/week      PT Plan Current plan remains appropriate    Co-evaluation              AM-PAC PT "6 Clicks" Mobility   Outcome Measure  Help needed turning from your back to your side while in a flat bed without using bedrails?: A Little Help needed moving from lying on your back to sitting on the side of a flat bed without using bedrails?: A Little Help needed moving to and from a bed to a chair (including a wheelchair)?: A Little Help needed standing up from a chair using your arms (e.g., wheelchair or bedside chair)?: A Little Help needed to walk in hospital room?: A Little Help needed climbing 3-5 steps with a railing? : A Little 6 Click Score: 18    End of Session Equipment Utilized During Treatment: Gait belt Activity Tolerance: Patient tolerated treatment well;Other (comment) (limited by lightheadedness) Patient left: in bed;with call bell/phone within reach;with bed alarm set;with family/visitor present Nurse Communication: Mobility status;Other (comment) (BP) PT Visit Diagnosis: Other abnormalities of gait and mobility (R26.89);Other symptoms and signs involving the nervous system (R29.898);Unsteadiness on feet (R26.81);Difficulty in walking, not elsewhere classified (R26.2);Pain;Muscle weakness (generalized) (M62.81) Pain - Right/Left: Right Pain - part of body: Knee;Hip     Time: 7001-7494 PT  Time Calculation (min) (ACUTE ONLY): 25 min  Charges:  $Gait Training: 8-22 mins $Therapeutic Activity: 8-22 mins                     Verner Mould, DPT Acute Rehabilitation Services Office [phone]  01/16/22 1:32 PM

## 2022-01-16 NOTE — Evaluation (Addendum)
Occupational Therapy Evaluation Patient Details Name: Cassandra Alexander MRN: [id number] DOB: 1945/06/17 Today's Date: 01/16/2022   History of Present Illness 76 y.o. female presented 01/13/22 with anemia and R groin swelling. 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 Pt obtained ultrasound with compression of pseudoaneurysm and 1 unit of blood. Of note, pt was admitted 01/06/22-01/08/22 secondary to CVA with cerebral aneurysm s/p thrombectomy 10/25 apparently via right groin. Pt also was admitted in June 2023 with ischemic CVA with petechial hemorrhage. NLZ:[id number], depression, TIA, cerebral aneurysm status post coiling, HTN, gastritis, dysphagia, esophageal web, GERD, partial colectomy, IBS, CVA   Clinical Impression   Pt reports needing assist at baseline from spouse for ADLs and functional mobility. Pt currently with decreased activity tolerance, needing set up -mod A for ADLs, supervision for bed mobility, and min guard for transfers with RW. Pt with decreased cognition, and slow processing, needing cues for safety. Pt presenting with impairments listed below, will follow acutely. Recommend HHOT at d/c.      Recommendations for follow up therapy are one component of a multi-disciplinary discharge planning process, led by the attending physician.  Recommendations may be updated based on patient status, additional functional criteria and insurance authorization.   Follow Up Recommendations  Home Health OT (transition to OT/PT/SLP outpatient neuro)   Assistance Recommended at Discharge Intermittent Supervision/Assistance  Patient can return home with the following A little help with walking and/or transfers;A little help with bathing/dressing/bathroom    Functional Status Assessment  Patient has had a recent decline in their functional status and demonstrates the ability to make significant improvements in function in a reasonable and predictable amount of time.  Equipment Recommendations   None recommended by OT    Recommendations for Other Services PT consult     Precautions / Restrictions Precautions Precautions: Fall Restrictions Weight Bearing Restrictions: No      Mobility Bed Mobility Overal bed mobility: Needs Assistance Bed Mobility: Supine to Sit, Sit to Supine     Supine to sit: Supervision Sit to supine: Supervision        Transfers Overall transfer level: Needs assistance Equipment used: Rolling walker (2 wheels) Transfers: Sit to/from Stand Sit to Stand: Min guard           General transfer comment: Min guard for safety, no LOB.      Balance Overall balance assessment: Needs assistance Sitting-balance support: No upper extremity supported, Feet supported Sitting balance-Leahy Scale: Good     Standing balance support: Bilateral upper extremity supported, No upper extremity supported, During functional activity Standing balance-Leahy Scale: Fair Standing balance comment: Able to stand without UE support, but benefits from RW to ambulate. Staggers when tries to take steps without UE support, needing minA to maintain balance.                           ADL either performed or assessed with clinical judgement   ADL Overall ADL's : Needs assistance/impaired Eating/Feeding: Set up;Sitting   Grooming: Set up;Sitting;Standing   Upper Body Bathing: Minimal assistance;Sitting   Lower Body Bathing: Moderate assistance;Sitting/lateral leans   Upper Body Dressing : Minimal assistance;Sitting   Lower Body Dressing: Moderate assistance   Toilet Transfer: Minimal Best boy- Clothing Manipulation and Hygiene: Minimal assistance       Functional mobility during ADLs: Minimal assistance       Vision Baseline Vision/History: 1 Wears glasses Vision Assessment?: No apparent visual deficits  Perception     Praxis      Pertinent Vitals/Pain Pain Assessment Pain Assessment: Faces Pain Score:  3  Faces Pain Scale: Hurts little more Pain Location: R groin/thigh/knee Pain Descriptors / Indicators: Discomfort, Grimacing, Guarding Pain Intervention(s): Limited activity within patient's tolerance, Monitored during session, Repositioned     Hand Dominance Right   Extremity/Trunk Assessment Upper Extremity Assessment Upper Extremity Assessment: Overall WFL for tasks assessed   Lower Extremity Assessment Lower Extremity Assessment: Defer to PT evaluation   Cervical / Trunk Assessment Cervical / Trunk Assessment: Normal   Communication Communication Communication: Expressive difficulties (repetitive, cues for redirection)   Cognition Arousal/Alertness: Awake/alert Behavior During Therapy: WFL for tasks assessed/performed Overall Cognitive Status: Impaired/Different from baseline Area of Impairment: Memory, Attention, Awareness, Safety/judgement                   Current Attention Level: Alternating Memory: Decreased short-term memory   Safety/Judgement: Decreased awareness of safety, Decreased awareness of deficits Awareness: Emergent   General Comments: Husband reports pt is still demonstrating some memory deficits along with slow processing. Pt with poor insight into need to tell therapist if she was dizzy to maintain her safety.     General Comments  VSS on RA    Exercises     Shoulder Instructions      Home Living Family/patient expects to be discharged to:: Private residence Living Arrangements: Spouse/significant other Available Help at Discharge: Family;Available 24 hours/day Type of Home: House Home Access: Stairs to enter [employer] of Steps: 4 Entrance Stairs-Rails: Right Home Layout: Two level Alternate Level Stairs-Number of Steps: 15 Alternate Level Stairs-Rails: Left;Right Bathroom Shower/Tub: Teacher, early years/pre: Standard     Home Equipment: Conservation officer, nature (2 wheels);Shower seat   Additional Comments:  Husband works part time from home; can provide assist  Lives With: Spouse    Prior Functioning/Environment Prior Level of Function : Independent/Modified Independent;Driving             Mobility Comments: Prior to CVA 01/06/22, she was IND without DME. Recently, she has needed a RW for mobility and assistance on stairs. ADLs Comments: Ind with ADLs at baseline prior to CVA 01/06/22. Husband now assists with some ADLs and with finances/meds.        OT Problem List: Decreased strength;Decreased range of motion;Impaired balance (sitting and/or standing);Decreased cognition;Decreased safety awareness      OT Treatment/Interventions: Self-care/ADL training;Therapeutic exercise;Energy conservation;DME and/or AE instruction;Therapeutic activities;Patient/family education;Balance training    OT Goals(Current goals can be found in the care plan section) Acute Rehab OT Goals Patient Stated Goal: none stated OT Goal Formulation: With patient Time For Goal Achievement: 01/30/22 Potential to Achieve Goals: Good ADL Goals Pt Will Perform Upper Body Dressing: with modified independence;standing;sitting Pt Will Perform Lower Body Dressing: with modified independence;sitting/lateral leans;sit to/from stand Pt Will Transfer to Toilet: with modified independence;ambulating;regular height toilet  OT Frequency: Min 2X/week    Co-evaluation              AM-PAC OT "6 Clicks" Daily Activity     Outcome Measure Help from another person eating meals?: None Help from another person taking care of personal grooming?: A Little Help from another person toileting, which includes using toliet, bedpan, or urinal?: A Little Help from another person bathing (including washing, rinsing, drying)?: A Lot Help from another person to put on and taking off regular upper body clothing?: A Little Help from another person to put on and taking off  regular lower body clothing?: A Lot 6 Click Score: 17   End of  Session Equipment Utilized During Treatment: Gait belt;Rolling walker (2 wheels) Nurse Communication: Mobility status  Activity Tolerance: Patient tolerated treatment well Patient left: in bed;with call bell/phone within reach;with bed alarm set  OT Visit Diagnosis: Unsteadiness on feet (R26.81)                Time: 1100-1117 OT Time Calculation (min): 17 min Charges:  OT General Charges $OT Visit: 1 Visit OT Evaluation $OT Eval Moderate Complexity: 1 Mod  Sanaz Scarlett, OTD, OTR/L Acute Rehab [phone] [place] 01/16/2022, 11:49 AM

## 2022-01-17 ENCOUNTER — Encounter (HOSPITAL_COMMUNITY): Payer: Self-pay | Admitting: Vascular Surgery

## 2022-01-17 DIAGNOSIS — I724 Aneurysm of artery of lower extremity: Secondary | ICD-10-CM | POA: Diagnosis not present

## 2022-01-17 LAB — TYPE AND SCREEN
ABO/RH(D): A POS
Antibody Screen: NEGATIVE
Unit division: 0
Unit division: 0
Unit division: 0

## 2022-01-17 LAB — BPAM RBC
Blood Product Expiration Date: 202311232359
Blood Product Expiration Date: 202311232359
Blood Product Expiration Date: 202311232359
ISSUE DATE / TIME: 202311011646
ISSUE DATE / TIME: 202311031625
ISSUE DATE / TIME: 202311031625
Unit Type and Rh: 6200
Unit Type and Rh: 6200
Unit Type and Rh: 6200

## 2022-01-17 LAB — CBC
HCT: 25.9 % — ABNORMAL LOW (ref 36.0–46.0)
Hemoglobin: 8.6 g/dL — ABNORMAL LOW (ref 12.0–15.0)
MCH: 30.6 pg (ref 26.0–34.0)
MCHC: 33.2 g/dL (ref 30.0–36.0)
MCV: 92.2 fL (ref 80.0–100.0)
Platelets: 218 10*3/uL (ref 150–400)
RBC: 2.81 MIL/uL — ABNORMAL LOW (ref 3.87–5.11)
RDW: 18.3 % — ABNORMAL HIGH (ref 11.5–15.5)
WBC: 12.8 10*3/uL — ABNORMAL HIGH (ref 4.0–10.5)
nRBC: 0 % (ref 0.0–0.2)

## 2022-01-17 NOTE — Progress Notes (Signed)
PROGRESS NOTE    Cassandra Alexander  TSV:[id number] DOB: 1945-12-29 DOA: 01/13/2022 PCP: Alroy Dust, L.Marlou Sa, MD     Brief Narrative:  Cassandra Alexander is a 76 y.o. female with medical history significant of recent CVA with cerebral aneurysm s/p thrombectomy apparently via right groin and since then she has had pain and bruising on the right groin area.  She went for her regular follow-up was seen by neurology.  Patient was found to be anemic.  Her hemoglobin has apparently dropped to 8.1 from 12.2 last week.  She also has a large area of her right groin that appears swollen.  Patient was found to have pseudoaneurysm of the right groin.  Dr. Carlis Abbott vascular surgery was consulted.  He was able to do ultrasound with compression of the pseudoaneurysm.  Patient has improved.  She was transfused 1 unit of packed red blood cells in the ER.  She had improved and was supposed to discharge home 11/3.  However, later that afternoon, she had new onset right groin pain.  Duplex ultrasound demonstrated graft in the proximal calf, new pseudoaneurysm and failed compression.  She underwent hematoma evacuation, right superficial femoral artery primary repair by Dr. Virl Cagey 11/3.  New events last 24 hours / Subjective: Doing well, sleep really well last night.  Pain under control.  Afebrile overnight.  WBC trended upward 12.8, could be reactive but will monitor  Assessment & Plan:   Principal Problem:   Femoral artery pseudo-aneurysm, right (HCC) Active Problems:   S/P cerebral aneurysm operation   Benign essential hypertension   Anxiety and depression   Gastroesophageal reflux disease   Right femoral pseudoaneurysm -Status post hematoma evacuation, right superficial femoral artery primary repair 11/3 -Vascular surgery following   Acute blood loss anemia -Baseline hemoglobin 12.2 --> 8.1 -Status post 1 unit packed red blood cell transfusion 11/1 + 1 unit 11/3 -Hgb stable today    Hypertension -Lisinopril    Recent CVA -Status post mechanical thrombectomy via right transfemoral access -Aspirin and Plavix on hold due to pseudoaneurysm -Lipitor    Anxiety/depression -Citalopram, imipramine   DVT prophylaxis:  heparin injection 5,000 Units Start: 01/15/22 2200 SCD's Start: 01/15/22 2013 SCDs Start: 01/13/22 2132  Code Status: Full code Family Communication: No family at bedside Disposition Plan:  Status is: Inpatient Remains inpatient appropriate because: Continue close monitoring, possible discharge 11/6 if WBC normalized and no other issues from vascular surgery standpoint   Antimicrobials:  Anti-infectives (From admission, onward)    Start     Dose/Rate Route Frequency Ordered Stop   01/15/22 2130  vancomycin (VANCOCIN) IVPB 1000 mg/200 mL premix        1,000 mg 200 mL/hr over 60 Minutes Intravenous  Once 01/15/22 2021 01/15/22 2241   01/15/22 2100  vancomycin (VANCOCIN) IVPB 1000 mg/200 mL premix  Status:  Discontinued        1,000 mg 200 mL/hr over 60 Minutes Intravenous Every 12 hours 01/15/22 2012 01/15/22 2021   01/15/22 1730  ceFAZolin (ANCEF) IVPB 2g/100 mL premix        2 g 200 mL/hr over 30 Minutes Intravenous  Once 01/15/22 1607 01/15/22 1655   01/15/22 1611  ceFAZolin (ANCEF) 2-4 GM/100ML-% IVPB       Note to Pharmacy: Renda Rolls L: cabinet override      01/15/22 1611 01/15/22 1726        Objective: Vitals:   01/17/22 0017 01/17/22 0350 01/17/22 0907 01/17/22 1304  BP: 128/62 (!) 120/59 139/77 127/66  Pulse: 75 82 76 77  Resp: 19 14 20 16   Temp: 98.4 F (36.9 C) 98.6 F (37 C) 98.4 F (36.9 C) 98.5 F (36.9 C)  TempSrc: Oral Oral Oral Oral  SpO2: 93% 99% 99%   Weight:      Height:        Intake/Output Summary (Last 24 hours) at 01/17/2022 1318 Last data filed at 01/17/2022 0908 Gross per 24 hour  Intake 120 ml  Output 50 ml  Net 70 ml    Filed Weights   01/13/22 1324 01/14/22 0439  Weight: 49 kg 50.1 kg    Examination:  General  exam: Appears calm and comfortable  Respiratory system: Clear to auscultation. Respiratory effort normal. No respiratory distress. No conversational dyspnea.  Cardiovascular system: S1 & S2 heard, RRR. No murmurs. No pedal edema. Gastrointestinal system: Abdomen is nondistended, soft and nontender. Normal bowel sounds heard. Central nervous system: Alert and oriented. No focal neurological deficits. Speech clear.  Extremities: Right lower extremity in dressing, drain in place Psychiatry: Judgement and insight appear normal. Mood & affect appropriate.   Data Reviewed: I have personally reviewed following labs and imaging studies  CBC: Recent Labs  Lab 01/13/22 1417 01/14/22 0315 01/15/22 0113 01/15/22 1802 01/16/22 0126 01/17/22 0129  WBC 7.6 8.6 7.8  --  7.1 12.8*  NEUTROABS 5.4  --   --   --   --   --   HGB 8.1* 8.5* 8.2* 8.8* 9.9* 8.6*  HCT 25.7* 26.6* 25.4* 26.0* 29.3* 25.9*  MCV 97.7 92.4 92.4  --  89.6 92.2  PLT 235 188 189  --  208 001    Basic Metabolic Panel: Recent Labs  Lab 01/13/22 1417 01/14/22 0315 01/15/22 1802 01/16/22 0126  NA 143 139 135 137  K 3.5 3.5 3.9 4.0  CL 102 106  --  106  CO2 29 25  --  25  GLUCOSE 142* 111*  --  169*  BUN 11 11  --  6*  CREATININE 0.60 0.54  --  0.48  CALCIUM 9.3 8.3*  --  8.4*    GFR: Estimated Creatinine Clearance: 44.3 mL/min (by C-G formula based on SCr of 0.48 mg/dL). Liver Function Tests: Recent Labs  Lab 01/13/22 1417 01/14/22 0315  AST 20 15  ALT 14 11  ALKPHOS 52 47  BILITOT 0.8 1.3*  PROT 6.2* 5.4*  ALBUMIN 3.4* 3.0*    No results for input(s): "LIPASE", "AMYLASE" in the last 168 hours. No results for input(s): "AMMONIA" in the last 168 hours. Coagulation Profile: No results for input(s): "INR", "PROTIME" in the last 168 hours. Cardiac Enzymes: No results for input(s): "CKTOTAL", "CKMB", "CKMBINDEX", "TROPONINI" in the last 168 hours. BNP (last 3 results) No results for input(s): "PROBNP" in the  last 8760 hours. HbA1C: No results for input(s): "HGBA1C" in the last 72 hours. CBG: No results for input(s): "GLUCAP" in the last 168 hours. Lipid Profile: No results for input(s): "CHOL", "HDL", "LDLCALC", "TRIG", "CHOLHDL", "LDLDIRECT" in the last 72 hours. Thyroid Function Tests: No results for input(s): "TSH", "T4TOTAL", "FREET4", "T3FREE", "THYROIDAB" in the last 72 hours. Anemia Panel: No results for input(s): "VITAMINB12", "FOLATE", "FERRITIN", "TIBC", "IRON", "RETICCTPCT" in the last 72 hours. Sepsis Labs: No results for input(s): "PROCALCITON", "LATICACIDVEN" in the last 168 hours.  Recent Results (from the past 240 hour(s))  Surgical pcr screen     Status: None   Collection Time: 01/15/22  4:13 PM   Specimen: Nasal Mucosa; Nasal Swab  Result Value Ref Range Status   MRSA, PCR NEGATIVE NEGATIVE Final   Staphylococcus aureus NEGATIVE NEGATIVE Final    Comment: (NOTE) The Xpert SA Assay (FDA approved for NASAL specimens in patients 55 years of age and older), is one component of a comprehensive surveillance program. It is not intended to diagnose infection nor to guide or monitor treatment. Performed at [facility], [place] [address], [place], [place] [address]       Radiology Studies: VAS [place] GROIN PSEUDOANEURYSM  Result Date: 01/16/2022  ARTERIAL PSEUDOANEURYSM  Patient Name:  Cassandra Alexander  Date of Exam:   01/15/2022 Medical Rec #: [id number]      Accession #:    [id number] Date of Birth: 1945-09-27      Patient Gender: F Patient Age:   78 years Exam Location:  [facility] Procedure:      VAS [place] GROIN PSEUDOANEURYSM Referring Phys: Leontine Locket --------------------------------------------------------------------------------  Exam: Right groin Indications: Patient complains of sudden onset of increased pain with ambulation s/p pseudoaneurysm compression. Bruising and palpable knot. History: S/p catheterization. Comparison Study: 01-14-2022 Pseudoaneurysm  evaluation with compression showed                   no residual flow within the previously observed residual lumen                   of the pseudoaneurysm after 15 minutes of compression.                    01-13-2022 Pseudoaneurysm compression showed a subcentimeter                   residual lumen after 35 minutes of compression. Performing Technologist: Darlin Coco RDMS, RVT  Examination Guidelines: A complete evaluation includes B-mode imaging, spectral Doppler, color Doppler, and power Doppler as needed of all accessible portions of each vessel. Bilateral testing is considered an integral part of a complete examination. Limited examinations for reoccurring indications may be performed as noted. +------------+----------+---------+------+----------+ Right DuplexPSV (cm/s)Waveform PlaqueComment(s) +------------+----------+---------+------+----------+ CFA            148    triphasic                 +------------+----------+---------+------+----------+ PFA            155    triphasic                 +------------+----------+---------+------+----------+ Prox SFA       212    triphasic                 +------------+----------+---------+------+----------+  Findings: An area with well defined borders was visualized with ultrasound characteristics of a pseudoaneurysm. Mixed echos within the structure suggest that it is partially thrombosed; however, evidence of recanalization of previously compressed pseudoaneurysm with a residual lumen measuring >1cm was visualized on today's examination.  Diagnosing physician: Orlie Pollen Electronically signed by Orlie Pollen on 01/16/2022 at 1:31:14 PM.    --------------------------------------------------------------------------------    Final       Scheduled Meds:  atorvastatin  80 mg Oral Daily   citalopram  10 mg Oral QHS   docusate sodium  100 mg Oral Daily   heparin  5,000 Units Subcutaneous Q8H   imipramine  100 mg Oral QHS   lisinopril  10  mg Oral Daily   mesalamine  2.4 g Oral Q breakfast   pantoprazole  40 mg Oral Daily  Continuous Infusions:  sodium chloride     sodium chloride     sodium chloride Stopped (01/16/22 0623)   magnesium sulfate bolus IVPB       LOS: 3 days     Dessa Phi, DO [facility] 01/17/2022, 1:18 PM   Available via Epic secure chat 7am-7pm After these hours, please refer to coverage provider listed on [email or web address]

## 2022-01-17 NOTE — Progress Notes (Signed)
Mobility Specialist - Progress Note   01/17/22 1327  Mobility  Activity Ambulated with assistance in hallway  Level of Assistance Standby assist, set-up cues, supervision of patient - no hands on  Assistive Device Front wheel walker  Distance Ambulated (ft) 200 ft  Activity Response Tolerated well  $Mobility charge 1 Mobility    Pt received in bed agreeable to mobility. Left in bed w/ call bell in reach and all needs met.   Paulla Dolly Mobility Specialist

## 2022-01-17 NOTE — Progress Notes (Addendum)
  Progress Note    01/17/2022 7:15 AM 2 Days Post-Op  Subjective:  sleeping; wakes easily.  Son at bedside  Afebrile   Vitals:   01/17/22 0017 01/17/22 0350  BP: 128/62 (!) 120/59  Pulse: 75 82  Resp: 19 14  Temp: 98.4 F (36.9 C) 98.6 F (37 C)  SpO2: 93% 99%    Physical Exam: General:  no distress Lungs:  non labored Incisions:  right groin incision is clean and dry; right thigh appears softer this morning Extremities:  right foot warm with palpable DP pulse Abdomen:  soft  CBC    Component Value Date/Time   WBC 12.8 (H) 01/17/2022 0129   RBC 2.81 (L) 01/17/2022 0129   HGB 8.6 (L) 01/17/2022 0129   HCT 25.9 (L) 01/17/2022 0129   PLT 218 01/17/2022 0129   MCV 92.2 01/17/2022 0129   MCH 30.6 01/17/2022 0129   MCHC 33.2 01/17/2022 0129   RDW 18.3 (H) 01/17/2022 0129   LYMPHSABS 1.4 01/13/2022 1417   MONOABS 0.7 01/13/2022 1417   EOSABS 0.0 01/13/2022 1417   BASOSABS 0.0 01/13/2022 1417    BMET    Component Value Date/Time   NA 137 01/16/2022 0126   K 4.0 01/16/2022 0126   CL 106 01/16/2022 0126   CO2 25 01/16/2022 0126   GLUCOSE 169 (H) 01/16/2022 0126   GLUCOSE 94 01/26/2006 1026   BUN 6 (L) 01/16/2022 0126   CREATININE 0.48 01/16/2022 0126   CALCIUM 8.4 (L) 01/16/2022 0126   GFRNONAA >60 01/16/2022 0126   GFRAA >60 05/17/2018 0516    INR    Component Value Date/Time   INR 1.0 01/06/2022 0818     Intake/Output Summary (Last 24 hours) at 01/17/2022 0715 Last data filed at 01/17/2022 0600 Gross per 24 hour  Intake 538.3 ml  Output 60 ml  Net 478.3 ml    JP output 60cc/24hr (20cc last shift)   Assessment/Plan:  76 y.o. female is s/p:  Right groin exploration Hematoma evacuation Right superficial femoral artery primary repair Drain placement  2 Days Post-Op   -pt doing well this am.  She has palpable right DP pulse. -right thigh appears softer this morning.   -may leave drain one more day-will d/w Dr. Virl Cagey.   -continue to  mobilize -DVT prophylaxis:  sq heparin   Leontine Locket, PA-C [facility] [phone] 01/17/2022 7:15 AM  VASCULAR STAFF ADDENDUM: I have independently interviewed and examined the patient. I agree with the above.  Mobilize, drain out tomorrow, home tomorrow pending no issues.   Cassandria Santee, MD [facility] Phone Number: [phone] 01/17/2022 1:22 PM

## 2022-01-18 DIAGNOSIS — I724 Aneurysm of artery of lower extremity: Secondary | ICD-10-CM | POA: Diagnosis not present

## 2022-01-18 LAB — CBC
HCT: 28.8 % — ABNORMAL LOW (ref 36.0–46.0)
Hemoglobin: 9.4 g/dL — ABNORMAL LOW (ref 12.0–15.0)
MCH: 30.4 pg (ref 26.0–34.0)
MCHC: 32.6 g/dL (ref 30.0–36.0)
MCV: 93.2 fL (ref 80.0–100.0)
Platelets: 231 10*3/uL (ref 150–400)
RBC: 3.09 MIL/uL — ABNORMAL LOW (ref 3.87–5.11)
RDW: 18.5 % — ABNORMAL HIGH (ref 11.5–15.5)
WBC: 9.4 10*3/uL (ref 4.0–10.5)
nRBC: 0 % (ref 0.0–0.2)

## 2022-01-18 NOTE — Progress Notes (Addendum)
  Progress Note    01/18/2022 7:43 AM 3 Days Post-Op  Subjective:  feeling well, no complaints    Vitals:   01/17/22 2358 01/18/22 0404  BP: (!) 141/63 125/69  Pulse: 72 76  Resp: 14 16  Temp: (!) 97.4 F (36.3 C) 98.2 F (36.8 C)  SpO2: 99% 96%    Physical Exam: Lungs:  nonlabored Incisions:  R groin incision intact and dry. R fem JP drain taken out with minimal bleeding, redressed with dry gauze Extremities:  Palpable DP pulses  CBC    Component Value Date/Time   WBC 9.4 01/18/2022 0112   RBC 3.09 (L) 01/18/2022 0112   HGB 9.4 (L) 01/18/2022 0112   HCT 28.8 (L) 01/18/2022 0112   PLT 231 01/18/2022 0112   MCV 93.2 01/18/2022 0112   MCH 30.4 01/18/2022 0112   MCHC 32.6 01/18/2022 0112   RDW 18.5 (H) 01/18/2022 0112   LYMPHSABS 1.4 01/13/2022 1417   MONOABS 0.7 01/13/2022 1417   EOSABS 0.0 01/13/2022 1417   BASOSABS 0.0 01/13/2022 1417    BMET    Component Value Date/Time   NA 137 01/16/2022 0126   K 4.0 01/16/2022 0126   CL 106 01/16/2022 0126   CO2 25 01/16/2022 0126   GLUCOSE 169 (H) 01/16/2022 0126   GLUCOSE 94 01/26/2006 1026   BUN 6 (L) 01/16/2022 0126   CREATININE 0.48 01/16/2022 0126   CALCIUM 8.4 (L) 01/16/2022 0126   GFRNONAA >60 01/16/2022 0126   GFRAA >60 05/17/2018 0516    INR    Component Value Date/Time   INR 1.0 01/06/2022 0818     Intake/Output Summary (Last 24 hours) at 01/18/2022 0743 Last data filed at 01/18/2022 0600 Gross per 24 hour  Intake 360 ml  Output 50 ml  Net 310 ml      Assessment/Plan:  76 y.o. female is 3 days post op, s/p: R groin hematoma evacuation and SFA primary repair with JP drain placement   -Palpable DP pulses bilaterally -R thigh is soft, incision is intact -JP drain taken out this AM, minimal bleeding. Site dressed with gauze -Clear for d/c from vascular standpoint   Vicente Serene, PA-C [facility] [phone] 01/18/2022 7:43 AM  VASCULAR STAFF ADDENDUM: I have  independently interviewed and examined the patient. I agree with the above.  Home today, follow up for wound check scheduled.   Cassandria Santee, MD [facility] Phone Number: [phone] 01/18/2022 10:23 AM

## 2022-01-18 NOTE — Progress Notes (Signed)
Pt d/c from 4E to home w/husband. D/c information and medication education provided to pt and spouse. Understanding verbalized. Tele and IV removed.  Raelyn Number, RN

## 2022-01-18 NOTE — Discharge Summary (Addendum)
Physician Discharge Summary  Cassandra Alexander GXQ:[id number] DOB: 1945-06-19 DOA: 01/13/2022  PCP: Alroy Dust, L.Marlou Sa, MD  Admit date: 01/13/2022 Discharge date: 01/18/2022  Admitted From: Home Disposition:  Home with outpatient PT   Recommendations for Outpatient Follow-up:  Follow up with PCP for repeat Hgb check  Follow up with vascular surgery Dr. Carlis Abbott   Discharge Condition: Stable CODE STATUS: Full  Diet recommendation: Heart healthy   Brief/Interim Summary:  Cassandra Alexander is a 76 y.o. female with medical history significant of recent CVA with cerebral aneurysm s/p thrombectomy apparently via right groin and since then she has had pain and bruising on the right groin area.  She went for her regular follow-up was seen by neurology.  Patient was found to be anemic.  Her hemoglobin has apparently dropped to 8.1 from 12.2 last week.  She also has a large area of her right groin that appears swollen.  Patient was found to have pseudoaneurysm of the right groin.  Dr. Carlis Abbott vascular surgery was consulted.  He was able to do ultrasound with compression of the pseudoaneurysm.  Patient has improved.  She was transfused 1 unit of packed red blood cells in the ER.  She had improved and was supposed to discharge home 11/3.  However, later that afternoon, she had new onset right groin pain.  Duplex ultrasound demonstrated graft in the proximal calf, new pseudoaneurysm and failed compression.  She underwent hematoma evacuation, right superficial femoral artery primary repair by Dr. Virl Cagey 11/3.  Discharge Diagnoses:   Principal Problem:   Femoral artery pseudo-aneurysm, right (HCC) Active Problems:   S/P cerebral aneurysm operation   Benign essential hypertension   Anxiety and depression   Gastroesophageal reflux disease   Right femoral pseudoaneurysm -Status post hematoma evacuation, right superficial femoral artery primary repair 11/3 -Vascular surgery following   Acute blood loss  anemia -Baseline hemoglobin 12.2 --> 8.1 -Status post 1 unit packed red blood cell transfusion 11/1 + 1 unit 11/3 -Hgb stable today    Hypertension -Lisinopril   Recent CVA -Status post mechanical thrombectomy via right transfemoral access -Aspirin and Plavix on hold due to pseudoaneurysm - resume per vascular  -Lipitor    Anxiety/depression -Citalopram, imipramine     Discharge Instructions  Discharge Instructions     Call MD for:  difficulty breathing, headache or visual disturbances   Complete by: As directed    Call MD for:  extreme fatigue   Complete by: As directed    Call MD for:  persistant dizziness or light-headedness   Complete by: As directed    Call MD for:  persistant nausea and vomiting   Complete by: As directed    Call MD for:  severe uncontrolled pain   Complete by: As directed    Call MD for:  temperature >100.4   Complete by: As directed    Diet - low sodium heart healthy   Complete by: As directed    Discharge instructions   Complete by: As directed    You were cared for by a hospitalist during your hospital stay. If you have any questions about your discharge medications or the care you received while you were in the hospital after you are discharged, you can call the unit and ask to speak with the hospitalist on call if the hospitalist that took care of you is not available. Once you are discharged, your primary care physician will handle any further medical issues. Please note that NO REFILLS for any discharge medications  will be authorized once you are discharged, as it is imperative that you return to your primary care physician (or establish a relationship with a primary care physician if you do not have one) for your aftercare needs so that they can reassess your need for medications and monitor your lab values.   Increase activity slowly   Complete by: As directed    Leave dressing on - Keep it clean, dry, and intact until clinic visit   Complete  by: As directed       Allergies as of 01/18/2022       Reactions   Cortisone Swelling   Facial swelling Headache   Penicillins Rash   Childhood reaction Has patient had a PCN reaction causing immediate rash, facial/tongue/throat swelling, SOB or lightheadedness with hypotension: Yes Has patient had a PCN reaction causing severe rash involving mucus membranes or skin necrosis: No Has patient had a PCN reaction that required hospitalization No Has patient had a PCN reaction occurring within the last 10 years: No If all of the above answers are "NO", then may proceed with Cephalosporin use.        Medication List     STOP taking these medications    OCEANIC-STROKE asundexian or placebo 50 mg tablet       TAKE these medications    aspirin 81 MG chewable tablet Chew 1 tablet (81 mg total) by mouth daily.   atorvastatin 80 MG tablet Commonly known as: LIPITOR Take 1 tablet (80 mg total) by mouth daily.   citalopram 10 MG tablet Commonly known as: CELEXA Take 10 mg by mouth at bedtime.   clopidogrel 75 MG tablet Commonly known as: PLAVIX Take 1 tablet (75 mg total) by mouth daily.   imipramine 50 MG tablet Commonly known as: TOFRANIL Take 2 tablets by mouth at bedtime.   lisinopril 10 MG tablet Commonly known as: ZESTRIL Take 10 mg by mouth daily.   mesalamine 1.2 g EC tablet Commonly known as: LIALDA Take 2.4 g by mouth daily with breakfast.   nicotine polacrilex 2 MG gum Commonly known as: NICORETTE Take 2 mg by mouth as needed for smoking cessation.   pantoprazole 20 MG tablet Commonly known as: PROTONIX Take 20 mg by mouth daily.               Discharge Care Instructions  (From admission, onward)           Start     Ordered   01/18/22 0000  Leave dressing on - Keep it clean, dry, and intact until clinic visit        01/18/22 1237            Follow-up Information     Alroy Dust, L.Marlou Sa, MD. Schedule an appointment as soon as  possible for a visit in 1 week(s).   Specialty: Family Medicine Contact information: [address] [place] [address] [phone]         [facility] Follow up.   Specialty: Rehabilitation Why: new referrals sent for Outpt PT/OT/SLP Contact information: [place] [id number] [place] [phone]        [facility] Follow up in 2 week(s).   Specialty: Vascular Surgery Why: Office will call to arrange your appt(s) (sent) Contact information: [address] [place] [phone]        Garvin Fila, MD. Schedule an appointment as soon as possible for  a visit in 2 month(s).   Specialties: Neurology, Radiology Contact information: [address] [place] [address] [phone]                Allergies  Allergen Reactions   Cortisone Swelling    Facial swelling Headache   Penicillins Rash    Childhood reaction Has patient had a PCN reaction causing immediate rash, facial/tongue/throat swelling, SOB or lightheadedness with hypotension: Yes Has patient had a PCN reaction causing severe rash involving mucus membranes or skin necrosis: No Has patient had a PCN reaction that required hospitalization No Has patient had a PCN reaction occurring within the last 10 years: No If all of the above answers are "NO", then may proceed with Cephalosporin use.     Consultations: Vascular surgery    Procedures/Studies: VAS [place] GROIN PSEUDOANEURYSM  Result Date: 01/16/2022  ARTERIAL PSEUDOANEURYSM  Patient Name:  JOSCELYNN BRUTUS  Date of Exam:   01/15/2022 Medical Rec #: [id number]      Accession #:    [id number] Date of Birth: [birth year]      Patient Gender: F Patient Age:   61 years Exam Location:  [facility] Procedure:      VAS [place] GROIN PSEUDOANEURYSM Referring Phys: Leontine Locket  --------------------------------------------------------------------------------  Exam: Right groin Indications: Patient complains of sudden onset of increased pain with ambulation s/p pseudoaneurysm compression. Bruising and palpable knot. History: S/p catheterization. Comparison Study: 01-14-2022 Pseudoaneurysm evaluation with compression showed                   no residual flow within the previously observed residual lumen                   of the pseudoaneurysm after 15 minutes of compression.                    01-13-2022 Pseudoaneurysm compression showed a subcentimeter                   residual lumen after 35 minutes of compression. Performing Technologist: Darlin Coco RDMS, RVT  Examination Guidelines: A complete evaluation includes B-mode imaging, spectral Doppler, color Doppler, and power Doppler as needed of all accessible portions of each vessel. Bilateral testing is considered an integral part of a complete examination. Limited examinations for reoccurring indications may be performed as noted. +------------+----------+---------+------+----------+ Right DuplexPSV (cm/s)Waveform PlaqueComment(s) +------------+----------+---------+------+----------+ CFA            148    triphasic                 +------------+----------+---------+------+----------+ PFA            155    triphasic                 +------------+----------+---------+------+----------+ Prox SFA       212    triphasic                 +------------+----------+---------+------+----------+  Findings: An area with well defined borders was visualized with ultrasound characteristics of a pseudoaneurysm. Mixed echos within the structure suggest that it is partially thrombosed; however, evidence of recanalization of previously compressed pseudoaneurysm with a residual lumen measuring >1cm was visualized on today's examination.  Diagnosing physician: Orlie Pollen Electronically signed by Orlie Pollen on 01/16/2022 at  1:31:14 PM.    --------------------------------------------------------------------------------    Final    VAS [place] GROIN PSEUDOANEURYSM  Result Date: 01/14/2022  ARTERIAL PSEUDOANEURYSM  Patient Name:  [name]  Date of Exam:   01/14/2022 Medical Rec #: [id number]      Accession #:    [id number] Date of Birth: Feb 08, 1946      Patient Gender: F Patient Age:   48 years Exam Location:  [facility] Procedure:      VAS [place] GROIN PSEUDOANEURYSM Referring Phys: Monica Martinez --------------------------------------------------------------------------------  Exam: Right groin Indications: Patient complains of groin pain, bruising and active bleed visualized on CT abdomen 01-13-2022 post-compression. Comparison Study: 01-13-2022 Lower extremity pseudoaneurysm compression study                   showed a subcentimeter residual lumen. Performing Technologist: Darlin Coco RDMS, RVT  Examination Guidelines: A complete evaluation includes B-mode imaging, spectral Doppler, color Doppler, and power Doppler as needed of all accessible portions of each vessel. Bilateral testing is considered an integral part of a complete examination. Limited examinations for reoccurring indications may be performed as noted. +------------+----------+---------+------+----------+ Right DuplexPSV (cm/s)Waveform PlaqueComment(s) +------------+----------+---------+------+----------+ Prox SFA       202    triphasic                 +------------+----------+---------+------+----------+  Summary: A partially thrombosed pseudoaneurysm with residual lumen of 0.74 x 0.55 cm was visualized arising from the proximal SFA. Compression was performed for 15 minutes. Post-compression, no flow was visualized within the previously observed residual lumen. The SFA, DFA, and distal PTA were all observed to be patent. Post-compression hematoma measured 5.6 x 2.8 x 2.3 cm. Diagnosing physician: Jamelle Haring Electronically signed by Jamelle Haring  on 01/14/2022 at 4:00:07 PM.    --------------------------------------------------------------------------------    Final    VAS [place] LOWER EXT ARTERIAL PSEUDOANEURYSM COMPRESSION  Result Date: 01/14/2022  ARTERIAL PSEUDOANEURYSM  Patient Name:  DIOSELINA BRUMBAUGH  Date of Exam:   01/13/2022 Medical Rec #: [id number]      Accession #:    [id number] Date of Birth: 29-Nov-1945      Patient Gender: F Patient Age:   56 years Exam [facility]:  [facility] Procedure:      VAS [place] LOWER EXT ARTERIAL PSEUDOANEURYSM COMPRESSION Referring Phys: Monica Martinez --------------------------------------------------------------------------------  Exam: Right groin Indications: Patient complains of groin pain and bruising. History: S/p catheterization 2.3 x 2.9 cm pseudoaneurysm on ultrasound evaluation. Comparison Study: 01-13-2022 Right lower extremity arterial pseudoaneurysm                   evaluation. Performing Technologist: Darlin Coco RDMS, RVT  Examination Guidelines: A complete evaluation includes B-mode imaging, spectral Doppler, color Doppler, and power Doppler as needed of all accessible portions of each vessel. Bilateral testing is considered an integral part of a complete examination. Limited examinations for reoccurring indications may be performed as noted. +------------+----------+---------+------+----------+ Right DuplexPSV (cm/s)Waveform PlaqueComment(s) +------------+----------+---------+------+----------+ CFA             74    triphasic                 +------------+----------+---------+------+----------+ PFA             63    triphasic                 +------------+----------+---------+------+----------+ Prox SFA       137    triphasic                 +------------+----------+---------+------+----------+  Summary: Pseudoaneurysm was identified arising from the proximal SFA at the bifurcation. Pressure was held for 35 minutes.  Residual lumen measured 62m x 228m The SFA, PFA, and distal  PTA were all observed to be patent with triphasic waveforms post-compression. Diagnosing physician: ThJamelle Haringlectronically signed by ThJamelle Haringn 01/14/2022 at 3:59:31 PM.    --------------------------------------------------------------------------------    Final    CT Abdomen Pelvis W Contrast  Result Date: 01/13/2022 CLINICAL DATA:  Right groin bruising and pain following mechanical thrombectomy last week via right femoral line, procedure dated 01/06/2022. Concern for anemia and retroperitoneal bleed. Abdominal pain. EXAM: CT ABDOMEN AND PELVIS WITH CONTRAST TECHNIQUE: Multidetector CT imaging of the abdomen and pelvis was performed using the standard protocol following bolus administration of intravenous contrast. RADIATION DOSE REDUCTION: This exam was performed according to the departmental dose-optimization program which includes automated exposure control, adjustment of the mA and/or kV according to patient size and/or use of iterative reconstruction technique. CONTRAST:  7540mMNIPAQUE IOHEXOL 350 MG/ML SOLN COMPARISON:  PET-CT 06/08/2021, CT abdomen pelvis with IV contrast 03/17/2021, 09/06/2019. FINDINGS: Lower chest: Lung bases again demonstrate subsegmental linear atelectasis and a calcified left lower lobe posterior basal granuloma. There is mild cardiomegaly. There are coronary artery calcifications. No pericardial effusion. Small hiatal hernia. Hepatobiliary: There is mild hepatic steatosis without mass enhancement. Gallbladder and bile ducts are unremarkable. Pancreas: No focal abnormality, ductal dilatation or inflammatory changes. Spleen: No focal abnormality or splenomegaly. Adrenals/Urinary Tract: There is no adrenal or renal cortical mass enhancement. Again, a thinly septated and otherwise homogeneous 5 cm cyst in the medial upper pole left kidney is noted measuring 19 Hounsfield units both initially in the delayed phase. This is unchanged in size dating back to 09/06/2019. No  urinary stone or obstruction seen. No bladder thickening. Stomach/Bowel: Small hiatal hernia. Moderate thickened folds in the stomach including the antro pyloric area. The unopacified small bowel is unremarkable. Large volume of stool in the cecum, ascending and transverse colon again is noted. There are colonic diverticula without evidence of wall thickening or inflammatory change. An appendix is not seen in this patient. Patent sigmoid surgical anastomosis is again seen in the left lower abdominopelvic quadrant. Vascular/Lymphatic: On series 3 axial image 74 and coronal reconstruction image 31, there is a right groin hematoma overlying and compressing and significantly narrowing the proximal right superficial femoral artery and measuring 3.8 x 3.1 x 6.7 cm. There is a linear contrast leak into the hematoma from the anterior wall of the proximal right superficial femoral artery on 3:70 and 71 consistent with active bleed. The anterior compartment thigh musculature overlying this is asymmetrically larger with likely intramuscular hematoma, but this is difficult to measure as it is only partially imaged and is isodense. Clinical correlation is recommended to exclude elevated anterior thigh compartment pressures. Below this level the proximal superficial femoral artery becomes normal in caliber. There is only mild narrowing in the proximal deep right femoral artery, related to mass effect from the hematoma. Below the hematoma this artery is normal caliber also. No adenopathy is seen. No retroperitoneal hematoma. There is moderate aortoiliac calcific plaque without AAA. There are numerous pelvic phleboliths. Reproductive: Status post hysterectomy. No adnexal masses. Other: There is no free fluid, free hemorrhage or free air. There is no incarcerated hernia. Musculoskeletal: There is old L4-5 posterior fusion hardware with interbody metallic disc space apparatus, with lumbar S shaped scoliosis and advanced degenerative  changes with osteopenia. No new bone abnormality. IMPRESSION: 1. 3.8 x 3.1 x 6.7 cm right groin hematoma overlying and compressing and significantly narrowing the proximal right superficial femoral artery.  Infectious complication not excluded. 2. There is a linear contrast leak into the hematoma from the anterior wall of the proximal right superficial femoral artery consistent with active bleed. 3. The anterior compartment thigh musculature overlying this hematoma is asymmetrically larger with likely intramuscular hematoma, but this is difficult to measure as it is only partially imaged and isodense. Clinical correlation is recommended to exclude elevated anterior thigh compartment pressures. Again, infectious complication is not excluded. 4. No retroperitoneal hematoma. 5. Constipation and diverticulosis. 6. Aortic and coronary artery atherosclerosis. 7. Small hiatal hernia with probable gastritis. Endoscopy may be indicated given the degree of wall thickening. 8. Stable 5 cm Bosniak 2 thinly septated cyst in the upper pole left kidney. Aortic Atherosclerosis (ICD10-I70.0). Electronically Signed   By: Telford Nab M.D.   On: 01/13/2022 20:43   VAS [place] GROIN PSEUDOANEURYSM  Result Date: 01/13/2022  ARTERIAL PSEUDOANEURYSM  Patient Name:  AYSE MCCARTIN  Date of Exam:   01/13/2022 Medical Rec #: [id number]      Accession #:    [id number] Date of Birth: Jan 23, 1946      Patient Gender: F Patient Age:   89 years Exam Location:  [facility] Procedure:      VAS [place] GROIN PSEUDOANEURYSM Referring Phys: Sherlie Ban MATTHEWS --------------------------------------------------------------------------------  Exam: Right groin Indications: Patient complains of groin pain and bruising. History: S/p catheterization. Performing Technologist: Archie Patten RVS  Examination Guidelines: A complete evaluation includes B-mode imaging, spectral Doppler, color Doppler, and power Doppler as needed of all accessible portions of each  vessel. Bilateral testing is considered an integral part of a complete examination. Limited examinations for reoccurring indications may be performed as noted. +------------+----------+---------+------+----------+ Right DuplexPSV (cm/s)Waveform PlaqueComment(s) +------------+----------+---------+------+----------+ CFA            175    triphasic                 +------------+----------+---------+------+----------+ Prox SFA        87    triphasic                 +------------+----------+---------+------+----------+  Findings: An area with well defined borders measuring 2.3 cm x 2.9 cm was visualized arising off of the Distal CFA with ultrasound characteristics of a pseudoaneurysm. The neck measures approximately 0.1 cm wide and 0.1 cm long. Pseduo neck appears very short measuring <71m.  Diagnosing physician: CMonica MartinezMD Electronically signed by CMonica MartinezMD on 01/13/2022 at 4:26:08 PM.    --------------------------------------------------------------------------------    Final    IR PERCUTANEOUS ART THROMBECTOMY/INFUSION INTRACRANIAL INC DIAG ANGIO  Result Date: 01/08/2022 INDICATION: History of acute change in mental status, and diffuse weakness and left sided nystagmus. 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 EXAM: 1. EMERGENT LARGE VESSEL OCCLUSION THROMBOLYSIS (POSTERIOR CIRCULATION) COMPARISON:  None Available. MEDICATIONS: Ancef 2 g IV antibiotic was administered within 1 hour of the procedure. ANESTHESIA/SEDATION: General anesthesia. CONTRAST:  Omnipaque 300 approximately 90 cc. FLUOROSCOPY TIME:  Fluoroscopy Time: 53 minutes 12 seconds (1411 mGy). COMPLICATIONS: None immediate. TECHNIQUE: Following a full explanation of the procedure along with the potential associated complications, an informed witnessed consent was obtained. The risks of intracranial hemorrhage of 10%, worsening neurological deficit, ventilator dependency, death  and inability to revascularize were all reviewed in detail with the patient's husband. The patient was then put under general anesthesia by the Department of Anesthesiology at [facility] The right groin was prepped and draped in the usual sterile fashion. Thereafter  using modified Seldinger technique, transfemoral access into the right common femoral artery was obtained without difficulty. Over a 0.035 inch guidewire an 8 [place] 15 cm [place] Pinnacle sheath was inserted. Through this, and also over a 0.035 inch guidewire a 5.5 French 125 cm Berenstein support catheter inside of an 088 95 cm Neuron Max sheath was advanced to the aortic arch region. The support catheter was then advanced into the right subclavian artery just proximal to the origin of the right vertebral artery. The guidewire was removed. Good aspiration was obtained from the hub of the Neuron Max sheath. Control arteriogram at the origin of the right vertebral artery was then performed centered extra cranially and intracranially. FINDINGS: The right vertebral artery origin demonstrated patency proximally, with a moderate kink distal to this. More distally, the vessel is seen to opacify to the cranial skull base. Contrast was seen extending into the proximal basilar artery. PROCEDURE: Attempts were made to advanced an intermediary catheter through the right vertebral artery distally with an 035 inch guidewire, 024 micro guidewire with an 027 microcatheter without success due to the extreme of the innominate artery origin from the aortic arch resulting in herniation of the platform into the aortic arch. Eventually access was obtained using a 125 cm 4 French sheath inside of an 80 cm Infinity guide catheter. The Infinity guide sheath could only be advanced into the proximal 1/3 of the right vertebral artery with further advancement resulting in herniation of the platform into the aortic arch. The guide sheath was then exchanged for a 300 cm 014  inch BMW support guidewire in the V3 region of the right vertebral artery. An 021 160 cm microcatheter inside of a 132 cm 055 Zoom aspiration catheter was advanced without difficulty to the proximal basilar artery. The micro guidewire was then gently advanced without difficulty into the left posterior cerebral artery P2 region followed by the microcatheter. The guidewire was removed. Good aspiration was obtained from the hub of the microcatheter which was connected to continuous heparinized saline infusion. At this time, the Zoom aspiration catheter was advanced without difficulty into the proximal P2 region. A 4 mm x 40 mm Solitaire X retrieval device was then deployed. Thereafter, the proximal aspiration at the hub of the Zoom aspiration catheter with a Penumbra pump, and at the hub of the [place] guide catheter with a 20 mL syringe, the combination of the retrieval device, the Zoom aspiration catheter was retrieved and removed. A control arteriogram performed through the Infinity guide catheter in the proximal right vertebral artery demonstrated revascularization of the distal basilar artery with opacification of the bilaterally duplicated superior cerebellar artery branches, the anterior-inferior cerebellar arteries, and also of the left posterior cerebral artery. The right PCA opacified only transiently due to inflow from the inferior circulation via the posterior communicating artery. A TICI 2C was achieved. A final control arteriogram performed through the Infinity guide catheter in the right subclavian artery demonstrates the right vertebral artery to be widely patent extra cranially and intracranially. An 8 French Angio-Seal closure device was deployed for hemostasis at the right groin puncture site after removal of the Pinnacle sheath. Distal pulses remained Dopplerable in both feet unchanged. CT brain demonstrated no evidence of intracranial hemorrhage. The patient was left intubated due to her medical  condition to protect the airway. She was then transferred to the PACU and then neuro ICU for post revascularization care. IMPRESSION: Endovascular complete revascularization of occluded distal basilar artery with 1 pass with a 4  mm x 40 mm Solitaire X retrieval device and contact aspiration achieving a TICI 2C revascularization. PLAN: Follow-up CT angiogram of the head and neck in six-months. Electronically Signed   By: Luanne Bras M.D.   On: 01/08/2022 07:44   IR CT Head Ltd  Result Date: 01/08/2022 INDICATION: History of acute change in mental status, and diffuse weakness and left sided nystagmus. 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 EXAM: 1. EMERGENT LARGE VESSEL OCCLUSION THROMBOLYSIS (POSTERIOR CIRCULATION) COMPARISON:  None Available. MEDICATIONS: Ancef 2 g IV antibiotic was administered within 1 hour of the procedure. ANESTHESIA/SEDATION: General anesthesia. CONTRAST:  Omnipaque 300 approximately 90 cc. FLUOROSCOPY TIME:  Fluoroscopy Time: 53 minutes 12 seconds (1411 mGy). COMPLICATIONS: None immediate. TECHNIQUE: Following a full explanation of the procedure along with the potential associated complications, an informed witnessed consent was obtained. The risks of intracranial hemorrhage of 10%, worsening neurological deficit, ventilator dependency, death and inability to revascularize were all reviewed in detail with the patient's husband. The patient was then put under general anesthesia by the Department of Anesthesiology at [facility]. The right groin was prepped and draped in the usual sterile fashion. Thereafter using modified Seldinger technique, transfemoral access into the right common femoral artery was obtained without difficulty. Over a 0.035 inch guidewire an 8 [place] 15 cm [place] Pinnacle sheath was inserted. Through this, and also over a 0.035 inch guidewire a 5.5 French 125 cm Berenstein support catheter inside of an 088 95 cm  Neuron Max sheath was advanced to the aortic arch region. The support catheter was then advanced into the right subclavian artery just proximal to the origin of the right vertebral artery. The guidewire was removed. Good aspiration was obtained from the hub of the Neuron Max sheath. Control arteriogram at the origin of the right vertebral artery was then performed centered extra cranially and intracranially. FINDINGS: The right vertebral artery origin demonstrated patency proximally, with a moderate kink distal to this. More distally, the vessel is seen to opacify to the cranial skull base. Contrast was seen extending into the proximal basilar artery. PROCEDURE: Attempts were made to advanced an intermediary catheter through the right vertebral artery distally with an 035 inch guidewire, 024 micro guidewire with an 027 microcatheter without success due to the extreme of the innominate artery origin from the aortic arch resulting in herniation of the platform into the aortic arch. Eventually access was obtained using a 125 cm 4 French sheath inside of an 80 cm Infinity guide catheter. The Infinity guide sheath could only be advanced into the proximal 1/3 of the right vertebral artery with further advancement resulting in herniation of the platform into the aortic arch. The guide sheath was then exchanged for a 300 cm 014 inch BMW support guidewire in the V3 region of the right vertebral artery. An 021 160 cm microcatheter inside of a 132 cm 055 Zoom aspiration catheter was advanced without difficulty to the proximal basilar artery. The micro guidewire was then gently advanced without difficulty into the left posterior cerebral artery P2 region followed by the microcatheter. The guidewire was removed. Good aspiration was obtained from the hub of the microcatheter which was connected to continuous heparinized saline infusion. At this time, the Zoom aspiration catheter was advanced without difficulty into the proximal  P2 region. A 4 mm x 40 mm Solitaire X retrieval device was then deployed. Thereafter, the proximal aspiration at the hub of the Zoom aspiration catheter with a [place]  pump, and at the hub of the [place] guide catheter with a 20 mL syringe, the combination of the retrieval device, the Zoom aspiration catheter was retrieved and removed. A control arteriogram performed through the Infinity guide catheter in the proximal right vertebral artery demonstrated revascularization of the distal basilar artery with opacification of the bilaterally duplicated superior cerebellar artery branches, the anterior-inferior cerebellar arteries, and also of the left posterior cerebral artery. The right PCA opacified only transiently due to inflow from the inferior circulation via the posterior communicating artery. A TICI 2C was achieved. A final control arteriogram performed through the Infinity guide catheter in the right subclavian artery demonstrates the right vertebral artery to be widely patent extra cranially and intracranially. An 8 French Angio-Seal closure device was deployed for hemostasis at the right groin puncture site after removal of the Pinnacle sheath. Distal pulses remained Dopplerable in both feet unchanged. CT brain demonstrated no evidence of intracranial hemorrhage. The patient was left intubated due to her medical condition to protect the airway. She was then transferred to the PACU and then neuro ICU for post revascularization care. IMPRESSION: Endovascular complete revascularization of occluded distal basilar artery with 1 pass with a 4 mm x 40 mm Solitaire X retrieval device and contact aspiration achieving a TICI 2C revascularization. PLAN: Follow-up CT angiogram of the head and neck in six-months. Electronically Signed   By: Luanne Bras M.D.   On: 01/08/2022 07:44   ECHOCARDIOGRAM COMPLETE  Result Date: 01/07/2022    ECHOCARDIOGRAM REPORT   Patient Name:   CALLIE FACEY Date of Exam:  01/07/2022 Medical Rec #:  [id number]     Height:       60.0 in Accession #:    [id number]    Weight:       112.9 lb Date of Birth:  1945-08-05     BSA:          1.464 m Patient Age:    68 years      BP:           135/66 mmHg Patient Gender: F             HR:           74 bpm. Exam Location:  Inpatient Procedure: 2D Echo, Color Doppler and Cardiac Doppler Indications:    Stroke  History:        Patient has prior history of Echocardiogram examinations, most                 recent 09/26/2021. TIA; Risk Factors:Dyslipidemia.  Sonographer:    Memory Argue Referring Phys: [place]  1. Left ventricular ejection fraction, by estimation, is 60 to 65%. The left ventricle has normal function. The left ventricle has no regional wall motion abnormalities. Left ventricular diastolic parameters are indeterminate.  2. Right ventricular systolic function is normal. The right ventricular size is normal.  3. The mitral valve is normal in structure. Trivial mitral valve regurgitation. No evidence of mitral stenosis.  4. The aortic valve is tricuspid. Aortic valve regurgitation is not visualized. No aortic stenosis is present.  5. The inferior vena cava is normal in size with greater than 50% respiratory variability, suggesting right atrial pressure of 3 mmHg. FINDINGS  Left Ventricle: Left ventricular ejection fraction, by estimation, is 60 to 65%. The left ventricle has normal function. The left ventricle has no regional wall motion abnormalities. The left ventricular internal cavity size was normal in size. There is  no left ventricular hypertrophy. Left ventricular diastolic parameters are indeterminate. Right Ventricle: The right ventricular size is normal. No increase in right ventricular wall thickness. Right ventricular systolic function is normal. Left Atrium: Left atrial size was normal in size. Right Atrium: Right atrial size was normal in size. Pericardium: There is no evidence of pericardial  effusion. Presence of epicardial fat layer. Mitral Valve: The mitral valve is normal in structure. Trivial mitral valve regurgitation. No evidence of mitral valve stenosis. Tricuspid Valve: The tricuspid valve is normal in structure. Tricuspid valve regurgitation is not demonstrated. No evidence of tricuspid stenosis. Aortic Valve: The aortic valve is tricuspid. Aortic valve regurgitation is not visualized. No aortic stenosis is present. Aortic valve mean gradient measures 2.0 mmHg. Aortic valve peak gradient measures 4.5 mmHg. Aortic valve area, by VTI measures 2.67 cm. Pulmonic Valve: The pulmonic valve was normal in structure. Pulmonic valve regurgitation is not visualized. No evidence of pulmonic stenosis. Aorta: The aortic root is normal in size and structure. Venous: The inferior vena cava is normal in size with greater than 50% respiratory variability, suggesting right atrial pressure of 3 mmHg. IAS/Shunts: No atrial level shunt detected by color flow Doppler.  LEFT VENTRICLE PLAX 2D LVIDd:         4.20 cm   Diastology LVIDs:         3.00 cm   LV e' medial:    6.37 cm/s LV PW:         0.90 cm   LV E/e' medial:  10.8 LV IVS:        1.00 cm   LV e' lateral:   6.84 cm/s LVOT diam:     2.00 cm   LV E/e' lateral: 10.1 LV SV:         50 LV SV Index:   34 LVOT Area:     3.14 cm  RIGHT VENTRICLE RV S prime:     14.80 cm/s LEFT ATRIUM             Index        RIGHT ATRIUM          Index LA Vol (A2C):   27.5 ml 18.79 ml/m  RA Area:     7.19 cm LA Vol (A4C):   21.3 ml 14.55 ml/m  RA Volume:   12.10 ml 8.27 ml/m LA Biplane Vol: 26.6 ml 18.17 ml/m  AORTIC VALVE AV Area (Vmax):    2.64 cm AV Area (Vmean):   2.48 cm AV Area (VTI):     2.67 cm AV Vmax:           106.00 cm/s AV Vmean:          70.000 cm/s AV VTI:            0.186 m AV Peak Grad:      4.5 mmHg AV Mean Grad:      2.0 mmHg LVOT Vmax:         89.20 cm/s LVOT Vmean:        55.200 cm/s LVOT VTI:          0.158 m LVOT/AV VTI ratio: 0.85  AORTA Ao Root  diam: 3.20 cm Ao Asc diam:  3.10 cm MITRAL VALVE MV Area (PHT): 3.65 cm    SHUNTS MV Decel Time: 208 msec    Systemic VTI:  0.16 m MV E velocity: 68.80 cm/s  Systemic Diam: 2.00 cm MV A velocity: 91.50 cm/s MV E/A ratio:  0.75 Kardie Tobb  DO Electronically signed by Berniece Salines DO Signature Date/Time: 01/07/2022/2:15:40 PM    Final    MR BRAIN WO CONTRAST  Result Date: 01/07/2022 CLINICAL DATA:  Stroke follow-up EXAM: MRI HEAD WITHOUT CONTRAST TECHNIQUE: Multiplanar, multiecho pulse sequences of the brain and surrounding structures were obtained without intravenous contrast. COMPARISON:  CTA head/neck 01/06/22 FINDINGS: Brain: Acute infarcts in the bilateral cerebellar hemispheres (series 3, image 14). Small foci of acute infarcts are also seen in the pons (series 3, image 16). There is no evidence of hemorrhage. Sequela of moderate chronic microvascular ischemic change with a chronic infarct in the right centrum semiovale. There is incomplete suppression of fluid signal on FLAIR imaging in the bilateral parietal and occipital lobes. These areas do not demonstrate signal on susceptibility weighted imaging. No hydrocephalus. No extra-axial fluid collection. Vascular: There is diminished flow void at the tip of the basilar artery (series 6, image 10), nonspecific. Skull and upper cervical spine: Normal marrow signal. Sinuses/Orbits: Bilateral lens replacement. Mild mucosal thickening bilateral sphenoid sinuses. Other: None. IMPRESSION: 1. 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 No evidence of hemorrhage. 2. Diminished flow void at the tip of the basilar artery, nonspecific, but possibly secondary to recent intervention. 3. Incomplete suppression of fluid signal on FLAIR imaging in the bilateral parietal and occipital lobes, which is favored to be artifactual. Electronically Signed   By: Marin Roberts M.D.   On: 01/07/2022 10:00   DG Chest Port 1 View  Result Date:  01/07/2022 CLINICAL DATA:  Provided history: ETT.  Stroke. EXAM: PORTABLE CHEST 1 VIEW COMPARISON:  Prior chest radiographs 01/06/2022 and earlier. Chest CT 05/01/2021. FINDINGS: ET tube present with tip terminating 2.4 cm above the level of the carina. An enteric tube passes below the level left hemidiaphragm. The tip of the enteric tube or the incompletely imaged side-port is present at the level of the gastric body. A loop recorder device overlies the left chest. Heart size within normal limits. Aortic atherosclerosis. Persistent mild ill-defined opacity within the right lung base. Mild atelectasis within the left lung base. The 1.4 cm focus of nodular airspace consolidation demonstrated within the left upper lobe on the prior chest CT of 05/01/2021 is occult on the current examination. Mild chronic elevation of the right hemidiaphragm. No evidence of pleural effusion or pneumothorax. No acute bony abnormality identified. IMPRESSION: Support apparatus as described. Persistent mild ill-defined opacity within the right lung base. This has an appearance favoring atelectasis. However, aspiration/pneumonia cannot be excluded. Mild atelectasis within the left lung base. The 1.4 cm focus of nodular airspace consolidation demonstrated within the left upper lobe on the prior chest CT of 05/01/2021 is occult on the current examination. Please refer to this prior examination for further description and for follow-up recommendations. Electronically Signed   By: Kellie Simmering D.O.   On: 01/07/2022 09:16   DG Abd Portable 1V  Result Date: 01/06/2022 CLINICAL DATA:  Enteric catheter placement EXAM: PORTABLE ABDOMEN - 1 VIEW COMPARISON:  None Available. FINDINGS: Frontal view of the lower chest and upper abdomen demonstrates enteric catheter passing below diaphragm tip and side port projecting over the gastric body. Lung bases are clear. Excreted contrast within the kidneys. Bowel gas pattern is unremarkable. IMPRESSION: 1.  Enteric catheter tip and side port projecting over gastric body. Electronically Signed   By: Randa Ngo M.D.   On: 01/06/2022 18:19   DG Chest Portable 1 View  Result Date: 01/06/2022 CLINICAL DATA:  Provided history: Altered mental  status. Code stroke. EXAM: PORTABLE CHEST 1 VIEW COMPARISON:  Chest radiographs 02/28/2020 and earlier. Chest CT 05/01/2021. FINDINGS: A loop recorder device overlies the left chest. Heart size within normal limits. Aortic atherosclerosis. Mild atelectasis within the right lung base. The 1.4 cm focus of nodular airspace consolidation demonstrated within the left upper lobe on the prior chest CT of 05/01/2021 is occult on the current exam. Mild chronic elevation of the right hemidiaphragm. No evidence of pleural effusion or pneumothorax. No acute bony abnormality identified. Levocurvature of the thoracic spine. Thoracic spondylosis. IMPRESSION: Mild atelectasis within the right lung base. The 1.4 cm focus of nodular airspace consolidation demonstrated within the left upper lobe on the prior chest CT of 05/01/2021 is occult on the current examination. Please refer to this prior examination for further description and for follow-up recommendations. Aortic Atherosclerosis (ICD10-I70.0). Electronically Signed   By: Kellie Simmering D.O.   On: 01/06/2022 09:16   CT ANGIO HEAD NECK W WO CM W PERF (CODE STROKE)  Result Date: 01/06/2022 CLINICAL DATA:  Neuro deficit, acute, stroke suspected. EXAM: CT ANGIOGRAPHY HEAD AND NECK CT PERFUSION BRAIN TECHNIQUE: Multidetector CT imaging of the head and neck was performed using the standard protocol during bolus administration of intravenous contrast. Multiplanar CT image reconstructions and MIPs were obtained to evaluate the vascular anatomy. Carotid stenosis measurements (when applicable) are obtained utilizing NASCET criteria, using the distal internal carotid diameter as the denominator. Multiphase CT imaging of the brain was performed  following IV bolus contrast injection. Subsequent parametric perfusion maps were calculated using RAPID software. RADIATION DOSE REDUCTION: This exam was performed according to the departmental dose-optimization program which includes automated exposure control, adjustment of the mA and/or kV according to patient size and/or use of iterative reconstruction technique. CONTRAST:  178m OMNIPAQUE IOHEXOL 350 MG/ML SOLN COMPARISON:  Head CT same day. MRI 09/25/2021. CT angiography 09/25/2021. FINDINGS: CTA NECK FINDINGS Aortic arch: Aortic atherosclerosis. Innominate artery origin not included on the scan. Small left vertebral artery arises directly from the arch Right carotid system: Common carotid artery widely patent to the bifurcation. Carotid bifurcation is normal without soft or calcified plaque. Cervical ICA is normal. Left carotid system: Common carotid artery widely patent to the bifurcation. Very minimal plaque at the bifurcation but no stenosis or irregularity. Cervical ICA widely patent. Vertebral arteries: Dominant right vertebral artery arises normally from the subclavian artery and is widely patent through the cervical region to the foramen magnum. As noted above, left vertebral artery is a tiny vessel that arises from the arch in actually has a second origin at the left subclavian artery, the 2 branches coming together in the transverse foramen of the lower cervical spine. Beyond that, the vessel is patent to the foramen magnum. Skeleton: Scoliosis without significant degenerative change. Other neck: No mass or lymphadenopathy. Upper chest: Emphysema without acute process. Review of the MIP images confirms the above findings CTA HEAD FINDINGS Anterior circulation: Right internal carotid artery widely patent through the skull base and siphon region. 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 Limited evaluation because of dense streak artifact the anterior and middle cerebral vessels are  patent. No large vessel occlusion or proximal stenosis. Left internal carotid arteries widely patent through the skull base and siphon region. The anterior and middle cerebral vessels are normal without aneurysm or stenosis. Posterior circulation: Dominant right vertebral artery is widely patent to the basilar artery. Right PICA shows flow. Non dominant left vertebral artery is occluded in the V4  segment. The basilar artery shows flow within both anterior inferior cerebellar arteries. There is an embolus at the basilar tip. Flow is seen within both posterior cerebral arteries. Venous sinuses: Patent and normal Anatomic variants: None significant otherwise Review of the MIP images confirms the above findings CT Brain Perfusion Findings: ASPECTS: 10 CBF (<30%) Volume: 77m Perfusion (Tmax>6.0s) volume: 034mMismatch Volume: 2m32mnfarction Location:None demonstrated IMPRESSION: 1. 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 Basilar artery shows flow proximally but there is an embolus at the basilar tip. Some flow is seen within both posterior cerebral arteries. 2. 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 Limited evaluation because of dense streak artifact. No anterior circulation large vessel occlusion or proximal stenosis. 3. No carotid bifurcation disease. 4. No evidence of infarction by CT perfusion imaging. 5. Aortic atherosclerosis. 6. Emphysema without acute process. 7. The left vertebral artery has 2 origins, 1 arising directly from the aortic arch and a second origin at the left subclavian artery, the 2 branches coming together in the transverse foramen of the lower cervical spine. Beyond that, the vessel is patent to the foramen magnum. Left V4 occlusion described above. Aortic Atherosclerosis (ICD10-I70.0) and Emphysema (ICD10-J43.9). Case discussed with Dr. StaQuinn Axering performance. Basilar tip embolic occlusion communicated at approximally 0905 hours  via Amion. Electronically Signed   By: MarNelson ChimesD.   On: 01/06/2022 09:07   CT HEAD CODE STROKE WO CONTRAST  Result Date: 01/06/2022 CLINICAL DATA:  Code stroke.  Neuro deficit, acute, stroke suspected EXAM: CT HEAD WITHOUT CONTRAST TECHNIQUE: Contiguous axial images were obtained from the base of the skull through the vertex without intravenous contrast. RADIATION DOSE REDUCTION: This exam was performed according to the departmental dose-optimization program which includes automated exposure control, adjustment of the mA and/or kV according to patient size and/or use of iterative reconstruction technique. COMPARISON:  CT 09/25/2021. FINDINGS: Brain: No evidence of acute large vascular territory infarction, hemorrhage, hydrocephalus, extra-axial collection or mass lesion/mass effect. Similar small scattered remote infarcts. Vascular: Embolization coils in the right paraclinoid ICA. Skull: No acute fracture. Sinuses/Orbits: No acute finding. Other: No mastoid effusions. ASPECTS ([facility] Early CT Score) total score (0-10 with 10 being normal): 10. IMPRESSION: 1. No evidence of acute intracranial abnormality. 2. ASPECTS is 10. Code stroke imaging results were communicated on 01/06/2022 at 8:30 am to provider [facility] secure text paging. Electronically Signed   By: FreMargaretha SheffieldD.   On: 01/06/2022 08:30   CUP PACEART REMOTE DEVICE CHECK  Result Date: 01/05/2022 ILR summary report received. Battery status OK. Normal device function. No new symptom, tachy, brady, or pause episodes. No new AF episodes. Monthly summary reports and ROV/PRN LA      Discharge Exam: Vitals:   01/18/22 0836 01/18/22 1147  BP: (!) 154/74 132/68  Pulse: 73 88  Resp: 15 16  Temp: 98 F (36.7 C) 98.2 F (36.8 C)  SpO2: 96% 99%    General: Pt is alert, awake, not in acute distress Cardiovascular: RRR, S1/S2 +, no edema Respiratory: CTA bilaterally, no wheezing, no rhonchi, no respiratory  distress, no conversational dyspnea  Abdominal: Soft, NT, ND, bowel sounds + Extremities: RLE with bruising and swelling, bandage clean and dry  Psych: Normal mood and affect, stable judgement and insight     The results of significant diagnostics from this hospitalization (including imaging, microbiology, ancillary and laboratory) are listed below for reference.     Microbiology:  Recent Results (from the past 240 hour(s))  Surgical pcr screen     Status: None   Collection Time: 01/15/22  4:13 PM   Specimen: Nasal Mucosa; Nasal Swab  Result Value Ref Range Status   MRSA, PCR NEGATIVE NEGATIVE Final   Staphylococcus aureus NEGATIVE NEGATIVE Final    Comment: (NOTE) The Xpert SA Assay (FDA approved for NASAL specimens in patients 15 years of age and older), is one component of a comprehensive surveillance program. It is not intended to diagnose infection nor to guide or monitor treatment. Performed at [place] [facility], [place] [address], [place], [place] [address]      Labs: BNP (last 3 results) No results for input(s): "BNP" in the last 8760 hours. Basic Metabolic Panel: Recent Labs  Lab 01/13/22 1417 01/14/22 0315 01/15/22 1802 01/16/22 0126  NA 143 139 135 137  K 3.5 3.5 3.9 4.0  CL 102 106  --  106  CO2 29 25  --  25  GLUCOSE 142* 111*  --  169*  BUN 11 11  --  6*  CREATININE 0.60 0.54  --  0.48  CALCIUM 9.3 8.3*  --  8.4*   Liver Function Tests: Recent Labs  Lab 01/13/22 1417 01/14/22 0315  AST 20 15  ALT 14 11  ALKPHOS 52 47  BILITOT 0.8 1.3*  PROT 6.2* 5.4*  ALBUMIN 3.4* 3.0*   No results for input(s): "LIPASE", "AMYLASE" in the last 168 hours. No results for input(s): "AMMONIA" in the last 168 hours. CBC: Recent Labs  Lab 01/13/22 1417 01/14/22 0315 01/15/22 0113 01/15/22 1802 01/16/22 0126 01/17/22 0129 01/18/22 0112  WBC 7.6 8.6 7.8  --  7.1 12.8* 9.4  NEUTROABS 5.4  --   --   --   --   --   --   HGB 8.1* 8.5* 8.2* 8.8* 9.9* 8.6* 9.4*   HCT 25.7* 26.6* 25.4* 26.0* 29.3* 25.9* 28.8*  MCV 97.7 92.4 92.4  --  89.6 92.2 93.2  PLT 235 188 189  --  208 218 231   Cardiac Enzymes: No results for input(s): "CKTOTAL", "CKMB", "CKMBINDEX", "TROPONINI" in the last 168 hours. BNP: Invalid input(s): "POCBNP" CBG: No results for input(s): "GLUCAP" in the last 168 hours.  D-Dimer No results for input(s): "DDIMER" in the last 72 hours. Hgb A1c No results for input(s): "HGBA1C" in the last 72 hours. Lipid Profile No results for input(s): "CHOL", "HDL", "LDLCALC", "TRIG", "CHOLHDL", "LDLDIRECT" in the last 72 hours. Thyroid function studies No results for input(s): "TSH", "T4TOTAL", "T3FREE", "THYROIDAB" in the last 72 hours.  Invalid input(s): "FREET3" Anemia work up No results for input(s): "VITAMINB12", "FOLATE", "FERRITIN", "TIBC", "IRON", "RETICCTPCT" in the last 72 hours. Urinalysis    Component Value Date/Time   COLORURINE STRAW (A) 01/06/2022 1749   APPEARANCEUR CLEAR 01/06/2022 1749   LABSPEC 1.032 (H) 01/06/2022 1749   PHURINE 7.0 01/06/2022 1749   GLUCOSEU NEGATIVE 01/06/2022 1749   HGBUR NEGATIVE 01/06/2022 1749   HGBUR negative 03/31/2009 1031   BILIRUBINUR NEGATIVE 01/06/2022 1749   BILIRUBINUR 1+ 06/09/2010 0000   KETONESUR 5 (A) 01/06/2022 1749   PROTEINUR NEGATIVE 01/06/2022 1749   UROBILINOGEN 0.2 06/09/2010 0000   UROBILINOGEN 0.2 03/31/2009 1031   NITRITE NEGATIVE 01/06/2022 1749   LEUKOCYTESUR NEGATIVE 01/06/2022 1749   Sepsis Labs Recent Labs  Lab 01/15/22 0113 01/16/22 0126 01/17/22 0129 01/18/22 0112  WBC 7.8 7.1 12.8* 9.4   Microbiology Recent Results (from the past 240 hour(s))  Surgical  pcr screen     Status: None   Collection Time: 01/15/22  4:13 PM   Specimen: Nasal Mucosa; Nasal Swab  Result Value Ref Range Status   MRSA, PCR NEGATIVE NEGATIVE Final   Staphylococcus aureus NEGATIVE NEGATIVE Final    Comment: (NOTE) The Xpert SA Assay (FDA approved for NASAL specimens in  patients 97 years of age and older), is one component of a comprehensive surveillance program. It is not intended to diagnose infection nor to guide or monitor treatment. Performed at [facility], [place] [address] [place], [place] [address]      Patient was seen and examined on the day of discharge and was found to be in stable condition. Time coordinating discharge: 25 minutes including assessment and coordination of care, as well as examination of the patient.   SIGNED:  Dessa Phi, DO [facility] 01/18/2022, 12:47 PM

## 2022-01-18 NOTE — Care Management Important Message (Signed)
Important Message  Patient Details  Name: MARSHAE AZAM MRN: [id number] Date of Birth: 1946/01/01   Medicare Important Message Given:  Yes     Shelda Altes 01/18/2022, 12:13 PM

## 2022-01-18 NOTE — Progress Notes (Addendum)
Occupational Therapy Treatment Patient Details Name: Cassandra Alexander MRN: [id number] DOB: 14-Apr-1945 Today's Date: 01/18/2022   History of present illness 76 y.o. female presented 01/13/22 with anemia and R groin swelling. 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 Pt obtained ultrasound with compression of pseudoaneurysm and 1 unit of blood. Of note, pt was admitted 01/06/22-01/08/22 secondary to CVA with cerebral aneurysm s/p thrombectomy 10/25 apparently via right groin. Pt also was admitted in June 2023 with ischemic CVA with petechial hemorrhage. DHR:CBULAGT, depression, TIA, cerebral aneurysm status post coiling, HTN, gastritis, dysphagia, esophageal web, GERD, partial colectomy, IBS, CVA   OT comments  Pt progressing well towards OT goals.  Completing transfers, mobility using RW, LB dressing and grooming today with supervision.  Min cueing for safety, pacing.  Spouse present and reports he helps her as needed, especially with tub transfers and IADLs.  She fatigues easily. No questions or concerns at this time, recommend HHOT then transition to outpatient OT.     Recommendations for follow up therapy are one component of a multi-disciplinary discharge planning process, led by the attending physician.  Recommendations may be updated based on patient status, additional functional criteria and insurance authorization.    Follow Up Recommendations  Home health OT (then transition to OP OT)    Assistance Recommended at Discharge Intermittent Supervision/Assistance  Patient can return home with the following  A little help with walking and/or transfers;A little help with bathing/dressing/bathroom;Direct supervision/assist for financial management;Direct supervision/assist for medications management;Assistance with cooking/housework;Assist for transportation   Equipment Recommendations  None recommended by OT    Recommendations for Other Services      Precautions / Restrictions  Precautions Precautions: Fall Restrictions Weight Bearing Restrictions: No       Mobility Bed Mobility Overal bed mobility: Modified Independent             General bed mobility comments: no assist required    Transfers                         Balance Overall balance assessment: Needs assistance Sitting-balance support: No upper extremity supported, Feet supported Sitting balance-Leahy Scale: Good     Standing balance support: Bilateral upper extremity supported, During functional activity, No upper extremity supported Standing balance-Leahy Scale: Fair Standing balance comment: dynamically relies on RW, but able to engage in ADLs without UE support                           ADL either performed or assessed with clinical judgement   ADL Overall ADL's : Needs assistance/impaired     Grooming: Supervision/safety;Wash/dry hands;Brushing hair;Oral care;Standing               Lower Body Dressing: Supervision/safety;Sit to/from stand Lower Body Dressing Details (indicate cue type and reason): to adjust socks, supervision in standing Toilet Transfer: Supervision/safety;Ambulation;Rolling walker (2 wheels)         Tub/Shower Transfer Details (indicate cue type and reason): spouse assists with tub transfers at baseline, reports no concern Functional mobility during ADLs: Supervision/safety;Rolling walker (2 wheels);Cueing for safety      Extremity/Trunk Assessment              Vision       Perception     Praxis      Cognition Arousal/Alertness: Awake/alert Behavior During Therapy: WFL for tasks assessed/performed Overall Cognitive Status: Impaired/Different from baseline Area of Impairment: Memory, Attention, Awareness,  Safety/judgement                   Current Attention Level: Alternating Memory: Decreased short-term memory, Decreased recall of precautions   Safety/Judgement: Decreased awareness of deficits,  Decreased awareness of safety Awareness: Emergent   General Comments: patient able to follow multiple step commands but demonstrates difficulty with dual cognitive tasks.  min cueing for safety        Exercises      Shoulder Instructions       General Comments VSS on RA    Pertinent Vitals/ Pain       Pain Assessment Pain Assessment: Faces Faces Pain Scale: Hurts a little bit Pain Location: R groin/thigh/knee Pain Descriptors / Indicators: Discomfort, Grimacing, Guarding Pain Intervention(s): Limited activity within patient's tolerance, Monitored during session, Repositioned  Home Living                                          Prior Functioning/Environment              Frequency  Min 2X/week        Progress Toward Goals  OT Goals(current goals can now be found in the care plan section)  Progress towards OT goals: Progressing toward goals  Acute Rehab OT Goals Patient Stated Goal: home OT Goal Formulation: With patient Time For Goal Achievement: 01/30/22 Potential to Achieve Goals: Good  Plan Discharge plan remains appropriate;Frequency remains appropriate    Co-evaluation                 AM-PAC OT "6 Clicks" Daily Activity     Outcome Measure   Help from another person eating meals?: None Help from another person taking care of personal grooming?: A Little Help from another person toileting, which includes using toliet, bedpan, or urinal?: A Little Help from another person bathing (including washing, rinsing, drying)?: A Little Help from another person to put on and taking off regular upper body clothing?: None Help from another person to put on and taking off regular lower body clothing?: A Little 6 Click Score: 20    End of Session Equipment Utilized During Treatment: Rolling walker (2 wheels)  OT Visit Diagnosis: Unsteadiness on feet (R26.81)   Activity Tolerance Patient tolerated treatment well   Patient Left in  bed;with call bell/phone within reach;with family/visitor present   Nurse Communication Mobility status        Time: 8756-4332 OT Time Calculation (min): 13 min  Charges: OT General Charges $OT Visit: 1 Visit OT Treatments $Self Care/Home Management : 8-22 mins  Jolaine Artist, OT Acute Rehabilitation Services Office [place] 01/18/2022, 10:47 AM

## 2022-01-18 NOTE — Plan of Care (Signed)
  Problem: Education: Goal: Knowledge of prescribed regimen will improve Outcome: Adequate for Discharge   Problem: Activity: Goal: Ability to tolerate increased activity will improve Outcome: Adequate for Discharge   Problem: Bowel/Gastric: Goal: Gastrointestinal status for postoperative course will improve Outcome: Adequate for Discharge   Problem: Clinical Measurements: Goal: Postoperative complications will be avoided or minimized Outcome: Adequate for Discharge Goal: Signs and symptoms of graft occlusion will improve Outcome: Adequate for Discharge   Problem: Skin Integrity: Goal: Demonstration of wound healing without infection will improve Outcome: Adequate for Discharge   Problem: Education: Goal: Knowledge of General Education information will improve Description: Including pain rating scale, medication(s)/side effects and non-pharmacologic comfort measures Outcome: Adequate for Discharge   Problem: Clinical Measurements: Goal: Ability to maintain clinical measurements within normal limits will improve Outcome: Adequate for Discharge Goal: Will remain free from infection Outcome: Adequate for Discharge Goal: Diagnostic test results will improve Outcome: Adequate for Discharge Goal: Respiratory complications will improve Outcome: Adequate for Discharge Goal: Cardiovascular complication will be avoided Outcome: Adequate for Discharge   Problem: Nutrition: Goal: Adequate nutrition will be maintained Outcome: Adequate for Discharge   Problem: Elimination: Goal: Will not experience complications related to bowel motility Outcome: Adequate for Discharge Goal: Will not experience complications related to urinary retention Outcome: Adequate for Discharge   Problem: Pain Managment: Goal: General experience of comfort will improve Outcome: Adequate for Discharge   Problem: Safety: Goal: Ability to remain free from injury will improve Outcome: Adequate for  Discharge   Problem: Skin Integrity: Goal: Risk for impaired skin integrity will decrease Outcome: Adequate for Discharge

## 2022-01-18 NOTE — Progress Notes (Signed)
Mobility Specialist Progress Note:   01/18/22 1237  Mobility  Activity Ambulated with assistance in hallway  Level of Assistance Standby assist, set-up cues, supervision of patient - no hands on  Assistive Device Front wheel walker  Distance Ambulated (ft) 250 ft  Activity Response Tolerated well  $Mobility charge 1 Mobility   Pt received in bed willing to participate in mobility. No complaints of pain. Left in bed with call bell in reach and all needs met.   [facility] Surveyor, mining Chat only

## 2022-01-20 ENCOUNTER — Telehealth: Payer: Self-pay | Admitting: Physician Assistant

## 2022-01-20 NOTE — Telephone Encounter (Signed)
-----   Message from [facility], [place] sent at 01/18/2022  9:58 AM EST ----- S/p R femoral pseudoaneurysm repair, 11/3   Follow up with PA in 2 weeks for incision check, on [place] office day

## 2022-01-21 DIAGNOSIS — T148XXA Other injury of unspecified body region, initial encounter: Secondary | ICD-10-CM | POA: Diagnosis not present

## 2022-01-21 DIAGNOSIS — D649 Anemia, unspecified: Secondary | ICD-10-CM | POA: Diagnosis not present

## 2022-01-21 DIAGNOSIS — Z8673 Personal history of transient ischemic attack (TIA), and cerebral infarction without residual deficits: Secondary | ICD-10-CM | POA: Diagnosis not present

## 2022-01-25 ENCOUNTER — Ambulatory Visit: Payer: PPO | Admitting: Physical Therapy

## 2022-01-25 NOTE — Progress Notes (Signed)
Carelink Summary Report / Loop Recorder 

## 2022-01-27 NOTE — Therapy (Signed)
OUTPATIENT PHYSICAL THERAPY NEURO EVALUATION   Patient Name: Cassandra Alexander MRN: [id number] DOB:Feb 07, 1946, 76 y.o., female Today's Date: 01/29/2022   PCP: Alroy Dust, L.Marlou Sa, MD  REFERRING PROVIDER: Garvin Fila, MD  END OF SESSION:  PT End of Session - 01/29/22 0817     Visit Number 1    Number of Visits 6    Date for PT Re-Evaluation 03/30/22   due to delay in scheduling   Authorization Type [employer]    PT Start Time 1153   pt late to eval   PT Stop Time 1232    PT Time Calculation (min) 39 min    Equipment Utilized During Treatment Gait belt    Activity Tolerance Patient tolerated treatment well    Behavior During Therapy [facility] for tasks assessed/performed             Past Medical History:  Diagnosis Date   Anxiety    Cerebral aneurysm 2006   s/p  coiling right side  ("find during an MRI for headaches")   PCOM   Depression    Diverticula of colon    Headache 07/07/2015   Hyperlipidemia    IBS (irritable bowel syndrome)    Left sided numbness 07/07/2015   Osteoarthritis    knees, back, hands, hips   Primary localized osteoarthritis of left knee 05/16/2018   Primary localized osteoarthritis of left knee 05/16/2018   Pulmonary nodule 1 cm or greater in diameter 05/20/2021   S/P left unicompartmental knee replacement 05/16/2018   TIA (transient ischemic attack) 07/07/2015   Wears glasses    Past Surgical History:  Procedure Laterality Date   ANEURYSM COILING  2006 @[facility]    right PCOM   ARTERY REPAIR Right 01/15/2022   Procedure: RIGHT SUPERFICIAL FEMORAL ARTERY PRIMARY REPAIR;  Surgeon: Broadus John, MD;  Location: [facility];  Service: Vascular;  Laterality: Right;   CESAREAN SECTION  x2  last one  1970s   COLONOSCOPY     FEMORAL ARTERY EXPLORATION Right 01/15/2022   Procedure: FEMORAL ARTERY EXPLORATION PSEUDOANEURYSM REPAIR;  Surgeon: Broadus John, MD;  Location: [facility] OR;  Service: Vascular;  Laterality: Right;   HEMATOMA EVACUATION Right 01/15/2022    Procedure: EVACUATION HEMATOMA;  Surgeon: Broadus John, MD;  Location: [facility];  Service: Vascular;  Laterality: Right;   IR CT HEAD LTD  01/06/2022   IR PERCUTANEOUS ART THROMBECTOMY/INFUSION INTRACRANIAL INC DIAG ANGIO  01/06/2022   LUMBAR FUSION  2017   --  dr Trenton Gammon per pt   PARTIAL KNEE ARTHROPLASTY Left 05/16/2018   Procedure: UNICOMPARTMENTAL KNEE;  Surgeon: Marchia Bond, MD;  Location: [facility];  Service: Orthopedics;  Laterality: Left;   PLACEMENT OF LUMBAR DRAIN Right 01/15/2022   Procedure: [place];  Surgeon: Broadus John, MD;  Location: [facility];  Service: Vascular;  Laterality: Right;   RADIOLOGY WITH ANESTHESIA N/A 01/06/2022   Procedure: IR WITH ANESTHESIA;  Surgeon: Radiologist, Medication, MD;  Location: [facility];  Service: Radiology;  Laterality: N/A;   RECTAL PROLAPSE REPAIR  2004 approx.  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   Occlusion of distal basilar artery 01/06/2022   Prediabetes 09/27/2021  Diastolic dysfunction 90/24/0973   Benign essential hypertension 09/25/2021   Chronic gastritis 09/25/2021   Dysphagia 09/25/2021   Gastroesophageal reflux disease 09/25/2021   Esophageal web 09/25/2021   History of partial colectomy 09/25/2021   Irritable bowel syndrome 09/25/2021   Acute ischemic stroke ([place]) 09/25/2021   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[place]) 09/25/2021   Primary generalized (osteo)arthritis 09/25/2021   Low back pain 09/25/2021   S/P cerebral aneurysm operation    Anxiety and depression 07/07/2015    ONSET DATE: 01/15/2022  REFERRING DIAG: Z32.992,E26.8 (ICD-10-CM) - Abnormality of gait following cerebrovascular accident (CVA)  THERAPY DIAG:   Unsteadiness on feet  Other abnormalities of gait and mobility  Muscle weakness (generalized)  Rationale for Evaluation and Treatment: Rehabilitation  SUBJECTIVE:                                                                                                                                                                                             SUBJECTIVE STATEMENT: Reports she is not hungry and has no appetite, but it is an effort. Feeling a little bit imbalanced. No falls. Now has to think before she gets up. Has one dog and will jump on her and feels like she might lose her balance. Used a RW in the hospital, but not using any device at home. Can use the stairs and hold onto the railing. Before her recent CVA, pt was ambulating with no AD and did not have any balance issues.  Feels like her walking is almost back to normal since before her CVA.  Pt accompanied by: self and husband in lobby.   PERTINENT HISTORY:  76 y.o. female presented 01/13/22 with anemia and R groin swelling. 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 Pt obtained ultrasound with compression of pseudoaneurysm and 1 unit of blood. Pt underwent hematoma evacuation, right superficial femoral artery primary repair by Dr. Virl Cagey 11/3.  Of note, pt was admitted 01/06/22-01/08/22 secondary to CVA with cerebral aneurysm s/p thrombectomy 10/25 apparently via right groin. Pt also was admitted in June 2023 with ischemic CVA with petechial hemorrhage. Discharged 01/18/22.   TMH:[id number], depression, TIA, cerebral aneurysm status post coiling, HTN, gastritis, dysphagia, esophageal web, GERD, partial colectomy, IBS, CVA   PAIN:  Are you having pain? Yes: Moving fast makes the pain worse Pain location: Anterior R thigh Pain description: Sharp Aggravating factors: Walking, moving fast Relieving factors: Can't think of anything   Vitals:   01/28/22 1210 01/28/22 1233  BP: 122/80 120/81  Pulse: 86      PRECAUTIONS: Fall  and Other: No driving  WEIGHT BEARING RESTRICTIONS: No  FALLS:  Has patient fallen in last 6 months? No  LIVING ENVIRONMENT: Lives with: lives with their spouse Lives in: House/apartment Stairs: Yes: Internal: 10 steps; can reach both, 4 STE with a railing Has following equipment at home: Walker - 2 wheeled  PLOF: Independent Husband has to bring her food  PATIENT GOALS:      OBJECTIVE:   DIAGNOSTIC FINDINGS: MRI brain 01/07/22 IMPRESSION: 1. 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 No evidence of hemorrhage. 2. Diminished flow void at the tip of the basilar artery, nonspecific, but possibly secondary to recent intervention. 3. Incomplete suppression of fluid signal on FLAIR imaging in the bilateral parietal and occipital lobes, which is favored to be artifactual.  COGNITION: Overall cognitive status: Impaired See ST eval for further details.    SENSATION: WFL  COORDINATION: Heel to shin: impaired with RLE, slower and decr ROM     POSTURE: rounded shoulders    LOWER EXTREMITY MMT:    MMT Right Eval Left Eval  Hip flexion 3+/5 4/5  Hip extension    Hip abduction    Hip adduction    Hip internal rotation    Hip external rotation    Knee flexion 5/5 5/5  Knee extension 4+/5 4+/5  Ankle dorsiflexion 5/5 5/5  Ankle plantarflexion    Ankle inversion    Ankle eversion    (Blank rows = not tested)  BED MOBILITY:  Pt reports no issues.   TRANSFERS: Assistive device utilized: None  Sit to stand: SBA Stand to sit: SBA Pt reports some discomfort in RLE afterwards   STAIRS: Level of Assistance: SBA Stair Negotiation Technique: Alternating Pattern  with Single Rail on Right Bilateral Rails Number of Stairs: 4  Height of Stairs: 6   GAIT: Gait pattern: step through pattern, decreased arm swing- Right, decreased arm swing- Left, and decreased stance time- Right Distance walked: Clinic distances  Assistive device utilized:  None Level of assistance: SBA Comments: Pt initially ambulating more slowly into slowly, but speed gradually incr with incr distances.   FUNCTIONAL TESTS:  5 times sit to stand: 15.91 seconds with no UE support  Timed up and go (TUG): 12.09 seconds with no AD  10 meter walk test: 16.09 seconds = 2.04 ft/sec    M-CTSIB  Condition 1: Firm Surface, EO 30 Sec, Normal Sway  Condition 2: Firm Surface, EC 30 Sec, Normal Sway  Condition 3: Foam Surface, EO 30 Sec, Normal Sway  Condition 4: Foam Surface, EC 25 Sec, Mild Sway     Pt reporting mild nausea after testing, BP was WNL, pt declined ginger ale or water. Pt felt better after seated rest break.    TODAY'S TREATMENT:                                                                                                                                N/A during eval.     PATIENT EDUCATION: Education details:  Clinical findings, POC, letting PCP know about lack of appetite.  Person educated: Patient and Spouse Education method: Explanation Education comprehension: verbalized understanding  HOME EXERCISE PROGRAM: Will provide at next session.   GOALS: Goals reviewed with patient? Yes  SHORT TERM GOALS: Target date: 02/26/2022    Pt will be independent with initial HEP in order to build upon functional gains made in therapy. Baseline: Goal status: INITIAL  2.  Pt will undergo further assessment of FGA with LTG written. Baseline:  Goal status: INITIAL    LONG TERM GOALS: Target date: 03/26/2022   Pt will be independent with final HEP in order to build upon functional gains made in therapy. Baseline:  Goal status: INITIAL  2.  FGA goal to be written as appropriate. Baseline:  Goal status: INITIAL  3.  Pt will improve gait speed with no to at least 2.7 ft/sec in order to demo improved community mobility.  Baseline: 16.09 seconds = 2.04 ft/sec Goal status: INITIAL  4.  Pt will improve 5x sit<>stand to less than or equal to  13 sec to demonstrate improved functional strength and transfer efficiency.  Baseline: 15.91 seconds with no UE support  Goal status: INITIAL   ASSESSMENT:  CLINICAL IMPRESSION: Patient is a 76 year old female referred to Neuro OPPT for CVA.   Pt's PMH is significant for: anxiety, depression, TIA, cerebral aneurysm status post coiling, HTN, gastritis, dysphagia, esophageal web, GERD, partial colectomy, IBS, CVA. The following deficits were present during the exam: impaired balance, gait abnormalities, decr strength, decr activity tolerance/endurance, impaired cognition. Based on 5x sit <> stand, pt is an incr risk for falls. Pt's gait speed indicates a limited community ambulator. Did not have time to perform further balance testing - will assess at next session.  Pt would benefit from skilled PT to address these impairments and functional limitations to maximize functional mobility independence and decr fall risk.    OBJECTIVE IMPAIRMENTS: Abnormal gait, decreased activity tolerance, decreased balance, decreased cognition, decreased coordination, decreased endurance, decreased strength, and postural dysfunction.   ACTIVITY LIMITATIONS: carrying, bending, stairs, transfers, and locomotion level  PARTICIPATION LIMITATIONS: meal prep, driving, and community activity  PERSONAL FACTORS: Age, Behavior pattern, Past/current experiences, Time since onset of injury/illness/exacerbation, and 3+ comorbidities: anxiety, depression, TIA, cerebral aneurysm status post coiling, HTN, gastritis, dysphagia, esophageal web, GERD, partial colectomy, IBS, CVA  are also affecting patient's functional outcome.   REHAB POTENTIAL: Good  CLINICAL DECISION MAKING: Stable/uncomplicated  EVALUATION COMPLEXITY: Low  PLAN:  PT FREQUENCY: 1x/week  PT DURATION: 8 weeks  PLANNED INTERVENTIONS: Therapeutic exercises, Therapeutic activity, Neuromuscular re-education, Balance training, Gait training, Patient/Family  education, Self Care, Stair training, Vestibular training, DME instructions, and Re-evaluation  PLAN FOR NEXT SESSION: Perform FGA and write goal. Initiate HEP for strength/balance.    Arliss Journey, PT, DPT  01/29/2022, 8:19 AM

## 2022-01-28 ENCOUNTER — Ambulatory Visit: Payer: PPO | Admitting: Physical Therapy

## 2022-01-28 ENCOUNTER — Ambulatory Visit: Payer: PPO | Attending: Family Medicine

## 2022-01-28 ENCOUNTER — Encounter: Payer: Self-pay | Admitting: Physical Therapy

## 2022-01-28 VITALS — BP 120/81 | HR 86

## 2022-01-28 DIAGNOSIS — R1310 Dysphagia, unspecified: Secondary | ICD-10-CM | POA: Diagnosis not present

## 2022-01-28 DIAGNOSIS — R4701 Aphasia: Secondary | ICD-10-CM | POA: Diagnosis not present

## 2022-01-28 DIAGNOSIS — M6281 Muscle weakness (generalized): Secondary | ICD-10-CM

## 2022-01-28 DIAGNOSIS — R278 Other lack of coordination: Secondary | ICD-10-CM | POA: Diagnosis not present

## 2022-01-28 DIAGNOSIS — R2689 Other abnormalities of gait and mobility: Secondary | ICD-10-CM | POA: Diagnosis not present

## 2022-01-28 DIAGNOSIS — R41842 Visuospatial deficit: Secondary | ICD-10-CM | POA: Insufficient documentation

## 2022-01-28 DIAGNOSIS — R2681 Unsteadiness on feet: Secondary | ICD-10-CM

## 2022-01-28 DIAGNOSIS — R41841 Cognitive communication deficit: Secondary | ICD-10-CM | POA: Diagnosis not present

## 2022-01-28 DIAGNOSIS — I69318 Other symptoms and signs involving cognitive functions following cerebral infarction: Secondary | ICD-10-CM | POA: Insufficient documentation

## 2022-01-28 NOTE — Therapy (Signed)
OUTPATIENT SPEECH LANGUAGE PATHOLOGY EVALUATION   Patient Name: Cassandra Alexander MRN: [id number] DOB:Nov 28, 1945, 76 y.o., female Today's Date: 01/28/2022  PCP: Alroy Dust, L.Marlou Sa, MD REFERRING PROVIDER: Garvin Fila, MD  END OF SESSION:  End of Session - 01/28/22 2201     Visit Number 1    Number of Visits 9    Date for SLP Re-Evaluation 03/25/22    Authorization Type [employer]    SLP Start Time 1245    SLP Stop Time  1327    SLP Time Calculation (min) 42 min    Activity Tolerance Patient tolerated treatment well;Other (comment)   verbalized desire to go home            Past Medical History:  Diagnosis Date   Anxiety    Cerebral aneurysm 2006   s/p  coiling right side  ("find during an MRI for headaches")   PCOM   Depression    Diverticula of colon    Headache 07/07/2015   Hyperlipidemia    IBS (irritable bowel syndrome)    Left sided numbness 07/07/2015   Osteoarthritis    knees, back, hands, hips   Primary localized osteoarthritis of left knee 05/16/2018   Primary localized osteoarthritis of left knee 05/16/2018   Pulmonary nodule 1 cm or greater in diameter 05/20/2021   S/P left unicompartmental knee replacement 05/16/2018   TIA (transient ischemic attack) 07/07/2015   Wears glasses    Past Surgical History:  Procedure Laterality Date   ANEURYSM COILING  2006 @[facility]    right PCOM   ARTERY REPAIR Right 01/15/2022   Procedure: RIGHT SUPERFICIAL FEMORAL ARTERY PRIMARY REPAIR;  Surgeon: Broadus John, MD;  Location: [place];  Service: Vascular;  Laterality: Right;   CESAREAN SECTION  x2  last one  1970s   COLONOSCOPY     FEMORAL ARTERY EXPLORATION Right 01/15/2022   Procedure: FEMORAL ARTERY EXPLORATION PSEUDOANEURYSM REPAIR;  Surgeon: Broadus John, MD;  Location: [facility] OR;  Service: Vascular;  Laterality: Right;   HEMATOMA EVACUATION Right 01/15/2022   Procedure: EVACUATION HEMATOMA;  Surgeon: Broadus John, MD;  Location: [place];  Service: Vascular;   Laterality: Right;   IR CT HEAD LTD  01/06/2022   IR PERCUTANEOUS ART THROMBECTOMY/INFUSION INTRACRANIAL INC DIAG ANGIO  01/06/2022   LUMBAR FUSION  2017   --  dr Trenton Gammon per pt   PARTIAL KNEE ARTHROPLASTY Left 05/16/2018   Procedure: UNICOMPARTMENTAL KNEE;  Surgeon: Marchia Bond, MD;  Location: [facility];  Service: Orthopedics;  Laterality: Left;   PLACEMENT OF LUMBAR DRAIN Right 01/15/2022   Procedure: [place];  Surgeon: Broadus John, MD;  Location: [place];  Service: Vascular;  Laterality: Right;   RADIOLOGY WITH ANESTHESIA N/A 01/06/2022   Procedure: IR WITH ANESTHESIA;  Surgeon: Radiologist, Medication, MD;  Location: [place];  Service: Radiology;  Laterality: N/A;   RECTAL PROLAPSE REPAIR  2004 approx.  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   Occlusion of distal basilar artery 01/06/2022   Prediabetes 09/98/3382   Diastolic dysfunction 50/53/9767   Benign essential hypertension 09/25/2021   Chronic gastritis 09/25/2021   Dysphagia 09/25/2021   Gastroesophageal reflux disease 09/25/2021  Esophageal web 09/25/2021   History of partial colectomy 09/25/2021   Irritable bowel syndrome 09/25/2021   Acute ischemic stroke ([place]) 09/25/2021   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 female presents to [facility] hospital on 01/06/2022 with confusion. Of note pt was admitted in June 2023 with ischemic CVA with petechial hemorrhage. CTA demonstrates occlusion non-dominant L V4 and embolus in the basilar tip. Pt underwent revascularization on 01/06/2022. PMH: anxiety, depression, TIA, cerebral aneurysm status post coiling, HTN, gastritis, dysphagia, esophageal web, GERD, partial colectomy, IBS.   PAIN: Are you having pain? No  FALLS: Has patient fallen in last 6 months?  No  LIVING ENVIRONMENT: Lives with: lives with their family Lives in: House/apartment  PLOF:  Level of assistance: Independent with ADLs, Needed assistance with IADLS Employment: Retired  PATIENT GOALS: to get back to baseline  OBJECTIVE:   COGNITION: Overall cognitive status: History of cognitive impairments - at baseline Areas of impairment:  Memory: Impaired: some prior reduced short term recall reported  Awareness: Impaired: Intellectual, Emergent, and Anticipatory Executive function: Impaired: Problem solving, Error awareness, and Slow processing Functional deficits: patient and husband denied concern for change in cognition. Pt noted to defer some responses for husband to answer, seemingly related to recall and/or communication  EXPRESSION: verbal  VERBAL EXPRESSION: Level of generative/spontaneous verbalization: conversation Automatic speech: social response: impaired  Repetition: Appears intact Naming: Responsive: 51-75% and Convergent: 51-75% Pragmatics: Impaired: flat affect, reduced desire to participate in conversation Comments: Pt noted with intermittent vague speech, deferring questions to husband, and limited responses. With additional prompting, pt exhibited intermittent anomia. Independently utilized descriptions and gestures to  aid listener understanding  Interfering components: premorbid deficit Effective technique: semantic cues, phonemic cues, and written cues Non-verbal means of communication: gestures  ORAL MOTOR EXAMINATION: Overall status: Did not assess  CLINICAL SWALLOW ASSESSMENT:   Current diet:  limited appetite and PO intake; therefore, unsure of overall diet tolerance  Dentition: adequate natural dentition Patient directly observed with POs: No (pt declined at this time) Comments: Pt denied CSE at this time. Reports intermittent coughing and "strangling" with solids and mixed consistencies (I.e., chicken noodle soup). Of note, prior esophageal dilation completed several years ago   STANDARDIZED ASSESSMENTS: [place] NAMING TEST: 10/15  PATIENT REPORTED OUTCOME MEASURES (PROM): Not completed this date   TODAY'S TREATMENT:  01-28-22: Recommended initiation of ST POC to address word retrieval to optimize communication and to further assess/address swallow function given reported decline. Pt and husband verbalized understanding and agreement.   PATIENT EDUCATION: Education details: see above Person educated: Patient and Spouse Education method: Customer service manager Education comprehension: verbalized understanding, returned demonstration, and needs further education   GOALS: Goals reviewed with patient? Yes  SHORT TERM GOALS: Target date: 02/25/2022  Pt will participate in clinical swallow evaluation in first 1-2 ST sessions (MBSS may be recommended to further evaluate pharyngeal function and assess for aspiration if s/sx exhibited/reported) Baseline: Goal status: INITIAL  2.  Pt will ID and attempt to self-correct anomia with trained strategies on structured speech tasks given occasional min A over 2 sessions  Baseline:  Goal status: INITIAL  3.  Pt will ID and attempt to self-correct anomia with trained strategies during short conversations given occasional min A over 2 sessions  Baseline:  Goal status: INITIAL  LONG TERM GOALS: Target date: 03/25/2022  Pt will consume safest and least restrictive diet with reduced s/sx of aspiration with use of trained strategies given rare min A Baseline:  Goal status: INITIAL  2.  Pt will participate in 3 outside communication exchanges with friends/family with successful carryover of anomia strategies given rare min A from husband Baseline:  Goal status: INITIAL  3.  Pt will utilize cognitive compensations as needed to optimize successful completion of targeted household tasks given occasional min A from husband Baseline:  Goal status: INITIAL   ASSESSMENT:  CLINICAL IMPRESSION: Patient is a 76 y.o. female who was seen today for CVA on 01/06/22. Some cognitive deficits reported prior to most recent stroke. Today, pt presents with intermittent anomia impacting message cohesion and pt confidence participating in more complex discourse. Intermittent attempts to compensate (gesture, description) noted but overall limited verbal output exhibited. Relied on husband to communicate majority of PMHX and observations, which could be related to pt cognition and/or communication deficits. Denied any overt concerns for cognition at this time despite husband is now managing bills and medicines (pt declined desire to return to these tasks at this time). Occasional coughing and "strangling" with solids and mixed consistencies reported since CVA. Limited PO intake and decreased appetite indicated at this time. Pt declined clinical swallow assessment this date. Pt may benefit from MBSS to evaluate pharyngeal/upper esophageal function given reports of change in swallow function. Pt would benefit from skilled ST intervention to optimize communication effectiveness and safety during swallowing.   OBJECTIVE  IMPAIRMENTS: include memory, aphasia, and dysphagia. These impairments are limiting patient from household responsibilities, effectively communicating at home and in community, and safety when swallowing. Factors affecting potential to achieve goals and functional outcome are cooperation/participation level and previous level of function. Patient will benefit from skilled SLP services to address above impairments and improve overall function.  REHAB POTENTIAL: Good  PLAN:  SLP FREQUENCY: 1-2x/week  SLP DURATION: 8 weeks  PLANNED INTERVENTIONS: Aspiration precaution training, Pharyngeal strengthening exercises, Diet toleration management , Language facilitation, Environmental controls, Cueing hierachy, Cognitive reorganization, Internal/external aids, Functional tasks, Multimodal communication approach, SLP instruction and feedback, Compensatory strategies, and Patient/family education    Marzetta Board, CCC-SLP 01/28/2022, 10:23 PM

## 2022-01-29 ENCOUNTER — Ambulatory Visit (INDEPENDENT_AMBULATORY_CARE_PROVIDER_SITE_OTHER): Payer: PPO | Admitting: Physician Assistant

## 2022-01-29 VITALS — BP 145/92 | HR 96 | Temp 98.0°F | Resp 20 | Ht 60.0 in | Wt 104.1 lb

## 2022-01-29 DIAGNOSIS — I724 Aneurysm of artery of lower extremity: Secondary | ICD-10-CM

## 2022-01-29 NOTE — Progress Notes (Signed)
POST OPERATIVE OFFICE NOTE    CC:  F/u for surgery  HPI:  This is a 76 y.o. female who  was recently admitted with a brainstem/cerebellar stroke due to basilar artery occlusion status post mechanical thrombectomy in IR via right transfemoral access by Dr. Estanislado Pandy.  She underwent US guided compression of residual right superficial femoral artery pseudoaneurysm yesterday.  Prior to discharge, she noted intense pain in the right leg with new ultrasound demonstrating reformation of the pseudoaneurysm.   She was taken to the OR and is s/p Right superficial femoral artery primary repair and hematoma evacuation on 01/15/22 by Dr. Virl Cagey. Post she had aright palpable DP pulse.   Pt returns today for follow up and incisional check.  Pt states she is ambulating with little discomfort in the right thigh from the hematoma and bruising.  She denies claudication rest pain or non healing wounds.  Her husband has been changing the thigh drain exit site dressing daily.  Minimal drainage reported.     Allergies  Allergen Reactions   Cortisone Swelling    Facial swelling Headache   Penicillins Rash    Childhood reaction Has patient had a PCN reaction causing immediate rash, facial/tongue/throat swelling, SOB or lightheadedness with hypotension: Yes Has patient had a PCN reaction causing severe rash involving mucus membranes or skin necrosis: No Has patient had a PCN reaction that required hospitalization No Has patient had a PCN reaction occurring within the last 10 years: No If all of the above answers are "NO", then may proceed with Cephalosporin use.     Current Outpatient Medications  Medication Sig Dispense Refill   aspirin 81 MG chewable tablet Chew 1 tablet (81 mg total) by mouth daily. (Patient not taking: Reported on 01/13/2022) 90 tablet 0   atorvastatin (LIPITOR) 80 MG tablet Take 1 tablet (80 mg total) by mouth daily. 30 tablet 11   citalopram (CELEXA) 10 MG tablet Take 10 mg by mouth at  bedtime.     clopidogrel (PLAVIX) 75 MG tablet Take 1 tablet (75 mg total) by mouth daily. (Patient not taking: Reported on 01/13/2022) 30 tablet 2   imipramine (TOFRANIL) 50 MG tablet Take 2 tablets by mouth at bedtime.     lisinopril (ZESTRIL) 10 MG tablet Take 10 mg by mouth daily.     mesalamine (LIALDA) 1.2 g EC tablet Take 2.4 g by mouth daily with breakfast.     nicotine polacrilex (NICORETTE) 2 MG gum Take 2 mg by mouth as needed for smoking cessation.     pantoprazole (PROTONIX) 20 MG tablet Take 20 mg by mouth daily. (Patient not taking: Reported on 01/13/2022)     No current facility-administered medications for this visit.     ROS:  See HPI  Physical Exam:    Incision:  right groin incision is well healed, drain exit site healing well  without active bloody drainage.  Thigh soft Extremities:  palpable DP pulse B LE,  Neuro: sensation intact, ambulating independently     Assessment/Plan:  This is a 76 y.o. female who is s/p:Right superficial femoral artery primary repair and hematoma evacuation on 01/15/22 by Dr. Virl Cagey.  Palpable DP pulse B LE.  Incision well healed, drain site healing well without drainage.  She has no history of arterial insufficiency.  If she develops problems or concerns of ischemic changes she will call our office f/u as needed. She is on ASA, Plavix and Lipitor for maximum medical management.     Roxy Horseman  PA-C [facility] [phone]   Clinic MD:  Virl Cagey

## 2022-01-31 NOTE — Therapy (Signed)
OUTPATIENT OCCUPATIONAL THERAPY NEURO EVALUATION  Patient Name: Cassandra Alexander MRN: [id number] DOB:[birth year], 76 y.o., female Today's Date: 02/01/2022  PCP: Alroy Dust, L. Marlou Sa, MD REFERRING PROVIDER: Garvin Fila, MD  END OF SESSION:  OT End of Session - 02/01/22 1141     Visit Number 1    Number of Visits 21    Date for OT Re-Evaluation 04/10/22    Authorization Type [employer]    Progress Note Due on Visit 10    OT Start Time 1018    OT Stop Time 1102    OT Time Calculation (min) 44 min    Activity Tolerance Patient tolerated treatment well    Behavior During Therapy [facility] for tasks assessed/performed             Past Medical History:  Diagnosis Date   Anxiety    Cerebral aneurysm 2006   s/p  coiling right side  ("find during an MRI for headaches")   PCOM   Depression    Diverticula of colon    Headache 07/07/2015   Hyperlipidemia    IBS (irritable bowel syndrome)    Left sided numbness 07/07/2015   Osteoarthritis    knees, back, hands, hips   Primary localized osteoarthritis of left knee 05/16/2018   Primary localized osteoarthritis of left knee 05/16/2018   Pulmonary nodule 1 cm or greater in diameter 05/20/2021   S/P left unicompartmental knee replacement 05/16/2018   TIA (transient ischemic attack) 07/07/2015   Wears glasses    Past Surgical History:  Procedure Laterality Date   ANEURYSM COILING  2006 @[facility]    right PCOM   ARTERY REPAIR Right 01/15/2022   Procedure: RIGHT SUPERFICIAL FEMORAL ARTERY PRIMARY REPAIR;  Surgeon: Broadus John, MD;  Location: [place];  Service: Vascular;  Laterality: Right;   CESAREAN SECTION  x2  last one  1970s   COLONOSCOPY     FEMORAL ARTERY EXPLORATION Right 01/15/2022   Procedure: FEMORAL ARTERY EXPLORATION PSEUDOANEURYSM REPAIR;  Surgeon: Broadus John, MD;  Location: [facility] OR;  Service: Vascular;  Laterality: Right;   HEMATOMA EVACUATION Right 01/15/2022   Procedure: EVACUATION HEMATOMA;  Surgeon: Broadus John, MD;   Location: [place];  Service: Vascular;  Laterality: Right;   IR CT HEAD LTD  01/06/2022   IR PERCUTANEOUS ART THROMBECTOMY/INFUSION INTRACRANIAL INC DIAG ANGIO  01/06/2022   LUMBAR FUSION  2017   --  dr Trenton Gammon per pt   PARTIAL KNEE ARTHROPLASTY Left 05/16/2018   Procedure: UNICOMPARTMENTAL KNEE;  Surgeon: Marchia Bond, MD;  Location: [facility];  Service: Orthopedics;  Laterality: Left;   PLACEMENT OF LUMBAR DRAIN Right 01/15/2022   Procedure: [place];  Surgeon: Broadus John, MD;  Location: [place];  Service: Vascular;  Laterality: Right;   RADIOLOGY WITH ANESTHESIA N/A 01/06/2022   Procedure: IR WITH ANESTHESIA;  Surgeon: Radiologist, Medication, MD;  Location: [place];  Service: Radiology;  Laterality: N/A;   RECTAL PROLAPSE REPAIR  2004 approx.  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   Occlusion of distal basilar artery 01/06/2022   Prediabetes 62/13/0865   Diastolic dysfunction 78/46/9629   Benign essential hypertension 09/25/2021   Chronic gastritis  09/25/2021   Dysphagia 09/25/2021   Gastroesophageal reflux disease 09/25/2021   Esophageal web 09/25/2021   History of partial colectomy 09/25/2021   Irritable bowel syndrome 09/25/2021   Acute ischemic stroke ([place]) 09/25/2021   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[place]) 09/25/2021   Primary generalized (osteo)arthritis 09/25/2021   Low back pain 09/25/2021   S/P cerebral aneurysm operation    Anxiety and depression 07/07/2015    ONSET DATE: 01/19/2022 (referral date)   REFERRING DIAG:  Diagnosis  I63.9 (ICD-10-CM) - CVA (cerebral vascular accident) ([place])    THERAPY DIAG:  Muscle weakness (generalized)  Other lack of  coordination  Visuospatial deficit  Other symptoms and signs involving cognitive functions following cerebral infarction  Unsteadiness on feet  Rationale for Evaluation and Treatment: Rehabilitation  SUBJECTIVE:   SUBJECTIVE STATEMENT: I was here last week Pt accompanied by: significant other  PERTINENT HISTORY: 76 y.o. female presented 01/13/22 with anemia and R groin swelling. 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 Pt obtained ultrasound with compression of pseudoaneurysm and 1 unit of blood. Pt underwent hematoma evacuation, right superficial femoral artery primary repair by Dr. Virl Cagey 11/3.  Of note, pt was admitted 01/06/22-01/08/22 secondary to CVA with cerebral aneurysm s/p thrombectomy 10/25 apparently via right groin. Pt also was admitted in June 2023 with ischemic CVA with petechial hemorrhage. Discharged 01/18/22.    UJW:[id number], depression, TIA, cerebral aneurysm status post coiling, HTN, gastritis, dysphagia, esophageal web, GERD, partial colectomy, IBS, CVA 08/2021 w/ Lt hemiparesis which mostly resolved  DIAGNOSTIC FINDINGS: MRI brain 01/07/22 IMPRESSION: 1. 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 No evidence of hemorrhage. 2. Diminished flow void at the tip of the basilar artery, nonspecific, but possibly secondary to recent intervention. 3. Incomplete suppression of fluid signal on FLAIR imaging in the bilateral parietal and occipital lobes, which is favored to be artifactual.  PRECAUTIONS: Fall and Other: No driving   WEIGHT BEARING RESTRICTIONS: No  PAIN:  Are you having pain? Yes Rt hip/upper leg - O.T. not addressing  FALLS: Has patient fallen in last 6 months? No  LIVING ENVIRONMENT: Lives with: lives with their spouse Lives in: House/apartment Stairs: Yes: Internal: 10 steps; can reach both, 4 STE with a railing Has following equipment at home: Walker - 2 wheeled  PLOF: Independent  PATIENT GOALS: take care of my dog  and wash dishes, start driving  OBJECTIVE:   HAND DOMINANCE: Right  ADLs: Transfers/ambulation related to ADLs: independent Eating: independent Grooming: mod I  UB Dressing: independent LB Dressing: mod I - slip on shoes Toileting: independent Bathing: mod I seated Tub Shower transfers: min assist w/ tub/shower combo Equipment: Shower seat with back and hand held shower  IADLs: Shopping: husband doing since CVA in June 2023 Light housekeeping: puts dishes away Meal Prep: husband cooking now, but pt had returned to cooking until second stroke in Oct 2023 Community mobility: pt currently not driving, but had returned to driving after first stroke but ? safety Medication management: husband oversees using pillbox and reminds patient Financial management: husband now doing Handwriting:  denies change  MOBILITY STATUS: Independent  ACTIVITY TOLERANCE: Activity tolerance: Pt reports she fatigues quicker than before    UPPER EXTREMITY ROM:  BUE AROM WNL's   UPPER EXTREMITY MMT:   BUE shoulder 4+/5 (slightly weaker on Lt w/ flex, slightly weaker on Rt w/ abd and ER)    HAND FUNCTION: Grip strength: Right: 38.1 lbs; Left: 44.3 lbs  COORDINATION: 9 Hole Peg test: Right:  36.41 sec; Left: 27.19 sec  SENSATION: WFL for light touch/localization  EDEMA: none   COGNITION: Overall cognitive status: Impaired, decreased memory (delayed recall 0/3), executive functioning deficits, max difficulty and errors subtracting by 3's   VISION: Subjective report: denies changes Baseline vision: Wears glasses all the time Visual history: corrective eye surgery  VISION ASSESSMENT: Ocular ROM: WFL Tracking/Visual pursuits: Requires cues, head turns, or add eye shifts to track Visual Fields: no apparent deficits and possibly slightly impaired on Rt side   PERCEPTION: Not tested but suspect some impairments   OBSERVATIONS: mild aphasia    TODAY'S TREATMENT:                                                                                                                               DATE: N/A - evaluation only   PATIENT EDUCATION: Education details: OT findings and POC Person educated: Patient and Spouse Education method: Explanation Education comprehension: verbalized understanding  HOME EXERCISE PROGRAM: N/A today   GOALS: Goals reviewed with patient? Yes  SHORT TERM GOALS: Target date: 03/03/22  Independent with HEP for Rt hand coordination and grip strength Baseline: Goal status: INITIAL  2.  Pt to perform tabletop visual scanning/processing tasks at 90% or greater accuracy Baseline:  Goal status: INITIAL  3.  Pt to improve grip strength Rt hand to 45 lbs or greater to open jars/containers Baseline: 38 lbs Goal status: INITIAL  4.  Pt to improve coordination Rt hand as evidenced by performing 9 hole peg test in 30 sec or less Baseline: 36.41 sec Goal status: INITIAL  5.  Pt to verbalize understanding with memory and cognitive strategies to increase participation in medication management, basic finances, and meal planning Baseline:  Goal status: INITIAL     LONG TERM GOALS: Target date: 04/10/22  Independent with updated HEP for RUE strength Baseline:  Goal status: INITIAL  2.  Pt to return to weekly meal planning with min cues prn Baseline:  Goal status: INITIAL  3.  Pt to cook simple meal with sup and no more than min cues Baseline:  Goal status: INITIAL  4.  Pt to return to light IADLS (laundry, consistently washing dishes and putting away) at mod I level safely Baseline:  Goal status: INITIAL  5.  Pt to perform environmental scanning w/ simple physical task at 80% accuracy or greater in prep for return to driving  Baseline:  Goal status: INITIAL  6.  Pt to perform medication management with strategies/external aids prn Baseline:  Goal status: INITIAL   ASSESSMENT:  CLINICAL IMPRESSION: Patient is a 76 y.o. female who  was seen today for occupational therapy evaluation s/p CVA 01/06/22 w/ subsequent cerebral aneurysm and evacuation and mild residual Rt hemiparesis. Pt had CVA June 2023 w/ mild Lt hemiparesis which mostly resolved per husband report. Pt presents today with decreased coordination and strength dominant RUE, decreased cognition including memory, visual/perceptual deficits, and decreased participation in [place]. Pt  would benefit from O.T. to address deficits, and maximize function to return pt to PLOF.   PERFORMANCE DEFICITS: in functional skills including ADLs, IADLs, coordination, dexterity, ROM, strength, Fine motor control, Gross motor control, mobility, endurance, decreased knowledge of precautions, and UE functional use, cognitive skills including attention, energy/drive, memory, perception, problem solving, safety awareness, and sequencing, and psychosocial skills including coping strategies.   IMPAIRMENTS: are limiting patient from ADLs, IADLs, leisure, and social participation.   CO-MORBIDITIES: may have co-morbidities  that affects occupational performance. Patient will benefit from skilled OT to address above impairments and improve overall function.  MODIFICATION OR ASSISTANCE TO COMPLETE EVALUATION: No modification of tasks or assist necessary to complete an evaluation.  OT OCCUPATIONAL PROFILE AND HISTORY: Detailed assessment: Review of records and additional review of physical, cognitive, psychosocial history related to current functional performance.  CLINICAL DECISION MAKING: Moderate - several treatment options, min-mod task modification necessary  REHAB POTENTIAL: Good  EVALUATION COMPLEXITY: Moderate    PLAN:  OT FREQUENCY: 2x/week  OT DURATION: 10 weeks, plus eval  PLANNED INTERVENTIONS: self care/ADL training, therapeutic exercise, therapeutic activity, neuromuscular re-education, manual therapy, passive range of motion, functional mobility training, patient/family  education, cognitive remediation/compensation, visual/perceptual remediation/compensation, coping strategies training, and DME and/or AE instructions  RECOMMENDED OTHER SERVICES: None at this time. Pt may benefit from neuropsychology services d/t decreased motivation/lack of interest  CONSULTED AND AGREED WITH PLAN OF CARE: Patient and family member/caregiver  PLAN FOR NEXT SESSION: coordination and putty HEP for Rt hand   Hans [place], OT 02/01/2022, 11:43 AM

## 2022-02-01 ENCOUNTER — Ambulatory Visit: Payer: PPO | Admitting: Occupational Therapy

## 2022-02-01 ENCOUNTER — Encounter: Payer: Self-pay | Admitting: Occupational Therapy

## 2022-02-01 DIAGNOSIS — I69318 Other symptoms and signs involving cognitive functions following cerebral infarction: Secondary | ICD-10-CM

## 2022-02-01 DIAGNOSIS — R278 Other lack of coordination: Secondary | ICD-10-CM

## 2022-02-01 DIAGNOSIS — R41841 Cognitive communication deficit: Secondary | ICD-10-CM | POA: Diagnosis not present

## 2022-02-01 DIAGNOSIS — R2681 Unsteadiness on feet: Secondary | ICD-10-CM

## 2022-02-01 DIAGNOSIS — M6281 Muscle weakness (generalized): Secondary | ICD-10-CM

## 2022-02-01 DIAGNOSIS — R41842 Visuospatial deficit: Secondary | ICD-10-CM

## 2022-02-08 ENCOUNTER — Ambulatory Visit (INDEPENDENT_AMBULATORY_CARE_PROVIDER_SITE_OTHER): Payer: PPO

## 2022-02-08 DIAGNOSIS — I639 Cerebral infarction, unspecified: Secondary | ICD-10-CM

## 2022-02-08 LAB — CUP PACEART REMOTE DEVICE CHECK
Date Time Interrogation Session: 20231126230630
Implantable Pulse Generator Implant Date: 20230918

## 2022-02-10 ENCOUNTER — Encounter: Payer: Self-pay | Admitting: Occupational Therapy

## 2022-02-10 ENCOUNTER — Ambulatory Visit: Payer: PPO | Admitting: Occupational Therapy

## 2022-02-10 DIAGNOSIS — R41842 Visuospatial deficit: Secondary | ICD-10-CM

## 2022-02-10 DIAGNOSIS — R41841 Cognitive communication deficit: Secondary | ICD-10-CM | POA: Diagnosis not present

## 2022-02-10 DIAGNOSIS — M6281 Muscle weakness (generalized): Secondary | ICD-10-CM

## 2022-02-10 DIAGNOSIS — R278 Other lack of coordination: Secondary | ICD-10-CM

## 2022-02-10 NOTE — Therapy (Signed)
OUTPATIENT OCCUPATIONAL THERAPY NEURO TREATMENT  Patient Name: Cassandra Alexander MRN: [id number] DOB:12-16-1945, 76 y.o., female Today's Date: 02/10/2022  PCP: Alroy Dust, L. Marlou Sa, MD REFERRING PROVIDER: Garvin Fila, MD  END OF SESSION:  OT End of Session - 02/10/22 1104     Visit Number 2    Number of Visits 21    Date for OT Re-Evaluation 04/10/22    Authorization Type [employer]    Progress Note Due on Visit 10    OT Start Time 1103    OT Stop Time 1145    OT Time Calculation (min) 42 min    Activity Tolerance Patient tolerated treatment well    Behavior During Therapy [facility] for tasks assessed/performed             Past Medical History:  Diagnosis Date   Anxiety    Cerebral aneurysm 2006   s/p  coiling right side  ("find during an MRI for headaches")   PCOM   Depression    Diverticula of colon    Headache 07/07/2015   Hyperlipidemia    IBS (irritable bowel syndrome)    Left sided numbness 07/07/2015   Osteoarthritis    knees, back, hands, hips   Primary localized osteoarthritis of left knee 05/16/2018   Primary localized osteoarthritis of left knee 05/16/2018   Pulmonary nodule 1 cm or greater in diameter 05/20/2021   S/P left unicompartmental knee replacement 05/16/2018   TIA (transient ischemic attack) 07/07/2015   Wears glasses    Past Surgical History:  Procedure Laterality Date   ANEURYSM COILING  2006 @[facility]    right PCOM   ARTERY REPAIR Right 01/15/2022   Procedure: RIGHT SUPERFICIAL FEMORAL ARTERY PRIMARY REPAIR;  Surgeon: Broadus John, MD;  Location: [place];  Service: Vascular;  Laterality: Right;   CESAREAN SECTION  x2  last one  1970s   COLONOSCOPY     FEMORAL ARTERY EXPLORATION Right 01/15/2022   Procedure: FEMORAL ARTERY EXPLORATION PSEUDOANEURYSM REPAIR;  Surgeon: Broadus John, MD;  Location: [facility] OR;  Service: Vascular;  Laterality: Right;   HEMATOMA EVACUATION Right 01/15/2022   Procedure: EVACUATION HEMATOMA;  Surgeon: Broadus John, MD;   Location: [place];  Service: Vascular;  Laterality: Right;   IR CT HEAD LTD  01/06/2022   IR PERCUTANEOUS ART THROMBECTOMY/INFUSION INTRACRANIAL INC DIAG ANGIO  01/06/2022   LUMBAR FUSION  2017   --  dr Trenton Gammon per pt   PARTIAL KNEE ARTHROPLASTY Left 05/16/2018   Procedure: UNICOMPARTMENTAL KNEE;  Surgeon: Marchia Bond, MD;  Location: [facility];  Service: Orthopedics;  Laterality: Left;   PLACEMENT OF LUMBAR DRAIN Right 01/15/2022   Procedure: [place];  Surgeon: Broadus John, MD;  Location: [place];  Service: Vascular;  Laterality: Right;   RADIOLOGY WITH ANESTHESIA N/A 01/06/2022   Procedure: IR WITH ANESTHESIA;  Surgeon: Radiologist, Medication, MD;  Location: [place];  Service: Radiology;  Laterality: N/A;   RECTAL PROLAPSE REPAIR  2004 approx.  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) 09/25/2021   Primary generalized (osteo)arthritis 09/25/2021   Low back pain 09/25/2021   S/P cerebral aneurysm operation    Anxiety and depression 07/07/2015    ONSET DATE: 01/19/2022 (referral date)   REFERRING DIAG:  Diagnosis  I63.9 (ICD-10-CM) - CVA (cerebral vascular accident) ([place])    THERAPY DIAG:  Other lack of coordination  Muscle weakness  (generalized)  Visuospatial deficit  Rationale for Evaluation and Treatment: Rehabilitation  SUBJECTIVE:   SUBJECTIVE STATEMENT: Doing better Pt accompanied by: significant other  PERTINENT HISTORY: 76 y.o. female presented 01/13/22 with anemia and R groin swelling. 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 Pt obtained ultrasound with compression of pseudoaneurysm and 1 unit of blood. Pt underwent hematoma evacuation, right superficial femoral artery primary repair by Dr. Virl Cagey 11/3.  Of note, pt was admitted 01/06/22-01/08/22 secondary to CVA with cerebral aneurysm s/p thrombectomy 10/25 apparently via right groin. Pt also was admitted in June 2023 with ischemic CVA with petechial hemorrhage. Discharged 01/18/22.    ALP:FXTKWIO, depression, TIA, cerebral aneurysm status post coiling, HTN, gastritis, dysphagia, esophageal web, GERD, partial colectomy, IBS, CVA 08/2021 w/ Lt hemiparesis which mostly resolved  DIAGNOSTIC FINDINGS: MRI brain 01/07/22 IMPRESSION: 1. 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 No evidence of hemorrhage. 2. Diminished flow void at the tip of the basilar artery, nonspecific, but possibly secondary to recent intervention. 3. Incomplete suppression of fluid signal on FLAIR imaging in the bilateral parietal and occipital lobes, which is favored to be artifactual.  PRECAUTIONS: Fall and Other: No driving   WEIGHT BEARING RESTRICTIONS: No  PAIN:  Are you having pain? No  FALLS: Has patient fallen in last 6 months? No  LIVING ENVIRONMENT: Lives with: lives with their spouse Lives in: House/apartment Stairs: Yes: Internal: 10 steps; can reach both, 4 STE with a railing Has following equipment at home: Walker - 2 wheeled  PLOF: Independent  PATIENT GOALS: take care of my dog and wash dishes, start driving  OBJECTIVE:   HAND DOMINANCE: Right  ADLs: Transfers/ambulation related to ADLs: independent Eating:  independent Grooming: mod I  UB Dressing: independent LB Dressing: mod I - slip on shoes Toileting: independent Bathing: mod I seated Tub Shower transfers: min assist w/ tub/shower combo Equipment: Shower seat with back and hand held shower  IADLs: Shopping: husband doing since CVA in June 2023 Light housekeeping: puts dishes away Meal Prep: husband cooking now, but pt had returned to cooking until second stroke in Oct 2023 Community mobility: pt currently not driving, but had returned to driving after first stroke but ? safety Medication management: husband oversees using pillbox and reminds patient Financial management: husband now doing Handwriting:  denies change  MOBILITY STATUS: Independent  ACTIVITY TOLERANCE: Activity tolerance: Pt reports she fatigues quicker than before    UPPER EXTREMITY ROM:  BUE AROM WNL's   UPPER EXTREMITY MMT:   BUE shoulder 4+/5 (slightly weaker on Lt w/ flex, slightly weaker on Rt w/ abd and ER)    HAND FUNCTION: Grip strength: Right: 38.1 lbs; Left: 44.3 lbs  COORDINATION: 9 Hole Peg test: Right: 36.41 sec; Left: 27.19 sec  SENSATION: WFL for light touch/localization  EDEMA: none   COGNITION: Overall cognitive status: Impaired, decreased memory (delayed recall  0/3), executive functioning deficits, max difficulty and errors subtracting by 3's   VISION: Subjective report: denies changes Baseline vision: Wears glasses all the time Visual history: corrective eye surgery  VISION ASSESSMENT: Ocular ROM: WFL Tracking/Visual pursuits: Requires cues, head turns, or add eye shifts to track Visual Fields: no apparent deficits and possibly slightly impaired on Rt side   PERCEPTION: Not tested but suspect some impairments   OBSERVATIONS: mild aphasia    TODAY'S TREATMENT:                                                                                                                              DATE:   See below  education  PATIENT EDUCATION: Education details: coordination and putty HEP, memory strategies Person educated: Patient and Spouse Education method: Explanation, Demonstration, Verbal cues, and Handouts Education comprehension: verbalized understanding, returned demonstration, verbal cues required, and needs further education  HOME EXERCISE PROGRAM: 02/10/22: Coordination and putty HEP, memory strategies   GOALS: Goals reviewed with patient? Yes  SHORT TERM GOALS: Target date: 03/03/22  Independent with HEP for Rt hand coordination and grip strength Baseline: Goal status: IN PROGRESS  2.  Pt to perform tabletop visual scanning/processing tasks at 90% or greater accuracy Baseline:  Goal status: INITIAL  3.  Pt to improve grip strength Rt hand to 45 lbs or greater to open jars/containers Baseline: 38 lbs Goal status: INITIAL  4.  Pt to improve coordination Rt hand as evidenced by performing 9 hole peg test in 30 sec or less Baseline: 36.41 sec Goal status: INITIAL  5.  Pt to verbalize understanding with memory and cognitive strategies to increase participation in medication management, basic finances, and meal planning Baseline:  Goal status: IN PROGRESS     LONG TERM GOALS: Target date: 04/10/22  Independent with updated HEP for RUE strength Baseline:  Goal status: INITIAL  2.  Pt to return to weekly meal planning with min cues prn Baseline:  Goal status: INITIAL  3.  Pt to cook simple meal with sup and no more than min cues Baseline:  Goal status: INITIAL  4.  Pt to return to light IADLS (laundry, consistently washing dishes and putting away) at mod I level safely Baseline:  Goal status: INITIAL  5.  Pt to perform environmental scanning w/ simple physical task at 80% accuracy or greater in prep for return to driving  Baseline:  Goal status: INITIAL  6.  Pt to perform medication management with strategies/external aids prn Baseline:  Goal status:  INITIAL   ASSESSMENT:  CLINICAL IMPRESSION: Pt progressing towards STG'S #1 and #5. Pt progressing physically, however pt with continued cognitive deficits  02/01/22: Patient is a 76 y.o. female who was seen today for occupational therapy evaluation s/p CVA 01/06/22 w/ subsequent cerebral aneurysm and evacuation and mild residual Rt hemiparesis. Pt had CVA June 2023 w/ mild Lt hemiparesis which mostly resolved per husband report. Pt presents today with decreased coordination  and strength dominant RUE, decreased cognition including memory, visual/perceptual deficits, and decreased participation in [place]. Pt would benefit from O.T. to address deficits, and maximize function to return pt to PLOF.   PERFORMANCE DEFICITS: in functional skills including ADLs, IADLs, coordination, dexterity, ROM, strength, Fine motor control, Gross motor control, mobility, endurance, decreased knowledge of precautions, and UE functional use, cognitive skills including attention, energy/drive, memory, perception, problem solving, safety awareness, and sequencing, and psychosocial skills including coping strategies.   IMPAIRMENTS: are limiting patient from ADLs, IADLs, leisure, and social participation.   CO-MORBIDITIES: may have co-morbidities  that affects occupational performance. Patient will benefit from skilled OT to address above impairments and improve overall function.  MODIFICATION OR ASSISTANCE TO COMPLETE EVALUATION: No modification of tasks or assist necessary to complete an evaluation.  OT OCCUPATIONAL PROFILE AND HISTORY: Detailed assessment: Review of records and additional review of physical, cognitive, psychosocial history related to current functional performance.  CLINICAL DECISION MAKING: Moderate - several treatment options, min-mod task modification necessary  REHAB POTENTIAL: Good  EVALUATION COMPLEXITY: Moderate    PLAN:  OT FREQUENCY: 2x/week  OT DURATION: 10 weeks, plus  eval  PLANNED INTERVENTIONS: self care/ADL training, therapeutic exercise, therapeutic activity, neuromuscular re-education, manual therapy, passive range of motion, functional mobility training, patient/family education, cognitive remediation/compensation, visual/perceptual remediation/compensation, coping strategies training, and DME and/or AE instructions  RECOMMENDED OTHER SERVICES: None at this time. Pt may benefit from neuropsychology services d/t decreased motivation/lack of interest  CONSULTED AND AGREED WITH PLAN OF CARE: Patient and family member/caregiver  PLAN FOR NEXT SESSION: review coordination and putty HEP for Rt hand prn, work on Theatre stage manager Rt hand and/or puzzle, letter cancellation for visual scanning/attention   Hans Eden, OT 02/10/2022, 11:05 AM

## 2022-02-10 NOTE — Patient Instructions (Addendum)
1. Grip Strengthening (Resistive Putty)   Squeeze putty using thumb and all fingers. Repeat _20___ times. Do __2__ sessions per day.   2. Roll putty into tube on table and pinch between first two fingers and thumb x 10 reps. Do 2 sessions per day      Coordination Activities  Perform the following activities for 10 minutes 2 times per day with right hand(s).  Rotate ball in fingertips (clockwise and counter-clockwise). Flip cards 1 at a time as fast as you can. Deal cards with your thumb (Hold deck in hand and push card off top with thumb). Rotate one card in hand (clockwise and counter-clockwise). Pick up coins, buttons, marbles, dried beans/pasta of different sizes and place in container. Pick up coins one at a time until you get 5 in your hand, then move coins from palm to fingertips to stack one at a time. Practice writing.          Memory Compensation Strategies  Use "WARM" strategy.  W= write it down  A= associate it  R= repeat it  M= make a mental note  2.   You can keep a Social worker.  Use a 3-ring notebook with sections for the following: calendar, important names and phone numbers, medications, doctors' names/phone numbers, lists/reminders, and a section to journal what you did each day.   3.    Use a calendar to write appointments down.  4.    Write yourself a schedule for the day.  This can be placed on the calendar or in a separate section of the Memory Notebook.  Keeping a regular schedule can help memory.  5.    Use medication organizer with sections for each day or morning/evening pills.  You may need help loading it  6.    Keep a basket, or pegboard by the door.  Place items that you need to take out with you in the basket or on the pegboard.  You may also want to  include a message board for reminders.  7.    Use sticky notes.  Place sticky notes with reminders in a place where the task is performed.  For example: " turn off the stove"  placed by the stove, "lock the door" placed on the door at eye level, " take your medications" on the bathroom mirror or by the place where you normally take your medications.  8.    Use alarms/timers.  Use while cooking to remind yourself to check on food or as a reminder to take your medicine, or as a reminder to make a call, or as a reminder to perform another task, etc.

## 2022-02-11 ENCOUNTER — Ambulatory Visit: Payer: PPO | Admitting: Occupational Therapy

## 2022-02-11 ENCOUNTER — Encounter: Payer: Self-pay | Admitting: Occupational Therapy

## 2022-02-11 DIAGNOSIS — R278 Other lack of coordination: Secondary | ICD-10-CM

## 2022-02-11 DIAGNOSIS — R2681 Unsteadiness on feet: Secondary | ICD-10-CM

## 2022-02-11 DIAGNOSIS — R41842 Visuospatial deficit: Secondary | ICD-10-CM

## 2022-02-11 DIAGNOSIS — M6281 Muscle weakness (generalized): Secondary | ICD-10-CM

## 2022-02-11 DIAGNOSIS — R41841 Cognitive communication deficit: Secondary | ICD-10-CM | POA: Diagnosis not present

## 2022-02-11 DIAGNOSIS — I69318 Other symptoms and signs involving cognitive functions following cerebral infarction: Secondary | ICD-10-CM

## 2022-02-11 NOTE — Therapy (Signed)
OUTPATIENT OCCUPATIONAL THERAPY NEURO TREATMENT  Patient Name: Cassandra Alexander MRN: [id number] DOB:[birth year], 76 y.o., female Today's Date: 02/10/2022  PCP: Alroy Dust, L. Marlou Sa, MD REFERRING PROVIDER: Garvin Fila, MD  END OF SESSION:  OT End of Session - 02/10/22 1104     Visit Number 2    Number of Visits 21    Date for OT Re-Evaluation 04/10/22    Authorization Type [employer]    Progress Note Due on Visit 10    OT Start Time 1103    OT Stop Time 1145    OT Time Calculation (min) 42 min    Activity Tolerance Patient tolerated treatment well    Behavior During Therapy [facility] for tasks assessed/performed             Past Medical History:  Diagnosis Date   Anxiety    Cerebral aneurysm 2006   s/p  coiling right side  ("find during an MRI for headaches")   PCOM   Depression    Diverticula of colon    Headache 07/07/2015   Hyperlipidemia    IBS (irritable bowel syndrome)    Left sided numbness 07/07/2015   Osteoarthritis    knees, back, hands, hips   Primary localized osteoarthritis of left knee 05/16/2018   Primary localized osteoarthritis of left knee 05/16/2018   Pulmonary nodule 1 cm or greater in diameter 05/20/2021   S/P left unicompartmental knee replacement 05/16/2018   TIA (transient ischemic attack) 07/07/2015   Wears glasses    Past Surgical History:  Procedure Laterality Date   ANEURYSM COILING  2006 @[facility]    right PCOM   ARTERY REPAIR Right 01/15/2022   Procedure: RIGHT SUPERFICIAL FEMORAL ARTERY PRIMARY REPAIR;  Surgeon: Broadus John, MD;  Location: [place];  Service: Vascular;  Laterality: Right;   CESAREAN SECTION  x2  last one  1970s   COLONOSCOPY     FEMORAL ARTERY EXPLORATION Right 01/15/2022   Procedure: FEMORAL ARTERY EXPLORATION PSEUDOANEURYSM REPAIR;  Surgeon: Broadus John, MD;  Location[facility];  Service: Vascular;  Laterality: Right;   HEMATOMA EVACUATION Right 01/15/2022   Procedure: EVACUATION HEMATOMA;  Surgeon: Broadus John, MD;   Location: [place];  Service: Vascular;  Laterality: Right;   IR CT HEAD LTD  01/06/2022   IR PERCUTANEOUS ART THROMBECTOMY/INFUSION INTRACRANIAL INC DIAG ANGIO  01/06/2022   LUMBAR FUSION  2017   --  dr Trenton Gammon per pt   PARTIAL KNEE ARTHROPLASTY Left 05/16/2018   Procedure: UNICOMPARTMENTAL KNEE;  Surgeon: Marchia Bond, MD;  Location: [facility];  Service: Orthopedics;  Laterality: Left;   PLACEMENT OF LUMBAR DRAIN Right 01/15/2022   Procedure: [place];  Surgeon: Broadus John, MD;  Location: [place];  Service: Vascular;  Laterality: Right;   RADIOLOGY WITH ANESTHESIA N/A 01/06/2022   Procedure: IR WITH ANESTHESIA;  Surgeon: Radiologist, Medication, MD;  Location: [place];  Service: Radiology;  Laterality: N/A;   RECTAL PROLAPSE REPAIR  2004 approx.  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   Occlusion of distal basilar artery 01/06/2022   Prediabetes 23/55/7322   Diastolic dysfunction 02/54/2706   Benign essential hypertension 09/25/2021   Chronic gastritis  09/25/2021   Dysphagia 09/25/2021   Gastroesophageal reflux disease 09/25/2021   Esophageal web 09/25/2021   History of partial colectomy 09/25/2021   Irritable bowel syndrome 09/25/2021   Acute ischemic stroke ([place]) 09/25/2021   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[place]) 09/25/2021   Primary generalized (osteo)arthritis 09/25/2021   Low back pain 09/25/2021   S/P cerebral aneurysm operation    Anxiety and depression 07/07/2015    ONSET DATE: 01/19/2022 (referral date)   REFERRING DIAG:  Diagnosis  I63.9 (ICD-10-CM) - CVA (cerebral vascular accident) ([place])    THERAPY DIAG:  Other lack of coordination  Muscle weakness  (generalized)  Visuospatial deficit  Rationale for Evaluation and Treatment: Rehabilitation  SUBJECTIVE:   SUBJECTIVE STATEMENT: Doing better Pt accompanied by: significant other  PERTINENT HISTORY: 76 y.o. female presented 01/13/22 with anemia and R groin swelling. 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 Pt obtained ultrasound with compression of pseudoaneurysm and 1 unit of blood. Pt underwent hematoma evacuation, right superficial femoral artery primary repair by Dr. Virl Cagey 11/3.  Of note, pt was admitted 01/06/22-01/08/22 secondary to CVA with cerebral aneurysm s/p thrombectomy 10/25 apparently via right groin. Pt also was admitted in June 2023 with ischemic CVA with petechial hemorrhage. Discharged 01/18/22.    VWP:VXYIAXK, depression, TIA, cerebral aneurysm status post coiling, HTN, gastritis, dysphagia, esophageal web, GERD, partial colectomy, IBS, CVA 08/2021 w/ Lt hemiparesis which mostly resolved  DIAGNOSTIC FINDINGS: MRI brain 01/07/22 IMPRESSION: 1. 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 No evidence of hemorrhage. 2. Diminished flow void at the tip of the basilar artery, nonspecific, but possibly secondary to recent intervention. 3. Incomplete suppression of fluid signal on FLAIR imaging in the bilateral parietal and occipital lobes, which is favored to be artifactual.  PRECAUTIONS: Fall and Other: No driving   WEIGHT BEARING RESTRICTIONS: No  PAIN:  Are you having pain? No  FALLS: Has patient fallen in last 6 months? No  LIVING ENVIRONMENT: Lives with: lives with their spouse Lives in: House/apartment Stairs: Yes: Internal: 10 steps; can reach both, 4 STE with a railing Has following equipment at home: Walker - 2 wheeled  PLOF: Independent  PATIENT GOALS: take care of my dog and wash dishes, start driving  OBJECTIVE:   HAND DOMINANCE: Right  ADLs: Transfers/ambulation related to ADLs: independent Eating:  independent Grooming: mod I  UB Dressing: independent LB Dressing: mod I - slip on shoes Toileting: independent Bathing: mod I seated Tub Shower transfers: min assist w/ tub/shower combo Equipment: Shower seat with back and hand held shower  IADLs: Shopping: husband doing since CVA in June 2023 Light housekeeping: puts dishes away Meal Prep: husband cooking now, but pt had returned to cooking until second stroke in Oct 2023 Community mobility: pt currently not driving, but had returned to driving after first stroke but ? safety Medication management: husband oversees using pillbox and reminds patient Financial management: husband now doing Handwriting:  denies change  MOBILITY STATUS: Independent  ACTIVITY TOLERANCE: Activity tolerance: Pt reports she fatigues quicker than before    UPPER EXTREMITY ROM:  BUE AROM WNL's   UPPER EXTREMITY MMT:   BUE shoulder 4+/5 (slightly weaker on Lt w/ flex, slightly weaker on Rt w/ abd and ER)    HAND FUNCTION: Grip strength: Right: 38.1 lbs; Left: 44.3 lbs  COORDINATION: 9 Hole Peg test: Right: 36.41 sec; Left: 27.19 sec  SENSATION: WFL for light touch/localization  EDEMA: none   COGNITION: Overall cognitive status: Impaired, decreased memory (delayed recall  0/3), executive functioning deficits, max difficulty and errors subtracting by 3's   VISION: Subjective report: denies changes Baseline vision: Wears glasses all the time Visual history: corrective eye surgery  VISION ASSESSMENT: Ocular ROM: WFL Tracking/Visual pursuits: Requires cues, head turns, or add eye shifts to track Visual Fields: no apparent deficits and possibly slightly impaired on Rt side   PERCEPTION: Not tested but suspect some impairments   OBSERVATIONS: mild aphasia    TODAY'S TREATMENT:                                                                                                                              DATE: 02/11/22 Patient here with  husband.  Patient reports not completing coordination and putty exercises. Stating - "I did not know you would test me. "  Patient shown putty exercises, but having some difficulty following specific instruction.   Patient's husband indicates that she is not showing coordination deficits at home - writing checks legibly, completing BADL, assisting with household tasks - unloading dishwasher, etc.  He implies cognition is more problematic for day to day tasks.   Worked with patient on sorting deck of cards.  Patient with deficits noted in attention to detail, and organization of information.  Patient with limited error recognition.  Patient able to improve attention to task with initial overt cueing and with time limited activity.  Patient and husband report not having taken any medication to aide with concentration.   Husband indicating he feels SLP may be of most benefit to patient and is interested in transferring services to [facility] - closer to home.  Discussed where cognition impacts function - e.g cooking, and indicated this may be a reason to continue OT as well as SLP to improve return to functional activity that is meaningful.     See below education  PATIENT EDUCATION: Education details: coordination and putty HEP, memory strategies Person educated: Patient and Spouse Education method: Explanation, Demonstration, Verbal cues, and Handouts Education comprehension: verbalized understanding, returned demonstration, verbal cues required, and needs further education  HOME EXERCISE PROGRAM: 02/10/22: Coordination and putty HEP, memory strategies   GOALS: Goals reviewed with patient? Yes  SHORT TERM GOALS: Target date: 03/03/22  Independent with HEP for Rt hand coordination and grip strength Baseline: Goal status: IN PROGRESS  2.  Pt to perform tabletop visual scanning/processing tasks at 90% or greater accuracy Baseline:  Goal status: INITIAL  3.  Pt to improve grip  strength Rt hand to 45 lbs or greater to open jars/containers Baseline: 38 lbs Goal status: INITIAL  4.  Pt to improve coordination Rt hand as evidenced by performing 9 hole peg test in 30 sec or less Baseline: 36.41 sec Goal status: INITIAL  5.  Pt to verbalize understanding with memory and cognitive strategies to increase participation in medication management, basic finances, and meal planning Baseline:  Goal status: IN PROGRESS     LONG TERM GOALS: Target date: 04/10/22  Independent with updated HEP for RUE strength Baseline:  Goal status: INITIAL  2.  Pt to return to weekly meal planning with min cues prn Baseline:  Goal status: INITIAL  3.  Pt to cook simple meal with sup and no more than min cues Baseline:  Goal status: INITIAL  4.  Pt to return to light IADLS (laundry, consistently washing dishes and putting away) at mod I level safely Baseline:  Goal status: INITIAL  5.  Pt to perform environmental scanning w/ simple physical task at 80% accuracy or greater in prep for return to driving  Baseline:  Goal status: INITIAL  6.  Pt to perform medication management with strategies/external aids prn Baseline:  Goal status: INITIAL   ASSESSMENT:  CLINICAL IMPRESSION: 11/30:  Patient reports less interest in activities she used to enjoy.  Husband reports having to take over patient's finances due to cognitive deficits.    02/01/22: Patient is a 76 y.o. female who was seen today for occupational therapy evaluation s/p CVA 01/06/22 w/ subsequent cerebral aneurysm and evacuation and mild residual Rt hemiparesis. Pt had CVA June 2023 w/ mild Lt hemiparesis which mostly resolved per husband report. Pt presents today with decreased coordination and strength dominant RUE, decreased cognition including memory, visual/perceptual deficits, and decreased participation in [place]. Pt would benefit from O.T. to address deficits, and maximize function to return pt to PLOF.    PERFORMANCE DEFICITS: in functional skills including ADLs, IADLs, coordination, dexterity, ROM, strength, Fine motor control, Gross motor control, mobility, endurance, decreased knowledge of precautions, and UE functional use, cognitive skills including attention, energy/drive, memory, perception, problem solving, safety awareness, and sequencing, and psychosocial skills including coping strategies.   IMPAIRMENTS: are limiting patient from ADLs, IADLs, leisure, and social participation.   CO-MORBIDITIES: may have co-morbidities  that affects occupational performance. Patient will benefit from skilled OT to address above impairments and improve overall function.  MODIFICATION OR ASSISTANCE TO COMPLETE EVALUATION: No modification of tasks or assist necessary to complete an evaluation.  OT OCCUPATIONAL PROFILE AND HISTORY: Detailed assessment: Review of records and additional review of physical, cognitive, psychosocial history related to current functional performance.  CLINICAL DECISION MAKING: Moderate - several treatment options, min-mod task modification necessary  REHAB POTENTIAL: Good  EVALUATION COMPLEXITY: Moderate    PLAN:  OT FREQUENCY: 2x/week  OT DURATION: 10 weeks, plus eval  PLANNED INTERVENTIONS: self care/ADL training, therapeutic exercise, therapeutic activity, neuromuscular re-education, manual therapy, passive range of motion, functional mobility training, patient/family education, cognitive remediation/compensation, visual/perceptual remediation/compensation, coping strategies training, and DME and/or AE instructions  RECOMMENDED OTHER SERVICES: None at this time. Pt may benefit from neuropsychology services d/t decreased motivation/lack of interest  CONSULTED AND AGREED WITH PLAN OF CARE: Patient and family member/caregiver  PLAN FOR NEXT SESSION: meal planning/ preparation - needs to organize info to follow a menu, review coordination and putty HEP for Rt hand  prn, work on Theatre stage manager Rt hand and/or puzzle, letter cancellation for visual scanning/attention   Antony Salmon, OTR/L 02/11/22 1:28 PM Phone: [phone] Fax: [phone]

## 2022-02-12 ENCOUNTER — Encounter: Payer: PPO | Admitting: Occupational Therapy

## 2022-02-18 ENCOUNTER — Ambulatory Visit: Payer: PPO | Admitting: Occupational Therapy

## 2022-02-18 ENCOUNTER — Ambulatory Visit: Payer: PPO

## 2022-02-18 ENCOUNTER — Ambulatory Visit: Payer: PPO | Admitting: Physical Therapy

## 2022-02-18 DIAGNOSIS — K219 Gastro-esophageal reflux disease without esophagitis: Secondary | ICD-10-CM | POA: Diagnosis not present

## 2022-02-18 DIAGNOSIS — K2950 Unspecified chronic gastritis without bleeding: Secondary | ICD-10-CM | POA: Diagnosis not present

## 2022-02-18 DIAGNOSIS — K5100 Ulcerative (chronic) pancolitis without complications: Secondary | ICD-10-CM | POA: Diagnosis not present

## 2022-02-18 DIAGNOSIS — R1312 Dysphagia, oropharyngeal phase: Secondary | ICD-10-CM | POA: Diagnosis not present

## 2022-02-18 DIAGNOSIS — E559 Vitamin D deficiency, unspecified: Secondary | ICD-10-CM | POA: Diagnosis not present

## 2022-02-18 DIAGNOSIS — Z8673 Personal history of transient ischemic attack (TIA), and cerebral infarction without residual deficits: Secondary | ICD-10-CM | POA: Diagnosis not present

## 2022-02-22 DIAGNOSIS — E559 Vitamin D deficiency, unspecified: Secondary | ICD-10-CM | POA: Diagnosis not present

## 2022-02-22 DIAGNOSIS — R7309 Other abnormal glucose: Secondary | ICD-10-CM | POA: Diagnosis not present

## 2022-02-23 ENCOUNTER — Ambulatory Visit: Payer: PPO | Admitting: Physical Therapy

## 2022-02-23 ENCOUNTER — Ambulatory Visit: Payer: PPO

## 2022-02-23 ENCOUNTER — Ambulatory Visit: Payer: PPO | Admitting: Occupational Therapy

## 2022-02-25 ENCOUNTER — Ambulatory Visit: Payer: PPO | Admitting: Occupational Therapy

## 2022-03-01 ENCOUNTER — Ambulatory Visit: Payer: PPO | Admitting: Physical Therapy

## 2022-03-01 ENCOUNTER — Ambulatory Visit: Payer: PPO | Admitting: Occupational Therapy

## 2022-03-01 ENCOUNTER — Ambulatory Visit: Payer: PPO | Admitting: Speech Pathology

## 2022-03-10 NOTE — Therapy (Signed)
OUTPATIENT PHYSICAL THERAPY NEURO TREATMENT   Patient Name: Cassandra Alexander MRN: [id number] DOB:08-03-1945, 76 y.o., female Today's Date: 01/29/2022   PCP: Alroy Dust, L.Marlou Sa, MD  REFERRING PROVIDER: Garvin Fila, MD  END OF SESSION:  PT End of Session - 01/29/22 0817     Visit Number 1    Number of Visits 6    Date for PT Re-Evaluation 03/30/22   due to delay in scheduling   Authorization Type [employer]    PT Start Time 1153   pt late to eval   PT Stop Time 1232    PT Time Calculation (min) 39 min    Equipment Utilized During Treatment Gait belt    Activity Tolerance Patient tolerated treatment well    Behavior During Therapy [facility] for tasks assessed/performed             Past Medical History:  Diagnosis Date   Anxiety    Cerebral aneurysm 2006   s/p  coiling right side  ("find during an MRI for headaches")   PCOM   Depression    Diverticula of colon    Headache 07/07/2015   Hyperlipidemia    IBS (irritable bowel syndrome)    Left sided numbness 07/07/2015   Osteoarthritis    knees, back, hands, hips   Primary localized osteoarthritis of left knee 05/16/2018   Primary localized osteoarthritis of left knee 05/16/2018   Pulmonary nodule 1 cm or greater in diameter 05/20/2021   S/P left unicompartmental knee replacement 05/16/2018   TIA (transient ischemic attack) 07/07/2015   Wears glasses    Past Surgical History:  Procedure Laterality Date   ANEURYSM COILING  2006 @[facility]    right PCOM   ARTERY REPAIR Right 01/15/2022   Procedure: RIGHT SUPERFICIAL FEMORAL ARTERY PRIMARY REPAIR;  Surgeon: Broadus John, MD;  Location: [place];  Service: Vascular;  Laterality: Right;   CESAREAN SECTION  x2  last one  1970s   COLONOSCOPY     FEMORAL ARTERY EXPLORATION Right 01/15/2022   Procedure: FEMORAL ARTERY EXPLORATION PSEUDOANEURYSM REPAIR;  Surgeon: Broadus John, MD;  Location: [facility] OR;  Service: Vascular;  Laterality: Right;   HEMATOMA EVACUATION Right 01/15/2022    Procedure: EVACUATION HEMATOMA;  Surgeon: Broadus John, MD;  Location: [place];  Service: Vascular;  Laterality: Right;   IR CT HEAD LTD  01/06/2022   IR PERCUTANEOUS ART THROMBECTOMY/INFUSION INTRACRANIAL INC DIAG ANGIO  01/06/2022   LUMBAR FUSION  2017   --  dr Trenton Gammon per pt   PARTIAL KNEE ARTHROPLASTY Left 05/16/2018   Procedure: UNICOMPARTMENTAL KNEE;  Surgeon: Marchia Bond, MD;  Location: [facility];  Service: Orthopedics;  Laterality: Left;   PLACEMENT OF LUMBAR DRAIN Right 01/15/2022   Procedure: [place];  Surgeon: Broadus John, MD;  Location: [place];  Service: Vascular;  Laterality: Right;   RADIOLOGY WITH ANESTHESIA N/A 01/06/2022   Procedure: IR WITH ANESTHESIA;  Surgeon: Radiologist, Medication, MD;  Location: [place];  Service: Radiology;  Laterality: N/A;   RECTAL PROLAPSE REPAIR  2004 approx.  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   Occlusion of distal basilar artery 01/06/2022   Prediabetes 09/27/2021  Diastolic dysfunction 84/16/6063   Benign essential hypertension 09/25/2021   Chronic gastritis 09/25/2021   Dysphagia 09/25/2021   Gastroesophageal reflux disease 09/25/2021   Esophageal web 09/25/2021   History of partial colectomy 09/25/2021   Irritable bowel syndrome 09/25/2021   Acute ischemic stroke ([place]) 09/25/2021   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[place]) 09/25/2021   Primary generalized (osteo)arthritis 09/25/2021   Low back pain 09/25/2021   S/P cerebral aneurysm operation    Anxiety and depression 07/07/2015    ONSET DATE: 01/15/2022  REFERRING DIAG: K16.010,X32.3 (ICD-10-CM) - Abnormality of gait following cerebrovascular accident (CVA)  THERAPY DIAG:   Unsteadiness on feet  Other abnormalities of gait and mobility  Muscle weakness (generalized)  Rationale for Evaluation and Treatment: Rehabilitation  SUBJECTIVE:                                                                                                                                                                                             SUBJECTIVE STATEMENT: Reports she is not hungry and has no appetite, but it is an effort. Feeling a little bit imbalanced. No falls. Now has to think before she gets up. Has one dog and will jump on her and feels like she might lose her balance. Used a RW in the hospital, but not using any device at home. Can use the stairs and hold onto the railing. Before her recent CVA, pt was ambulating with no AD and did not have any balance issues.  Feels like her walking is almost back to normal since before her CVA.  Pt accompanied by: self and husband in lobby.   PERTINENT HISTORY:  76 y.o. female presented 01/13/22 with anemia and R groin swelling. 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 Pt obtained ultrasound with compression of pseudoaneurysm and 1 unit of blood. Pt underwent hematoma evacuation, right superficial femoral artery primary repair by Dr. Virl Cagey 11/3.  Of note, pt was admitted 01/06/22-01/08/22 secondary to CVA with cerebral aneurysm s/p thrombectomy 10/25 apparently via right groin. Pt also was admitted in June 2023 with ischemic CVA with petechial hemorrhage. Discharged 01/18/22.   FTD:DUKGURK, depression, TIA, cerebral aneurysm status post coiling, HTN, gastritis, dysphagia, esophageal web, GERD, partial colectomy, IBS, CVA   PAIN:  Are you having pain? Yes: Moving fast makes the pain worse Pain location: Anterior R thigh Pain description: Sharp Aggravating factors: Walking, moving fast Relieving factors: Can't think of anything   Vitals:   01/28/22 1210 01/28/22 1233  BP: 122/80 120/81  Pulse: 86      PRECAUTIONS: Fall  and Other: No driving  WEIGHT BEARING RESTRICTIONS: No  FALLS:  Has patient fallen in last 6 months? No  LIVING ENVIRONMENT: Lives with: lives with their spouse Lives in: House/apartment Stairs: Yes: Internal: 10 steps; can reach both, 4 STE with a railing Has following equipment at home: Walker - 2 wheeled  PLOF: Independent Husband has to bring her food  PATIENT GOALS:      OBJECTIVE:                   Below measures were taken at time of initial evaluation unless otherwise specified:   DIAGNOSTIC FINDINGS: MRI brain 01/07/22 IMPRESSION: 1. 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 No evidence of hemorrhage. 2. Diminished flow void at the tip of the basilar artery, nonspecific, but possibly secondary to recent intervention. 3. Incomplete suppression of fluid signal on FLAIR imaging in the bilateral parietal and occipital lobes, which is favored to be artifactual.  COGNITION: Overall cognitive status: Impaired See ST eval for further details.    SENSATION: WFL  COORDINATION: Heel to shin: impaired with RLE, slower and decr ROM     POSTURE: rounded shoulders    LOWER EXTREMITY MMT:    MMT Right Eval Left Eval  Hip flexion 3+/5 4/5  Hip extension    Hip abduction    Hip adduction    Hip internal rotation    Hip external rotation    Knee flexion 5/5 5/5  Knee extension 4+/5 4+/5  Ankle dorsiflexion 5/5 5/5  Ankle plantarflexion    Ankle inversion    Ankle eversion    (Blank rows = not tested)  BED MOBILITY:  Pt reports no issues.   TRANSFERS: Assistive device utilized: None  Sit to stand: SBA Stand to sit: SBA Pt reports some discomfort in RLE afterwards   STAIRS: Level of Assistance: SBA Stair Negotiation Technique: Alternating Pattern  with Single Rail on Right Bilateral Rails Number of Stairs: 4  Height of Stairs: 6   GAIT: Gait pattern: step through pattern, decreased arm swing- Right,  decreased arm swing- Left, and decreased stance time- Right Distance walked: Clinic distances  Assistive device utilized: None Level of assistance: SBA Comments: Pt initially ambulating more slowly into slowly, but speed gradually incr with incr distances.   FUNCTIONAL TESTS:  5 times sit to stand: 15.91 seconds with no UE support  Timed up and go (TUG): 12.09 seconds with no AD  10 meter walk test: 16.09 seconds = 2.04 ft/sec    M-CTSIB  Condition 1: Firm Surface, EO 30 Sec, Normal Sway  Condition 2: Firm Surface, EC 30 Sec, Normal Sway  Condition 3: Foam Surface, EO 30 Sec, Normal Sway  Condition 4: Foam Surface, EC 25 Sec, Mild Sway     Pt reporting mild nausea after testing, BP was WNL, pt declined ginger ale or water. Pt felt better after seated rest break.    TODAY'S TREATMENT:  N/A during eval.     PATIENT EDUCATION: Education details: Clinical findings, POC, letting PCP know about lack of appetite.  Person educated: Patient and Spouse Education method: Explanation Education comprehension: verbalized understanding  HOME EXERCISE PROGRAM: Will provide at next session.   GOALS: Goals reviewed with patient? Yes  SHORT TERM GOALS: Target date: 02/26/2022    Pt will be independent with initial HEP in order to build upon functional gains made in therapy. Baseline: Goal status: IN PROGRESS  2.  Pt will undergo further assessment of FGA with LTG written. Baseline:  Goal status: IN PROGRESS    LONG TERM GOALS: Target date: 03/26/2022   Pt will be independent with final HEP in order to build upon functional gains made in therapy. Baseline:  Goal status: IN PROGRESS  2.  FGA goal to be written as appropriate. Baseline:  Goal status: IN PROGRESS  3.  Pt will improve gait speed with no to at least 2.7 ft/sec in order to demo improved  community mobility.  Baseline: 16.09 seconds = 2.04 ft/sec Goal status: IN PROGRESS  4.  Pt will improve 5x sit<>stand to less than or equal to 13 sec to demonstrate improved functional strength and transfer efficiency.  Baseline: 15.91 seconds with no UE support  Goal status: IN PROGRESS   ASSESSMENT:  CLINICAL IMPRESSION: Patient is a 76 year old female referred to Neuro OPPT for CVA.   Pt's PMH is significant for: anxiety, depression, TIA, cerebral aneurysm status post coiling, HTN, gastritis, dysphagia, esophageal web, GERD, partial colectomy, IBS, CVA. The following deficits were present during the exam: impaired balance, gait abnormalities, decr strength, decr activity tolerance/endurance, impaired cognition. Based on 5x sit <> stand, pt is an incr risk for falls. Pt's gait speed indicates a limited community ambulator. Did not have time to perform further balance testing - will assess at next session.  Pt would benefit from skilled PT to address these impairments and functional limitations to maximize functional mobility independence and decr fall risk.    OBJECTIVE IMPAIRMENTS: Abnormal gait, decreased activity tolerance, decreased balance, decreased cognition, decreased coordination, decreased endurance, decreased strength, and postural dysfunction.   ACTIVITY LIMITATIONS: carrying, bending, stairs, transfers, and locomotion level  PARTICIPATION LIMITATIONS: meal prep, driving, and community activity  PERSONAL FACTORS: Age, Behavior pattern, Past/current experiences, Time since onset of injury/illness/exacerbation, and 3+ comorbidities: anxiety, depression, TIA, cerebral aneurysm status post coiling, HTN, gastritis, dysphagia, esophageal web, GERD, partial colectomy, IBS, CVA  are also affecting patient's functional outcome.   REHAB POTENTIAL: Good  CLINICAL DECISION MAKING: Stable/uncomplicated  EVALUATION COMPLEXITY: Low  PLAN:  PT FREQUENCY: 1x/week  PT DURATION: 8  weeks  PLANNED INTERVENTIONS: Therapeutic exercises, Therapeutic activity, Neuromuscular re-education, Balance training, Gait training, Patient/Family education, Self Care, Stair training, Vestibular training, DME instructions, and Re-evaluation  PLAN FOR NEXT SESSION: Perform FGA and write goal. Initiate HEP for strength/balance.

## 2022-03-11 ENCOUNTER — Ambulatory Visit: Payer: PPO | Admitting: Physical Therapy

## 2022-03-11 ENCOUNTER — Encounter: Payer: PPO | Admitting: Speech Pathology

## 2022-03-11 ENCOUNTER — Ambulatory Visit: Payer: PPO

## 2022-03-11 ENCOUNTER — Encounter: Payer: Self-pay | Admitting: Physical Therapy

## 2022-03-11 ENCOUNTER — Ambulatory Visit: Payer: PPO | Attending: Family Medicine | Admitting: Occupational Therapy

## 2022-03-11 ENCOUNTER — Other Ambulatory Visit: Payer: Self-pay

## 2022-03-11 DIAGNOSIS — R4701 Aphasia: Secondary | ICD-10-CM | POA: Diagnosis not present

## 2022-03-11 DIAGNOSIS — R2681 Unsteadiness on feet: Secondary | ICD-10-CM

## 2022-03-11 DIAGNOSIS — R1310 Dysphagia, unspecified: Secondary | ICD-10-CM | POA: Insufficient documentation

## 2022-03-11 DIAGNOSIS — R41841 Cognitive communication deficit: Secondary | ICD-10-CM | POA: Insufficient documentation

## 2022-03-11 DIAGNOSIS — M6281 Muscle weakness (generalized): Secondary | ICD-10-CM | POA: Diagnosis not present

## 2022-03-11 DIAGNOSIS — R41842 Visuospatial deficit: Secondary | ICD-10-CM | POA: Diagnosis not present

## 2022-03-11 DIAGNOSIS — R2689 Other abnormalities of gait and mobility: Secondary | ICD-10-CM | POA: Diagnosis not present

## 2022-03-11 DIAGNOSIS — I69318 Other symptoms and signs involving cognitive functions following cerebral infarction: Secondary | ICD-10-CM | POA: Insufficient documentation

## 2022-03-11 DIAGNOSIS — R278 Other lack of coordination: Secondary | ICD-10-CM | POA: Diagnosis not present

## 2022-03-11 NOTE — Patient Instructions (Signed)
   I am recommending a modified barium swallow exam at [facility] or [facility] (a 30 minute test) in order to pinpoint exactly what muscles are causing you to get strangled and/or cough with meals and with liquids. Some muscles in there aren't working right. If you go for the test, they can pinpoint the muscles that aren't working right and I will know the swallowing exercises to prescribe for you.  If you do not want to go to the hospital for the test.I could just give you a full set of exercises (seven or eight), but we wouldn't know exactly what is happening in your throat when you swallow.  PLEASE let me know what you would like to do - either go for the test for your swallowing, or not go; then I will know how many exercises to give to you.  We will do some word finding assessment next time.

## 2022-03-11 NOTE — Therapy (Signed)
OUTPATIENT SPEECH LANGUAGE PATHOLOGY SWALLOW EVALUATION   Patient Name: Cassandra Alexander MRN: [id number] DOB:July 12, 1945, 76 y.o., female Today's Date: 03/12/2022  PCP: Donavan Burnet, MD REFERRING PROVIDER: Collene Leyden, MD - PCP (therapy), Royal Hawthorn, MD (initial eval)  END OF SESSION:  End of Session - 03/12/22 1024     Visit Number 2    Number of Visits 17    Date for SLP Re-Evaluation 05/11/22   modified due to first [place] 68/08/81 - new cert sent with change in visit number and cert end date to latest referring MD   Authorization Type [employer]    SLP Start Time 0933    SLP Stop Time  1017    SLP Time Calculation (min) 44 min    Activity Tolerance Patient tolerated treatment well             Past Medical History:  Diagnosis Date   Anxiety    Cerebral aneurysm 2006   s/p  coiling right side  ("find during an MRI for headaches")   PCOM   Depression    Diverticula of colon    Headache 07/07/2015   Hyperlipidemia    IBS (irritable bowel syndrome)    Left sided numbness 07/07/2015   Osteoarthritis    knees, back, hands, hips   Primary localized osteoarthritis of left knee 05/16/2018   Primary localized osteoarthritis of left knee 05/16/2018   Pulmonary nodule 1 cm or greater in diameter 05/20/2021   S/P left unicompartmental knee replacement 05/16/2018   TIA (transient ischemic attack) 07/07/2015   Wears glasses    Past Surgical History:  Procedure Laterality Date   ANEURYSM COILING  2006 @[facility]    right PCOM   ARTERY REPAIR Right 01/15/2022   Procedure: RIGHT SUPERFICIAL FEMORAL ARTERY PRIMARY REPAIR;  Surgeon: Broadus John, MD;  Location: [place];  Service: Vascular;  Laterality: Right;   CESAREAN SECTION  x2  last one  1970s   COLONOSCOPY     FEMORAL ARTERY EXPLORATION Right 01/15/2022   Procedure: FEMORAL ARTERY EXPLORATION PSEUDOANEURYSM REPAIR;  Surgeon: Broadus John, MD;  Location: [facility];  Service: Vascular;  Laterality: Right;   HEMATOMA EVACUATION Right  01/15/2022   Procedure: EVACUATION HEMATOMA;  Surgeon: Broadus John, MD;  Location: [place];  Service: Vascular;  Laterality: Right;   IR CT HEAD LTD  01/06/2022   IR PERCUTANEOUS ART THROMBECTOMY/INFUSION INTRACRANIAL INC DIAG ANGIO  01/06/2022   LUMBAR FUSION  2017   --  dr Trenton Gammon per pt   PARTIAL KNEE ARTHROPLASTY Left 05/16/2018   Procedure: UNICOMPARTMENTAL KNEE;  Surgeon: Marchia Bond, MD;  Location: [facility];  Service: Orthopedics;  Laterality: Left;   PLACEMENT OF LUMBAR DRAIN Right 01/15/2022   Procedure: [place];  Surgeon: Broadus John, MD;  Location: [place];  Service: Vascular;  Laterality: Right;   RADIOLOGY WITH ANESTHESIA N/A 01/06/2022   Procedure: IR WITH ANESTHESIA;  Surgeon: Radiologist, Medication, MD;  Location: [place];  Service: Radiology;  Laterality: N/A;   RECTAL PROLAPSE REPAIR  2004 approx.  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   Occlusion of distal basilar artery 01/06/2022   Prediabetes 09/27/2021  Diastolic dysfunction 42/35/3614   Benign essential hypertension 09/25/2021   Chronic gastritis 09/25/2021   Dysphagia 09/25/2021   Gastroesophageal reflux disease 09/25/2021   Esophageal web 09/25/2021   History of partial colectomy 09/25/2021   Irritable bowel syndrome 09/25/2021   Acute ischemic stroke ([place]) 09/25/2021   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[place]) 09/25/2021   Primary generalized (osteo)arthritis 09/25/2021   Low back pain 09/25/2021   S/P cerebral aneurysm operation    Anxiety and depression 07/07/2015    ONSET DATE: 01/06/22    REFERRING DIAG: I63.9 (ICD-10-CM) - CVA (cerebral vascular accident)   THERAPY DIAG:  Cognitive  communication deficit   Aphasia   Dysphagia, unspecified type   Rationale for Evaluation and Treatment: Rehabilitation  SUBJECTIVE:   SUBJECTIVE STATEMENT: "I can get choked on water, or on food." Pt accompanied by: self  PERTINENT HISTORY:  76 y.o. female presents to [facility] hospital on 01/06/2022 with confusion. Of note pt was admitted in June 2023 with ischemic CVA with petechial hemorrhage. CTA demonstrates occlusion non-dominant L V4 and embolus in the basilar tip. Pt underwent revascularization on 01/06/2022. PMH: anxiety, depression, TIA, cerebral aneurysm status post coiling, HTN, gastritis, dysphagia, esophageal web, GERD, partial colectomy, IBS.   PAIN:  Are you having pain? No  FALLS: Has patient fallen in last 6 months?  No  LIVING ENVIRONMENT: Lives with: lives with their family Lives in: House/apartment  PLOF:  Level of assistance: Independent with ADLs, Needed assistance with IADLS (due to memory) Employment: Retired  PATIENT GOALS: Improve function.  OBJECTIVE:   DIAGNOSTIC FINDINGS: IMPRESSION: 1. 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 No evidence of hemorrhage. 2. Diminished flow void at the tip of the basilar artery, nonspecific, but possibly secondary to recent intervention. 3. Incomplete suppression of fluid signal on FLAIR imaging in the bilateral parietal and occipital lobes, which is favored to be artifactual.  Speech-Language Eval 01/08/22: Clinical Impression   Patient presents with deficits in the areas of word finding (mild), comprehension of complex information, short term memory, problem solving, reasoning, and safety awareness. Per spouse, deficits in these areas were noted prior to this hospitalization as a result of CVA in June 2023 but are notably worse. Plans for for discharge today with 24 hour care. Recommend OP SLP services.     RECOMMENDATIONS FROM OBJECTIVE SWALLOW STUDY (MBSS/FEES):  None to  date   COGNITION: Overall cognitive status: History of cognitive impairments - at baseline Areas of impairment:  Memory: some prior reduced short term recall reported, and observed today by SLP during session Awareness: Emergent, and Anticipatory. Pt questioned what would occur if she did not choose to do MBS Executive function: Problem solving, Error awareness, and Slow processing Functional deficits: patient and husband continued to affirm pt was at baseline with cognitive function; they cont'd to deny concern for change in cognition. Today [name] was able to relate some details about her recent swallowing function to SLP when asked.   ORAL MOTOR EXAMINATION: Overall status: Impaired: Labial: Bilateral (Strength) Lingual: Bilateral (Strength and Coordination) Comments: appeared that weakness slightly more profound on lt than rt  CLINICAL SWALLOW ASSESSMENT:   Current diet: regular, Dysphagia 3 (mechanical soft), and thin liquids Dentition: adequate natural dentition Patient directly observed with POs: Yes: dysphagia 3 (soft), dysphagia 1 (puree), and thin liquids  Feeding: able to feed self Liquids provided by: cup Oral phase signs and symptoms: prolonged mastication and prolonged bolus formation Pharyngeal phase signs and symptoms: immediate throat clear, immediate cough, and  complaints of residue  PATIENT REPORTED OUTCOME MEASURES (PROM): EAT-10:   and Communication Participation Item Bank: provided in next 1-2 sessions  (Results from Speech-Language eval 01-28-22) EXPRESSION: verbal VERBAL EXPRESSION: Level of generative/spontaneous verbalization: conversation Automatic speech: social response: impaired  Repetition: Appears intact Naming: Responsive: 51-75% and Convergent: 51-75% Pragmatics: Impaired: flat affect, reduced desire to participate in conversation Comments: Pt noted with intermittent vague speech, deferring questions to husband, and limited responses. With  additional prompting, pt exhibited intermittent anomia. Independently utilized descriptions and gestures to aid listener understanding  Interfering components: premorbid deficit Effective technique: semantic cues, phonemic cues, and written cues Non-verbal means of communication: gestures  Today (03/11/22), pt's expressive language was functional with min extra time for communicating her difficulties/deficits in swallowing - simple to mod complex topic. However when asking questions about details of MBS and in communication regarding her concerns about participating in [facility] she took mod extra time to verbally formulate her thoughts. SLP may need to administer full [employer] (BNT-2) to track progress or lack thereof, of pt's expressive language skills. Husband endorses pt's verbal expression has improved since ST eval on 01-28-22.  TODAY'S TREATMENT:                                                                                                                                         DATE:  03/11/22: SLP discussed with pt that SLP diagnosed dysphagia with pt but needs to know exactly where they dysphagia is taking place and in order to do that a MBS is necessary. SLP also explained basic overview of MBS and further rationale for SLP recommending this evaluation (results would specify exercises in a HEP designed with results from [facility]). Pt appeared reticent to participate in this study, asking what would happen if she chose not to participate. SLP explained that pt's swallow function is impaired so SLP could have pt do many exercises in hopes of improving swallow function of ALL muscles used in swallowing, vs pt participating in [facility] and then tailor-making HEP to match results of her MBS.   01-28-22: (eval) Recommended initiation of ST POC to address word retrieval to optimize communication and to further assess/address swallow function given reported decline. Pt and husband verbalized  understanding and agreement.   PATIENT EDUCATION: Education details: see above in "today's treatment" Person educated: Patient Education method: Explanation and Handouts (handout provided both for pt, and for husband who was not present today) Education comprehension: verbalized understanding and needs further education   GOALS: Goals reviewed with patient? Yes   SHORT TERM GOALS: Target date: 02/25/2022   Pt will participate in clinical swallow evaluation in first 1-2 ST sessions (MBSS may be recommended to further evaluate pharyngeal function and assess for aspiration if s/sx exhibited/reported) Baseline: Goal status: Ongoing   2.  Pt will ID and attempt to self-correct anomia with trained strategies on structured speech tasks  given occasional min A over 2 sessions  Baseline:  Goal status: Ongoing   3. Pt will ID and attempt to self-correct anomia with trained strategies during short conversations given occasional min A over 2 sessions  Baseline:  Goal status: Ongoing   LONG TERM GOALS: Target date: 03/25/2022   Pt will consume safest and least restrictive diet with reduced s/sx of aspiration with use of trained strategies given rare min A Baseline:  Goal status: Ongoing   2.  Pt will participate in 3 outside communication exchanges with friends/family with successful carryover of anomia strategies given rare min A from husband Baseline:  Goal status: Ongoing   3.  Pt will utilize cognitive compensations as needed to optimize successful completion of targeted household tasks given occasional min A from husband Baseline:  Goal status: Ongoing     ASSESSMENT:   CLINICAL IMPRESSION: Patient is a 76 y.o. female who was seen today after CVA on 01/06/22. Some cognitive deficits reported prior to most recent stroke and pt/husband continue to state pt's cognition is at baseline from prior to CVA on 01/06/22. Today, pt continues to present with intermittent anomia impacting  message cohesion and pt confidence participating in more complex discourse. A clinical swallow assessment was performed this date, with MBS recommended, and that pt have consult re: upper esophageal function given reports of change in swallow function and previous esophageal dilation. Pt would benefit from skilled ST intervention to optimize communication effectiveness and safety during swallowing.    OBJECTIVE IMPAIRMENTS: include memory, aphasia, and dysphagia. These impairments are limiting patient from household responsibilities, effectively communicating at home and in community, and safety when swallowing. Factors affecting potential to achieve goals and functional outcome are cooperation/participation level and previous level of function. Patient will benefit from skilled SLP services to address above impairments and improve overall function.   REHAB POTENTIAL: Good   PLAN:   SLP FREQUENCY: 2x/week - when SLP told this to pt she stated, "I'm probably not going to like that." SLP explained rationale for frequency to pt.    SLP DURATION: 8 weeks   PLANNED INTERVENTIONS: Aspiration precaution training, Pharyngeal strengthening exercises, Diet toleration management , Language facilitation, Environmental controls, Cueing hierachy, Cognitive reorganization, Internal/external aids, Functional tasks, Multimodal communication approach, SLP instruction and feedback, Compensatory strategies, and Patient/family education   [facility], CCC-SLP 03/12/2022, 10:28 AM

## 2022-03-11 NOTE — Therapy (Addendum)
OUTPATIENT OCCUPATIONAL THERAPY NEURO TREATMENT  Patient Name: Cassandra Alexander MRN: [id number] DOB:[birth year], 76 y.o., female Today's Date: 03/11/2022  PCP: Alroy Dust, L. Marlou Sa, MD REFERRING PROVIDER: Garvin Fila, MD  END OF SESSION:  OT End of Session - 03/11/22 1104       Visit Number 4    Number of Visits 21     Date for OT Re-Evaluation 04/10/22     Authorization Type [employer]     Progress Note Due on Visit 10     OT Start Time 1023    OT Stop Time 1103    OT Time Calculation (min) 40 min     Activity Tolerance Patient tolerated treatment well     Behavior During Therapy [facility] for tasks assessed/performed      Past Medical History:  Diagnosis Date   Anxiety    Cerebral aneurysm 2006   s/p  coiling right side  ("find during an MRI for headaches")   PCOM   Depression    Diverticula of colon    Headache 07/07/2015   Hyperlipidemia    IBS (irritable bowel syndrome)    Left sided numbness 07/07/2015   Osteoarthritis    knees, back, hands, hips   Primary localized osteoarthritis of left knee 05/16/2018   Primary localized osteoarthritis of left knee 05/16/2018   Pulmonary nodule 1 cm or greater in diameter 05/20/2021   S/P left unicompartmental knee replacement 05/16/2018   TIA (transient ischemic attack) 07/07/2015   Wears glasses    Past Surgical History:  Procedure Laterality Date   ANEURYSM COILING  2006 '@[facility]'$    right PCOM   ARTERY REPAIR Right 01/15/2022   Procedure: RIGHT SUPERFICIAL FEMORAL ARTERY PRIMARY REPAIR;  Surgeon: Broadus John, MD;  Location: [place];  Service: Vascular;  Laterality: Right;   CESAREAN SECTION  x2  last one  1970s   COLONOSCOPY     FEMORAL ARTERY EXPLORATION Right 01/15/2022   Procedure: FEMORAL ARTERY EXPLORATION PSEUDOANEURYSM REPAIR;  Surgeon: Broadus John, MD;  Location[facility];  Service: Vascular;  Laterality: Right;   HEMATOMA EVACUATION Right 01/15/2022   Procedure: EVACUATION HEMATOMA;  Surgeon: Broadus John, MD;   Location: [place];  Service: Vascular;  Laterality: Right;   IR CT HEAD LTD  01/06/2022   IR PERCUTANEOUS ART THROMBECTOMY/INFUSION INTRACRANIAL INC DIAG ANGIO  01/06/2022   LUMBAR FUSION  2017   --  dr Trenton Gammon per pt   PARTIAL KNEE ARTHROPLASTY Left 05/16/2018   Procedure: UNICOMPARTMENTAL KNEE;  Surgeon: Marchia Bond, MD;  Location: [facility];  Service: Orthopedics;  Laterality: Left;   PLACEMENT OF LUMBAR DRAIN Right 01/15/2022   Procedure: [place];  Surgeon: Broadus John, MD;  Location: [place];  Service: Vascular;  Laterality: Right;   RADIOLOGY WITH ANESTHESIA N/A 01/06/2022   Procedure: IR WITH ANESTHESIA;  Surgeon: Radiologist, Medication, MD;  Location: [place];  Service: Radiology;  Laterality: N/A;   RECTAL PROLAPSE REPAIR  2004 approx.  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   Occlusion of distal basilar artery 01/06/2022   Prediabetes [id number]   Diastolic dysfunction [id number]   Benign essential hypertension 09/25/2021   Chronic  gastritis 09/25/2021   Dysphagia 09/25/2021   Gastroesophageal reflux disease 09/25/2021   Esophageal web 09/25/2021   History of partial colectomy 09/25/2021   Irritable bowel syndrome 09/25/2021   Acute ischemic stroke ([place]) 09/25/2021   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  Rationale for Evaluation and  Treatment: Rehabilitation  SUBJECTIVE:   SUBJECTIVE STATEMENT: I'll be honest, I haven't done any (in regards to putty or coordination exercises). Pt accompanied by: significant other  PERTINENT HISTORY: 76 y.o. female presented 01/13/22 with anemia and R groin swelling. 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 Pt obtained ultrasound with compression of pseudoaneurysm and 1 unit of blood. Pt underwent hematoma evacuation, right superficial femoral artery primary repair by Dr. Virl Cagey 11/3.  Of note, pt was admitted 01/06/22-01/08/22 secondary to CVA with cerebral aneurysm s/p thrombectomy 10/25 apparently via right groin. Pt also was admitted in June 2023 with ischemic CVA with petechial hemorrhage. Discharged 01/18/22.    [id number], depression, TIA, cerebral aneurysm status post coiling, HTN, gastritis, dysphagia, esophageal web, GERD, partial colectomy, IBS, CVA 08/2021 w/ Lt hemiparesis which mostly resolved  DIAGNOSTIC FINDINGS: MRI brain 01/07/22 IMPRESSION: 1. 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 No evidence of hemorrhage. 2. Diminished flow void at the tip of the basilar artery, nonspecific, but possibly secondary to recent intervention. 3. Incomplete suppression of fluid signal on FLAIR imaging in the bilateral parietal and occipital lobes, which is favored to be artifactual.  PRECAUTIONS: Fall and Other: No driving   WEIGHT BEARING RESTRICTIONS: No  PAIN:  Are you having pain? No  FALLS: Has patient fallen in last 6 months? No  PATIENT GOALS: take care of my dog and wash dishes, start driving  OBJECTIVE:   HAND DOMINANCE: Right  ADLs: Transfers/ambulation related to ADLs: independent Eating: independent Grooming: mod I  UB Dressing: independent LB Dressing: mod I - slip on shoes Toileting: independent Bathing: mod I seated Tub Shower transfers: min assist w/ tub/shower combo Equipment: Shower seat with back and hand held  shower  IADLs: Shopping: husband doing since CVA in June 2023 Light housekeeping: puts dishes away Meal Prep: husband cooking now, but pt had returned to cooking until second stroke in Oct 2023 Community mobility: pt currently not driving, but had returned to driving after first stroke but ? safety Medication management: husband oversees using pillbox and reminds patient Financial management: husband now doing Handwriting:  denies change  MOBILITY STATUS: Independent  ACTIVITY TOLERANCE: Activity tolerance: Pt reports she fatigues quicker than before   UPPER EXTREMITY MMT:   BUE shoulder 4+/5 (slightly weaker on Lt w/ flex, slightly weaker on Rt w/ abd and ER)   HAND FUNCTION: Grip strength: Right: 38.1 lbs; Left: 44.3 lbs  COORDINATION: 9 Hole Peg test: Right: 36.41 sec; Left: 27.19 sec  COGNITION: Overall cognitive status: Impaired, decreased memory (delayed recall 0/3), executive functioning deficits, max difficulty and errors subtracting by 3's   VISION ASSESSMENT: Ocular ROM: WFL Tracking/Visual pursuits: Requires cues, head turns, or add eye shifts to track Visual Fields: no apparent deficits and possibly slightly impaired on Rt side   PERCEPTION: Not tested but suspect some impairments  OBSERVATIONS: mild aphasia    TODAY'S TREATMENT:                                                               DATE:  03/11/22 Table top visual scanning: completed Trail A in 46.91 seconds with picking up pen x1.  Pt demonstrating significantly more difficulty with Trail B, terminating task in 3:07.38.  Pt providing min cues for problem solving after 7-G, however pt with increased difficulty alternating attention to continue.  Completing numbers only from 8 to 12.  OT educated on functional carryover to meal prep and cooking, requiring alternating and even divided attention to complete safely.  Pt with decreased error recognition as well. Grocery list: engaged in sorting items by  category to address organizational skills and alternating attention.  Pt requiring increased time and external aids with crossing off items, but able to complete without additional cues. Grip strength: R: 39# and L: 35# 9 hole peg test: 27.37 seconds. IADLs: pt reports that she is completing the laundry from loading to folding and putting away, loading and unloading the dishwasher.  She reports that she has done some cooking (when questioned for further info - pt said "I take that back, I don't think I have cooking anything"), but reports that her husband "seems to enjoy cooking" so she is letting him do the cooking.     02/11/22 Patient here with husband.  Patient reports not completing coordination and putty exercises. Stating - "I did not know you would test me. "  Patient shown putty exercises, but having some difficulty following specific instruction.   Patient's husband indicates that she is not showing coordination deficits at home - writing checks legibly, completing BADL, assisting with household tasks - unloading dishwasher, etc.  He implies cognition is more problematic for day to day tasks.   Worked with patient on sorting deck of cards.  Patient with deficits noted in attention to detail, and organization of information.  Patient with limited error recognition.  Patient able to improve attention to task with initial overt cueing and with time limited activity.  Patient and husband report not having taken any medication to aide with concentration.   Husband indicating he feels SLP may be of most benefit to patient and is interested in transferring services to [facility] - closer to home.  Discussed where cognition impacts function - e.g cooking, and indicated this may be a reason to continue OT as well as SLP to improve return to functional activity that is meaningful.     See below education  PATIENT EDUCATION: Education details: coordination and putty HEP, memory  strategies Person educated: Patient and Spouse Education method: Explanation, Demonstration, Verbal cues, and Handouts Education comprehension: verbalized understanding, returned demonstration, verbal cues required, and needs further education  HOME EXERCISE PROGRAM: 02/10/22: Coordination and putty HEP, memory strategies   GOALS: Goals reviewed with patient? Yes  SHORT TERM GOALS: Target date: 03/03/22  Independent with HEP for Rt hand coordination and grip strength Baseline: Goal status: MET - 03/11/22  2.  Pt to perform tabletop visual scanning/processing tasks at 90% or greater accuracy Baseline:  Goal status: NOT MET - 03/11/22  3.  Pt to improve grip strength Rt hand to 45 lbs or greater to open jars/containers Baseline:  38 lbs Goal status: NOT MET - 03/11/22  4.  Pt to improve coordination Rt hand as evidenced by performing 9 hole peg test in 30 sec or less Baseline: 36.41 sec Goal status: MET - 27.37 sec on 03/11/22 (~30 days since last seen by OT)  5.  Pt to verbalize understanding with memory and cognitive strategies to increase participation in medication management, basic finances, and meal planning Baseline:  Goal status: NOT MET - 03/11/22     LONG TERM GOALS: Target date: 04/10/22  Independent with updated HEP for RUE strength Baseline:  Goal status: IN PROGRESS  2.  Pt to return to weekly meal planning with min cues prn Baseline:  Goal status: IN PROGRESS  3.  Pt to cook simple meal with sup and no more than min cues Baseline:  Goal status: IN PROGRESS  4.  Pt to return to light IADLS (laundry, consistently washing dishes and putting away) at mod I level safely Baseline:  Goal status: IN PROGRESS  5.  Pt to perform environmental scanning w/ simple physical task at 80% accuracy or greater in prep for return to driving  Baseline:  Goal status: IN PROGRESS  6.  Pt to perform medication management with strategies/external aids prn Baseline:   Goal status: IN PROGRESS   ASSESSMENT:  CLINICAL IMPRESSION: Treatment session with focus on reviewing previous goals due to extended period since last present for OT session.  Pt demonstrating progress with coordination and minimal increase in grip strength of RUE.  Pt demonstrating difficulty with speed of cognitive processing, executive functioning, working memory, and alternating attention.  Pt will benefit from additional OT services to address dual tasking and alternating attention as required for returning to IADLs and driving.  PERFORMANCE DEFICITS: in functional skills including ADLs, IADLs, coordination, dexterity, ROM, strength, Fine motor control, Gross motor control, mobility, endurance, decreased knowledge of precautions, and UE functional use, cognitive skills including attention, energy/drive, memory, perception, problem solving, safety awareness, and sequencing, and psychosocial skills including coping strategies.   IMPAIRMENTS: are limiting patient from ADLs, IADLs, leisure, and social participation.   CO-MORBIDITIES: may have co-morbidities  that affects occupational performance. Patient will benefit from skilled OT to address above impairments and improve overall function.  MODIFICATION OR ASSISTANCE TO COMPLETE EVALUATION: No modification of tasks or assist necessary to complete an evaluation.  OT OCCUPATIONAL PROFILE AND HISTORY: Detailed assessment: Review of records and additional review of physical, cognitive, psychosocial history related to current functional performance.  CLINICAL DECISION MAKING: Moderate - several treatment options, min-mod task modification necessary  REHAB POTENTIAL: Good  EVALUATION COMPLEXITY: Moderate    PLAN:  OT FREQUENCY: 2x/week  OT DURATION: 10 weeks, plus eval  PLANNED INTERVENTIONS: self care/ADL training, therapeutic exercise, therapeutic activity, neuromuscular re-education, manual therapy, passive range of motion,  functional mobility training, patient/family education, cognitive remediation/compensation, visual/perceptual remediation/compensation, coping strategies training, and DME and/or AE instructions  RECOMMENDED OTHER SERVICES: None at this time. Pt may benefit from neuropsychology services d/t decreased motivation/lack of interest  CONSULTED AND AGREED WITH PLAN OF CARE: Patient and family member/caregiver  PLAN FOR NEXT SESSION: meal planning/ preparation - needs to organize info to follow a menu, review putty HEP for Rt hand prn, work on Theatre stage manager Rt hand and/or puzzle, letter cancellation for visual scanning/attention  Simonne Come, OTR/L 03/11/22 8:00 AM   OCCUPATIONAL THERAPY DISCHARGE SUMMARY  Visits from Start of Care: 4  Current functional level related to goals / functional outcomes: Unable to assess;  after re-assessment and session at [facility] Neuro clinic pt chose to cancel all future OT sessions.  Pt did not return after this session. Pt with improvements in coordination and grip strength.   Remaining deficits: Pt demonstrating difficulty with speed of cognitive processing, executive functioning, working memory, and alternating attention.     Education / Equipment: Coordination and putty HEP, memory strategies   Patient agrees to discharge. Patient goals were partially met. Patient is being discharged due to not returning since the last visit.Marland Kitchen    Simonne Come, OTR/L 05/19/22

## 2022-03-16 ENCOUNTER — Ambulatory Visit (INDEPENDENT_AMBULATORY_CARE_PROVIDER_SITE_OTHER): Payer: PPO

## 2022-03-16 ENCOUNTER — Ambulatory Visit: Payer: PPO | Admitting: Physical Therapy

## 2022-03-16 ENCOUNTER — Ambulatory Visit: Payer: PPO | Attending: Family Medicine

## 2022-03-16 ENCOUNTER — Encounter: Payer: PPO | Admitting: Occupational Therapy

## 2022-03-16 DIAGNOSIS — I639 Cerebral infarction, unspecified: Secondary | ICD-10-CM | POA: Diagnosis not present

## 2022-03-16 DIAGNOSIS — R4701 Aphasia: Secondary | ICD-10-CM

## 2022-03-16 DIAGNOSIS — R1310 Dysphagia, unspecified: Secondary | ICD-10-CM | POA: Diagnosis not present

## 2022-03-16 DIAGNOSIS — R41841 Cognitive communication deficit: Secondary | ICD-10-CM

## 2022-03-16 LAB — CUP PACEART REMOTE DEVICE CHECK
Date Time Interrogation Session: 20240101230445
Implantable Pulse Generator Implant Date: 20230918

## 2022-03-16 NOTE — Patient Instructions (Signed)
   Today I tested your word finding and you were within the normal limits. Average score was 43/60 and you scored 46/60. You are telling me that you are having more trouble finding words in conversation after your last stroke, so in therapy we will do some things to improve this.  I gave you some homework to work on your word finding.  Again, I would recommend twice a week for speech therapy so we can make some really good progress with your word finding and make some progress with your thinknig skills. Please think about it.   Lastly, I recommended a swallow test for you so I know what exercises you need to make you cough less, or to eliminate your coughing with eating and drinking. Please consider this too!

## 2022-03-16 NOTE — Therapy (Signed)
OUTPATIENT SPEECH LANGUAGE PATHOLOGY TREATMENT   Patient Name: Cassandra Alexander MRN: [id number] DOB:16-Jun-1945, 77 y.o., female Today's Date: 03/16/2022  PCP: Donavan Burnet, MD REFERRING PROVIDER: Collene Leyden, MD - PCP (therapy), Royal Hawthorn, MD (initial eval)  END OF SESSION:  End of Session - 03/16/22 1016     Visit Number 3    Number of Visits 17    Date for SLP Re-Evaluation 05/11/22    Authorization Type [employer]    SLP Start Time 0923    SLP Stop Time  1010    SLP Time Calculation (min) 47 min    Activity Tolerance Patient tolerated treatment well   demonstrated mild anxiety, rarely, during session             Past Medical History:  Diagnosis Date   Anxiety    Cerebral aneurysm 2006   s/p  coiling right side  ("find during an MRI for headaches")   PCOM   Depression    Diverticula of colon    Headache 07/07/2015   Hyperlipidemia    IBS (irritable bowel syndrome)    Left sided numbness 07/07/2015   Osteoarthritis    knees, back, hands, hips   Primary localized osteoarthritis of left knee 05/16/2018   Primary localized osteoarthritis of left knee 05/16/2018   Pulmonary nodule 1 cm or greater in diameter 05/20/2021   S/P left unicompartmental knee replacement 05/16/2018   TIA (transient ischemic attack) 07/07/2015   Wears glasses    Past Surgical History:  Procedure Laterality Date   ANEURYSM COILING  2006 _0    right PCOM   ARTERY REPAIR Right 01/15/2022   Procedure: RIGHT SUPERFICIAL FEMORAL ARTERY PRIMARY REPAIR;  Surgeon: Broadus John, MD;  Location: [place];  Service: Vascular;  Laterality: Right;   CESAREAN SECTION  x2  last one  1970s   COLONOSCOPY     FEMORAL ARTERY EXPLORATION Right 01/15/2022   Procedure: FEMORAL ARTERY EXPLORATION PSEUDOANEURYSM REPAIR;  Surgeon: Broadus John, MD;  Location: [facility];  Service: Vascular;  Laterality: Right;   HEMATOMA EVACUATION Right 01/15/2022   Procedure: EVACUATION HEMATOMA;  Surgeon: Broadus John, MD;   Location: [place];  Service: Vascular;  Laterality: Right;   IR CT HEAD LTD  01/06/2022   IR PERCUTANEOUS ART THROMBECTOMY/INFUSION INTRACRANIAL INC DIAG ANGIO  01/06/2022   LUMBAR FUSION  2017   --  dr Trenton Gammon per pt   PARTIAL KNEE ARTHROPLASTY Left 05/16/2018   Procedure: UNICOMPARTMENTAL KNEE;  Surgeon: Marchia Bond, MD;  Location: [facility];  Service: Orthopedics;  Laterality: Left;   PLACEMENT OF LUMBAR DRAIN Right 01/15/2022   Procedure: [place];  Surgeon: Broadus John, MD;  Location: [place];  Service: Vascular;  Laterality: Right;   RADIOLOGY WITH ANESTHESIA N/A 01/06/2022   Procedure: IR WITH ANESTHESIA;  Surgeon: Radiologist, Medication, MD;  Location: [place];  Service: Radiology;  Laterality: N/A;   RECTAL PROLAPSE REPAIR  2004 approx.  _1    TONSILLECTOMY AND ADENOIDECTOMY  age 67   TOTAL KNEE ARTHROPLASTY Right early 2000s   VAGINAL HYSTERECTOMY  eary age 71s   ovaries remain   Patient Active Problem List   Diagnosis Date Noted   Femoral artery pseudo-aneurysm, right ([place]) 01/13/2022   Respiratory insufficiency    Basilar artery occlusion 01/06/2022   Occlusion of distal basilar artery 01/06/2022   Prediabetes 14/43/1540   Diastolic dysfunction 08/67/6195   Benign essential hypertension 09/25/2021   Chronic gastritis 09/25/2021  Dysphagia 09/25/2021   Gastroesophageal reflux disease 09/25/2021   Esophageal web 09/25/2021   History of partial colectomy 09/25/2021   Irritable bowel syndrome 09/25/2021   Acute ischemic stroke ([place]) 09/25/2021   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[place]) 09/25/2021   Primary generalized (osteo)arthritis 09/25/2021   Low back pain 09/25/2021   S/P cerebral aneurysm operation    Anxiety and depression 07/07/2015    ONSET DATE: 01/06/22    REFERRING DIAG: I63.9 (ICD-10-CM) - CVA (cerebral vascular accident)   THERAPY DIAG:  Cognitive communication deficit   Aphasia   Dysphagia, unspecified type   Rationale for  Evaluation and Treatment: Rehabilitation  SUBJECTIVE:   SUBJECTIVE STATEMENT: "[name] says I can't drive but I think I can." Pt accompanied by: self  PERTINENT HISTORY:  77 y.o. female presents to [facility] hospital on 01/06/2022 with confusion. Of note pt was admitted in June 2023 with ischemic CVA with petechial hemorrhage. CTA demonstrates occlusion non-dominant L V4 and embolus in the basilar tip. Pt underwent revascularization on 01/06/2022. PMH: anxiety, depression, TIA, cerebral aneurysm status post coiling, HTN, gastritis, dysphagia, esophageal web, GERD, partial colectomy, IBS.   PAIN:  Are you having pain? No  FALLS: Has patient fallen in last 6 months?  No  LIVING ENVIRONMENT: Lives with: lives with their family Lives in: House/apartment  PLOF:  Level of assistance: Independent with ADLs, Needed assistance with IADLS (due to memory) Employment: Retired  PATIENT GOALS: Improve function.  OBJECTIVE:   DIAGNOSTIC FINDINGS: IMPRESSION: 1. 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 No evidence of hemorrhage. 2. Diminished flow void at the tip of the basilar artery, nonspecific, but possibly secondary to recent intervention. 3. Incomplete suppression of fluid signal on FLAIR imaging in the bilateral parietal and occipital lobes, which is favored to be artifactual.  Speech-Language Eval 01/08/22: Clinical Impression   Patient presents with deficits in the areas of word finding (mild), comprehension of complex information, short term memory, problem solving, reasoning, and safety awareness. Per spouse, deficits in these areas were noted prior to this hospitalization as a result of CVA in June 2023 but are notably worse. Plans for for discharge today with 24 hour care. Recommend OP SLP services.     PATIENT REPORTED OUTCOME MEASURES (PROM): EAT-10:   and Communication Participation Item Bank: provided in next 1-2 sessions  (Results from Speech-Language eval  01-28-22) EXPRESSION: verbal VERBAL EXPRESSION: Level of generative/spontaneous verbalization: conversation Automatic speech: social response: impaired  Repetition: Appears intact Naming: Responsive: 51-75% and Convergent: 51-75% Pragmatics: Impaired: flat affect, reduced desire to participate in conversation Comments: Pt noted with intermittent vague speech, deferring questions to husband, and limited responses. With additional prompting, pt exhibited intermittent anomia. Independently utilized descriptions and gestures to aid listener understanding  Interfering components: premorbid deficit Effective technique: semantic cues, phonemic cues, and written cues Non-verbal means of communication: gestures  Today (03/11/22), pt's expressive language was functional with min extra time for communicating her difficulties/deficits in swallowing - simple to mod complex topic. However when asking questions about details of MBS and in communication regarding her concerns about participating in [facility] she took mod extra time to verbally formulate her thoughts. SLP may need to administer full [employer] (BNT-2) to track progress or lack thereof, of pt's expressive language skills. Husband endorses pt's verbal expression has improved since ST eval on 01-28-22.  TODAY'S TREATMENT:  DATE:  03/16/22: SLP completed the [place] Naming Test - 2 (BNT-2) with score 46/60 (WNL), however pt states, and husband agrees, that she is having more word finding trouble after her most recent CVA. SLP provided pt some homework for word finding today with instructions for working 20-30 minutes/day on these tasks. Pt demonstrated decr'd attention and memory today. SLP used these examples to tell pt SLP agrees with husband that she should not be driving and told pt some practical examples of  what may occur if someone drives with decr'd attention and memory. Pt stated, "I think maybe I shouldn't drive." SLP reminded pt about SLP recommendation for x2/week and she has only scheduled x1/week. She stated she would consider x2/week (see "pt instructions").  03/11/22: SLP discussed with pt that SLP diagnosed dysphagia with pt but needs to know exactly where they dysphagia is taking place and in order to do that a MBS is necessary. SLP also explained basic overview of MBS and further rationale for SLP recommending this evaluation (results would specify exercises in a HEP designed with results from [facility]). Pt appeared reticent to participate in this study, asking what would happen if she chose not to participate. SLP explained that pt's swallow function is impaired so SLP could have pt do many exercises in hopes of improving swallow function of ALL muscles used in swallowing, vs pt participating in [facility] and then tailor-making HEP to match results of her MBS.   01-28-22: (eval) Recommended initiation of ST POC to address word retrieval to optimize communication and to further assess/address swallow function given reported decline. Pt and husband verbalized understanding and agreement.   PATIENT EDUCATION: Education details: see above in "today's treatment" Person educated: Patient Education method: Explanation and Handouts (handout provided both for pt, and for husband who was not present today) Education comprehension: verbalized understanding and needs further education   GOALS: Goals reviewed with patient? Yes   SHORT TERM GOALS: Target date: 02/25/2022   Pt will participate in clinical swallow evaluation in first 1-2 ST sessions (MBSS may be recommended to further evaluate pharyngeal function and assess for aspiration if s/sx exhibited/reported) Baseline: Goal status: Met   2.  Pt will ID and attempt to self-correct anomia with trained strategies on structured speech tasks given  occasional min A over 2 sessions  Baseline:  Goal status: Ongoing   3. Pt will ID and attempt to self-correct anomia with trained strategies during short conversations given occasional min A over 2 sessions  Baseline:  Goal status: Ongoing   LONG TERM GOALS: Target date: 03/25/2022   Pt will consume safest and least restrictive diet with reduced s/sx of aspiration with use of trained strategies given rare min A Baseline:  Goal status: Ongoing   2.  Pt will participate in 3 outside communication exchanges with friends/family with successful carryover of anomia strategies given rare min A from husband Baseline:  Goal status: Ongoing   3.  Pt will utilize cognitive compensations as needed to optimize successful completion of targeted household tasks given occasional min A from husband Baseline:  Goal status: Ongoing     ASSESSMENT:   CLINICAL IMPRESSION: Patient is a 77 y.o. female who was seen today after CVA on 01/06/22. Some cognitive deficits reported prior to most recent stroke and pt/husband continue to state pt's cognition is at baseline from prior to CVA on 01/06/22. Today, rduced attention and memory seen in session. [name] continues to report intermittent anomia impacting message cohesion and pt confidence participating  in more complex discourse. A  MBS recommended after previous visit and pt deciding on whether she wants this test or not. Pt would cont to benefit from skilled ST intervention to optimize communication effectiveness and safety during swallowing.    OBJECTIVE IMPAIRMENTS: include memory, aphasia, and dysphagia. These impairments are limiting patient from household responsibilities, effectively communicating at home and in community, and safety when swallowing. Factors affecting potential to achieve goals and functional outcome are cooperation/participation level and previous level of function. Patient will benefit from skilled SLP services to address above impairments  and improve overall function.   REHAB POTENTIAL: Good   PLAN:   SLP FREQUENCY: 2x/week - however pt scheduled once/week.    SLP DURATION: 8 weeks   PLANNED INTERVENTIONS: Aspiration precaution training, Pharyngeal strengthening exercises, Diet toleration management , Language facilitation, Environmental controls, Cueing hierachy, Cognitive reorganization, Internal/external aids, Functional tasks, Multimodal communication approach, SLP instruction and feedback, Compensatory strategies, and Patient/family education   [facility] [place] 03/16/2022, 10:17 AM

## 2022-03-18 ENCOUNTER — Encounter: Payer: PPO | Admitting: Occupational Therapy

## 2022-03-18 ENCOUNTER — Ambulatory Visit: Payer: PPO | Admitting: Physical Therapy

## 2022-03-18 ENCOUNTER — Ambulatory Visit: Payer: PPO | Admitting: Occupational Therapy

## 2022-03-19 NOTE — Progress Notes (Signed)
Carelink Summary Report / Loop Recorder 

## 2022-03-23 ENCOUNTER — Ambulatory Visit: Payer: PPO | Admitting: Physical Therapy

## 2022-03-23 ENCOUNTER — Encounter: Payer: PPO | Admitting: Speech Pathology

## 2022-03-23 ENCOUNTER — Encounter: Payer: PPO | Admitting: Occupational Therapy

## 2022-03-25 ENCOUNTER — Encounter: Payer: PPO | Admitting: Occupational Therapy

## 2022-03-29 NOTE — Therapy (Incomplete)
OUTPATIENT PHYSICAL THERAPY NEURO TREATMENT   Patient Name: Cassandra Alexander MRN: [id number] DOB:06-Apr-1945, 77 y.o., female Today's Date: 03/29/2022   PCP: Alroy Dust, L.Marlou Sa, MD  REFERRING PROVIDER: Collene Leyden, MD  END OF SESSION:    Past Medical History:  Diagnosis Date   Anxiety    Cerebral aneurysm 2006   s/p  coiling right side  ("find during an MRI for headaches")   PCOM   Depression    Diverticula of colon    Headache 07/07/2015   Hyperlipidemia    IBS (irritable bowel syndrome)    Left sided numbness 07/07/2015   Osteoarthritis    knees, back, hands, hips   Primary localized osteoarthritis of left knee 05/16/2018   Primary localized osteoarthritis of left knee 05/16/2018   Pulmonary nodule 1 cm or greater in diameter 05/20/2021   S/P left unicompartmental knee replacement 05/16/2018   TIA (transient ischemic attack) 07/07/2015   Wears glasses    Past Surgical History:  Procedure Laterality Date   ANEURYSM COILING  2006 '@[facility]    right PCOM   ARTERY REPAIR Right 01/15/2022   Procedure: RIGHT SUPERFICIAL FEMORAL ARTERY PRIMARY REPAIR;  Surgeon: Broadus John, MD;  Location: [facility];  Service: Vascular;  Laterality: Right;   CESAREAN SECTION  x2  last one  1970s   COLONOSCOPY     FEMORAL ARTERY EXPLORATION Right 01/15/2022   Procedure: FEMORAL ARTERY EXPLORATION PSEUDOANEURYSM REPAIR;  Surgeon: Broadus John, MD;  Location: [facility] OR;  Service: Vascular;  Laterality: Right;   HEMATOMA EVACUATION Right 01/15/2022   Procedure: EVACUATION HEMATOMA;  Surgeon: Broadus John, MD;  Location: [facility];  Service: Vascular;  Laterality: Right;   IR CT HEAD LTD  01/06/2022   IR PERCUTANEOUS ART THROMBECTOMY/INFUSION INTRACRANIAL INC DIAG ANGIO  01/06/2022   LUMBAR FUSION  2017   --  dr Trenton Gammon per pt   PARTIAL KNEE ARTHROPLASTY Left 05/16/2018   Procedure: UNICOMPARTMENTAL KNEE;  Surgeon: Marchia Bond, MD;  Location: [facility];  Service: Orthopedics;  Laterality: Left;   PLACEMENT OF LUMBAR  DRAIN Right 01/15/2022   Procedure: [place];  Surgeon: Broadus John, MD;  Location: [facility];  Service: Vascular;  Laterality: Right;   RADIOLOGY WITH ANESTHESIA N/A 01/06/2022   Procedure: IR WITH ANESTHESIA;  Surgeon: Radiologist, Medication, MD;  Location: [facility];  Service: Radiology;  Laterality: N/A;   RECTAL PROLAPSE REPAIR  2004 approx.  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   Occlusion of distal basilar artery 01/06/2022   Prediabetes 31/54/0086   Diastolic dysfunction 76/19/5093   Benign essential hypertension 09/25/2021   Chronic gastritis 09/25/2021   Dysphagia 09/25/2021   Gastroesophageal reflux disease 09/25/2021   Esophageal web 09/25/2021   History of partial colectomy 09/25/2021   Irritable bowel syndrome 09/25/2021   Acute ischemic stroke ([place]) 09/25/2021   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[place]) 09/25/2021   Primary generalized (osteo)arthritis 09/25/2021   Low back pain 09/25/2021   S/P cerebral aneurysm operation    Anxiety and depression 07/07/2015    ONSET DATE: 01/15/2022  REFERRING DIAG: I63.9 (ICD-10-CM) - Cerebral infarction, unspecified  THERAPY DIAG:  No diagnosis found.  Rationale for Evaluation and Treatment:  Rehabilitation  SUBJECTIVE:                                                                                                                                                                                             SUBJECTIVE STATEMENT: Patient reports that she has not driven yet- husband doesn't want her driving. Reports that she is doing great with her walking and balance- going up stairs by herself now. Used a walker about 3 times  then then discontinued it. Denies dizziness or headaches. Besides 1 meeting, has not been to any AA meetings which she has not fully returned to.  Reports that she has transferred to this location from [address] [facility] d/t location convenience. R LE is much less painful compared to how it was post-surgery.   Pt accompanied by: self   PERTINENT HISTORY:  77 y.o. female presented 01/13/22 with anemia and R groin swelling. 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 Pt obtained ultrasound with compression of pseudoaneurysm and 1 unit of blood. Pt underwent hematoma evacuation, right superficial femoral artery primary repair by Dr. Virl Cagey 11/3.  Of note, pt was admitted 01/06/22-01/08/22 secondary to CVA with cerebral aneurysm s/p thrombectomy 10/25 apparently via right groin. Pt also was admitted in June 2023 with ischemic CVA with petechial hemorrhage. Discharged 01/18/22.   GMW:NUUVOZD, depression, TIA, cerebral aneurysm status post coiling, HTN, gastritis, dysphagia, esophageal web, GERD, partial colectomy, IBS, CVA   PAIN:  Are you having pain? No Pain location: Anterior R thigh Pain description: Sharp Aggravating factors: Walking, moving fast Relieving factors: Can't think of anything   PRECAUTIONS: Fall and Other: No driving  WEIGHT BEARING RESTRICTIONS: No  FALLS: Has patient fallen in last 6 months? No  LIVING ENVIRONMENT: Lives with: lives with their spouse Lives in: House/apartment Stairs: Yes: Internal: 10 steps; can reach both, 4 STE with a railing Has following equipment at home: Walker - 2 wheeled  PLOF: Independent Husband has to bring her food  PATIENT GOALS: "I just want to start driving."     OBJECTIVE:      TODAY'S TREATMENT: 03/30/22 Activity Comments                       HOME EXERCISE PROGRAM Last updated: 03/11/22 Access Code: [email or web address] URL: [email or web address] Date: 03/11/2022 Prepared by: [facility]  Exercises - Sit to Stand Without Arm Support  - 1 x daily - 5 x weekly - 2 sets - 10 reps - Standing Hip Abduction with Counter Support  - 1 x daily - 5 x weekly -  2 sets - 10 reps - Tandem Walking with Counter Support  - 1 x daily - 5 x weekly - 2 sets - 10 reps - Standing March with Counter Support  - 1 x daily - 5 x weekly - 2 sets - 10 reps     Below measures were taken at time of initial evaluation unless otherwise specified:  DIAGNOSTIC FINDINGS: MRI brain 01/07/22 IMPRESSION: 1. 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 No evidence of hemorrhage. 2. Diminished flow void at the tip of the basilar artery, nonspecific, but possibly secondary to recent intervention. 3. Incomplete suppression of fluid signal on FLAIR imaging in the bilateral parietal and occipital lobes, which is favored to be artifactual.  COGNITION: Overall cognitive status: Impaired See ST eval for further details.    SENSATION: WFL  COORDINATION: Heel to shin: impaired with RLE, slower and decr ROM     POSTURE: rounded shoulders      LOWER EXTREMITY MMT:    MMT (in sitting) Right Eval Left Eval Right 03/11/22 Left 03/11/22  Hip flexion 3+/5 4/'5 4 4  '$ Hip extension      Hip abduction   4+ 4  Hip adduction   4+ 4+  Hip internal rotation      Hip external rotation      Knee flexion 5/5 5/5 4+ 4+  Knee extension 4+/5 4+/'5 5 5  '$ Ankle dorsiflexion 5/5 5/5 4+ 4+  Ankle plantarflexion   4 4+  Ankle inversion      Ankle eversion      (Blank rows = not tested)  BED MOBILITY:  Pt reports no issues.   TRANSFERS: Assistive device utilized: None  Sit to stand: SBA Stand to sit: SBA Pt reports some discomfort in RLE afterwards   STAIRS: Level of Assistance: SBA Stair Negotiation Technique: Alternating Pattern  with Single Rail on Right Bilateral Rails Number of Stairs: 4  Height of Stairs: 6   GAIT: Gait pattern: step through pattern, decreased  arm swing- Right, decreased arm swing- Left, and decreased stance time- Right Distance walked: Clinic distances  Assistive device utilized: None Level of assistance: SBA Comments: Pt initially ambulating more slowly into slowly, but speed gradually incr with incr distances.   FUNCTIONAL TESTS:  5 times sit to stand: 15.91 seconds with no UE support  Timed up and go (TUG): 12.09 seconds with no AD  10 meter walk test: 16.09 seconds = 2.04 ft/sec    M-CTSIB  Condition 1: Firm Surface, EO 30 Sec, Normal Sway  Condition 2: Firm Surface, EC 30 Sec, Normal Sway  Condition 3: Foam Surface, EO 30 Sec, Normal Sway  Condition 4: Foam Surface, EC 25 Sec, Mild Sway     Pt reporting mild nausea after testing, BP was WNL, pt declined ginger ale or water. Pt felt better after seated rest break.    TODAY'S TREATMENT:  N/A during eval.     PATIENT EDUCATION: Education details: Clinical findings, POC, letting PCP know about lack of appetite.  Person educated: Patient and Spouse Education method: Explanation Education comprehension: verbalized understanding  HOME EXERCISE PROGRAM: Will provide at next session.   GOALS: Goals reviewed with patient? Yes  SHORT TERM GOALS: Target date: 03/25/2021    Pt will be independent with initial HEP in order to build upon functional gains made in therapy. Baseline: Goal status: IN PROGRESS 03/11/22  2.  Pt will undergo further assessment of FGA with LTG written. Baseline: 26/30 03/11/22 Goal status: MET 03/11/22    LONG TERM GOALS: Target date: 04/08/2022   Pt will be independent with final HEP in order to build upon functional gains made in therapy. Baseline:  Goal status: IN PROGRESS 03/11/22  2.  Patient to score 28/30 on FGA in order to improve functional balance.  Baseline: 26 Goal status: IN PROGRESS  03/11/22  3.  Pt will improve gait speed with no to at least 2.7 ft/sec in order to demo improved community mobility.  Baseline: 16.09 seconds = 2.04 ft/sec Goal status: MET 03/11/22  4.  Pt will improve 5x sit<>stand to less than or equal to 13 sec to demonstrate improved functional strength and transfer efficiency.  Baseline: 15.91 seconds with no UE support; 11.7 sec 03/11/22 Goal status: MET 03/11/22  5.  Pt will improve 5x sit<>stand to less than or equal to 13 sec with good stability and eccentric control to demonstrate improved functional strength and transfer efficiency.  Baseline: 11.7 sec with posterior LOB 03/11/22 Goal status: IN PROGRESS 03/11/22  6.  Pt to demonstrate B LE strength 4+/5.  Baseline: see above Goal status: IN PROGRESS 03/11/22    ASSESSMENT:  CLINICAL IMPRESSION: Patient arrived to session after hiatus from PT for re-evaluation s/p CVA on 01/07/22. Patient is not ambulating with AD and reports no concerns about balance or gait. However, has desire to return to driving. Patient today presenting with B hip and slight ankle weakness, imbalance and decreased control with transfers, decreased gait speed, and gait deviations. Would benefit from additional skilled PT services 1x/week for 4 weeks to address remaining goals.    OBJECTIVE IMPAIRMENTS: Abnormal gait, decreased activity tolerance, decreased balance, decreased cognition, decreased coordination, decreased endurance, decreased strength, and postural dysfunction.   ACTIVITY LIMITATIONS: carrying, lifting, bending, stairs, transfers, bathing, dressing, and locomotion level  PARTICIPATION LIMITATIONS: meal prep, driving, and community activity  PERSONAL FACTORS: Age, Behavior pattern, Past/current experiences, Time since onset of injury/illness/exacerbation, and 3+ comorbidities: anxiety, depression, TIA, cerebral aneurysm status post coiling, HTN, gastritis, dysphagia, esophageal web, GERD, partial  colectomy, IBS, CVA  are also affecting patient's functional outcome.   REHAB POTENTIAL: Good  CLINICAL DECISION MAKING: Stable/uncomplicated  EVALUATION COMPLEXITY: Low  PLAN:  PT FREQUENCY: 1x/week  PT DURATION: 8 weeks  PLANNED INTERVENTIONS: Therapeutic exercises, Therapeutic activity, Neuromuscular re-education, Balance training, Gait training, Patient/Family education, Self Care, Stair training, Vestibular training, DME instructions, and Re-evaluation  PLAN FOR NEXT SESSION: reassess HEP; progress high level balance, STS transfers, hip strengthening     Janene Harvey, PT, DPT 03/29/22 8:19 AM  [facility] at [facility] [address] [place], [place] [address] Phone # [phone] Fax # [phone]

## 2022-03-29 NOTE — Therapy (Signed)
[facility] [address] [place], [place], [address] Phone: [phone]   Fax:  [phone]  Patient Details  Name: Cassandra Alexander MRN: [id number] Date of Birth: 1945/05/11 Referring Provider:  Collene Leyden, MD  Encounter Date: 03/29/2022  SPEECH THERAPY DISCHARGE SUMMARY  Visits from Start of Care: 3  Current functional level related to goals / functional outcomes: Pt made minimal gains in three therapy sessions. On 03-29-22 she called and cancelled all remaining ST appointments.  SLP has concern for pt's complex language skills, and her cognitive ability. Goals and Impression from last attended session are below, italicized:  SHORT TERM GOALS: Target date: 02/25/2022   Pt will participate in clinical swallow evaluation in first 1-2 ST sessions (MBSS may be recommended to further evaluate pharyngeal function and assess for aspiration if s/sx exhibited/reported) Baseline: Goal status: Met   2.  Pt will ID and attempt to self-correct anomia with trained strategies on structured speech tasks given occasional min A over 2 sessions  Baseline:  Goal status: Ongoing   3. Pt will ID and attempt to self-correct anomia with trained strategies during short conversations given occasional min A over 2 sessions  Baseline:  Goal status: Ongoing   LONG TERM GOALS: Target date: 03/25/2022   Pt will consume safest and least restrictive diet with reduced s/sx of aspiration with use of trained strategies given rare min A Baseline:  Goal status: Ongoing   2.  Pt will participate in 3 outside communication exchanges with friends/family with successful carryover of anomia strategies given rare min A from husband Baseline:  Goal status: Ongoing   3.  Pt will utilize cognitive compensations as needed to optimize successful completion of targeted household tasks given occasional min A from husband Baseline:  Goal status: Ongoing   ASSESSMENT:   CLINICAL IMPRESSION: Patient is a 77 y.o. female who was seen today after CVA on 01/06/22. Some cognitive deficits reported prior to most recent stroke and pt/husband continue to state pt's cognition is at baseline from prior to CVA on 01/06/22. Today, rduced attention and memory seen in session. [name] continues to report intermittent anomia impacting message cohesion and pt confidence participating in more complex discourse. A  MBS recommended after previous visit and pt deciding on whether she wants this test or not. Pt would cont to benefit from skilled ST intervention to optimize communication effectiveness and safety during swallowing.    OBJECTIVE IMPAIRMENTS: include memory, aphasia, and dysphagia. These impairments are limiting patient from household responsibilities, effectively communicating at home and in community, and safety when swallowing. Factors affecting potential to achieve goals and functional outcome are cooperation/participation level and previous level of function. Patient will benefit from skilled SLP services to address above impairments and improve overall function.   Remaining deficits: All deficits remain.   Education / Equipment: Compensations for deficits   Patient agrees to discharge. Patient goals were not met. Patient is being discharged due to the patient's request.    [facility], CCC-SLP 03/29/2022, 10:09 PM  [facility] [address], [place], [place], [address] Phone: [phone]   Fax:  [phone]

## 2022-03-30 ENCOUNTER — Ambulatory Visit: Payer: PPO | Admitting: Physical Therapy

## 2022-03-30 ENCOUNTER — Encounter: Payer: PPO | Admitting: Occupational Therapy

## 2022-03-30 ENCOUNTER — Ambulatory Visit: Payer: PPO

## 2022-04-01 ENCOUNTER — Encounter: Payer: PPO | Admitting: Occupational Therapy

## 2022-04-06 ENCOUNTER — Ambulatory Visit: Payer: PPO | Admitting: Physical Therapy

## 2022-04-06 ENCOUNTER — Ambulatory Visit: Payer: PPO

## 2022-04-06 ENCOUNTER — Encounter: Payer: PPO | Admitting: Occupational Therapy

## 2022-04-07 ENCOUNTER — Telehealth: Payer: Self-pay | Admitting: Neurology

## 2022-04-07 NOTE — Telephone Encounter (Signed)
OCEANIC STROKE TRIAL VISIT TELEPHONE NOTE ;  I spoke today over the phone with the patient's husband on 04/05/2022.  He informed me that the patient is doing better making gradual improvement.  Patient is currently not on the [facility]  stroke study medication since 01/13/2022.  I discussed with him possibility of restarting the medication but he and his wife declined this at the present time.  They agree for patient to stay in the study but they want permanent discontinuation of the study medication.  They are okay with calling them over the phone and checking on her  health.  Antony Contras, MD

## 2022-04-08 ENCOUNTER — Encounter: Payer: PPO | Admitting: Occupational Therapy

## 2022-04-12 NOTE — Progress Notes (Signed)
Carelink Summary Report / Loop Recorder

## 2022-04-16 LAB — CUP PACEART REMOTE DEVICE CHECK
Date Time Interrogation Session: 20240201230555
Implantable Pulse Generator Implant Date: 20230918

## 2022-04-19 ENCOUNTER — Ambulatory Visit: Payer: PPO | Attending: Cardiology

## 2022-04-19 ENCOUNTER — Other Ambulatory Visit: Payer: Self-pay

## 2022-04-19 DIAGNOSIS — I639 Cerebral infarction, unspecified: Secondary | ICD-10-CM

## 2022-04-19 NOTE — Patient Outreach (Signed)
First telephone outreach attempt to obtain mRS. No answer. Left message for returned call.  Patirica Longshore [facility] Management Assistant [phone]  

## 2022-04-21 ENCOUNTER — Other Ambulatory Visit: Payer: Self-pay

## 2022-04-23 ENCOUNTER — Other Ambulatory Visit: Payer: Self-pay

## 2022-04-23 NOTE — Patient Outreach (Signed)
3 outreach attempts were completed to obtain mRs. mRs could not be obtained because patient never returned my calls. mRs=7    [place] Management Assistant [phone]

## 2022-04-29 DIAGNOSIS — F320 Major depressive disorder, single episode, mild: Secondary | ICD-10-CM | POA: Diagnosis not present

## 2022-04-29 DIAGNOSIS — I700 Atherosclerosis of aorta: Secondary | ICD-10-CM | POA: Diagnosis not present

## 2022-04-29 DIAGNOSIS — I10 Essential (primary) hypertension: Secondary | ICD-10-CM | POA: Diagnosis not present

## 2022-04-29 DIAGNOSIS — Z8673 Personal history of transient ischemic attack (TIA), and cerebral infarction without residual deficits: Secondary | ICD-10-CM | POA: Diagnosis not present

## 2022-04-29 DIAGNOSIS — F411 Generalized anxiety disorder: Secondary | ICD-10-CM | POA: Diagnosis not present

## 2022-04-29 DIAGNOSIS — E559 Vitamin D deficiency, unspecified: Secondary | ICD-10-CM | POA: Diagnosis not present

## 2022-04-29 DIAGNOSIS — M150 Primary generalized (osteo)arthritis: Secondary | ICD-10-CM | POA: Diagnosis not present

## 2022-05-24 ENCOUNTER — Ambulatory Visit: Payer: PPO

## 2022-05-24 DIAGNOSIS — I639 Cerebral infarction, unspecified: Secondary | ICD-10-CM

## 2022-05-24 LAB — CUP PACEART REMOTE DEVICE CHECK
Date Time Interrogation Session: 20240310230127
Implantable Pulse Generator Implant Date: 20230918

## 2022-05-28 NOTE — Progress Notes (Signed)
Carelink Summary Report / Loop Recorder 

## 2022-06-28 ENCOUNTER — Ambulatory Visit (INDEPENDENT_AMBULATORY_CARE_PROVIDER_SITE_OTHER): Payer: PPO

## 2022-06-28 DIAGNOSIS — I639 Cerebral infarction, unspecified: Secondary | ICD-10-CM

## 2022-06-28 LAB — CUP PACEART REMOTE DEVICE CHECK
Date Time Interrogation Session: 20240414230907
Implantable Pulse Generator Implant Date: 20230918

## 2022-06-30 NOTE — Progress Notes (Signed)
Carelink Summary Report / Loop Recorder 

## 2022-07-29 LAB — CUP PACEART REMOTE DEVICE CHECK
Date Time Interrogation Session: 20240515230259
Implantable Pulse Generator Implant Date: 20230918

## 2022-07-30 NOTE — Progress Notes (Signed)
Carelink Summary Report / Loop Recorder 

## 2022-08-02 ENCOUNTER — Telehealth: Payer: Self-pay | Admitting: Adult Health

## 2022-08-02 ENCOUNTER — Ambulatory Visit (INDEPENDENT_AMBULATORY_CARE_PROVIDER_SITE_OTHER): Payer: PPO

## 2022-08-02 ENCOUNTER — Telehealth: Payer: Self-pay | Admitting: Cardiology

## 2022-08-02 DIAGNOSIS — I639 Cerebral infarction, unspecified: Secondary | ICD-10-CM | POA: Diagnosis not present

## 2022-08-02 NOTE — Telephone Encounter (Signed)
Dr Pearlean Brownie can you review this? Looks like you may have seen this patient in a trial recently. The patient hasn't seen Aundra Millet since the patient's last hospitalization so I don't know what to tell the patient's primary care office.

## 2022-08-02 NOTE — Telephone Encounter (Signed)
Pt c/o medication issue:  1. Name of Medication:   clopidogrel (PLAVIX) 75 MG tablet  aspirin 81 MG chewable tablet   2. How are you currently taking this medication (dosage and times per day)?   3. Are you having a reaction (difficulty breathing--STAT)?   4. What is your medication issue?  [facility] is requesting call back to go over these medications and to see if patient is to be taking these medications together.

## 2022-08-02 NOTE — Telephone Encounter (Signed)
Informed Cassandra Alexander, with [facility], that they will need to reach out to neurology to discuss. Explained that Dr. 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 Informed that we have not prescribed any medication for this pt. Cassandra Alexander appreciates the follow up call and information. She will reach out to neuro to discuss further.

## 2022-08-02 NOTE — Telephone Encounter (Signed)
Marylene Land from [facility] called needing to know if the pt is supposed to be taking Asprin and Plavix together. Please advise.

## 2022-08-04 ENCOUNTER — Telehealth: Payer: Self-pay | Admitting: Adult Health

## 2022-08-04 NOTE — Telephone Encounter (Signed)
There is already a phone call about this that I routed to Dr Pearlean Brownie to see what he advises.

## 2022-08-04 NOTE — Telephone Encounter (Signed)
Marylene Land, RN @ Dr Hamrick's office is asking if pt should continue taking the  aspirin 81 MG chewable tablet  and clopidogrel (PLAVIX) 75 MG tablet together, please call her at [phone]

## 2022-08-04 NOTE — Telephone Encounter (Signed)
Marylene Land called again today. I have spoken with Dr Pearlean Brownie and he is asking for more information and what their concerns are. I returned Angela's call and LVM with office number asking for call back.

## 2022-08-05 ENCOUNTER — Other Ambulatory Visit: Payer: Self-pay | Admitting: Neurology

## 2022-08-05 NOTE — Telephone Encounter (Signed)
Message directly from Dr Pearlean Brownie today at 12:46 PM  I called and spoke to the patient's husband.  Patient presently is taking both aspirin and Plavix.  We discussed stopping 1 of these agents.  Since the patient had a second stroke while taking aspirin I recommend she stay on Plavix alone and stop aspirin now.  He voiced understanding.  The patient and husband do not want to go back on the [facility] stroke trial medication

## 2022-08-05 NOTE — Telephone Encounter (Signed)
Cassandra Alexander at [facility] returned my call. She said their pharmacist with their team is asking for clarification as to whether or not patient should remain on both Aspirin and Plavix together. Patient was admitted 01/06/22-01/08/22 for stroke and was discharged on asa and Plavix (see screen shot of hospital discharge summary below). 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 She saw vascular surgery in the office November 2023 and then continued aspirin and Plavix and is still on both now. Dr Lewie Chamber has continued to prescribe the plavix for the patient. They are asking neurology if patient should still be on both aspirin and Plavix. Per [facility] Sprang, they have no knowledge of any other strokes besides the strokes last July 2023 and October 2023.   Dr Pearlean Brownie please review patient's chart and let us know. Patient may have seen you in research since that hospitalization but we have not had an office visit with her since then.

## 2022-08-05 NOTE — Telephone Encounter (Signed)
I called Marylene Land at [facility] and LVM at [phone] stating Dr Pearlean Brownie spoke directly with the patient's husband just a few minutes ago and they discussed stopping one of the agents.  Per Dr. Pearlean Brownie, since the patient had a second stroke while on aspirin, he recommends the patient continue therapy on Plavix alone so the patient will stop aspirin immediately and continue Plavix. Dr Pearlean Brownie stated the husband is aware.  I left our office number for Marylene Land to call back if needed.

## 2022-08-05 NOTE — Telephone Encounter (Signed)
I called Cassandra Alexander again this morning at [phone] and LVM asking for call back. When she calls back, Dr Pearlean Brownie is wanting to know what their concerns are about the aspirin and plavix and he wants more information regarding their question.

## 2022-08-06 ENCOUNTER — Telehealth: Payer: Self-pay | Admitting: Adult Health

## 2022-08-06 NOTE — Telephone Encounter (Signed)
Cassandra Alexander from [facility] called needing to speak to the RN or MD regarding the pt's Asprin and Plavix. There is a confusion with which one she is to be taking after her second stroke. Pt states that her and her husband were informed by Dr. Pearlean Brownie to stop one and continue the other and they need to clarify which one to continue.

## 2022-08-10 DIAGNOSIS — M150 Primary generalized (osteo)arthritis: Secondary | ICD-10-CM | POA: Diagnosis not present

## 2022-08-10 DIAGNOSIS — K219 Gastro-esophageal reflux disease without esophagitis: Secondary | ICD-10-CM | POA: Diagnosis not present

## 2022-08-10 DIAGNOSIS — I10 Essential (primary) hypertension: Secondary | ICD-10-CM | POA: Diagnosis not present

## 2022-08-10 DIAGNOSIS — F320 Major depressive disorder, single episode, mild: Secondary | ICD-10-CM | POA: Diagnosis not present

## 2022-08-10 NOTE — Telephone Encounter (Signed)
I received a call back from [place] at [facility]. Per Dr. Pearlean Brownie on 08/05/2022 since the patient had a second stroke while on aspirin, he recommends the patient continue therapy on Plavix alone. Patient will stop aspirin immediately and continue Plavix. Ladona Ridgel stated the patient's medication list was updated by pharmacist. She verbalized understanding and expressed appreciation for the call.

## 2022-08-10 NOTE — Telephone Encounter (Signed)
Cassandra Alexander was unavailable. I left a message on her voicemail for a return call.

## 2022-08-27 NOTE — Progress Notes (Signed)
Carelink Summary Report / Loop Recorder 

## 2022-09-06 ENCOUNTER — Ambulatory Visit (INDEPENDENT_AMBULATORY_CARE_PROVIDER_SITE_OTHER): Payer: PPO

## 2022-09-06 DIAGNOSIS — I639 Cerebral infarction, unspecified: Secondary | ICD-10-CM

## 2022-09-06 LAB — CUP PACEART REMOTE DEVICE CHECK
Date Time Interrogation Session: 20240623230635
Implantable Pulse Generator Implant Date: 20230918

## 2022-09-23 NOTE — Progress Notes (Signed)
Carelink Summary Report / Loop Recorder 

## 2022-10-07 ENCOUNTER — Ambulatory Visit (INDEPENDENT_AMBULATORY_CARE_PROVIDER_SITE_OTHER): Payer: PPO

## 2022-10-07 DIAGNOSIS — I639 Cerebral infarction, unspecified: Secondary | ICD-10-CM

## 2022-10-07 LAB — CUP PACEART REMOTE DEVICE CHECK
Date Time Interrogation Session: 20240724230525
Implantable Pulse Generator Implant Date: 20230918

## 2022-10-15 NOTE — Progress Notes (Signed)
Carelink Summary Report / Loop Recorder 

## 2022-11-09 ENCOUNTER — Ambulatory Visit (INDEPENDENT_AMBULATORY_CARE_PROVIDER_SITE_OTHER): Payer: PPO

## 2022-11-09 DIAGNOSIS — I639 Cerebral infarction, unspecified: Secondary | ICD-10-CM | POA: Diagnosis not present

## 2022-11-09 LAB — CUP PACEART REMOTE DEVICE CHECK
Date Time Interrogation Session: 20240826230049
Implantable Pulse Generator Implant Date: 20230918

## 2022-11-11 DIAGNOSIS — Z9181 History of falling: Secondary | ICD-10-CM | POA: Diagnosis not present

## 2022-11-11 DIAGNOSIS — E559 Vitamin D deficiency, unspecified: Secondary | ICD-10-CM | POA: Diagnosis not present

## 2022-11-11 DIAGNOSIS — I10 Essential (primary) hypertension: Secondary | ICD-10-CM | POA: Diagnosis not present

## 2022-11-11 DIAGNOSIS — Z0000 Encounter for general adult medical examination without abnormal findings: Secondary | ICD-10-CM | POA: Diagnosis not present

## 2022-11-11 DIAGNOSIS — F320 Major depressive disorder, single episode, mild: Secondary | ICD-10-CM | POA: Diagnosis not present

## 2022-11-11 DIAGNOSIS — K5100 Ulcerative (chronic) pancolitis without complications: Secondary | ICD-10-CM | POA: Diagnosis not present

## 2022-11-11 DIAGNOSIS — Z8673 Personal history of transient ischemic attack (TIA), and cerebral infarction without residual deficits: Secondary | ICD-10-CM | POA: Diagnosis not present

## 2022-11-11 DIAGNOSIS — M150 Primary generalized (osteo)arthritis: Secondary | ICD-10-CM | POA: Diagnosis not present

## 2022-11-11 DIAGNOSIS — I700 Atherosclerosis of aorta: Secondary | ICD-10-CM | POA: Diagnosis not present

## 2022-11-11 DIAGNOSIS — Z23 Encounter for immunization: Secondary | ICD-10-CM | POA: Diagnosis not present

## 2022-11-11 DIAGNOSIS — F411 Generalized anxiety disorder: Secondary | ICD-10-CM | POA: Diagnosis not present

## 2022-11-11 DIAGNOSIS — R413 Other amnesia: Secondary | ICD-10-CM | POA: Diagnosis not present

## 2022-11-22 NOTE — Progress Notes (Signed)
Carelink Summary Report / Loop Recorder 

## 2022-12-13 ENCOUNTER — Ambulatory Visit (INDEPENDENT_AMBULATORY_CARE_PROVIDER_SITE_OTHER): Payer: PPO

## 2022-12-13 DIAGNOSIS — I639 Cerebral infarction, unspecified: Secondary | ICD-10-CM

## 2022-12-13 LAB — CUP PACEART REMOTE DEVICE CHECK
Date Time Interrogation Session: 20240928230151
Implantable Pulse Generator Implant Date: 20230918

## 2022-12-22 DIAGNOSIS — K219 Gastro-esophageal reflux disease without esophagitis: Secondary | ICD-10-CM | POA: Diagnosis not present

## 2022-12-22 DIAGNOSIS — Z8673 Personal history of transient ischemic attack (TIA), and cerebral infarction without residual deficits: Secondary | ICD-10-CM | POA: Diagnosis not present

## 2022-12-22 DIAGNOSIS — K5100 Ulcerative (chronic) pancolitis without complications: Secondary | ICD-10-CM | POA: Diagnosis not present

## 2022-12-22 DIAGNOSIS — Z9049 Acquired absence of other specified parts of digestive tract: Secondary | ICD-10-CM | POA: Diagnosis not present

## 2022-12-27 NOTE — Progress Notes (Signed)
Carelink Summary Report / Loop Recorder 

## 2023-01-14 LAB — CUP PACEART REMOTE DEVICE CHECK
Date Time Interrogation Session: 20241031230858
Implantable Pulse Generator Implant Date: 20230918

## 2023-01-17 ENCOUNTER — Ambulatory Visit (INDEPENDENT_AMBULATORY_CARE_PROVIDER_SITE_OTHER): Payer: PPO

## 2023-01-17 DIAGNOSIS — I639 Cerebral infarction, unspecified: Secondary | ICD-10-CM | POA: Diagnosis not present

## 2023-02-01 NOTE — Progress Notes (Signed)
Carelink Summary Report / Loop Recorder 

## 2023-02-21 ENCOUNTER — Ambulatory Visit (INDEPENDENT_AMBULATORY_CARE_PROVIDER_SITE_OTHER): Payer: PPO

## 2023-02-21 DIAGNOSIS — I639 Cerebral infarction, unspecified: Secondary | ICD-10-CM | POA: Diagnosis not present

## 2023-02-21 DIAGNOSIS — Z1231 Encounter for screening mammogram for malignant neoplasm of breast: Secondary | ICD-10-CM | POA: Diagnosis not present

## 2023-02-21 LAB — CUP PACEART REMOTE DEVICE CHECK
Date Time Interrogation Session: 20241208231220
Implantable Pulse Generator Implant Date: 20230918

## 2023-03-28 ENCOUNTER — Ambulatory Visit: Payer: PPO

## 2023-03-28 DIAGNOSIS — I639 Cerebral infarction, unspecified: Secondary | ICD-10-CM | POA: Diagnosis not present

## 2023-03-28 LAB — CUP PACEART REMOTE DEVICE CHECK
Date Time Interrogation Session: 20250112231724
Implantable Pulse Generator Implant Date: 20230918

## 2023-05-02 ENCOUNTER — Ambulatory Visit (INDEPENDENT_AMBULATORY_CARE_PROVIDER_SITE_OTHER): Payer: PPO

## 2023-05-02 DIAGNOSIS — I639 Cerebral infarction, unspecified: Secondary | ICD-10-CM

## 2023-05-03 LAB — CUP PACEART REMOTE DEVICE CHECK
Date Time Interrogation Session: 20250216231432
Implantable Pulse Generator Implant Date: 20230918

## 2023-05-05 NOTE — Progress Notes (Signed)
 Carelink Summary Report / Loop Recorder

## 2023-06-06 LAB — CUP PACEART REMOTE DEVICE CHECK
Date Time Interrogation Session: 20250323231301
Implantable Pulse Generator Implant Date: 20230918

## 2023-06-07 NOTE — Progress Notes (Signed)
 Carelink Summary Report / Loop Recorder

## 2023-06-07 NOTE — Addendum Note (Signed)
 Addended by: Geralyn Flash D on: 06/07/2023 01:40 PM   Modules accepted: Orders

## 2023-06-13 ENCOUNTER — Encounter: Payer: Self-pay | Admitting: Cardiology

## 2023-06-13 ENCOUNTER — Ambulatory Visit: Payer: PPO | Attending: Cardiology | Admitting: Cardiology

## 2023-06-13 VITALS — BP 120/74 | HR 68 | Ht 59.0 in | Wt 115.6 lb

## 2023-06-13 DIAGNOSIS — I639 Cerebral infarction, unspecified: Secondary | ICD-10-CM | POA: Diagnosis not present

## 2023-06-13 NOTE — Patient Instructions (Signed)
Medication Instructions:  Your physician recommends that you continue on your current medications as directed. Please refer to the Current Medication list given to you today.  Labwork: None ordered.  Testing/Procedures: None ordered.  Follow-Up:  Your physician wants you to follow-up as needed with Dr. Camnitz.    Implantable Loop Recorder Removal, Care After This sheet gives you information about how to care for yourself after your procedure. Your health care provider may also give you more specific instructions. If you have problems or questions, contact your health care provider. What can I expect after the procedure? After the procedure, it is common to have: Soreness or discomfort near the incision. Some swelling or bruising near the incision.  Follow these instructions at home: Incision care  Monitor your cardiac device site for redness, swelling, and drainage. Call the device clinic at [phone] if you experience these symptoms or fever/chills.  Keep the large square bandage on your site for 24 hours and then you may remove it yourself. Keep the steri-strips underneath in place.   You may shower after 72 hours / 3 days from your procedure with the steri-strips in place. They will usually fall off on their own, or may be removed after 10 days. Pat dry.   Avoid lotions, ointments, or perfumes over your incision until it is well-healed.  Please do not submerge in water until your site is completely healed.   If your wound site starts to bleed apply pressure.       If you have any questions/concerns please call the device clinic at [phone].  Activity  Return to your normal activities.  Contact a health care provider if: You have redness, swelling, or pain around your incision. You have a fever.  

## 2023-06-13 NOTE — Progress Notes (Signed)
 Electrophysiology Office Note:   Date:  06/13/2023  ID:  SHAWNTEE Alexander, DOB [birth year], MRN [id number]  Primary Cardiologist: None Primary Heart Failure: None Electrophysiologist: Darrall Strey Jorja Loa, MD      History of Present Illness:   Cassandra Alexander is a 78 y.o. female with h/o CVA, cerebral aortic aneurysm post coiling seen today for routine electrophysiology followup.   Since last being seen in our clinic the patient reports doing well.  She has no chest pain or shortness of breath.  She has an ILR which has been implanted.  She is at this point did not had any episodes of atrial fibrillation.  She wishes the ILR to be explanted..  she denies chest pain, palpitations, dyspnea, PND, orthopnea, nausea, vomiting, dizziness, syncope, edema, weight gain, or early satiety.   Review of systems complete and found to be negative unless listed in HPI.   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/30/2021 for Cryptogenic Stroke  EP Information / Studies Reviewed:        Risk Assessment/Calculations:             Physical Exam:   VS:  BP 120/74 (BP Location: Left Arm, Patient Position: Sitting, Cuff Size: Normal)   Pulse 68   Ht 4\' 11"  (1.499 m)   Wt 115 lb 9.6 oz (52.4 kg)   SpO2 95%   BMI 23.35 kg/m    Wt Readings from Last 3 Encounters:  06/13/23 115 lb 9.6 oz (52.4 kg)  01/29/22 104 lb 1.6 oz (47.2 kg)  01/14/22 110 lb 7.2 oz (50.1 kg)     GEN: Well nourished, well developed in no acute distress NECK: No JVD; No carotid bruits CARDIAC: Regular rate and rhythm, no murmurs, rubs, gallops RESPIRATORY:  Clear to auscultation without rales, wheezing or rhonchi  ABDOMEN: Soft, non-tender, non-distended EXTREMITIES:  No edema; No deformity   ILR Interrogation- reviewed in detail today,  See [facility] report  ASSESSMENT AND PLAN:    Cryptogenic Stroke s/p Medtronic Loop recorder Normal device function See [facility] report Patient is interested in ILR explant.  She  has had the device since 2023 without any episodes of atrial fibrillation.  Recent benefits of explant were discussed.  Risk include bleeding and infection.  She understands these risks and is agreed to the procedure.     Follow up with Dr. Elberta Fortis  PRN   Signed, [name] Jorja Loa, MD   SURGEON:  Loman Brooklyn, MD     PREPROCEDURE DIAGNOSIS: Cryptogenic stroke    POSTPROCEDURE DIAGNOSIS: Cryptogenic stroke     PROCEDURES:   1. Implantable loop recorder implantation    INTRODUCTION:  CINTHIA Alexander is a 78 y.o. female with a history of unexplained stroke who presents today for implantable loop explant.  she has had a LINQ monitor.  LINQ is now at [facility] and wishes the monitor explanted.     DESCRIPTION OF PROCEDURE:  Informed written consent was obtained, and the patient was brought to the electrophysiology lab in a fasting state.  The patient required no sedation for the procedure today.  The patients left chest was therefore prepped and draped in the usual sterile fashion by the EP lab staff. The skin overlying the left parasternal region was infiltrated with lidocaine for local analgesia.  A 0.5-cm incision was made over the left parasternal region over the existing LINQ monitor.  Using a combination of sharp and blount dissection, the LINQ monitor was removed from the pocket.  Steri-  Strips and a sterile dressing were then applied.  There were no early apparent complications.     CONCLUSIONS:   1. Successful explant of a Medtronic Reveal LINQ implantable loop recorder for cryptogenic stroke  2. No early apparent complications.

## 2023-07-06 ENCOUNTER — Encounter: Payer: Self-pay | Admitting: Emergency Medicine

## 2023-07-06 ENCOUNTER — Ambulatory Visit: Admitting: Emergency Medicine

## 2023-07-06 VITALS — BP 126/70 | HR 68 | Ht 60.0 in | Wt 116.2 lb

## 2023-07-06 DIAGNOSIS — Z87891 Personal history of nicotine dependence: Secondary | ICD-10-CM

## 2023-07-06 DIAGNOSIS — R911 Solitary pulmonary nodule: Secondary | ICD-10-CM

## 2023-07-06 NOTE — Assessment & Plan Note (Signed)
 Reviewed her imaging with her today.  She feels well, is asymptomatic.  Her left upper lobe nodule was originally identified on CT chest 05/01/2021 and then almost fully resolved on subsequent PET scan.  Her left lower lobe nodule is stable between 05/01/2021 and 01/13/2022 and is partially calcified.  Suspect benign etiology.  We discussed a possible repeat CT scan of the chest to look for interval change, stability.  She would like to defer for now.  We will follow-up in 1 year, determine whether she is having any respiratory symptoms.  We could revisit reimaging at that time or if she develops symptoms.

## 2023-07-06 NOTE — Patient Instructions (Addendum)
 We reviewed your scans of the chest today including an abdominal scan January 2023, PET scan March 2023, abdominal scan 01/2022.  A left upper lobe nodule largely resolved on your PET scan.  There is a stable small pulmonary nodule at the bottom of the left lung with benign characteristics.  This is very low risk for malignancy based on its appearance and its behavior over time.  I do not think you need any other workup for the left lower lobe nodule at this time. Please notify us  if you have any changes in your breathing, any new respiratory symptoms so we can see you in the office to evaluate.

## 2023-07-06 NOTE — Progress Notes (Signed)
 Subjective:    Patient ID: Cassandra Alexander, female    DOB: [birth year], 78 y.o.   MRN: [id number]  HPI 78 year old woman, former smoker (30 pack years) with a history of cerebral aneurysm coiling, CVA, hyperlipidemia, IBS with diverticular disease.  I seen her in the past for a 1.4 x 1.2 anterior left upper lobe rounded pulmonary nodule that decreased in size on a PET scan done 06/08/2021 consistent with a benign process. She denies any resp issues, dyspnea.   Her most recent CT scan of the abdomen pelvis 01/13/2022 did show stability of a left basilar pulmonary nodule, does not image the area in question in the left upper lobe.   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   TIA (transient ischemic attack) 07/07/2015   Wears glasses      Family History  Problem Relation Age of Onset   Hypertension Mother    Stroke Mother    Osteoarthritis Father      Social History   Socioeconomic History   Marital status: Married    Spouse name: Not on file   Number of children: Not on file   Years of education: Not on file   Highest education level: Not on file  Occupational History   Not on file  Tobacco Use   Smoking status: Former    Current packs/day: 0.00    Types: Cigarettes    Start date: 03/15/1970    Quit date: 03/15/1990    Years since quitting: 33.3    Passive exposure: Never   Smokeless tobacco: Never  Vaping Use   Vaping status:  Never Used  Substance and Sexual Activity   Alcohol use: Not Currently   Drug use: Never   Sexual activity: Not on file  Other Topics Concern   Not on file  Social History Narrative   Not on file   Social Drivers of Health   Financial Resource Strain: Not on file  Food Insecurity: No Food Insecurity (01/16/2022)   Hunger Vital Sign    Worried About Running Out of Food in the Last Year: Never true    Ran Out of Food in the Last Year: Never true  Transportation Needs: No Transportation Needs (01/16/2022)   PRAPARE - Administrator, Civil Service (Medical): No    Lack of Transportation (Non-Medical): No  Physical Activity: Not on file  Stress: Not on file  Social Connections: Not on file  Intimate Partner Violence: Not At Risk (01/16/2022)   Humiliation, Afraid, Rape, and Kick questionnaire    Fear of Current or Ex-Partner: No    Emotionally Abused: No    Physically Abused: No    Sexually Abused: No     Allergies  Allergen Reactions   Cortisone Swelling    Facial swelling Headache   Penicillins Rash    Childhood reaction Has  patient had a PCN reaction causing immediate rash, facial/tongue/throat swelling, SOB or lightheadedness with hypotension: Yes Has patient had a PCN reaction causing severe rash involving mucus membranes or skin necrosis: No Has patient had a PCN reaction that required hospitalization No Has patient had a PCN reaction occurring within the last 10 years: No If all of the above answers are "NO", then may proceed with Cephalosporin use.      Outpatient Medications Prior to Visit  Medication Sig Dispense Refill   ALPRAZolam  (XANAX ) 1 MG tablet Take 0.5 mg by mouth at bedtime.     atorvastatin  (LIPITOR ) 80 MG tablet Take 80 mg by mouth daily.     citalopram  (CELEXA ) 10 MG tablet Take 10 mg by mouth at bedtime.     clopidogrel  (PLAVIX ) 75 MG tablet Take 1 tablet (75 mg total) by mouth daily. 30 tablet 2   imipramine  (TOFRANIL ) 50 MG tablet Take  2 tablets by mouth at bedtime.     lisinopril  (ZESTRIL ) 10 MG tablet Take 10 mg by mouth daily.     mesalamine  (LIALDA ) 1.2 g EC tablet Take 2.4 g by mouth daily with breakfast.     nicotine  polacrilex (NICORETTE ) 2 MG gum Take 2 mg by mouth as needed for smoking cessation.     pantoprazole  (PROTONIX ) 20 MG tablet Take 20 mg by mouth daily.     Vitamin D, Ergocalciferol, (DRISDOL) 1.25 MG (50000 UNIT) CAPS capsule Take 50,000 Units by mouth once a week.     atorvastatin  (LIPITOR ) 80 MG tablet Take 1 tablet (80 mg total) by mouth daily. 30 tablet 11   No facility-administered medications prior to visit.         Objective:   Physical Exam  Vitals:   07/06/23 0933  BP: 126/70  Pulse: 68  SpO2: 99%  Weight: 116 lb 3.2 oz (52.7 kg)  Height: 5' (1.524 m)   Gen: Pleasant, well-nourished, in no distress,  normal affect  ENT: No lesions,  mouth clear,  oropharynx clear, no postnasal drip  Neck: No JVD, no stridor  Lungs: No use of accessory muscles, no crackles or wheezing on normal respiration, no wheeze on forced expiration  Cardiovascular: RRR, heart sounds normal, no murmur or gallops, no peripheral edema  Musculoskeletal: No deformities, no cyanosis or clubbing  Neuro: alert, awake, non focal  Skin: Warm, no lesions or rash    Assessment & Plan:   Pulmonary nodule 1 cm or greater in diameter Reviewed her imaging with her today.  She feels well, is asymptomatic.  Her left upper lobe nodule was originally identified on CT chest 05/01/2021 and then almost fully resolved on subsequent PET scan.  Her left lower lobe nodule is stable between 05/01/2021 and 01/13/2022 and is partially calcified.  Suspect benign etiology.  We discussed a possible repeat CT scan of the chest to look for interval change, stability.  She would like to defer for now.  We will follow-up in 1 year, determine whether she is having any respiratory symptoms.  We could revisit reimaging at that time or if she  develops symptoms.    Racheal Buddle, MD, PhD 07/06/2023, 2:33 PM [facility] [phone] or if no answer before 7:00PM call [phone] For any issues after 7:00PM please call eLink [phone]
